# Patient Record
Sex: Male | Born: 1960 | Race: White | Hispanic: No | Smoking: Current every day smoker
Health system: Southern US, Community
[De-identification: ages and names within clinical notes are randomized; demographics above are authoritative.]

## PROBLEM LIST (undated history)

## (undated) DIAGNOSIS — F419 Anxiety disorder, unspecified: Secondary | ICD-10-CM

## (undated) DIAGNOSIS — R519 Headache, unspecified: Secondary | ICD-10-CM

## (undated) DIAGNOSIS — R030 Elevated blood-pressure reading, without diagnosis of hypertension: Secondary | ICD-10-CM

## (undated) DIAGNOSIS — E7800 Pure hypercholesterolemia, unspecified: Secondary | ICD-10-CM

## (undated) DIAGNOSIS — F319 Bipolar disorder, unspecified: Secondary | ICD-10-CM

## (undated) DIAGNOSIS — M25559 Pain in unspecified hip: Secondary | ICD-10-CM

## (undated) DIAGNOSIS — K219 Gastro-esophageal reflux disease without esophagitis: Secondary | ICD-10-CM

## (undated) DIAGNOSIS — I639 Cerebral infarction, unspecified: Secondary | ICD-10-CM

## (undated) DIAGNOSIS — F418 Other specified anxiety disorders: Secondary | ICD-10-CM

## (undated) DIAGNOSIS — M549 Dorsalgia, unspecified: Secondary | ICD-10-CM

## (undated) DIAGNOSIS — Z720 Tobacco use: Secondary | ICD-10-CM

## (undated) DIAGNOSIS — R51 Headache: Secondary | ICD-10-CM

## (undated) DIAGNOSIS — J449 Chronic obstructive pulmonary disease, unspecified: Secondary | ICD-10-CM

## (undated) DIAGNOSIS — G8929 Other chronic pain: Secondary | ICD-10-CM

## (undated) HISTORY — PX: ANTERIOR FUSION CERVICAL SPINE: SUR626

## (undated) HISTORY — DX: Gastro-esophageal reflux disease without esophagitis: K21.9

## (undated) HISTORY — DX: Bipolar disorder, unspecified: F31.9

## (undated) HISTORY — PX: LUMBAR SPINE SURGERY: SHX701

## (undated) HISTORY — PX: BACK SURGERY: SHX140

## (undated) HISTORY — PX: EYE SURGERY: SHX253

## (undated) HISTORY — DX: Cerebral infarction, unspecified: I63.9

## (undated) HISTORY — DX: Chronic obstructive pulmonary disease, unspecified: J44.9

## (undated) HISTORY — DX: Pain in unspecified hip: M25.559

## (undated) HISTORY — PX: CORONARY ANGIOPLASTY WITH STENT PLACEMENT: SHX49

## (undated) HISTORY — DX: Pure hypercholesterolemia, unspecified: E78.00

## (undated) HISTORY — DX: Other specified anxiety disorders: F41.8

## (undated) HISTORY — PX: CHOLECYSTECTOMY: SHX55

## (undated) HISTORY — DX: Dorsalgia, unspecified: M54.9

## (undated) HISTORY — DX: Elevated blood-pressure reading, without diagnosis of hypertension: R03.0

---

## 1997-11-10 ENCOUNTER — Inpatient Hospital Stay (HOSPITAL_COMMUNITY): Admission: AD | Admit: 1997-11-10 | Discharge: 1997-11-11 | Payer: Self-pay | Admitting: Cardiology

## 2000-10-15 ENCOUNTER — Emergency Department (HOSPITAL_COMMUNITY): Admission: EM | Admit: 2000-10-15 | Discharge: 2000-10-16 | Payer: Self-pay | Admitting: *Deleted

## 2000-10-16 ENCOUNTER — Encounter: Payer: Self-pay | Admitting: *Deleted

## 2001-01-07 ENCOUNTER — Encounter: Payer: Self-pay | Admitting: General Surgery

## 2001-01-07 ENCOUNTER — Ambulatory Visit (HOSPITAL_COMMUNITY): Admission: RE | Admit: 2001-01-07 | Discharge: 2001-01-07 | Payer: Self-pay | Admitting: General Surgery

## 2001-01-14 ENCOUNTER — Observation Stay (HOSPITAL_COMMUNITY): Admission: RE | Admit: 2001-01-14 | Discharge: 2001-01-15 | Payer: Self-pay | Admitting: General Surgery

## 2001-05-24 ENCOUNTER — Encounter: Payer: Self-pay | Admitting: *Deleted

## 2001-05-24 ENCOUNTER — Emergency Department (HOSPITAL_COMMUNITY): Admission: EM | Admit: 2001-05-24 | Discharge: 2001-05-24 | Payer: Self-pay | Admitting: *Deleted

## 2001-12-10 ENCOUNTER — Inpatient Hospital Stay (HOSPITAL_COMMUNITY): Admission: AC | Admit: 2001-12-10 | Discharge: 2001-12-11 | Payer: Self-pay

## 2001-12-10 ENCOUNTER — Encounter: Payer: Self-pay | Admitting: Surgery

## 2001-12-11 ENCOUNTER — Encounter: Payer: Self-pay | Admitting: Neurosurgery

## 2002-05-17 ENCOUNTER — Emergency Department (HOSPITAL_COMMUNITY): Admission: EM | Admit: 2002-05-17 | Discharge: 2002-05-17 | Payer: Self-pay | Admitting: Emergency Medicine

## 2002-05-17 ENCOUNTER — Encounter: Payer: Self-pay | Admitting: Emergency Medicine

## 2002-05-31 ENCOUNTER — Emergency Department (HOSPITAL_COMMUNITY): Admission: EM | Admit: 2002-05-31 | Discharge: 2002-05-31 | Payer: Self-pay | Admitting: Emergency Medicine

## 2002-10-04 ENCOUNTER — Emergency Department (HOSPITAL_COMMUNITY): Admission: EM | Admit: 2002-10-04 | Discharge: 2002-10-04 | Payer: Self-pay | Admitting: Emergency Medicine

## 2002-10-04 ENCOUNTER — Encounter: Payer: Self-pay | Admitting: Emergency Medicine

## 2003-05-31 ENCOUNTER — Encounter (HOSPITAL_COMMUNITY): Admission: RE | Admit: 2003-05-31 | Discharge: 2003-06-30 | Payer: Self-pay | Admitting: Preventative Medicine

## 2005-01-10 ENCOUNTER — Emergency Department (HOSPITAL_COMMUNITY): Admission: EM | Admit: 2005-01-10 | Discharge: 2005-01-10 | Payer: Self-pay | Admitting: Emergency Medicine

## 2005-01-15 ENCOUNTER — Ambulatory Visit (HOSPITAL_COMMUNITY): Admission: RE | Admit: 2005-01-15 | Discharge: 2005-01-15 | Payer: Self-pay | Admitting: Family Medicine

## 2005-02-07 ENCOUNTER — Inpatient Hospital Stay (HOSPITAL_COMMUNITY): Admission: RE | Admit: 2005-02-07 | Discharge: 2005-02-08 | Payer: Self-pay | Admitting: Neurosurgery

## 2005-04-09 ENCOUNTER — Encounter (HOSPITAL_COMMUNITY): Admission: RE | Admit: 2005-04-09 | Discharge: 2005-05-09 | Payer: Self-pay | Admitting: Neurosurgery

## 2005-05-01 ENCOUNTER — Emergency Department (HOSPITAL_COMMUNITY): Admission: EM | Admit: 2005-05-01 | Discharge: 2005-05-01 | Payer: Self-pay | Admitting: Emergency Medicine

## 2005-05-10 ENCOUNTER — Encounter (HOSPITAL_COMMUNITY): Admission: RE | Admit: 2005-05-10 | Discharge: 2005-06-09 | Payer: Self-pay | Admitting: Neurosurgery

## 2005-07-10 ENCOUNTER — Encounter: Payer: Self-pay | Admitting: Surgery

## 2007-02-16 ENCOUNTER — Ambulatory Visit (HOSPITAL_COMMUNITY): Admission: RE | Admit: 2007-02-16 | Discharge: 2007-02-16 | Payer: Self-pay | Admitting: Family Medicine

## 2007-03-04 ENCOUNTER — Ambulatory Visit (HOSPITAL_COMMUNITY): Admission: RE | Admit: 2007-03-04 | Discharge: 2007-03-04 | Payer: Self-pay | Admitting: Family Medicine

## 2007-03-20 ENCOUNTER — Ambulatory Visit (HOSPITAL_COMMUNITY): Admission: RE | Admit: 2007-03-20 | Discharge: 2007-03-20 | Payer: Self-pay | Admitting: Pulmonary Disease

## 2007-11-10 ENCOUNTER — Ambulatory Visit (HOSPITAL_COMMUNITY): Admission: RE | Admit: 2007-11-10 | Discharge: 2007-11-10 | Payer: Self-pay | Admitting: Internal Medicine

## 2007-11-15 ENCOUNTER — Emergency Department (HOSPITAL_COMMUNITY): Admission: EM | Admit: 2007-11-15 | Discharge: 2007-11-15 | Payer: Self-pay | Admitting: Emergency Medicine

## 2009-01-09 ENCOUNTER — Inpatient Hospital Stay (HOSPITAL_COMMUNITY): Admission: EM | Admit: 2009-01-09 | Discharge: 2009-01-10 | Payer: Self-pay | Admitting: Emergency Medicine

## 2009-01-22 ENCOUNTER — Inpatient Hospital Stay (HOSPITAL_COMMUNITY): Admission: EM | Admit: 2009-01-22 | Discharge: 2009-01-23 | Payer: Self-pay | Admitting: Emergency Medicine

## 2009-01-22 ENCOUNTER — Encounter: Payer: Self-pay | Admitting: Emergency Medicine

## 2009-01-23 ENCOUNTER — Encounter (INDEPENDENT_AMBULATORY_CARE_PROVIDER_SITE_OTHER): Payer: Self-pay | Admitting: Neurology

## 2009-09-22 ENCOUNTER — Ambulatory Visit (HOSPITAL_COMMUNITY): Admission: RE | Admit: 2009-09-22 | Discharge: 2009-09-22 | Payer: Self-pay | Admitting: Internal Medicine

## 2009-11-11 ENCOUNTER — Encounter: Payer: Self-pay | Admitting: Emergency Medicine

## 2009-11-11 ENCOUNTER — Inpatient Hospital Stay (HOSPITAL_COMMUNITY): Admission: EM | Admit: 2009-11-11 | Discharge: 2009-11-13 | Payer: Self-pay | Admitting: Emergency Medicine

## 2009-11-13 ENCOUNTER — Ambulatory Visit: Payer: Self-pay | Admitting: Psychiatry

## 2009-11-13 ENCOUNTER — Inpatient Hospital Stay (HOSPITAL_COMMUNITY): Admission: EM | Admit: 2009-11-13 | Discharge: 2009-11-15 | Payer: Self-pay | Admitting: Psychiatry

## 2010-03-06 ENCOUNTER — Encounter
Admission: RE | Admit: 2010-03-06 | Discharge: 2010-03-06 | Payer: Self-pay | Source: Home / Self Care | Attending: Neurosurgery | Admitting: Neurosurgery

## 2010-05-24 LAB — BASIC METABOLIC PANEL
BUN: 6 mg/dL (ref 6–23)
CO2: 25 mEq/L (ref 19–32)
Calcium: 9 mg/dL (ref 8.4–10.5)
Chloride: 105 mEq/L (ref 96–112)
Creatinine, Ser: 0.92 mg/dL (ref 0.4–1.5)
GFR calc Af Amer: >60 mL/min (ref >60)
GFR calc non Af Amer: >60 mL/min (ref >60)
Glucose, Bld: 90 mg/dL (ref 70–99)
Potassium: 3.4 mEq/L — ABNORMAL LOW (ref 3.5–5.1)
Sodium: 137 mEq/L (ref 135–145)

## 2010-05-24 LAB — URINALYSIS, ROUTINE W REFLEX MICROSCOPIC
Bilirubin Urine: NEGATIVE
Glucose, UA: NEGATIVE mg/dL
Hgb urine dipstick: NEGATIVE
Ketones, ur: NEGATIVE mg/dL
Nitrite: NEGATIVE
Protein, ur: NEGATIVE mg/dL
Specific Gravity, Urine: <1.005 — ABNORMAL LOW (ref 1.005–1.030)
Urobilinogen, UA: 0.2 mg/dL (ref 0.0–1.0)
pH: 6.5 (ref 5.0–8.0)

## 2010-05-24 LAB — DIFFERENTIAL
Basophils Absolute: 0.1 10*3/uL (ref 0.0–0.1)
Basophils Relative: 1 % (ref 0–1)
Eosinophils Absolute: 0.2 10*3/uL (ref 0.0–0.7)
Eosinophils Relative: 1 % (ref 0–5)
Lymphocytes Relative: 22 % (ref 12–46)
Lymphs Abs: 3.3 10*3/uL (ref 0.7–4.0)
Monocytes Absolute: 0.9 10*3/uL (ref 0.1–1.0)
Monocytes Relative: 6 % (ref 3–12)
Neutro Abs: 10.4 10*3/uL — ABNORMAL HIGH (ref 1.7–7.7)
Neutrophils Relative %: 70 % (ref 43–77)

## 2010-05-24 LAB — CROSSMATCH
ABO/RH(D): A POS
Antibody Screen: NEGATIVE

## 2010-05-24 LAB — GLUCOSE, CAPILLARY: Glucose-Capillary: 187 mg/dL — ABNORMAL HIGH (ref 70–99)

## 2010-05-24 LAB — CBC
HCT: 43.1 % (ref 39.0–52.0)
Hemoglobin: 14.9 g/dL (ref 13.0–17.0)
MCH: 30 pg (ref 26.0–34.0)
MCHC: 34.5 g/dL (ref 30.0–36.0)
MCV: 86.8 fL (ref 78.0–100.0)
Platelets: 283 10*3/uL (ref 150–400)
RBC: 4.96 MIL/uL (ref 4.22–5.81)
RDW: 13.8 % (ref 11.5–15.5)
WBC: 14.9 10*3/uL — ABNORMAL HIGH (ref 4.0–10.5)

## 2010-05-24 LAB — ABO/RH: ABO/RH(D): A POS

## 2010-06-13 LAB — GLUCOSE, CAPILLARY
Glucose-Capillary: 106 mg/dL — ABNORMAL HIGH (ref 70–99)
Glucose-Capillary: 109 mg/dL — ABNORMAL HIGH (ref 70–99)
Glucose-Capillary: 109 mg/dL — ABNORMAL HIGH (ref 70–99)
Glucose-Capillary: 131 mg/dL — ABNORMAL HIGH (ref 70–99)
Glucose-Capillary: 142 mg/dL — ABNORMAL HIGH (ref 70–99)
Glucose-Capillary: 333 mg/dL — ABNORMAL HIGH (ref 70–99)
Glucose-Capillary: 91 mg/dL (ref 70–99)
Glucose-Capillary: 91 mg/dL (ref 70–99)
Glucose-Capillary: 101 mg/dL — ABNORMAL HIGH (ref 70–99)
Glucose-Capillary: 139 mg/dL — ABNORMAL HIGH (ref 70–99)
Glucose-Capillary: 76 mg/dL (ref 70–99)
Glucose-Capillary: 82 mg/dL (ref 70–99)
Glucose-Capillary: 96 mg/dL (ref 70–99)

## 2010-06-13 LAB — DIFFERENTIAL
Basophils Absolute: 0 10*3/uL (ref 0.0–0.1)
Basophils Absolute: 0 10*3/uL (ref 0.0–0.1)
Basophils Absolute: 0 10*3/uL (ref 0.0–0.1)
Basophils Absolute: 0.1 10*3/uL (ref 0.0–0.1)
Basophils Absolute: 0.2 10*3/uL — ABNORMAL HIGH (ref 0.0–0.1)
Basophils Relative: 0 % (ref 0–1)
Basophils Relative: 0 % (ref 0–1)
Basophils Relative: 0 % (ref 0–1)
Basophils Relative: 0 % (ref 0–1)
Basophils Relative: 1 % (ref 0–1)
Eosinophils Absolute: 0.1 10*3/uL (ref 0.0–0.7)
Eosinophils Absolute: 0.1 10*3/uL (ref 0.0–0.7)
Eosinophils Absolute: 0.2 10*3/uL (ref 0.0–0.7)
Eosinophils Absolute: 0.3 10*3/uL (ref 0.0–0.7)
Eosinophils Absolute: 0.1 10*3/uL (ref 0.0–0.7)
Eosinophils Relative: 0 % (ref 0–5)
Eosinophils Relative: 1 % (ref 0–5)
Eosinophils Relative: 1 % (ref 0–5)
Eosinophils Relative: 2 % (ref 0–5)
Eosinophils Relative: 1 % (ref 0–5)
Lymphocytes Relative: 13 % (ref 12–46)
Lymphocytes Relative: 15 % (ref 12–46)
Lymphocytes Relative: 19 % (ref 12–46)
Lymphocytes Relative: 25 % (ref 12–46)
Lymphocytes Relative: 30 % (ref 12–46)
Lymphs Abs: 2.7 10*3/uL (ref 0.7–4.0)
Lymphs Abs: 3.3 10*3/uL (ref 0.7–4.0)
Lymphs Abs: 3.3 10*3/uL (ref 0.7–4.0)
Lymphs Abs: 3.8 10*3/uL (ref 0.7–4.0)
Lymphs Abs: 5 10*3/uL — ABNORMAL HIGH (ref 0.7–4.0)
Monocytes Absolute: 0.5 10*3/uL (ref 0.1–1.0)
Monocytes Absolute: 0.7 10*3/uL (ref 0.1–1.0)
Monocytes Absolute: 1.1 10*3/uL — ABNORMAL HIGH (ref 0.1–1.0)
Monocytes Absolute: 1.1 10*3/uL — ABNORMAL HIGH (ref 0.1–1.0)
Monocytes Absolute: 0.7 10*3/uL (ref 0.1–1.0)
Monocytes Relative: 3 % (ref 3–12)
Monocytes Relative: 3 % (ref 3–12)
Monocytes Relative: 5 % (ref 3–12)
Monocytes Relative: 6 % (ref 3–12)
Monocytes Relative: 4 % (ref 3–12)
Neutro Abs: 10.9 10*3/uL — ABNORMAL HIGH (ref 1.7–7.7)
Neutro Abs: 12.6 10*3/uL — ABNORMAL HIGH (ref 1.7–7.7)
Neutro Abs: 16.7 10*3/uL — ABNORMAL HIGH (ref 1.7–7.7)
Neutro Abs: 17.3 10*3/uL — ABNORMAL HIGH (ref 1.7–7.7)
Neutro Abs: 10.7 10*3/uL — ABNORMAL HIGH (ref 1.7–7.7)
Neutrophils Relative %: 71 % (ref 43–77)
Neutrophils Relative %: 73 % (ref 43–77)
Neutrophils Relative %: 81 % — ABNORMAL HIGH (ref 43–77)
Neutrophils Relative %: 81 % — ABNORMAL HIGH (ref 43–77)
Neutrophils Relative %: 64 % (ref 43–77)

## 2010-06-13 LAB — BASIC METABOLIC PANEL
BUN: 6 mg/dL (ref 6–23)
BUN: 8 mg/dL (ref 6–23)
BUN: 8 mg/dL (ref 6–23)
BUN: 9 mg/dL (ref 6–23)
CO2: 23 mEq/L (ref 19–32)
CO2: 23 mEq/L (ref 19–32)
CO2: 24 mEq/L (ref 19–32)
CO2: 25 mEq/L (ref 19–32)
Calcium: 8.5 mg/dL (ref 8.4–10.5)
Calcium: 8.6 mg/dL (ref 8.4–10.5)
Calcium: 9 mg/dL (ref 8.4–10.5)
Calcium: 9.1 mg/dL (ref 8.4–10.5)
Chloride: 103 mEq/L (ref 96–112)
Chloride: 103 mEq/L (ref 96–112)
Chloride: 104 mEq/L (ref 96–112)
Chloride: 109 mEq/L (ref 96–112)
Creatinine, Ser: 0.77 mg/dL (ref 0.4–1.5)
Creatinine, Ser: 0.78 mg/dL (ref 0.4–1.5)
Creatinine, Ser: 0.83 mg/dL (ref 0.4–1.5)
Creatinine, Ser: 0.87 mg/dL (ref 0.4–1.5)
GFR calc Af Amer: >60 mL/min (ref >60)
GFR calc Af Amer: >60 mL/min (ref >60)
GFR calc Af Amer: >60 mL/min (ref >60)
GFR calc Af Amer: >60 mL/min (ref >60)
GFR calc non Af Amer: >60 mL/min (ref >60)
GFR calc non Af Amer: >60 mL/min (ref >60)
GFR calc non Af Amer: >60 mL/min (ref >60)
GFR calc non Af Amer: >60 mL/min (ref >60)
Glucose, Bld: 124 mg/dL — ABNORMAL HIGH (ref 70–99)
Glucose, Bld: 127 mg/dL — ABNORMAL HIGH (ref 70–99)
Glucose, Bld: 156 mg/dL — ABNORMAL HIGH (ref 70–99)
Glucose, Bld: 81 mg/dL (ref 70–99)
Potassium: 3 mEq/L — ABNORMAL LOW (ref 3.5–5.1)
Potassium: 3.2 mEq/L — ABNORMAL LOW (ref 3.5–5.1)
Potassium: 3.4 mEq/L — ABNORMAL LOW (ref 3.5–5.1)
Potassium: 3.6 mEq/L (ref 3.5–5.1)
Sodium: 133 mEq/L — ABNORMAL LOW (ref 135–145)
Sodium: 136 mEq/L (ref 135–145)
Sodium: 137 mEq/L (ref 135–145)
Sodium: 138 mEq/L (ref 135–145)

## 2010-06-13 LAB — CBC
HCT: 42.1 % (ref 39.0–52.0)
HCT: 44.3 % (ref 39.0–52.0)
HCT: 44.8 % (ref 39.0–52.0)
HCT: 48.6 % (ref 39.0–52.0)
HCT: 44.7 % (ref 39.0–52.0)
Hemoglobin: 14.4 g/dL (ref 13.0–17.0)
Hemoglobin: 15.4 g/dL (ref 13.0–17.0)
Hemoglobin: 15.5 g/dL (ref 13.0–17.0)
Hemoglobin: 16.9 g/dL (ref 13.0–17.0)
Hemoglobin: 15.6 g/dL (ref 13.0–17.0)
MCHC: 34.2 g/dL (ref 30.0–36.0)
MCHC: 34.5 g/dL (ref 30.0–36.0)
MCHC: 34.8 g/dL (ref 30.0–36.0)
MCHC: 35 g/dL (ref 30.0–36.0)
MCHC: 34.8 g/dL (ref 30.0–36.0)
MCV: 86.3 fL (ref 78.0–100.0)
MCV: 86.6 fL (ref 78.0–100.0)
MCV: 87.1 fL (ref 78.0–100.0)
MCV: 87.8 fL (ref 78.0–100.0)
MCV: 87.1 fL (ref 78.0–100.0)
Platelets: 243 10*3/uL (ref 150–400)
Platelets: 248 10*3/uL (ref 150–400)
Platelets: 281 10*3/uL (ref 150–400)
Platelets: 321 10*3/uL (ref 150–400)
Platelets: 311 10*3/uL (ref 150–400)
RBC: 4.8 MIL/uL (ref 4.22–5.81)
RBC: 5.11 MIL/uL (ref 4.22–5.81)
RBC: 5.19 MIL/uL (ref 4.22–5.81)
RBC: 5.58 MIL/uL (ref 4.22–5.81)
RBC: 5.14 MIL/uL (ref 4.22–5.81)
RDW: 13.4 % (ref 11.5–15.5)
RDW: 13.7 % (ref 11.5–15.5)
RDW: 13.7 % (ref 11.5–15.5)
RDW: 13.7 % (ref 11.5–15.5)
RDW: 13.3 % (ref 11.5–15.5)
WBC: 15.4 10*3/uL — ABNORMAL HIGH (ref 4.0–10.5)
WBC: 17.3 10*3/uL — ABNORMAL HIGH (ref 4.0–10.5)
WBC: 20.7 10*3/uL — ABNORMAL HIGH (ref 4.0–10.5)
WBC: 21.4 10*3/uL — ABNORMAL HIGH (ref 4.0–10.5)
WBC: 16.8 10*3/uL — ABNORMAL HIGH (ref 4.0–10.5)

## 2010-06-13 LAB — COMPREHENSIVE METABOLIC PANEL
ALT: 33 U/L (ref 0–53)
AST: 28 U/L (ref 0–37)
Albumin: 3.6 g/dL (ref 3.5–5.2)
Alkaline Phosphatase: 73 U/L (ref 39–117)
BUN: 5 mg/dL — ABNORMAL LOW (ref 6–23)
CO2: 25 mEq/L (ref 19–32)
Calcium: 8.8 mg/dL (ref 8.4–10.5)
Chloride: 107 mEq/L (ref 96–112)
Creatinine, Ser: 0.74 mg/dL (ref 0.4–1.5)
GFR calc Af Amer: >60 mL/min (ref >60)
GFR calc non Af Amer: >60 mL/min (ref >60)
Glucose, Bld: 81 mg/dL (ref 70–99)
Potassium: 3.9 mEq/L (ref 3.5–5.1)
Sodium: 141 mEq/L (ref 135–145)
Total Bilirubin: 0.3 mg/dL (ref 0.3–1.2)
Total Protein: 7 g/dL (ref 6.0–8.3)

## 2010-06-13 LAB — HEMOGLOBIN A1C
Hgb A1c MFr Bld: 5.7 % (ref 4.6–6.1)
Hgb A1c MFr Bld: 6 % (ref 4.6–6.1)
Mean Plasma Glucose: 117 mg/dL
Mean Plasma Glucose: 126 mg/dL

## 2010-06-13 LAB — URINALYSIS, ROUTINE W REFLEX MICROSCOPIC
Bilirubin Urine: NEGATIVE
Glucose, UA: NEGATIVE mg/dL
Hgb urine dipstick: NEGATIVE
Ketones, ur: NEGATIVE mg/dL
Nitrite: NEGATIVE
Protein, ur: NEGATIVE mg/dL
Specific Gravity, Urine: 1.006 (ref 1.005–1.030)
Urobilinogen, UA: 0.2 mg/dL (ref 0.0–1.0)
pH: 7 (ref 5.0–8.0)

## 2010-06-13 LAB — PROTIME-INR
INR: 0.95 (ref 0.00–1.49)
INR: 1.04 (ref 0.00–1.49)
Prothrombin Time: 12.9 seconds (ref 11.6–15.2)
Prothrombin Time: 13.5 seconds (ref 11.6–15.2)

## 2010-06-13 LAB — APTT
aPTT: 28 seconds (ref 24–37)
aPTT: 28 seconds (ref 24–37)

## 2010-06-13 LAB — CULTURE, BLOOD (ROUTINE X 2)
Culture: NO GROWTH
Culture: NO GROWTH
Report Status: 11062010
Report Status: 11062010

## 2010-06-13 LAB — POCT CARDIAC MARKERS
CKMB, poc: 1.3 ng/mL (ref 1.0–8.0)
Myoglobin, poc: 70.6 ng/mL (ref 12–200)
Troponin i, poc: <0.05 ng/mL (ref 0.00–0.09)

## 2010-06-13 LAB — LIPID PANEL
Cholesterol: 150 mg/dL (ref 0–200)
HDL: 20 mg/dL — ABNORMAL LOW (ref >39)
LDL Cholesterol: UNDETERMINED mg/dL (ref 0–99)
Total CHOL/HDL Ratio: 7.5 RATIO
Triglycerides: 423 mg/dL — ABNORMAL HIGH (ref <150)
VLDL: UNDETERMINED mg/dL (ref 0–40)

## 2010-06-13 LAB — CK TOTAL AND CKMB (NOT AT ARMC)
CK, MB: 2.2 ng/mL (ref 0.3–4.0)
Relative Index: 2 (ref 0.0–2.5)
Total CK: 112 U/L (ref 7–232)

## 2010-06-13 LAB — TROPONIN I: Troponin I: 0.02 ng/mL (ref 0.00–0.06)

## 2010-06-13 LAB — TSH: TSH: 1.288 u[IU]/mL (ref 0.350–4.500)

## 2010-06-13 LAB — HOMOCYSTEINE: Homocysteine: 10.6 umol/L (ref 4.0–15.4)

## 2010-06-13 LAB — MRSA PCR SCREENING: MRSA by PCR: NEGATIVE

## 2010-07-04 ENCOUNTER — Ambulatory Visit
Admission: RE | Admit: 2010-07-04 | Discharge: 2010-07-04 | Disposition: A | Discharge status: Home / Self Care | Payer: Medicaid Other | Source: Ambulatory Visit | Attending: Neurosurgery | Admitting: Neurosurgery

## 2010-07-04 ENCOUNTER — Other Ambulatory Visit: Payer: Self-pay | Admitting: Neurosurgery

## 2010-07-04 DIAGNOSIS — D72829 Elevated white blood cell count, unspecified: Secondary | ICD-10-CM

## 2010-07-27 NOTE — Procedures (Signed)
NAMEMarland Kitchen  [name], Raymond [name]                   ACCOUNT NO.:  [id number]   MEDICAL RECORD NO.:  [id number]          PATIENT TYPE:  OUT   LOCATION:  RESP                          FACILITY:  [facility]   PHYSICIAN:  Edward L. Juanetta Gosling, M.D.DATE OF BIRTH:  April 15, 1960   DATE OF PROCEDURE:  DATE OF DISCHARGE:  03/20/2007                            PULMONARY FUNCTION TEST   1. Spirometry shows a mild ventilatory defect with evidence of airflow      obstruction.  2. Lung volumes show no restrictive change, but very mild air      trapping.  3. DLCO is mildly reduced.  4. Arterial blood gases show resting hypoxemia for age; otherwise      normal.  5. There is no bronchodilator improvement.      Edward L. Juanetta Gosling, M.D.  Electronically Signed     ELH/MEDQ  D:  03/26/2007  T:  03/26/2007  Job:  [id number]

## 2010-11-28 LAB — BLOOD GAS, ARTERIAL
Acid-base deficit: 0.1
Bicarbonate: 23.7
FIO2: 0.21
O2 Saturation: 94.9
Patient temperature: 37
TCO2: 20.2
pCO2 arterial: 36.5
pH, Arterial: 7.429
pO2, Arterial: 67.5 — ABNORMAL LOW

## 2010-12-08 ENCOUNTER — Other Ambulatory Visit: Payer: Self-pay | Admitting: Diagnostic Radiology

## 2012-05-11 DIAGNOSIS — M4310 Spondylolisthesis, site unspecified: Secondary | ICD-10-CM | POA: Diagnosis not present

## 2012-05-11 DIAGNOSIS — M4716 Other spondylosis with myelopathy, lumbar region: Secondary | ICD-10-CM | POA: Diagnosis not present

## 2012-05-11 DIAGNOSIS — Z981 Arthrodesis status: Secondary | ICD-10-CM | POA: Diagnosis not present

## 2012-05-11 DIAGNOSIS — M5126 Other intervertebral disc displacement, lumbar region: Secondary | ICD-10-CM | POA: Diagnosis not present

## 2012-05-27 DIAGNOSIS — M4716 Other spondylosis with myelopathy, lumbar region: Secondary | ICD-10-CM | POA: Diagnosis not present

## 2012-05-27 DIAGNOSIS — M519 Unspecified thoracic, thoracolumbar and lumbosacral intervertebral disc disorder: Secondary | ICD-10-CM | POA: Diagnosis not present

## 2012-06-03 DIAGNOSIS — J209 Acute bronchitis, unspecified: Secondary | ICD-10-CM | POA: Diagnosis not present

## 2012-06-03 DIAGNOSIS — Z719 Counseling, unspecified: Secondary | ICD-10-CM | POA: Diagnosis not present

## 2012-06-03 DIAGNOSIS — Z6830 Body mass index (BMI) 30.0-30.9, adult: Secondary | ICD-10-CM | POA: Diagnosis not present

## 2012-06-03 DIAGNOSIS — G8929 Other chronic pain: Secondary | ICD-10-CM | POA: Diagnosis not present

## 2012-06-03 DIAGNOSIS — M159 Polyosteoarthritis, unspecified: Secondary | ICD-10-CM | POA: Diagnosis not present

## 2012-06-05 DIAGNOSIS — F3189 Other bipolar disorder: Secondary | ICD-10-CM | POA: Diagnosis not present

## 2012-06-15 DIAGNOSIS — M4716 Other spondylosis with myelopathy, lumbar region: Secondary | ICD-10-CM | POA: Diagnosis not present

## 2012-06-15 DIAGNOSIS — M519 Unspecified thoracic, thoracolumbar and lumbosacral intervertebral disc disorder: Secondary | ICD-10-CM | POA: Diagnosis not present

## 2012-06-15 DIAGNOSIS — M76899 Other specified enthesopathies of unspecified lower limb, excluding foot: Secondary | ICD-10-CM | POA: Diagnosis not present

## 2012-06-19 DIAGNOSIS — F17200 Nicotine dependence, unspecified, uncomplicated: Secondary | ICD-10-CM | POA: Diagnosis not present

## 2012-06-19 DIAGNOSIS — Z79899 Other long term (current) drug therapy: Secondary | ICD-10-CM | POA: Diagnosis not present

## 2012-06-19 DIAGNOSIS — M76899 Other specified enthesopathies of unspecified lower limb, excluding foot: Secondary | ICD-10-CM | POA: Diagnosis not present

## 2012-06-19 DIAGNOSIS — M4716 Other spondylosis with myelopathy, lumbar region: Secondary | ICD-10-CM | POA: Diagnosis not present

## 2012-06-19 DIAGNOSIS — G8929 Other chronic pain: Secondary | ICD-10-CM | POA: Diagnosis not present

## 2012-06-19 DIAGNOSIS — M519 Unspecified thoracic, thoracolumbar and lumbosacral intervertebral disc disorder: Secondary | ICD-10-CM | POA: Diagnosis not present

## 2012-06-19 DIAGNOSIS — Z981 Arthrodesis status: Secondary | ICD-10-CM | POA: Diagnosis not present

## 2012-06-19 DIAGNOSIS — J449 Chronic obstructive pulmonary disease, unspecified: Secondary | ICD-10-CM | POA: Diagnosis not present

## 2012-06-19 DIAGNOSIS — Z885 Allergy status to narcotic agent status: Secondary | ICD-10-CM | POA: Diagnosis not present

## 2012-06-19 DIAGNOSIS — Z880 Allergy status to penicillin: Secondary | ICD-10-CM | POA: Diagnosis not present

## 2012-07-29 DIAGNOSIS — M4716 Other spondylosis with myelopathy, lumbar region: Secondary | ICD-10-CM | POA: Diagnosis not present

## 2012-07-29 DIAGNOSIS — M519 Unspecified thoracic, thoracolumbar and lumbosacral intervertebral disc disorder: Secondary | ICD-10-CM | POA: Diagnosis not present

## 2012-09-25 ENCOUNTER — Other Ambulatory Visit (HOSPITAL_COMMUNITY): Payer: Self-pay | Admitting: Internal Medicine

## 2012-09-25 DIAGNOSIS — F3189 Other bipolar disorder: Secondary | ICD-10-CM | POA: Diagnosis not present

## 2012-09-25 DIAGNOSIS — R1013 Epigastric pain: Secondary | ICD-10-CM | POA: Diagnosis not present

## 2012-09-25 DIAGNOSIS — Z6831 Body mass index (BMI) 31.0-31.9, adult: Secondary | ICD-10-CM | POA: Diagnosis not present

## 2012-09-25 DIAGNOSIS — R1011 Right upper quadrant pain: Secondary | ICD-10-CM | POA: Diagnosis not present

## 2012-09-25 DIAGNOSIS — F528 Other sexual dysfunction not due to a substance or known physiological condition: Secondary | ICD-10-CM | POA: Diagnosis not present

## 2012-09-25 DIAGNOSIS — F411 Generalized anxiety disorder: Secondary | ICD-10-CM | POA: Diagnosis not present

## 2012-09-25 LAB — COMPREHENSIVE METABOLIC PANEL
ALT: 22 U/L (ref 10–40)
AST: 27 U/L
Albumin: 4
Alkaline Phosphatase: 88 U/L
BUN: 12 mg/dL (ref 4–21)
Calcium: 8.9 mg/dL
Creat: 1.09
Glucose: 82 mg/dL
Potassium: 4.1 mmol/L
Sodium: 141 mmol/L (ref 137–147)
Total Bilirubin: 0.3 mg/dL

## 2012-09-25 LAB — CBC
Amylase: 24 units/L — AB (ref 25–110)
HCT: 44 %
HGB: 15.6 g/dL
Hemoglobin-A1c: <0.4
Lipase: 15 units/L (ref 0–53)
MCV: 86.1 fL
TSH: 0.99 u[IU]/mL (ref 0.41–5.90)
WBC: 15.3
platelet count: 349

## 2012-09-28 ENCOUNTER — Ambulatory Visit (HOSPITAL_COMMUNITY)
Admission: RE | Admit: 2012-09-28 | Discharge: 2012-09-28 | Disposition: A | Discharge status: Home / Self Care | Payer: Medicare Other | Source: Ambulatory Visit | Attending: Internal Medicine | Admitting: Internal Medicine

## 2012-09-28 DIAGNOSIS — R1013 Epigastric pain: Secondary | ICD-10-CM

## 2012-09-28 DIAGNOSIS — K7689 Other specified diseases of liver: Secondary | ICD-10-CM | POA: Insufficient documentation

## 2012-09-28 DIAGNOSIS — R109 Unspecified abdominal pain: Secondary | ICD-10-CM | POA: Insufficient documentation

## 2012-10-07 ENCOUNTER — Encounter: Payer: Self-pay | Admitting: Internal Medicine

## 2012-10-15 ENCOUNTER — Ambulatory Visit (INDEPENDENT_AMBULATORY_CARE_PROVIDER_SITE_OTHER): Payer: Medicare Other | Admitting: Gastroenterology

## 2012-10-15 ENCOUNTER — Encounter: Payer: Self-pay | Admitting: Gastroenterology

## 2012-10-15 VITALS — BP 108/68 | HR 80 | Temp 98.2°F | Ht 67.0 in | Wt 197.2 lb

## 2012-10-15 DIAGNOSIS — K219 Gastro-esophageal reflux disease without esophagitis: Secondary | ICD-10-CM

## 2012-10-15 DIAGNOSIS — R109 Unspecified abdominal pain: Secondary | ICD-10-CM | POA: Diagnosis not present

## 2012-10-15 MED ORDER — PEG 3350-KCL-NA BICARB-NACL 420 G PO SOLR: 4000.0000 mL | ORAL | Status: DC

## 2012-10-15 MED ORDER — OMEPRAZOLE 20 MG PO CPDR: 20.0000 mg | DELAYED_RELEASE_CAPSULE | Freq: Every day | ORAL | Status: DC

## 2012-10-15 NOTE — Patient Instructions (Addendum)
Start taking Prilosec each morning, 30 minutes before breakfast. I sent this to your pharmacy.  We have scheduled you for a colonoscopy and possible upper endoscopy with Dr. Jena Gauss in the near future.

## 2012-10-15 NOTE — Progress Notes (Signed)
 Primary Care Physician:  FUSCO,LAWRENCE J., MD Primary Gastroenterologist:  Dr. Rourk   Chief Complaint  Patient presents with  . Abdominal Pain    x 2 weeks    HPI:   [name] is a 52-year-old male presenting at the request of Dr. Fusco due to abdominal pain. Onset several weeks ago. Lower abdominal pain, sometimes moves, diffuse.  Almost like gas. Intermittent. Aggravated by food occasionally. Occasional nausea. No melena, hematochezia. Occasional constipation, occasional urgency and loose stool. Used to weigh 228 last year, now 197. Unintentional. Wife states "eats like a bird". GERD symptoms, Ranitidine BID. No dysphagia. No NSAIDs or aspirin powders. Baby aspirin daily.   No prior colonoscopy or upper endoscopy. Labs completed at PCP, but these are not available at the time of visit.   Past Medical History  Diagnosis Date  . GERD (gastroesophageal reflux disease)   . Borderline hypertension   . Hypercholesterolemia   . Depression with anxiety   . Bipolar disorder   . COPD (chronic obstructive pulmonary disease)   . Back pain     Past Surgical History  Procedure Laterality Date  . Cholecystectomy    . Eye surgery  Left eye    as a child  . Back surgery    . Anterior fusion cervical spine      Current Outpatient Prescriptions  Medication Sig Dispense Refill  . albuterol (PROVENTIL) (2.5 MG/3ML) 0.083% nebulizer solution       . albuterol (PROVENTIL) 2 MG tablet Take 2 mg by mouth 3 (three) times daily.      . Albuterol Sulfate (VENTOLIN HFA IN)       . allopurinol (ZYLOPRIM) 100 MG tablet       . aspirin 81 MG tablet Take 81 mg by mouth daily.      . cyclobenzaprine (FLEXERIL) 10 MG tablet       . diazepam (VALIUM) 5 MG tablet       . Diclofenac Sodium CR 100 MG 24 hr tablet       . fentaNYL (DURAGESIC - DOSED MCG/HR) 100 MCG/HR       . Gabapentin, PHN, 300 MG TABS       . oxyCODONE-acetaminophen (PERCOCET) 10-325 MG per tablet       . promethazine (PHENERGAN)  25 MG tablet Take 25 mg by mouth every 6 (six) hours as needed for nausea.      . ranitidine (ZANTAC) 150 MG tablet Take 150 mg by mouth 2 (two) times daily.      . SEROQUEL 50 MG tablet       . SPIRIVA HANDIHALER 18 MCG inhalation capsule       . sulfamethoxazole-trimethoprim (BACTRIM DS) 800-160 MG per tablet Take 1 tablet by mouth 2 (two) times daily.      . TRILEPTAL 150 MG tablet       . omeprazole (PRILOSEC) 20 MG capsule Take 1 capsule (20 mg total) by mouth daily.  30 capsule  3   No current facility-administered medications for this visit.    Allergies as of 10/15/2012 - Review Complete 07/09/2010  Allergen Reaction Noted  . Penicillins Anaphylaxis 07/09/2010  . Morphine and related  10/15/2012  . Vicodin (hydrocodone-acetaminophen)  10/15/2012    Family History  Problem Relation Age of Onset  . Colon cancer Neg Hx   . Throat cancer Father     History   Social History  . Marital Status: Married    Spouse Name: N/A      Number of Children: N/A  . Years of Education: N/A   Occupational History  . Not on file.   Social History Main Topics  . Smoking status: Current Every Day Smoker -- 3.00 packs/day for 30 years  . Smokeless tobacco: Not on file  . Alcohol Use: No     Comment: history of ETOH abuse in past, none in 4 years  . Drug Use: No  . Sexually Active: Not on file   Other Topics Concern  . Not on file   Social History Narrative  . No narrative on file    Review of Systems: Gen: Denies any fever, chills, fatigue, weight loss, lack of appetite.  CV: Denies chest pain, heart palpitations, peripheral edema, syncope.  Resp: COPD GI: see HPI GU : Denies urinary burning, urinary frequency, urinary hesitancy MS: back pain Derm: Denies rash, itching, dry skin Psych: history of depression/anxiety Heme: Denies bruising, bleeding, and enlarged lymph nodes.  Physical Exam: BP 108/68  Pulse 80  Temp(Src) 98.2 F (36.8 C) (Oral)  Ht 5' 7" (1.702 m)  Wt  197 lb 3.2 oz (89.449 kg)  BMI 30.88 kg/m2 General:   Alert and oriented. Pleasant and cooperative. Appears older than stated age.  Head:  Normocephalic and atraumatic. Eyes:  Without icterus, sclera clear and conjunctiva pink.  Ears:  Normal auditory acuity. Nose:  No deformity, discharge,  or lesions. Neck:  Supple, without mass or thyromegaly. Lungs:  Mild expiratory wheeze bilaterally Heart:  S1, S2 present without murmurs appreciated.  Abdomen:  +BS, soft, non-tender and non-distended. Query mild hepatomegaly. Large AP diameter.  Rectal:  Deferred  Msk:  Symmetrical without gross deformities. Normal posture. Extremities:  Without clubbing or edema. Neurologic:  Alert and  oriented x4;  grossly normal neurologically. Skin:  Intact without significant lesions or rashes. Cervical Nodes:  No significant cervical adenopathy. Psych:  Alert and cooperative. Normal mood and affect.   July 2014 US of abdomen Prior cholecystectomy.  Hepatic steatosis  Limited assessment of the pancreas because of obscuring bowel gas  Left kidney mid pole prominent dromedary hump.   

## 2012-10-16 ENCOUNTER — Telehealth: Payer: Self-pay | Admitting: Gastroenterology

## 2012-10-16 NOTE — Assessment & Plan Note (Signed)
Stop zantac.  Start Prilosec once daily. Possible EGD in near future.

## 2012-10-16 NOTE — Telephone Encounter (Signed)
As I was reviewing patient's chart and presentation, I would like to do the following:  Obtain CT angiogram next week (August 11th week). I want to rule out mesenteric ischemia before proceeding with TCS+/- EGD. He is scheduled with Dr. Jena Gauss for the 20th. We need to get this done several days prior to that to rule this out.   Needs BMP prior.

## 2012-10-16 NOTE — Assessment & Plan Note (Signed)
52 year old male with new onset abdominal pain several weeks ago. Described as "moving", starting in lower abdomen and "all over" at times. Occasionally associated with food, associated nausea. No signs of overt GI bleeding. Associated weight loss of around 30 lbs per patient due to decreased oral intake. No prior colonoscopy or upper endoscopy. [place] with fatty liver.   Unclear etiology, but I would like to rule out underlying mesenteric ischemia prior to proceeding with colonoscopy +/- EGD due to his weight loss, abdominal pain with eating, and overall vague symptoms. Would like to exclude this first, but he will definitely need lower and upper GI evaluation in near future as well.  CTA now Proceed with TCS+/- EGD with Dr. Jena Gauss in near future: the risks, benefits, and alternatives have been discussed with the patient in detail. The patient states understanding and desires to proceed. PHENERGAN 25 mg IV ON CALL due to polypharmacy Obtain outside labs Start Prilosec once each morning due to GERD symptoms and lack of PPI

## 2012-10-19 ENCOUNTER — Other Ambulatory Visit: Payer: Self-pay | Admitting: Gastroenterology

## 2012-10-19 ENCOUNTER — Encounter (HOSPITAL_COMMUNITY): Payer: Self-pay | Admitting: Pharmacy Technician

## 2012-10-19 DIAGNOSIS — R109 Unspecified abdominal pain: Secondary | ICD-10-CM

## 2012-10-19 NOTE — Telephone Encounter (Signed)
Patient is scheduled for CT A on Wednesday Aug 13 at 9:30

## 2012-10-19 NOTE — Progress Notes (Signed)
CC'd to PCP 

## 2012-10-20 ENCOUNTER — Other Ambulatory Visit: Payer: Self-pay | Admitting: Gastroenterology

## 2012-10-20 ENCOUNTER — Other Ambulatory Visit: Payer: Self-pay

## 2012-10-20 DIAGNOSIS — R109 Unspecified abdominal pain: Secondary | ICD-10-CM

## 2012-10-20 NOTE — Telephone Encounter (Signed)
Lab order has been faxed to the lab. Spoke with [place], she said she told him to go have his blood work done.

## 2012-10-21 ENCOUNTER — Ambulatory Visit (HOSPITAL_COMMUNITY)
Admission: RE | Admit: 2012-10-21 | Discharge: 2012-10-21 | Disposition: A | Discharge status: Home / Self Care | Payer: Medicare Other | Source: Ambulatory Visit | Attending: Gastroenterology | Admitting: Gastroenterology

## 2012-10-21 DIAGNOSIS — R109 Unspecified abdominal pain: Secondary | ICD-10-CM | POA: Insufficient documentation

## 2012-10-21 DIAGNOSIS — K7689 Other specified diseases of liver: Secondary | ICD-10-CM | POA: Diagnosis not present

## 2012-10-21 DIAGNOSIS — R634 Abnormal weight loss: Secondary | ICD-10-CM | POA: Insufficient documentation

## 2012-10-21 MED ORDER — IOHEXOL 350 MG/ML SOLN: 100.0000 mL | Freq: Once | INTRAVENOUS | Status: AC | PRN

## 2012-10-21 MED ORDER — IOHEXOL 300 MG/ML  SOLN
100.0000 mL | Freq: Once | INTRAMUSCULAR | Status: AC | PRN
  Administered 2012-10-21: 100 mL via INTRAVENOUS

## 2012-10-21 NOTE — Progress Notes (Signed)
Quick Note:  CT showed NO evidence of blocked blood vessels to the "gut". He has diffuse fatty liver.  Proceed with tcs/egd as planned. Save for Tobi Bastos to review. ______

## 2012-10-22 DIAGNOSIS — Z79899 Other long term (current) drug therapy: Secondary | ICD-10-CM | POA: Diagnosis not present

## 2012-10-28 ENCOUNTER — Encounter (HOSPITAL_COMMUNITY): Payer: Self-pay | Admitting: *Deleted

## 2012-10-28 ENCOUNTER — Ambulatory Visit (HOSPITAL_COMMUNITY)
Admission: RE | Admit: 2012-10-28 | Discharge: 2012-10-28 | Disposition: A | Discharge status: Home Health | Payer: Medicare Other | Source: Ambulatory Visit | Attending: Internal Medicine | Admitting: Internal Medicine

## 2012-10-28 ENCOUNTER — Encounter (HOSPITAL_COMMUNITY)
Admission: RE | Disposition: A | Discharge status: Home Health | Payer: Self-pay | Source: Ambulatory Visit | Attending: Internal Medicine

## 2012-10-28 DIAGNOSIS — Z7982 Long term (current) use of aspirin: Secondary | ICD-10-CM | POA: Insufficient documentation

## 2012-10-28 DIAGNOSIS — F17200 Nicotine dependence, unspecified, uncomplicated: Secondary | ICD-10-CM | POA: Diagnosis not present

## 2012-10-28 DIAGNOSIS — Z79899 Other long term (current) drug therapy: Secondary | ICD-10-CM | POA: Diagnosis not present

## 2012-10-28 DIAGNOSIS — Z1211 Encounter for screening for malignant neoplasm of colon: Secondary | ICD-10-CM | POA: Insufficient documentation

## 2012-10-28 DIAGNOSIS — K2960 Other gastritis without bleeding: Secondary | ICD-10-CM | POA: Diagnosis not present

## 2012-10-28 DIAGNOSIS — F319 Bipolar disorder, unspecified: Secondary | ICD-10-CM | POA: Insufficient documentation

## 2012-10-28 DIAGNOSIS — K7689 Other specified diseases of liver: Secondary | ICD-10-CM | POA: Diagnosis not present

## 2012-10-28 DIAGNOSIS — R109 Unspecified abdominal pain: Secondary | ICD-10-CM

## 2012-10-28 DIAGNOSIS — R030 Elevated blood-pressure reading, without diagnosis of hypertension: Secondary | ICD-10-CM | POA: Diagnosis not present

## 2012-10-28 DIAGNOSIS — K2940 Chronic atrophic gastritis without bleeding: Secondary | ICD-10-CM | POA: Insufficient documentation

## 2012-10-28 DIAGNOSIS — J449 Chronic obstructive pulmonary disease, unspecified: Secondary | ICD-10-CM | POA: Diagnosis not present

## 2012-10-28 DIAGNOSIS — Z9089 Acquired absence of other organs: Secondary | ICD-10-CM | POA: Diagnosis not present

## 2012-10-28 DIAGNOSIS — Z981 Arthrodesis status: Secondary | ICD-10-CM | POA: Diagnosis not present

## 2012-10-28 DIAGNOSIS — K620 Anal polyp: Secondary | ICD-10-CM | POA: Diagnosis not present

## 2012-10-28 DIAGNOSIS — D1779 Benign lipomatous neoplasm of other sites: Secondary | ICD-10-CM | POA: Diagnosis not present

## 2012-10-28 DIAGNOSIS — F341 Dysthymic disorder: Secondary | ICD-10-CM | POA: Insufficient documentation

## 2012-10-28 DIAGNOSIS — E7800 Pure hypercholesterolemia, unspecified: Secondary | ICD-10-CM | POA: Diagnosis not present

## 2012-10-28 DIAGNOSIS — J4489 Other specified chronic obstructive pulmonary disease: Secondary | ICD-10-CM | POA: Insufficient documentation

## 2012-10-28 DIAGNOSIS — K621 Rectal polyp: Secondary | ICD-10-CM

## 2012-10-28 DIAGNOSIS — K2970 Gastritis, unspecified, without bleeding: Secondary | ICD-10-CM | POA: Diagnosis not present

## 2012-10-28 DIAGNOSIS — K449 Diaphragmatic hernia without obstruction or gangrene: Secondary | ICD-10-CM | POA: Insufficient documentation

## 2012-10-28 DIAGNOSIS — K648 Other hemorrhoids: Secondary | ICD-10-CM

## 2012-10-28 DIAGNOSIS — D175 Benign lipomatous neoplasm of intra-abdominal organs: Secondary | ICD-10-CM | POA: Diagnosis not present

## 2012-10-28 DIAGNOSIS — K219 Gastro-esophageal reflux disease without esophagitis: Secondary | ICD-10-CM

## 2012-10-28 HISTORY — PX: COLONOSCOPY: SHX5424

## 2012-10-28 HISTORY — PX: ESOPHAGOGASTRODUODENOSCOPY: SHX5428

## 2012-10-28 SURGERY — COLONOSCOPY
Anesthesia: Moderate Sedation

## 2012-10-28 MED ORDER — MEPERIDINE HCL 100 MG/ML IJ SOLN
INTRAMUSCULAR | Status: DC | PRN
  Administered 2012-10-28 (×2): 25 mg via INTRAVENOUS
  Administered 2012-10-28: 50 mg via INTRAVENOUS

## 2012-10-28 MED ORDER — PROMETHAZINE HCL 25 MG/ML IJ SOLN
12.5000 mg | Freq: Once | INTRAMUSCULAR | Status: AC
  Administered 2012-10-28: 12.5 mg via INTRAVENOUS

## 2012-10-28 MED ORDER — PROMETHAZINE HCL 25 MG/ML IJ SOLN
INTRAMUSCULAR | Status: AC
  Filled 2012-10-28: qty 1

## 2012-10-28 MED ORDER — SODIUM CHLORIDE 0.9 % IJ SOLN
INTRAMUSCULAR | Status: AC
  Filled 2012-10-28: qty 10

## 2012-10-28 MED ORDER — ONDANSETRON HCL 4 MG/2ML IJ SOLN
INTRAMUSCULAR | Status: AC
  Filled 2012-10-28: qty 2

## 2012-10-28 MED ORDER — MIDAZOLAM HCL 5 MG/5ML IJ SOLN
INTRAMUSCULAR | Status: DC | PRN
  Administered 2012-10-28 (×2): 2 mg via INTRAVENOUS
  Administered 2012-10-28 (×3): 1 mg via INTRAVENOUS

## 2012-10-28 MED ORDER — MEPERIDINE HCL 100 MG/ML IJ SOLN
INTRAMUSCULAR | Status: AC
  Filled 2012-10-28: qty 2

## 2012-10-28 MED ORDER — MIDAZOLAM HCL 5 MG/5ML IJ SOLN
INTRAMUSCULAR | Status: AC
  Filled 2012-10-28: qty 10

## 2012-10-28 MED ORDER — BUTAMBEN-TETRACAINE-BENZOCAINE 2-2-14 % EX AERO
INHALATION_SPRAY | CUTANEOUS | Status: DC | PRN
  Administered 2012-10-28: 2 via TOPICAL

## 2012-10-28 MED ORDER — SODIUM CHLORIDE 0.9 % IV SOLN
INTRAVENOUS | Status: DC
  Administered 2012-10-28: 10:00:00 via INTRAVENOUS

## 2012-10-28 MED ORDER — ONDANSETRON HCL 4 MG/2ML IJ SOLN
INTRAMUSCULAR | Status: DC | PRN
  Administered 2012-10-28: 4 mg via INTRAVENOUS

## 2012-10-28 NOTE — Op Note (Signed)
[facility] [address] [place], [address]   ENDOSCOPY PROCEDURE REPORT  PATIENT: Raymond, [name]  MR#: [id number] BIRTHDATE: [birth year] , 52  yrs. old GENDER: Male ENDOSCOPIST: R.  Roetta Sessions, MD FACP FACG REFERRED BY:  Artis Delay, M.D. PROCEDURE DATE:  10/28/2012 PROCEDURE:     EGD with gastric biopsy  INDICATIONS:     Nonspecific abdominal pain; weight loss; see colonoscopy report  INFORMED CONSENT:   The risks, benefits, limitations, alternatives and imponderables have been discussed.  The potential for biopsy, esophogeal dilation, etc. have also been reviewed.  Questions have been answered.  All parties agreeable.  Please see the history and physical in the medical record for more information.  MEDICATIONS:   Versed 7 mg IV and Versed 100 mg IV. Zofran 4 mg IV. Cetacaine spray. Phenergan 12.5 mg IV.  DESCRIPTION OF PROCEDURE:   The EC-3890Li ([id number]) and EG-[id number] ([id number])  endoscope was introduced through the mouth and advanced to the second portion of the duodenum without difficulty or limitations.  The mucosal surfaces were surveyed very carefully during advancement of the scope and upon withdrawal.  Retroflexion view of the proximal stomach and esophagogastric junction was performed.      FINDINGS: Normal esophagus. Stomach empty. Diffusely abnormal gastric mucosa with mottling erosions and submucosal petechiae. Small hiatal hernia. No ulcer or infiltrating process. Patent pylorus. Normal first and second portion of the duodenum aside from a 6 mm pale yellowish submucosal nodule consistent with a lipoma (positive below sign).  THERAPEUTIC / DIAGNOSTIC MANEUVERS PERFORMED:  Biopsies of abnormal gastric mucosa taken for histologic study   COMPLICATIONS:  None  IMPRESSION:   Abnormal gastric mucosa of uncertain significance-status post biopsy. Small hiatal hernia. Duodenal lipoma.  RECOMMENDATIONS:   Followup on pathology. Further  recommendations to follow.    _______________________________ R. Roetta Sessions, MD FACP [facility] eSigned:  R. Roetta Sessions, MD FACP [facility] 10/28/2012 11:32 AM     CC:

## 2012-10-28 NOTE — H&P (View-Only) (Signed)
Primary Care Physician:  Cassell Smiles., MD Primary Gastroenterologist:  Dr. Jena Gauss   Chief Complaint  Patient presents with  . Abdominal Pain    x 2 weeks    HPI:   Mr. Dunnigan is a 52 year old male presenting at the request of Dr. Sherwood Gambler due to abdominal pain. Onset several weeks ago. Lower abdominal pain, sometimes moves, diffuse.  Almost like gas. Intermittent. Aggravated by food occasionally. Occasional nausea. No melena, hematochezia. Occasional constipation, occasional urgency and loose stool. Used to weigh 228 last year, now 197. Unintentional. Wife states "eats like a bird". GERD symptoms, Ranitidine BID. No dysphagia. No NSAIDs or aspirin powders. Baby aspirin daily.   No prior colonoscopy or upper endoscopy. Labs completed at PCP, but these are not available at the time of visit.   Past Medical History  Diagnosis Date  . GERD (gastroesophageal reflux disease)   . Borderline hypertension   . Hypercholesterolemia   . Depression with anxiety   . Bipolar disorder   . COPD (chronic obstructive pulmonary disease)   . Back pain     Past Surgical History  Procedure Laterality Date  . Cholecystectomy    . Eye surgery  Left eye    as a child  . Back surgery    . Anterior fusion cervical spine      Current Outpatient Prescriptions  Medication Sig Dispense Refill  . albuterol (PROVENTIL) (2.5 MG/3ML) 0.083% nebulizer solution       . albuterol (PROVENTIL) 2 MG tablet Take 2 mg by mouth 3 (three) times daily.      . Albuterol Sulfate (VENTOLIN HFA IN)       . allopurinol (ZYLOPRIM) 100 MG tablet       . aspirin 81 MG tablet Take 81 mg by mouth daily.      . cyclobenzaprine (FLEXERIL) 10 MG tablet       . diazepam (VALIUM) 5 MG tablet       . Diclofenac Sodium CR 100 MG 24 hr tablet       . fentaNYL (DURAGESIC - DOSED MCG/HR) 100 MCG/HR       . Gabapentin, PHN, 300 MG TABS       . oxyCODONE-acetaminophen (PERCOCET) 10-325 MG per tablet       . promethazine (PHENERGAN)  25 MG tablet Take 25 mg by mouth every 6 (six) hours as needed for nausea.      . ranitidine (ZANTAC) 150 MG tablet Take 150 mg by mouth 2 (two) times daily.      . SEROQUEL 50 MG tablet       . SPIRIVA HANDIHALER 18 MCG inhalation capsule       . sulfamethoxazole-trimethoprim (BACTRIM DS) 800-160 MG per tablet Take 1 tablet by mouth 2 (two) times daily.      . TRILEPTAL 150 MG tablet       . omeprazole (PRILOSEC) 20 MG capsule Take 1 capsule (20 mg total) by mouth daily.  30 capsule  3   No current facility-administered medications for this visit.    Allergies as of 10/15/2012 - Review Complete 07/09/2010  Allergen Reaction Noted  . Penicillins Anaphylaxis 07/09/2010  . Morphine and related  10/15/2012  . Vicodin (hydrocodone-acetaminophen)  10/15/2012    Family History  Problem Relation Age of Onset  . Colon cancer Neg Hx   . Throat cancer Father     History   Social History  . Marital Status: Married    Spouse Name: N/A  Number of Children: N/A  . Years of Education: N/A   Occupational History  . Not on file.   Social History Main Topics  . Smoking status: Current Every Day Smoker -- 3.00 packs/day for 30 years  . Smokeless tobacco: Not on file  . Alcohol Use: No     Comment: history of ETOH abuse in past, none in 4 years  . Drug Use: No  . Sexually Active: Not on file   Other Topics Concern  . Not on file   Social History Narrative  . No narrative on file    Review of Systems: Gen: Denies any fever, chills, fatigue, weight loss, lack of appetite.  CV: Denies chest pain, heart palpitations, peripheral edema, syncope.  Resp: COPD GI: see HPI GU : Denies urinary burning, urinary frequency, urinary hesitancy MS: back pain Derm: Denies rash, itching, dry skin Psych: history of depression/anxiety Heme: Denies bruising, bleeding, and enlarged lymph nodes.  Physical Exam: BP 108/68  Pulse 80  Temp(Src) 98.2 F (36.8 C) (Oral)  Ht 5\' 7"  (1.702 m)  Wt  197 lb 3.2 oz (89.449 kg)  BMI 30.88 kg/m2 General:   Alert and oriented. Pleasant and cooperative. Appears older than stated age.  Head:  Normocephalic and atraumatic. Eyes:  Without icterus, sclera clear and conjunctiva pink.  Ears:  Normal auditory acuity. Nose:  No deformity, discharge,  or lesions. Neck:  Supple, without mass or thyromegaly. Lungs:  Mild expiratory wheeze bilaterally Heart:  S1, S2 present without murmurs appreciated.  Abdomen:  +BS, soft, non-tender and non-distended. Query mild hepatomegaly. Large AP diameter.  Rectal:  Deferred  Msk:  Symmetrical without gross deformities. Normal posture. Extremities:  Without clubbing or edema. Neurologic:  Alert and  oriented x4;  grossly normal neurologically. Skin:  Intact without significant lesions or rashes. Cervical Nodes:  No significant cervical adenopathy. Psych:  Alert and cooperative. Normal mood and affect.   July 2014 [place] of abdomen Prior cholecystectomy.  Hepatic steatosis  Limited assessment of the pancreas because of obscuring bowel gas  Left kidney mid pole prominent dromedary hump.

## 2012-10-28 NOTE — Op Note (Signed)
[facility] [address] [place], [address]   COLONOSCOPY PROCEDURE REPORT  PATIENT: [name], [name]  MR#:         [id number] BIRTHDATE: 11-09-1960 , 52  yrs. old GENDER: Male ENDOSCOPIST: R.  Roetta Sessions, MD FACP FACG REFERRED BY:  Artis Delay, M.D. PROCEDURE DATE:  10/28/2012 PROCEDURE:     Ileocolonoscopy with snare polypectomy  INDICATIONS: First-ever colorectal cancer screening examination  INFORMED CONSENT:  The risks, benefits, alternatives and imponderables including but not limited to bleeding, perforation as well as the possibility of a missed lesion have been reviewed.  The potential for biopsy, lesion removal, etc. have also been discussed.  Questions have been answered.  All parties agreeable. Please see the history and physical in the medical record for more information.  MEDICATIONS: Versed 6 mg IV and Demerol 100 milligrams IV.  Zofran 4 mg IV.  DESCRIPTION OF PROCEDURE:  After a digital rectal exam was performed, the EC-3890Li ([id number])  colonoscope was advanced from the anus through the rectum and colon to the area of the cecum, ileocecal valve and appendiceal orifice.  The cecum was deeply intubated.  These structures were well-seen and photographed for the record.  From the level of the cecum and ileocecal valve, the scope was slowly and cautiously withdrawn.  The mucosal surfaces were carefully surveyed utilizing scope tip deflection to facilitate fold flattening as needed.  The scope was pulled down into the rectum where a thorough examination including retroflexion was performed.    FINDINGS:  Adequate preparation. 7 mm polyp just inside the anal verge. Anal papilla and internal hemorrhoids present. Otherwise, the remainder of the rectal mucosa appeared normal. Normal-appearing colonic mucosa. Normal appearing distal 5 cm of terminal ileal mucosa.  THERAPEUTIC / DIAGNOSTIC MANEUVERS PERFORMED:  The above-mentioned rectal polyps  hot snare removed.  COMPLICATIONS: None  CECAL WITHDRAWAL TIME:  9 minutes  IMPRESSION:  Rectal polyp-removed as described above.  RECOMMENDATIONS: Followup on pathology report. See EGD report.   _______________________________ eSigned:  R. Roetta Sessions, MD FACP [facility] 10/28/2012 11:16 AM   CC:    PATIENT NAME:  Raymond [name], Raymond [name] MR#: [id number]

## 2012-10-28 NOTE — Interval H&P Note (Signed)
History and Physical Interval Note:  10/28/2012 10:43 AM  Raymond [name]  has presented today for surgery, with the diagnosis of ABDOMINAL PAIN AND GERD  The various methods of treatment have been discussed with the patient and family. After consideration of risks, benefits and other options for treatment, the patient has consented to  Procedure(s) with comments: COLONOSCOPY (N/A) - 10:15 ESOPHAGOGASTRODUODENOSCOPY (EGD) (N/A) as a surgical intervention .  The patient's history has been reviewed, patient examined, no change in status, stable for surgery.  I have reviewed the patient's chart and labs.  Questions were answered to the patient's satisfaction.     CTA negative for cause of abdominal pain. EGD and colonoscopy today per plan.The risks, benefits, limitations, imponderables and alternatives regarding both EGD and colonoscopy have been reviewed with the patient. Questions have been answered. All parties agreeable.    Eula Listen

## 2012-10-29 ENCOUNTER — Encounter (HOSPITAL_COMMUNITY): Payer: Self-pay | Admitting: Internal Medicine

## 2012-10-30 ENCOUNTER — Encounter: Payer: Self-pay | Admitting: Internal Medicine

## 2012-11-02 NOTE — Progress Notes (Unsigned)
Letter mailed to pt. Raymond [name], please schedule follow up appt.

## 2012-11-02 NOTE — Progress Notes (Unsigned)
Letter from: Corbin Ade  Reason for Letter: Results Review Send letter to patient.  Send copy of letter with path to referring provider and PCP.   Would offer pt a f/u appt w extender in about a few weeks if not already done

## 2012-11-03 ENCOUNTER — Encounter: Payer: Self-pay | Admitting: Internal Medicine

## 2012-11-04 NOTE — Progress Notes (Signed)
Pt is aware of OV and appt card was mailed °

## 2012-11-05 DIAGNOSIS — M519 Unspecified thoracic, thoracolumbar and lumbosacral intervertebral disc disorder: Secondary | ICD-10-CM | POA: Diagnosis not present

## 2012-11-05 DIAGNOSIS — M4716 Other spondylosis with myelopathy, lumbar region: Secondary | ICD-10-CM | POA: Diagnosis not present

## 2012-11-10 ENCOUNTER — Encounter: Payer: Self-pay | Admitting: Internal Medicine

## 2012-11-11 ENCOUNTER — Encounter: Payer: Self-pay | Admitting: Gastroenterology

## 2012-11-11 ENCOUNTER — Ambulatory Visit (INDEPENDENT_AMBULATORY_CARE_PROVIDER_SITE_OTHER): Payer: Medicare Other | Admitting: Gastroenterology

## 2012-11-11 VITALS — BP 108/66 | HR 79 | Temp 98.0°F | Ht 67.0 in | Wt 198.2 lb

## 2012-11-11 DIAGNOSIS — K219 Gastro-esophageal reflux disease without esophagitis: Secondary | ICD-10-CM | POA: Diagnosis not present

## 2012-11-11 DIAGNOSIS — R109 Unspecified abdominal pain: Secondary | ICD-10-CM | POA: Diagnosis not present

## 2012-11-11 DIAGNOSIS — R634 Abnormal weight loss: Secondary | ICD-10-CM

## 2012-11-11 DIAGNOSIS — K2970 Gastritis, unspecified, without bleeding: Secondary | ICD-10-CM | POA: Diagnosis not present

## 2012-11-11 NOTE — Progress Notes (Signed)
CC'd to PCP 

## 2012-11-11 NOTE — Progress Notes (Signed)
Primary Care Physician: Cassell Smiles., MD  Primary Gastroenterologist:  Roetta Sessions, MD   Chief Complaint  Patient presents with  . Follow-up    HPI: Raymond [name] is a 52 y.o. male here for followup of abdominal pain and unintentional weight loss. Weight has been stable the past one month. Recent EGD and colonoscopy showed single hyperplastic rectal polyp, mild chronic gastritis. Prior abdominal ultrasound in July showed hepatic steatosis. CTA abdomen was unremarkable.  228 lb prior to back surgery couple of years ago. Six months ago 210 lb per wife. Eats breakfast daily. Snacks on [employer]. Mountain Dew/Tea/Cranberry Juice.   No heartburn, dysphagia. One episode of abdominal pain since on Prilosec. BM 1-2 per days. No melena. Single episode of brbpr. Vague mild lower abdominal pain. Much better. Overall feels much better.  Current Outpatient Prescriptions  Medication Sig Dispense Refill  . albuterol (PROVENTIL HFA;VENTOLIN HFA) 108 (90 BASE) MCG/ACT inhaler Inhale 2 puffs into the lungs every 6 (six) hours as needed for wheezing.      [name] albuterol (PROVENTIL) (2.5 MG/3ML) 0.083% nebulizer solution       . albuterol (PROVENTIL) 2 MG tablet Take 2 mg by mouth 3 (three) times daily.      [name] allopurinol (ZYLOPRIM) 100 MG tablet Take 100 mg by mouth daily.       [name] aspirin 81 MG tablet Take 81 mg by mouth daily.      . cyclobenzaprine (FLEXERIL) 10 MG tablet Take 10 mg by mouth 3 (three) times daily as needed.       . diazepam (VALIUM) 5 MG tablet Take 5 mg by mouth 2 (two) times daily.       . Diclofenac Sodium CR 100 MG 24 hr tablet Take 100 mg by mouth daily.       . fentaNYL (DURAGESIC - DOSED MCG/HR) 50 MCG/HR Place 1 patch onto the skin every 3 (three) days.      [name] gabapentin (NEURONTIN) 300 MG capsule Take 300 mg by mouth 3 (three) times daily.      [name] omeprazole (PRILOSEC) 20 MG capsule Take 1 capsule (20 mg total) by mouth daily.  30 capsule  3  . oxyCODONE-acetaminophen (PERCOCET)  10-325 MG per tablet Take 1-2 tablets by mouth every 6 (six) hours as needed for pain.       . promethazine (PHENERGAN) 25 MG tablet Take 25 mg by mouth every 6 (six) hours as needed for nausea.      . ranitidine (ZANTAC) 150 MG tablet Take 150 mg by mouth daily.      . SEROQUEL 50 MG tablet Take 50 mg by mouth at bedtime.       [name] SPIRIVA HANDIHALER 18 MCG inhalation capsule Place 18 mcg into inhaler and inhale daily.       . TRILEPTAL 150 MG tablet Take 150 mg by mouth 2 (two) times daily.        No current facility-administered medications for this visit.    Allergies as of 11/11/2012 - Review Complete 11/11/2012  Allergen Reaction Noted  . Penicillins Anaphylaxis 07/09/2010  . Latex  10/19/2012  . Morphine and related  10/15/2012  . Vicodin [hydrocodone-acetaminophen]  10/15/2012    ROS:  General: Negative for anorexia, weight loss, fever, chills, fatigue, weakness. See history of present illness ENT: Negative for hoarseness, difficulty swallowing , nasal congestion. CV: Negative for chest pain, angina, palpitations, dyspnea on exertion, peripheral edema.  Respiratory: Negative for dyspnea at rest, dyspnea on exertion, cough,  sputum, wheezing.  GI: See history of present illness. GU:  Negative for dysuria, hematuria, urinary incontinence, urinary frequency, nocturnal urination.  Endo: Negative for unusual weight change. See history of present illness   Physical Examination:   BP 108/66  Pulse 79  Temp(Src) 98 F (36.7 C) (Oral)  Ht 5\' 7"  (1.702 m)  Wt 198 lb 3.2 oz (89.903 kg)  BMI 31.04 kg/m2  General: Well-nourished, well-developed in no acute distress. Accompanied by wife Eyes: No icterus. Mouth: Oropharyngeal mucosa moist and pink , no lesions erythema or exudate. Lungs: Clear to auscultation bilaterally.  Heart: Regular rate and rhythm, no murmurs rubs or gallops.  Abdomen: Bowel sounds are normal, mild right upper quadrant tenderness, nondistended, no  hepatosplenomegaly or masses, no abdominal bruits or hernia , no rebound or guarding.   Extremities: No lower extremity edema. No clubbing or deformities. Neuro: Alert and oriented x 4   Skin: Warm and dry, no jaundice.   Psych: Alert and cooperative, normal mood and affect.    Imaging Studies: Ct Angio Abd/pel W/ And/or W/o  10/21/2012   *RADIOLOGY REPORT*  Clinical Data: Abdominal pain and weight loss  CT ANGIOGRAPHY ABDOMEN AND PELVIS  Technique:  Multidetector CT imaging of the abdomen and pelvis was performed using the standard protocol during bolus administration of intravenous contrast.  Multiplanar reconstructed images including MIPs were obtained and reviewed to evaluate the vascular anatomy. Delayed images were obtained as well for evaluation of the venous mesenteric anatomy.  Contrast: OMNIPAQUE IOHEXOL 300 MG/ML  SOLN  Comparison: 09/28/2012, 11/11/2009  Findings: Lung bases are well aerated without evidence of focal infiltrate.  A tiny 4 mm nodular density is noted in the lower lobe on the right best seen on image #5 of series 5.   This was present on the prior exam and given its stability over 3 years is felt to be benign in etiology.  The liver is diffusely fatty infiltrated.  The gallbladder has been surgically removed.  The spleen, adrenal glands and pancreas are normal in their CT appearance.  The kidneys are well visualized bilaterally and reveal a normal enhancement pattern.  Delayed images demonstrate a normal enhancement pattern as well.  No renal calculi are seen.  The appendix is within normal limits.  The bladder is incompletely distended.  No pelvic mass lesion or side wall abnormality is noted.  The abdominal aorta demonstrates mild calcific changes although no aneurysmal dilatation is seen.  The origins of the celiac axis, superior mesenteric artery and inferior mesenteric artery are patent.  Very mild calcific plaque is noted at the origin of the celiac axis.  Delayed  imaging demonstrates the portal vein as well as its contributing vessels to be within normal limits. Single renal arteries are identified bilaterally.  Degenerative changes of the right hip joint are seen.  Postsurgical changes in the lumbar spine are noted.  Review of the MIP images confirms these findings  IMPRESSION:  No evidence of mesenteric ischemia.  The mesenteric vessels are all widely patent.  Fatty liver.  Stable nodule, likely granuloma in the right lower lobe.   Original Report Authenticated By: Alcide Clever, M.D.

## 2012-11-11 NOTE — Assessment & Plan Note (Addendum)
Gastritis without H.pylori. Abdominal pain improved since on omeprazole for few weeks. One episode of pain since then. Appetite ok but not what it used to be. Weight stable since one month ago.   He will continue PPI for minimal of 3 months. If has HB more than 3 times per week, he will stay on PPI indefinitely. Gastritis diet provided to the patient. Recommend dietary changes as appropriate. We will have him follow up here in 2 months or he can call sooner if needed.  Retrieve copy of labs from PCP.

## 2012-11-11 NOTE — Patient Instructions (Addendum)
1. Continue omeprazole (Prilosec) 1 daily. 2. Return to the office in 2 months. Call sooner if needed.  Gastritis, Adult Gastritis is soreness and swelling (inflammation) of the lining of the stomach. Gastritis can develop as a sudden onset (acute) or long-term (chronic) condition. If gastritis is not treated, it can lead to stomach bleeding and ulcers. CAUSES  Gastritis occurs when the stomach lining is weak or damaged. Digestive juices from the stomach then inflame the weakened stomach lining. The stomach lining may be weak or damaged due to viral or bacterial infections. One common bacterial infection is the Helicobacter pylori infection. Gastritis can also result from excessive alcohol consumption, taking certain medicines, or having too much acid in the stomach.  SYMPTOMS  In some cases, there are no symptoms. When symptoms are present, they may include:  Pain or a burning sensation in the upper abdomen.  Nausea.  Vomiting.  An uncomfortable feeling of fullness after eating. DIAGNOSIS  Your caregiver may suspect you have gastritis based on your symptoms and a physical exam. To determine the cause of your gastritis, your caregiver may perform the following:  Blood or stool tests to check for the H pylori bacterium.  Gastroscopy. A thin, flexible tube (endoscope) is passed down the esophagus and into the stomach. The endoscope has a light and camera on the end. Your caregiver uses the endoscope to view the inside of the stomach.  Taking a tissue sample (biopsy) from the stomach to examine under a microscope. TREATMENT  Depending on the cause of your gastritis, medicines may be prescribed. If you have a bacterial infection, such as an H pylori infection, antibiotics may be given. If your gastritis is caused by too much acid in the stomach, H2 blockers or antacids may be given. Your caregiver may recommend that you stop taking aspirin, ibuprofen, or other nonsteroidal anti-inflammatory  drugs (NSAIDs). HOME CARE INSTRUCTIONS  Only take over-the-counter or prescription medicines as directed by your caregiver.  If you were given antibiotic medicines, take them as directed. Finish them even if you start to feel better.  Drink enough fluids to keep your urine clear or pale yellow.  Avoid foods and drinks that make your symptoms worse, such as:  Caffeine or alcoholic drinks.  Chocolate.  Peppermint or mint flavorings.  Garlic and onions.  Spicy foods.  Citrus fruits, such as oranges, lemons, or limes.  Tomato-based foods such as sauce, chili, salsa, and pizza.  Fried and fatty foods.  Eat small, frequent meals instead of large meals. SEEK IMMEDIATE MEDICAL CARE IF:   You have black or dark red stools.  You vomit blood or material that looks like coffee grounds.  You are unable to keep fluids down.  Your abdominal pain gets worse.  You have a fever.  You do not feel better after 1 week.  You have any other questions or concerns. MAKE SURE YOU:  Understand these instructions.  Will watch your condition.  Will get help right away if you are not doing well or get worse. Document Released: 02/19/2001 Document Revised: 08/27/2011 Document Reviewed: 04/10/2011 [facility] Patient Information 2014 [place], [place].

## 2012-11-18 NOTE — Progress Notes (Signed)
Labs from 09/25/2012. Sodium 141, potassium 4.1, glucose 82, BUN 12, creatinine 1.09, total bilirubin 0.3, alkaline phosphatase 88, AST 27, ALT 22, albumin 4, calcium 8.9, white blood cell 10-15,300, hemoglobin 15.6, MCV 86.1, platelets 349,000, amylase 24, lipase 15, H. pylori antibody, IgG less than 0.4, TSH 0.989 (10/2010).

## 2012-12-09 DIAGNOSIS — J069 Acute upper respiratory infection, unspecified: Secondary | ICD-10-CM | POA: Diagnosis not present

## 2012-12-09 DIAGNOSIS — Z6830 Body mass index (BMI) 30.0-30.9, adult: Secondary | ICD-10-CM | POA: Diagnosis not present

## 2012-12-09 DIAGNOSIS — G8929 Other chronic pain: Secondary | ICD-10-CM | POA: Diagnosis not present

## 2012-12-16 DIAGNOSIS — F339 Major depressive disorder, recurrent, unspecified: Secondary | ICD-10-CM | POA: Diagnosis not present

## 2013-01-11 ENCOUNTER — Encounter: Payer: Self-pay | Admitting: Internal Medicine

## 2013-01-13 DIAGNOSIS — Z23 Encounter for immunization: Secondary | ICD-10-CM | POA: Diagnosis not present

## 2013-01-13 DIAGNOSIS — M159 Polyosteoarthritis, unspecified: Secondary | ICD-10-CM | POA: Diagnosis not present

## 2013-01-13 DIAGNOSIS — Z6830 Body mass index (BMI) 30.0-30.9, adult: Secondary | ICD-10-CM | POA: Diagnosis not present

## 2013-01-13 DIAGNOSIS — F411 Generalized anxiety disorder: Secondary | ICD-10-CM | POA: Diagnosis not present

## 2013-01-29 DIAGNOSIS — M519 Unspecified thoracic, thoracolumbar and lumbosacral intervertebral disc disorder: Secondary | ICD-10-CM | POA: Diagnosis not present

## 2013-01-29 DIAGNOSIS — M4716 Other spondylosis with myelopathy, lumbar region: Secondary | ICD-10-CM | POA: Diagnosis not present

## 2013-01-29 DIAGNOSIS — M259 Joint disorder, unspecified: Secondary | ICD-10-CM | POA: Diagnosis not present

## 2013-02-09 DIAGNOSIS — M23329 Other meniscus derangements, posterior horn of medial meniscus, unspecified knee: Secondary | ICD-10-CM | POA: Diagnosis not present

## 2013-02-09 DIAGNOSIS — IMO0002 Reserved for concepts with insufficient information to code with codable children: Secondary | ICD-10-CM | POA: Diagnosis not present

## 2013-02-12 DIAGNOSIS — M259 Joint disorder, unspecified: Secondary | ICD-10-CM | POA: Diagnosis not present

## 2013-02-23 DIAGNOSIS — J209 Acute bronchitis, unspecified: Secondary | ICD-10-CM | POA: Diagnosis not present

## 2013-02-23 DIAGNOSIS — Z6829 Body mass index (BMI) 29.0-29.9, adult: Secondary | ICD-10-CM | POA: Diagnosis not present

## 2013-02-24 ENCOUNTER — Ambulatory Visit (INDEPENDENT_AMBULATORY_CARE_PROVIDER_SITE_OTHER): Payer: Medicare Other | Admitting: Orthopedic Surgery

## 2013-02-24 ENCOUNTER — Encounter: Payer: Self-pay | Admitting: Orthopedic Surgery

## 2013-02-24 VITALS — BP 118/75 | Ht 67.0 in | Wt 183.0 lb

## 2013-02-24 DIAGNOSIS — IMO0002 Reserved for concepts with insufficient information to code with codable children: Secondary | ICD-10-CM | POA: Diagnosis not present

## 2013-02-24 DIAGNOSIS — M1710 Unilateral primary osteoarthritis, unspecified knee: Secondary | ICD-10-CM

## 2013-02-24 DIAGNOSIS — M48062 Spinal stenosis, lumbar region with neurogenic claudication: Secondary | ICD-10-CM | POA: Diagnosis not present

## 2013-02-24 DIAGNOSIS — G894 Chronic pain syndrome: Secondary | ICD-10-CM | POA: Diagnosis not present

## 2013-02-24 DIAGNOSIS — M179 Osteoarthritis of knee, unspecified: Secondary | ICD-10-CM | POA: Insufficient documentation

## 2013-02-24 NOTE — Progress Notes (Signed)
Patient ID: Raymond [name], male   DOB: [birth year], 52 y.o.   MRN: [id number]  Chief Complaint  Patient presents with  . Knee Pain    Right knee pain, no injury. Referred by Dr Channing Mutters    HISTORY:  This is a 52 year old male who is status post 3 lumbar surgeries and one cervical fusion as well as a cholecystectomy has a history of COPD and comes to [place] at the request of Dr. Loraine Leriche away for evaluation of his right knee. He complains of 9/10 constant dull throbbing pain in his right knee associated with burning numbness and tingling as well as locking and catching and occasional swelling.  He has pain at night when is lying flat and has pain after sitting in the car, he will often have to get up to relieve his pain. He cannot walk to the store. He has not had any treatment. However, diclofenac, Duragesic patch 50 might milligrams Percocet 10 mg Neurontin 300 mg does not help his pain  He is allergic to Vicodin penicillin morphine and latex  His review of systems is noted for ordering of a size shortness of breath, wheezing, cough. Heartburn. Numbness. Anxiety. Easy bruising. Excessive urination and temperature intolerance. He denies weight loss chest pain frequency skin changes from  Family history arthritis cancer diabetes asthma  Social history married unemployed 2 pack per day smoker alcoholic use none  His MRI shows degenerative torn posterior horn medial meniscus intact ligaments degenerative chondrosis medial patellar facet lateral aspect medial femoral condyle synovitis patella plica small joint effusion  BP 118/75  Ht 5\' 7"  (1.702 m)  Wt 183 lb (83.008 kg)  BMI 28.66 kg/m2  He looks older than his stated age, he is well-developed and well-nourished. His grooming and hygiene are normal. He is oriented x3. Mood pleasant. Ambulation is with a cane he walks with a slumped posture and from the waist.  The upper extremities have no major malalignment issues, contractures, subluxation of the  joint, muscle atrophy or skin changes  The left knee seems to be normal with normal alignment, no contracture, no subluxation atrophy tremor or skin changes  The right knee is notable for thickened skin such as someone who kneeled a lot. Has normal motor exam in terms of muscle bulk but has weak extension and weak hip flexion. The knee is stable in all planes. He has full range of motion passively 115 of motion actively. No joint effusion. He has tenderness in the soft tissues around the knee joint and along both joint lines. McMurray sign is negative.  Distally he has no pathologic reflexes normal pulses  His lymph system is normal  Sensation is normal in his upper extremities  His balance is reasonable  His MRI shows arthritis of the knee   Assessment  Encounter Diagnoses  Name Primary?  . Spinal stenosis, lumbar region, with neurogenic claudication Yes  . Chronic pain syndrome   . OA (osteoarthritis) of knee     This patient is a patient who has chronic pain syndrome, polypharmacy, an MRI which shows degenerative arthritis and degeneration of his medial meniscus however he has symptoms that don't match his x-rays. It has been unsuccessful to operate on these patients when the symptoms don't match the x-ray findings. He does not have weightbearing pain he has symptoms of spinal stenosis and chronic pain.  I would recommend that he have a pain relief stimulator placed in his back and be treated for chronic pain  I did  inject his knee for his arthritic symptoms but do not think surgery is warranted in his case.  Knee  Injection Procedure Note  Pre-operative Diagnosis: right knee oa  Post-operative Diagnosis: same  Indications: pain  Anesthesia: ethyl chloride   Procedure Details   Verbal consent was obtained for the procedure. Time out was completed.The joint was prepped with alcohol, followed by  Ethyl chloride spray and A 20 gauge needle was inserted into the knee via  lateral approach; 4ml 1% lidocaine and 1 ml of depomedrol  was then injected into the joint . The needle was removed and the area cleansed and dressed.  Complications:  None; patient tolerated the procedure well.

## 2013-02-24 NOTE — Patient Instructions (Signed)

## 2013-03-09 DIAGNOSIS — F339 Major depressive disorder, recurrent, unspecified: Secondary | ICD-10-CM | POA: Diagnosis not present

## 2013-03-15 ENCOUNTER — Telehealth: Payer: Self-pay | Admitting: Orthopedic Surgery

## 2013-03-15 DIAGNOSIS — R55 Syncope and collapse: Secondary | ICD-10-CM | POA: Diagnosis not present

## 2013-03-15 DIAGNOSIS — R079 Chest pain, unspecified: Secondary | ICD-10-CM | POA: Diagnosis not present

## 2013-03-15 DIAGNOSIS — F411 Generalized anxiety disorder: Secondary | ICD-10-CM | POA: Diagnosis not present

## 2013-03-15 DIAGNOSIS — Z6830 Body mass index (BMI) 30.0-30.9, adult: Secondary | ICD-10-CM | POA: Diagnosis not present

## 2013-03-15 DIAGNOSIS — Z125 Encounter for screening for malignant neoplasm of prostate: Secondary | ICD-10-CM | POA: Diagnosis not present

## 2013-03-15 DIAGNOSIS — Z79899 Other long term (current) drug therapy: Secondary | ICD-10-CM | POA: Diagnosis not present

## 2013-03-15 NOTE — Telephone Encounter (Signed)
Received call from patient, states knee injection done on 02/24/13 helped for about 4 days; he is asking for another appointment, please advise.  His ph# is [phone].

## 2013-03-15 NOTE — Telephone Encounter (Signed)
Spoke with patient and advised it was too soon for another injection. Discussed Dr. Ruthe Mannan recommendations per office notes. It seems patient's symptoms are coming from his back problem. He said he was going to call DR. Roy to advise. I faxed office notes to Dr. Carloyn Manner.

## 2013-03-26 DIAGNOSIS — M259 Joint disorder, unspecified: Secondary | ICD-10-CM | POA: Diagnosis not present

## 2013-03-26 DIAGNOSIS — M4716 Other spondylosis with myelopathy, lumbar region: Secondary | ICD-10-CM | POA: Diagnosis not present

## 2013-03-26 DIAGNOSIS — M519 Unspecified thoracic, thoracolumbar and lumbosacral intervertebral disc disorder: Secondary | ICD-10-CM | POA: Diagnosis not present

## 2013-04-05 DIAGNOSIS — R0602 Shortness of breath: Secondary | ICD-10-CM | POA: Diagnosis not present

## 2013-04-05 DIAGNOSIS — R079 Chest pain, unspecified: Secondary | ICD-10-CM | POA: Insufficient documentation

## 2013-04-16 DIAGNOSIS — M4716 Other spondylosis with myelopathy, lumbar region: Secondary | ICD-10-CM | POA: Diagnosis not present

## 2013-04-19 DIAGNOSIS — F411 Generalized anxiety disorder: Secondary | ICD-10-CM | POA: Diagnosis not present

## 2013-04-19 DIAGNOSIS — N400 Enlarged prostate without lower urinary tract symptoms: Secondary | ICD-10-CM | POA: Diagnosis not present

## 2013-04-19 DIAGNOSIS — Z6830 Body mass index (BMI) 30.0-30.9, adult: Secondary | ICD-10-CM | POA: Diagnosis not present

## 2013-04-19 DIAGNOSIS — M159 Polyosteoarthritis, unspecified: Secondary | ICD-10-CM | POA: Diagnosis not present

## 2013-04-28 DIAGNOSIS — M4716 Other spondylosis with myelopathy, lumbar region: Secondary | ICD-10-CM | POA: Diagnosis not present

## 2013-04-28 DIAGNOSIS — M519 Unspecified thoracic, thoracolumbar and lumbosacral intervertebral disc disorder: Secondary | ICD-10-CM | POA: Diagnosis not present

## 2013-04-28 DIAGNOSIS — M259 Joint disorder, unspecified: Secondary | ICD-10-CM | POA: Diagnosis not present

## 2013-05-07 ENCOUNTER — Other Ambulatory Visit: Payer: Self-pay | Admitting: Gastroenterology

## 2013-06-01 DIAGNOSIS — F339 Major depressive disorder, recurrent, unspecified: Secondary | ICD-10-CM | POA: Diagnosis not present

## 2013-06-08 DIAGNOSIS — R079 Chest pain, unspecified: Secondary | ICD-10-CM | POA: Diagnosis not present

## 2013-06-09 DIAGNOSIS — M519 Unspecified thoracic, thoracolumbar and lumbosacral intervertebral disc disorder: Secondary | ICD-10-CM | POA: Diagnosis not present

## 2013-06-09 DIAGNOSIS — M259 Joint disorder, unspecified: Secondary | ICD-10-CM | POA: Diagnosis not present

## 2013-06-09 DIAGNOSIS — M4716 Other spondylosis with myelopathy, lumbar region: Secondary | ICD-10-CM | POA: Diagnosis not present

## 2013-07-30 DIAGNOSIS — M4716 Other spondylosis with myelopathy, lumbar region: Secondary | ICD-10-CM | POA: Diagnosis not present

## 2013-07-30 DIAGNOSIS — M259 Joint disorder, unspecified: Secondary | ICD-10-CM | POA: Diagnosis not present

## 2013-07-30 DIAGNOSIS — M519 Unspecified thoracic, thoracolumbar and lumbosacral intervertebral disc disorder: Secondary | ICD-10-CM | POA: Diagnosis not present

## 2013-08-05 DIAGNOSIS — G8929 Other chronic pain: Secondary | ICD-10-CM | POA: Diagnosis not present

## 2013-08-05 DIAGNOSIS — N400 Enlarged prostate without lower urinary tract symptoms: Secondary | ICD-10-CM | POA: Diagnosis not present

## 2013-08-05 DIAGNOSIS — M1990 Unspecified osteoarthritis, unspecified site: Secondary | ICD-10-CM | POA: Diagnosis not present

## 2013-08-05 DIAGNOSIS — Z6828 Body mass index (BMI) 28.0-28.9, adult: Secondary | ICD-10-CM | POA: Diagnosis not present

## 2013-08-05 DIAGNOSIS — N390 Urinary tract infection, site not specified: Secondary | ICD-10-CM | POA: Diagnosis not present

## 2013-08-23 DIAGNOSIS — IMO0002 Reserved for concepts with insufficient information to code with codable children: Secondary | ICD-10-CM | POA: Diagnosis not present

## 2013-08-23 DIAGNOSIS — M169 Osteoarthritis of hip, unspecified: Secondary | ICD-10-CM | POA: Diagnosis not present

## 2013-08-23 DIAGNOSIS — M1610 Unilateral primary osteoarthritis, unspecified hip: Secondary | ICD-10-CM | POA: Diagnosis not present

## 2013-08-23 DIAGNOSIS — M1710 Unilateral primary osteoarthritis, unspecified knee: Secondary | ICD-10-CM | POA: Diagnosis not present

## 2013-08-25 DIAGNOSIS — F339 Major depressive disorder, recurrent, unspecified: Secondary | ICD-10-CM | POA: Diagnosis not present

## 2013-08-30 ENCOUNTER — Ambulatory Visit (HOSPITAL_COMMUNITY)
Admission: RE | Admit: 2013-08-30 | Discharge: 2013-08-30 | Disposition: A | Discharge status: Home / Self Care | Payer: Medicare Other | Source: Ambulatory Visit | Attending: Internal Medicine | Admitting: Internal Medicine

## 2013-08-30 ENCOUNTER — Other Ambulatory Visit (HOSPITAL_COMMUNITY): Payer: Self-pay | Admitting: Internal Medicine

## 2013-08-30 DIAGNOSIS — R059 Cough, unspecified: Secondary | ICD-10-CM

## 2013-08-30 DIAGNOSIS — M47814 Spondylosis without myelopathy or radiculopathy, thoracic region: Secondary | ICD-10-CM | POA: Insufficient documentation

## 2013-08-30 DIAGNOSIS — R634 Abnormal weight loss: Secondary | ICD-10-CM | POA: Diagnosis not present

## 2013-08-30 DIAGNOSIS — R918 Other nonspecific abnormal finding of lung field: Secondary | ICD-10-CM | POA: Diagnosis not present

## 2013-08-30 DIAGNOSIS — M1990 Unspecified osteoarthritis, unspecified site: Secondary | ICD-10-CM | POA: Diagnosis not present

## 2013-08-30 DIAGNOSIS — K140 Glossitis: Secondary | ICD-10-CM | POA: Diagnosis not present

## 2013-08-30 DIAGNOSIS — E781 Pure hyperglyceridemia: Secondary | ICD-10-CM | POA: Diagnosis not present

## 2013-08-30 DIAGNOSIS — G8929 Other chronic pain: Secondary | ICD-10-CM | POA: Diagnosis not present

## 2013-08-30 DIAGNOSIS — R05 Cough: Secondary | ICD-10-CM | POA: Diagnosis not present

## 2013-08-30 DIAGNOSIS — Z6828 Body mass index (BMI) 28.0-28.9, adult: Secondary | ICD-10-CM | POA: Diagnosis not present

## 2013-08-30 DIAGNOSIS — R7301 Impaired fasting glucose: Secondary | ICD-10-CM | POA: Diagnosis not present

## 2013-08-30 DIAGNOSIS — R0602 Shortness of breath: Secondary | ICD-10-CM | POA: Diagnosis not present

## 2013-08-31 ENCOUNTER — Other Ambulatory Visit (HOSPITAL_COMMUNITY): Payer: Self-pay | Admitting: Internal Medicine

## 2013-08-31 DIAGNOSIS — J984 Other disorders of lung: Secondary | ICD-10-CM

## 2013-09-02 ENCOUNTER — Ambulatory Visit (HOSPITAL_COMMUNITY)
Admission: RE | Admit: 2013-09-02 | Discharge: 2013-09-02 | Disposition: A | Discharge status: Home / Self Care | Payer: Medicare Other | Source: Ambulatory Visit | Attending: Internal Medicine | Admitting: Internal Medicine

## 2013-09-02 DIAGNOSIS — D518 Other vitamin B12 deficiency anemias: Secondary | ICD-10-CM | POA: Diagnosis not present

## 2013-09-02 DIAGNOSIS — J984 Other disorders of lung: Secondary | ICD-10-CM | POA: Diagnosis not present

## 2013-09-02 DIAGNOSIS — IMO0002 Reserved for concepts with insufficient information to code with codable children: Secondary | ICD-10-CM | POA: Diagnosis not present

## 2013-09-02 DIAGNOSIS — R918 Other nonspecific abnormal finding of lung field: Secondary | ICD-10-CM | POA: Diagnosis not present

## 2013-09-06 DIAGNOSIS — L0291 Cutaneous abscess, unspecified: Secondary | ICD-10-CM | POA: Diagnosis not present

## 2013-09-06 DIAGNOSIS — L0390 Cellulitis, unspecified: Secondary | ICD-10-CM | POA: Diagnosis not present

## 2013-09-14 DIAGNOSIS — IMO0002 Reserved for concepts with insufficient information to code with codable children: Secondary | ICD-10-CM | POA: Diagnosis not present

## 2013-09-14 DIAGNOSIS — M1610 Unilateral primary osteoarthritis, unspecified hip: Secondary | ICD-10-CM | POA: Diagnosis not present

## 2013-09-14 DIAGNOSIS — L0291 Cutaneous abscess, unspecified: Secondary | ICD-10-CM | POA: Diagnosis not present

## 2013-09-14 DIAGNOSIS — M169 Osteoarthritis of hip, unspecified: Secondary | ICD-10-CM | POA: Diagnosis not present

## 2013-09-14 DIAGNOSIS — L0390 Cellulitis, unspecified: Secondary | ICD-10-CM | POA: Diagnosis not present

## 2013-09-14 DIAGNOSIS — M1710 Unilateral primary osteoarthritis, unspecified knee: Secondary | ICD-10-CM | POA: Diagnosis not present

## 2013-10-13 DIAGNOSIS — M169 Osteoarthritis of hip, unspecified: Secondary | ICD-10-CM | POA: Diagnosis not present

## 2013-10-13 DIAGNOSIS — M1610 Unilateral primary osteoarthritis, unspecified hip: Secondary | ICD-10-CM | POA: Diagnosis not present

## 2013-10-13 DIAGNOSIS — Z9889 Other specified postprocedural states: Secondary | ICD-10-CM | POA: Diagnosis not present

## 2013-10-14 DIAGNOSIS — M519 Unspecified thoracic, thoracolumbar and lumbosacral intervertebral disc disorder: Secondary | ICD-10-CM | POA: Diagnosis not present

## 2013-10-14 DIAGNOSIS — M259 Joint disorder, unspecified: Secondary | ICD-10-CM | POA: Diagnosis not present

## 2013-10-14 DIAGNOSIS — M4716 Other spondylosis with myelopathy, lumbar region: Secondary | ICD-10-CM | POA: Diagnosis not present

## 2013-10-18 DIAGNOSIS — D518 Other vitamin B12 deficiency anemias: Secondary | ICD-10-CM | POA: Diagnosis not present

## 2013-10-19 DIAGNOSIS — M1610 Unilateral primary osteoarthritis, unspecified hip: Secondary | ICD-10-CM | POA: Diagnosis not present

## 2013-10-19 DIAGNOSIS — M169 Osteoarthritis of hip, unspecified: Secondary | ICD-10-CM | POA: Diagnosis not present

## 2013-10-25 DIAGNOSIS — R059 Cough, unspecified: Secondary | ICD-10-CM | POA: Diagnosis not present

## 2013-10-25 DIAGNOSIS — Z6829 Body mass index (BMI) 29.0-29.9, adult: Secondary | ICD-10-CM | POA: Diagnosis not present

## 2013-10-25 DIAGNOSIS — R05 Cough: Secondary | ICD-10-CM | POA: Diagnosis not present

## 2013-10-25 DIAGNOSIS — J0100 Acute maxillary sinusitis, unspecified: Secondary | ICD-10-CM | POA: Diagnosis not present

## 2013-11-10 ENCOUNTER — Inpatient Hospital Stay (HOSPITAL_COMMUNITY)
Admission: AD | Admit: 2013-11-10 | Discharge: 2013-11-10 | DRG: 066 | Discharge status: Against Medical Advice | Payer: Medicare Other | Source: Other Acute Inpatient Hospital | Attending: Neurology | Admitting: Neurology

## 2013-11-10 DIAGNOSIS — I639 Cerebral infarction, unspecified: Secondary | ICD-10-CM | POA: Diagnosis present

## 2013-11-10 DIAGNOSIS — R5383 Other fatigue: Secondary | ICD-10-CM | POA: Diagnosis present

## 2013-11-10 DIAGNOSIS — I6350 Cerebral infarction due to unspecified occlusion or stenosis of unspecified cerebral artery: Principal | ICD-10-CM | POA: Diagnosis present

## 2013-11-10 DIAGNOSIS — J449 Chronic obstructive pulmonary disease, unspecified: Secondary | ICD-10-CM | POA: Diagnosis present

## 2013-11-10 DIAGNOSIS — J4489 Other specified chronic obstructive pulmonary disease: Secondary | ICD-10-CM | POA: Diagnosis present

## 2013-11-10 DIAGNOSIS — F17200 Nicotine dependence, unspecified, uncomplicated: Secondary | ICD-10-CM | POA: Diagnosis present

## 2013-11-10 DIAGNOSIS — K219 Gastro-esophageal reflux disease without esophagitis: Secondary | ICD-10-CM | POA: Diagnosis present

## 2013-11-10 DIAGNOSIS — E119 Type 2 diabetes mellitus without complications: Secondary | ICD-10-CM | POA: Diagnosis not present

## 2013-11-10 DIAGNOSIS — G8190 Hemiplegia, unspecified affecting unspecified side: Secondary | ICD-10-CM | POA: Diagnosis not present

## 2013-11-10 DIAGNOSIS — Z880 Allergy status to penicillin: Secondary | ICD-10-CM | POA: Diagnosis not present

## 2013-11-10 DIAGNOSIS — E785 Hyperlipidemia, unspecified: Secondary | ICD-10-CM | POA: Diagnosis present

## 2013-11-10 DIAGNOSIS — J9819 Other pulmonary collapse: Secondary | ICD-10-CM | POA: Diagnosis not present

## 2013-11-10 DIAGNOSIS — R5381 Other malaise: Secondary | ICD-10-CM | POA: Diagnosis not present

## 2013-11-10 DIAGNOSIS — Z79899 Other long term (current) drug therapy: Secondary | ICD-10-CM | POA: Diagnosis not present

## 2013-11-10 DIAGNOSIS — M47817 Spondylosis without myelopathy or radiculopathy, lumbosacral region: Secondary | ICD-10-CM | POA: Diagnosis not present

## 2013-11-10 DIAGNOSIS — Z823 Family history of stroke: Secondary | ICD-10-CM | POA: Diagnosis not present

## 2013-11-10 DIAGNOSIS — F319 Bipolar disorder, unspecified: Secondary | ICD-10-CM | POA: Diagnosis not present

## 2013-11-10 DIAGNOSIS — I2510 Atherosclerotic heart disease of native coronary artery without angina pectoris: Secondary | ICD-10-CM | POA: Diagnosis not present

## 2013-11-10 DIAGNOSIS — Z7982 Long term (current) use of aspirin: Secondary | ICD-10-CM | POA: Diagnosis not present

## 2013-11-10 DIAGNOSIS — Z8673 Personal history of transient ischemic attack (TIA), and cerebral infarction without residual deficits: Secondary | ICD-10-CM | POA: Diagnosis not present

## 2013-11-10 DIAGNOSIS — E7800 Pure hypercholesterolemia, unspecified: Secondary | ICD-10-CM | POA: Diagnosis not present

## 2013-11-10 DIAGNOSIS — F411 Generalized anxiety disorder: Secondary | ICD-10-CM | POA: Diagnosis not present

## 2013-11-10 DIAGNOSIS — Z862 Personal history of diseases of the blood and blood-forming organs and certain disorders involving the immune mechanism: Secondary | ICD-10-CM | POA: Diagnosis not present

## 2013-11-10 HISTORY — DX: Cerebral infarction, unspecified: I63.9

## 2013-11-10 MED ORDER — LABETALOL HCL 5 MG/ML IV SOLN: 10.0000 mg | INTRAVENOUS | Status: DC | PRN

## 2013-11-10 MED ORDER — STROKE: EARLY STAGES OF RECOVERY BOOK
Freq: Once | Status: AC
  Administered 2013-11-10: 23:00:00
  Filled 2013-11-10 (×2): qty 1

## 2013-11-10 MED ORDER — PANTOPRAZOLE SODIUM 40 MG IV SOLR: 40.0000 mg | Freq: Every day | INTRAVENOUS | Status: DC

## 2013-11-10 MED ORDER — SODIUM CHLORIDE 0.9 % IV SOLN: INTRAVENOUS | Status: DC

## 2013-11-10 NOTE — Plan of Care (Signed)
Problem: Discharge/Transitional Outcomes Goal: Family and patient agree upon discharge plan Outcome: Not Met (add Reason) Patient left AMA

## 2013-11-10 NOTE — H&P (Signed)
Admission H&P    Chief Complaint: Acute onset of weakness and numbness involving left side.  HPI: Raymond [name] is an 53 y.o. male with a history of previous strokes, hyperlipidemia, COPD and depression and anxiety who experienced acute onset of numbness and weakness involving his left side. Patient was evaluated in the emergency room at [facility], including video neurology consultation. His NIH stroke score was 5. He was treated with intravenous TPA. He experienced significant improvement in deficits. By the time he arrived here he had residual mild numbness proximally in his left lower extremity. NIH stroke score was 1.  LSN:  tPA Given: Yes mRankin:  Past Medical History  Diagnosis Date  . GERD (gastroesophageal reflux disease)   . Borderline hypertension   . Hypercholesterolemia   . Depression with anxiety   . Bipolar disorder   . COPD (chronic obstructive pulmonary disease)   . Back pain     Past Surgical History  Procedure Laterality Date  . Cholecystectomy    . Eye surgery  Left eye    as a child  . Back surgery    . Anterior fusion cervical spine    . Colonoscopy N/A 10/28/2012    QVZ:DGLOVF polyp-hyperplastic. Internal hemorrhoids. Distal 5 cm of terminal ileum appeared normal.  . Esophagogastroduodenoscopy N/A 10/28/2012    IEP:PIRJJOAC gastric mucosa with mottling and submucosal petechiae. Mild chronic gastritis but no H. pylori home path. Small hiatal hernia. Duodenal lipoma  . Lumbar spine surgery      3 x     Family History  Problem Relation Age of Onset  . Colon cancer Neg Hx   . Throat cancer Father    Social History:  reports that he has been smoking.  He does not have any smokeless tobacco history on file. He reports that he does not drink alcohol or use illicit drugs.  Allergies:  Allergies  Allergen Reactions  . Penicillins Anaphylaxis  . Latex   . Morphine And Related   . Vicodin [Hydrocodone-Acetaminophen]     Medications Prior to  Admission  Medication Sig Dispense Refill  . albuterol (PROVENTIL HFA;VENTOLIN HFA) 108 (90 BASE) MCG/ACT inhaler Inhale 2 puffs into the lungs every 6 (six) hours as needed for wheezing.      [name] albuterol (PROVENTIL) (2.5 MG/3ML) 0.083% nebulizer solution       . albuterol (PROVENTIL) 2 MG tablet Take 2 mg by mouth 3 (three) times daily.      [name] allopurinol (ZYLOPRIM) 100 MG tablet Take 100 mg by mouth daily.       [name] aspirin 81 MG tablet Take 81 mg by mouth daily.      . cyclobenzaprine (FLEXERIL) 10 MG tablet Take 10 mg by mouth 3 (three) times daily as needed.       . diazepam (VALIUM) 5 MG tablet Take 5 mg by mouth 2 (two) times daily.       . Diclofenac Sodium CR 100 MG 24 hr tablet Take 100 mg by mouth daily.       . fentaNYL (DURAGESIC - DOSED MCG/HR) 50 MCG/HR Place 1 patch onto the skin every 3 (three) days.      [name] gabapentin (NEURONTIN) 300 MG capsule Take 300 mg by mouth 3 (three) times daily.      [name] omeprazole (PRILOSEC) 20 MG capsule TAKE (1) CAPSULE BY MOUTH ONCE DAILY.  30 capsule  11  . oxyCODONE-acetaminophen (PERCOCET) 10-325 MG per tablet Take 1-2 tablets by mouth every 6 (six)  hours as needed for pain.       . promethazine (PHENERGAN) 25 MG tablet Take 25 mg by mouth every 6 (six) hours as needed for nausea.      . ranitidine (ZANTAC) 150 MG tablet Take 150 mg by mouth daily.      . SEROQUEL 50 MG tablet Take 50 mg by mouth at bedtime.       [name] SPIRIVA HANDIHALER 18 MCG inhalation capsule Place 18 mcg into inhaler and inhale daily.       . TRILEPTAL 150 MG tablet Take 150 mg by mouth 2 (two) times daily.         ROS: History obtained from the patient  General ROS: negative for - chills, fatigue, fever, night sweats, weight gain or weight loss Psychological ROS: negative for - behavioral disorder, hallucinations, memory difficulties, mood swings or suicidal ideation Ophthalmic ROS: negative for - blurry vision, double vision, eye pain or loss of vision ENT ROS: negative for  - epistaxis, nasal discharge, oral lesions, sore throat, tinnitus or vertigo Allergy and Immunology ROS: negative for - hives or itchy/watery eyes Hematological and Lymphatic ROS: negative for - bleeding problems, bruising or swollen lymph nodes Endocrine ROS: negative for - galactorrhea, hair pattern changes, polydipsia/polyuria or temperature intolerance Respiratory ROS: negative for - cough, hemoptysis, shortness of breath or wheezing Cardiovascular ROS: negative for - chest pain, dyspnea on exertion, edema or irregular heartbeat Gastrointestinal ROS: negative for - abdominal pain, diarrhea, hematemesis, nausea/vomiting or stool incontinence Genito-Urinary ROS: negative for - dysuria, hematuria, incontinence or urinary frequency/urgency Musculoskeletal ROS: Chronic low back and right hip pain Neurological ROS: as noted in HPI Dermatological ROS: negative for rash and skin lesion changes  Physical Examination: Blood pressure 117/78, pulse 78, temperature 97.9 F (36.6 C), temperature source Oral, resp. rate 18, SpO2 100.00%.  HEENT-  Normocephalic, no lesions, without obvious abnormality.  Normal external eye and conjunctiva.  Normal TM's bilaterally.  Normal auditory canals and external ears. Normal external nose, mucus membranes and septum.  Normal pharynx. Neck supple with no masses, nodes, nodules or enlargement. Cardiovascular - regular rate and rhythm, S1, S2 normal, no murmur, click, rub or gallop Lungs - chest clear, no wheezing, rales, normal symmetric air entry, Heart exam - S1, S2 normal, no murmur, no gallop, rate regular Abdomen - soft, non-tender; bowel sounds normal; no masses,  no organomegaly Extremities - no edema and no skin discoloration  Neurologic Examination: Mental Status: Alert, oriented, agitated and insisting on leaving the hospital.  Speech fluent without evidence of aphasia. Able to follow commands without difficulty. Cranial Nerves: II-Visual fields were  normal. III/IV/VI-Pupils were equal and reacted. Extraocular movements were full and conjugate.    V/VII-no facial numbness and no facial weakness. VIII-normal. X-normal speech. Motor: 5/5 bilaterally with normal tone and bulk Sensory: Normal throughout except for mild numbness involving left anterior lateral thigh. Deep Tendon Reflexes: 1+ and symmetric. Plantars: Mute bilaterally Cerebellar: Normal finger-to-nose testing.  No results found for this or any previous visit (from the past 48 hour(s)). No results found.  Assessment: 53 y.o. 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  Stroke Risk Factors - family history and hyperlipidemia  Plan: No further workup treatment is planned at this point, as patient left the hospital [place]. He and his family members were made aware that he is at high risk or serious bleeding complications from TPA which could potentially  be fatal. Patient accepted responsibility for consequences of his leaving [place].   C.R. Nicole Kindred, MD 11/10/2013, 11:10 PM

## 2013-11-10 NOTE — Progress Notes (Signed)
Patient arrived on unit with [facility] 2215. Patient refusing to stay in the bed. The risk of tpa and the importance of staying in the bed to prevent injury and bleeding were explained to patient by RN and MD. Dr. Nicole Kindred at bedside.  Security was called at 2250. Patient was educated on risks and possible complications tpa.and that he was leaving against medical advice. AMA form read aloud to patient. Three peripheral IV's removed, pressure held and gauze applied. Patient signed form at 2305. Patient was escorted out with his family and security.

## 2013-11-10 NOTE — Plan of Care (Signed)
Problem: tPA Day Progression Outcomes-Only if tPA administered Goal: Post tPA bedrest for 24 hours Outcome: Not Met (add Reason) Patient refused

## 2013-11-17 DIAGNOSIS — F339 Major depressive disorder, recurrent, unspecified: Secondary | ICD-10-CM | POA: Diagnosis not present

## 2013-12-03 DIAGNOSIS — Z23 Encounter for immunization: Secondary | ICD-10-CM | POA: Diagnosis not present

## 2013-12-08 DIAGNOSIS — Z880 Allergy status to penicillin: Secondary | ICD-10-CM | POA: Diagnosis not present

## 2013-12-08 DIAGNOSIS — E785 Hyperlipidemia, unspecified: Secondary | ICD-10-CM | POA: Diagnosis not present

## 2013-12-08 DIAGNOSIS — R1110 Vomiting, unspecified: Secondary | ICD-10-CM | POA: Diagnosis not present

## 2013-12-08 DIAGNOSIS — R509 Fever, unspecified: Secondary | ICD-10-CM | POA: Diagnosis not present

## 2013-12-08 DIAGNOSIS — I2510 Atherosclerotic heart disease of native coronary artery without angina pectoris: Secondary | ICD-10-CM | POA: Diagnosis not present

## 2013-12-08 DIAGNOSIS — F17200 Nicotine dependence, unspecified, uncomplicated: Secondary | ICD-10-CM | POA: Diagnosis not present

## 2013-12-08 DIAGNOSIS — E119 Type 2 diabetes mellitus without complications: Secondary | ICD-10-CM | POA: Diagnosis not present

## 2013-12-08 DIAGNOSIS — M47817 Spondylosis without myelopathy or radiculopathy, lumbosacral region: Secondary | ICD-10-CM | POA: Diagnosis not present

## 2013-12-08 DIAGNOSIS — M1990 Unspecified osteoarthritis, unspecified site: Secondary | ICD-10-CM | POA: Diagnosis not present

## 2013-12-08 DIAGNOSIS — G8929 Other chronic pain: Secondary | ICD-10-CM | POA: Diagnosis not present

## 2013-12-08 DIAGNOSIS — Z79899 Other long term (current) drug therapy: Secondary | ICD-10-CM | POA: Diagnosis not present

## 2013-12-08 DIAGNOSIS — J441 Chronic obstructive pulmonary disease with (acute) exacerbation: Secondary | ICD-10-CM | POA: Diagnosis not present

## 2013-12-08 DIAGNOSIS — I498 Other specified cardiac arrhythmias: Secondary | ICD-10-CM | POA: Diagnosis not present

## 2013-12-08 DIAGNOSIS — R9431 Abnormal electrocardiogram [ECG] [EKG]: Secondary | ICD-10-CM | POA: Diagnosis not present

## 2013-12-08 DIAGNOSIS — Z91040 Latex allergy status: Secondary | ICD-10-CM | POA: Diagnosis not present

## 2013-12-08 DIAGNOSIS — E7800 Pure hypercholesterolemia, unspecified: Secondary | ICD-10-CM | POA: Diagnosis not present

## 2013-12-08 DIAGNOSIS — R0602 Shortness of breath: Secondary | ICD-10-CM | POA: Diagnosis not present

## 2013-12-08 DIAGNOSIS — Z8673 Personal history of transient ischemic attack (TIA), and cerebral infarction without residual deficits: Secondary | ICD-10-CM | POA: Diagnosis not present

## 2013-12-08 DIAGNOSIS — R109 Unspecified abdominal pain: Secondary | ICD-10-CM | POA: Diagnosis not present

## 2013-12-08 DIAGNOSIS — F3189 Other bipolar disorder: Secondary | ICD-10-CM | POA: Diagnosis not present

## 2013-12-08 DIAGNOSIS — J9620 Acute and chronic respiratory failure, unspecified whether with hypoxia or hypercapnia: Secondary | ICD-10-CM | POA: Diagnosis not present

## 2013-12-08 DIAGNOSIS — R918 Other nonspecific abnormal finding of lung field: Secondary | ICD-10-CM | POA: Diagnosis not present

## 2013-12-08 DIAGNOSIS — R059 Cough, unspecified: Secondary | ICD-10-CM | POA: Diagnosis not present

## 2013-12-08 DIAGNOSIS — Z7982 Long term (current) use of aspirin: Secondary | ICD-10-CM | POA: Diagnosis not present

## 2013-12-09 DIAGNOSIS — J9621 Acute and chronic respiratory failure with hypoxia: Secondary | ICD-10-CM | POA: Diagnosis not present

## 2013-12-09 DIAGNOSIS — F319 Bipolar disorder, unspecified: Secondary | ICD-10-CM | POA: Diagnosis not present

## 2013-12-09 DIAGNOSIS — E781 Pure hyperglyceridemia: Secondary | ICD-10-CM | POA: Diagnosis not present

## 2013-12-09 DIAGNOSIS — Z91040 Latex allergy status: Secondary | ICD-10-CM | POA: Diagnosis not present

## 2013-12-09 DIAGNOSIS — Z8673 Personal history of transient ischemic attack (TIA), and cerebral infarction without residual deficits: Secondary | ICD-10-CM | POA: Diagnosis not present

## 2013-12-09 DIAGNOSIS — G8929 Other chronic pain: Secondary | ICD-10-CM | POA: Diagnosis not present

## 2013-12-09 DIAGNOSIS — Z7982 Long term (current) use of aspirin: Secondary | ICD-10-CM | POA: Diagnosis not present

## 2013-12-09 DIAGNOSIS — M1990 Unspecified osteoarthritis, unspecified site: Secondary | ICD-10-CM | POA: Diagnosis not present

## 2013-12-09 DIAGNOSIS — Z79891 Long term (current) use of opiate analgesic: Secondary | ICD-10-CM | POA: Diagnosis not present

## 2013-12-09 DIAGNOSIS — J441 Chronic obstructive pulmonary disease with (acute) exacerbation: Secondary | ICD-10-CM | POA: Diagnosis not present

## 2013-12-09 DIAGNOSIS — Z881 Allergy status to other antibiotic agents status: Secondary | ICD-10-CM | POA: Diagnosis not present

## 2013-12-09 DIAGNOSIS — F17210 Nicotine dependence, cigarettes, uncomplicated: Secondary | ICD-10-CM | POA: Diagnosis not present

## 2013-12-09 DIAGNOSIS — Z23 Encounter for immunization: Secondary | ICD-10-CM | POA: Diagnosis not present

## 2013-12-09 DIAGNOSIS — I2510 Atherosclerotic heart disease of native coronary artery without angina pectoris: Secondary | ICD-10-CM | POA: Diagnosis not present

## 2014-01-12 DIAGNOSIS — M1611 Unilateral primary osteoarthritis, right hip: Secondary | ICD-10-CM | POA: Diagnosis not present

## 2014-01-25 DIAGNOSIS — M259 Joint disorder, unspecified: Secondary | ICD-10-CM | POA: Diagnosis not present

## 2014-01-25 DIAGNOSIS — M4716 Other spondylosis with myelopathy, lumbar region: Secondary | ICD-10-CM | POA: Diagnosis not present

## 2014-01-25 DIAGNOSIS — M4646 Discitis, unspecified, lumbar region: Secondary | ICD-10-CM | POA: Diagnosis not present

## 2014-04-08 ENCOUNTER — Telehealth: Payer: Self-pay | Admitting: Internal Medicine

## 2014-04-08 NOTE — Telephone Encounter (Signed)
Pt last seen 11/2012 and it was not for constipation. This is a new problem. Pt needs ov

## 2014-04-08 NOTE — Telephone Encounter (Signed)
Pt's wife called to say that husband needed to make OV for constipation. I offered him next available on 2/15 at 2 with EG but patient said "nevermine". I told wife this was my first available with anyone and if he was that uncomfortable he should see his PCP or go to the ED. I cancelled OV.

## 2014-04-11 DIAGNOSIS — Z6828 Body mass index (BMI) 28.0-28.9, adult: Secondary | ICD-10-CM | POA: Diagnosis not present

## 2014-04-11 DIAGNOSIS — R0989 Other specified symptoms and signs involving the circulatory and respiratory systems: Secondary | ICD-10-CM | POA: Diagnosis not present

## 2014-04-11 DIAGNOSIS — E663 Overweight: Secondary | ICD-10-CM | POA: Diagnosis not present

## 2014-04-11 NOTE — Telephone Encounter (Signed)
I offered patient an OV for 2/15 and he refused.

## 2014-04-12 ENCOUNTER — Other Ambulatory Visit (HOSPITAL_COMMUNITY): Payer: Self-pay | Admitting: Family Medicine

## 2014-04-12 ENCOUNTER — Inpatient Hospital Stay (HOSPITAL_COMMUNITY): Admission: RE | Admit: 2014-04-12 | Payer: Medicare Other | Source: Ambulatory Visit

## 2014-04-12 DIAGNOSIS — R0989 Other specified symptoms and signs involving the circulatory and respiratory systems: Secondary | ICD-10-CM

## 2014-04-14 ENCOUNTER — Ambulatory Visit (HOSPITAL_COMMUNITY)
Admission: RE | Admit: 2014-04-14 | Discharge: 2014-04-14 | Disposition: A | Discharge status: Home / Self Care | Payer: Medicare Other | Source: Ambulatory Visit | Attending: Family Medicine | Admitting: Family Medicine

## 2014-04-14 DIAGNOSIS — R0989 Other specified symptoms and signs involving the circulatory and respiratory systems: Secondary | ICD-10-CM

## 2014-05-04 DIAGNOSIS — M1611 Unilateral primary osteoarthritis, right hip: Secondary | ICD-10-CM | POA: Diagnosis not present

## 2014-05-04 DIAGNOSIS — Z8673 Personal history of transient ischemic attack (TIA), and cerebral infarction without residual deficits: Secondary | ICD-10-CM | POA: Diagnosis not present

## 2014-05-10 ENCOUNTER — Other Ambulatory Visit: Payer: Self-pay | Admitting: Gastroenterology

## 2014-05-16 ENCOUNTER — Encounter: Payer: Self-pay | Admitting: Neurology

## 2014-05-16 ENCOUNTER — Ambulatory Visit (INDEPENDENT_AMBULATORY_CARE_PROVIDER_SITE_OTHER): Payer: Medicare Other | Admitting: Neurology

## 2014-05-16 VITALS — BP 122/82 | HR 68 | Ht 67.0 in | Wt 187.0 lb

## 2014-05-16 DIAGNOSIS — G458 Other transient cerebral ischemic attacks and related syndromes: Secondary | ICD-10-CM

## 2014-05-16 NOTE — Progress Notes (Signed)
PATIENT: Raymond [name] DOB: 1960/05/20  HISTORICAL  Raymond [name] is a 54 years old right-handed male, accompanied by his wife, referred by orthopedic surgeon Dr. Annie Main Case at [facility], primary care doctor Dr. Gerarda Fraction for evaluation before proceeding with right hip replacement.  He is planning on to have right hip replacement by Dr. case, is here for presurgical clearance, he is now taking aspirin 81 mg daily  He has COPD, longtime smoker, used to drink alcohol heavily, quit since 2010, also has diagnosis of bipolar disorder, anxiety, taking Valium for insomnia,  He was treated at [facility] in September second 2015, for acute onset left-sided weakness numbness, transfer from [facility], upon presentation his NIH stroke scale was 5, he was treated with IV TPA before transportation, upon arrival, his NIH stroke scale was 1, only has mild residual numbness in his left lower extremity, he left hospital [place] per record,  I have reviewed CAT scan in September second 2015, that was normal,  He was also treated at [facility] in January 23 2009, presenting with left-sided weakness, MRI of the brain showed no significant abnormality, MRA of the brain was normal.  Per patient, ultrasound of carotid artery, and echocardiogram is pending from his primary care office. Used to use alcohol, quit 2010. Ultrasound, echocardiogram is pending,  I have reviewed MRI of the brain November 2010, no significant abnormality, MRA of the brain was normal.  CT chest in June 2015:Stable appearance of the numerous tiny poorly defined pulmonary nodules as well as the 6 mm nodule in the right upper lobe anteriorly. No further followup of these abnormalities is recommended. Interval development of fairly severe degenerative disc disease at T8-9.  He now denies strokelike symptoms, gait difficulty due to right hip pain.  REVIEW OF SYSTEMS: Full 14 system review of systems performed  and notable only for wheezing, right hip pain  ALLERGIES: Allergies  Allergen Reactions  . Penicillins Anaphylaxis  . Latex   . Morphine And Related   . Vicodin [Hydrocodone-Acetaminophen]     HOME MEDICATIONS: Current Outpatient Prescriptions  Medication Sig Dispense Refill  . albuterol (PROVENTIL HFA;VENTOLIN HFA) 108 (90 BASE) MCG/ACT inhaler Inhale 2 puffs into the lungs every 6 (six) hours as needed for wheezing.    [name] albuterol (PROVENTIL) (2.5 MG/3ML) 0.083% nebulizer solution     . albuterol (PROVENTIL) 2 MG tablet Take 2 mg by mouth 3 (three) times daily.    [name] allopurinol (ZYLOPRIM) 100 MG tablet Take 100 mg by mouth daily.     [name] aspirin 81 MG tablet Take 81 mg by mouth daily.    . cyclobenzaprine (FLEXERIL) 10 MG tablet Take 10 mg by mouth 3 (three) times daily as needed.     . diazepam (VALIUM) 5 MG tablet Take 5 mg by mouth 2 (two) times daily.     . Diclofenac Sodium CR 100 MG 24 hr tablet Take 100 mg by mouth daily.     . fentaNYL (DURAGESIC - DOSED MCG/HR) 50 MCG/HR Place 1 patch onto the skin every 3 (three) days.    [name] gabapentin (NEURONTIN) 300 MG capsule Take 300 mg by mouth 3 (three) times daily.    [name] omeprazole (PRILOSEC) 20 MG capsule TAKE (1) CAPSULE BY MOUTH ONCE DAILY. 30 capsule 5  . oxyCODONE-acetaminophen (PERCOCET) 10-325 MG per tablet Take 1-2 tablets by mouth every 6 (six) hours as needed for pain.     . promethazine (PHENERGAN) 25 MG  tablet Take 25 mg by mouth every 6 (six) hours as needed for nausea.    . ranitidine (ZANTAC) 150 MG tablet Take 150 mg by mouth daily.    [name] SPIRIVA HANDIHALER 18 MCG inhalation capsule Place 18 mcg into inhaler and inhale daily.        PAST MEDICAL HISTORY: Past Medical History  Diagnosis Date  . GERD (gastroesophageal reflux disease)   . Borderline hypertension   . Hypercholesterolemia   . Depression with anxiety   . Bipolar disorder   . COPD (chronic obstructive pulmonary disease)   . Back pain   . Stroke   .  Hip pain     PAST SURGICAL HISTORY: Past Surgical History  Procedure Laterality Date  . Cholecystectomy    . Eye surgery  Left eye    as a child  . Back surgery    . Anterior fusion cervical spine    . Colonoscopy N/A 10/28/2012    FVC:BSWHQP polyp-hyperplastic. Internal hemorrhoids. Distal 5 cm of terminal ileum appeared normal.  . Esophagogastroduodenoscopy N/A 10/28/2012    RFF:[id number] gastric mucosa with mottling and submucosal petechiae. Mild chronic gastritis but no H. pylori home path. Small hiatal hernia. Duodenal lipoma  . Lumbar spine surgery      3 x     FAMILY HISTORY: Family History  Problem Relation Age of Onset  . Throat cancer Father   . Stomach cancer Mother     SOCIAL HISTORY:  History   Social History  . Marital Status: Married    Spouse Name: N/A  . Number of Children: 4  . Years of Education: 11   Occupational History  . Disabled    Social History Main Topics  . Smoking status: Current Every Day Smoker -- 2.00 packs/day for 30 years    Types: Cigarettes  . Smokeless tobacco: Not on file  . Alcohol Use: No     Comment: history of ETOH abuse in past, none in 4 years  . Drug Use: No  . Sexual Activity: Not on file   Other Topics Concern  . Not on file   Social History Narrative   Lives at home alone.   12 pack of [place] Dew per day.   Right-handed.       PHYSICAL EXAM   Filed Vitals:   05/16/14 0854  BP: 122/82  Pulse: 68  Height: 5' 7"  (1.702 m)  Weight: 187 lb (84.823 kg)    Not recorded      Body mass index is 29.28 kg/(m^2).  PHYSICAL EXAMNIATION:  Gen: NAD, conversant, well nourised, obese, well groomed                     Cardiovascular: Regular rate rhythm, no peripheral edema, warm, nontender. Eyes: Conjunctivae clear without exudates or hemorrhage Neck: Supple, no carotid bruise. Pulmonary: Clear to auscultation bilaterally   NEUROLOGICAL EXAM:  MENTAL STATUS: Speech:    Speech is normal; fluent and  spontaneous with normal comprehension.  Cognition:    The patient is oriented to person, place, and time;     recent and remote memory intact;     language fluent;     normal attention, concentration,     fund of knowledge.  CRANIAL NERVES: CN II: Visual fields are full to confrontation. Fundoscopic exam is normal with sharp discs and no vascular changes. Venous pulsations are present bilaterally. Pupils are 4 mm and briskly reactive to light. Visual acuity is 20/20 bilaterally. CN III,  IV, VI: extraocular movement are normal. No ptosis. CN V: Facial sensation is intact to pinprick in all 3 divisions bilaterally. Corneal responses are intact.  CN VII: Face is symmetric with normal eye closure and smile. CN VIII: Hearing is normal to rubbing fingers CN IX, X: Palate elevates symmetrically. Phonation is normal. CN XI: Head turning and shoulder shrug are intact CN XII: Tongue is midline with normal movements and no atrophy.  MOTOR: There is no pronator drift of out-stretched arms. Muscle bulk and tone are normal. Muscle strength is normal.   Shoulder abduction Shoulder external rotation Elbow flexion Elbow extension Wrist flexion Wrist extension Finger abduction Hip flexion Knee flexion Knee extension Ankle dorsi flexion Ankle plantar flexion  R 5 5 5 5 5 5 5 5 5 5 5 5   L 5 5 5 5 5 5 5 5 5 5 5 5     REFLEXES: Reflexes are 2+ and symmetric at the biceps, triceps, knees, and ankles. Plantar responses are flexor.  SENSORY: Light touch, pinprick, position sense, and vibration sense are intact in fingers and toes.  COORDINATION: Rapid alternating movements and fine finger movements are intact. There is no dysmetria on finger-to-nose and heel-knee-shin. There are no abnormal or extraneous movements.   GAIT/STANCE: Antalgic gait, dragging his right leg,  DIAGNOSTIC DATA (LABS, IMAGING, TESTING) - I reviewed patient records, labs, notes, testing and imaging myself where available.  Lab  Results  Component Value Date   WBC 15.3 09/25/2012   HGB 15.6 09/25/2012   HCT 44 09/25/2012   MCV 86.1 09/25/2012   PLT 283 11/11/2009      Component Value Date/Time   NA 141 09/25/2012 1554   NA 137 11/11/2009 1500   K 4.1 09/25/2012 1554   K 3.4* 11/11/2009 1500   CL 105 11/11/2009 1500   CO2 25 11/11/2009 1500   GLUCOSE 90 11/11/2009 1500   BUN 12 09/25/2012 1554   BUN 6 11/11/2009 1500   CREATININE 1.09 09/25/2012 1554   CREATININE 0.92 11/11/2009 1500   CALCIUM 8.9 09/25/2012 1554   CALCIUM 9.0 11/11/2009 1500   PROT 7.0 01/22/2009 1727   ALBUMIN 3.6 01/22/2009 1727   AST 27 09/25/2012 1554   AST 28 01/22/2009 1727   ALT 22 09/25/2012 1554   ALKPHOS 88 09/25/2012 1554   ALKPHOS 73 01/22/2009 1727   BILITOT 0.3 09/25/2012 1554   BILITOT 0.3 01/22/2009 1727   GFRNONAA >60 11/11/2009 1500   GFRAA  11/11/2009 1500    >60        The eGFR has been calculated using the MDRD equation. This calculation has not been validated in all clinical situations. eGFR's persistently <60 mL/min signify possible Chronic Kidney Disease.   Lab Results  Component Value Date   CHOL  01/23/2009    150        ATP III CLASSIFICATION:  <200     mg/dL   Desirable  200-239  mg/dL   Borderline High  >=240    mg/dL   High          HDL 20* 01/23/2009   LDLCALC  01/23/2009    UNABLE TO CALCULATE IF TRIGLYCERIDE OVER 400 mg/dL        Total Cholesterol/HDL:CHD Risk Coronary Heart Disease Risk Table                     Men   Women  1/2 Average Risk   3.4   3.3  Average Risk  5.0   4.4  2 X Average Risk   9.6   7.1  3 X Average Risk  23.4   11.0        Use the calculated Patient Ratio above and the CHD Risk Table to determine the patient's CHD Risk.        ATP III CLASSIFICATION (LDL):  <100     mg/dL   Optimal  100-129  mg/dL   Near or Above                    Optimal  130-159  mg/dL   Borderline  160-189  mg/dL   High  >190     mg/dL   Very High   TRIG 423* 01/23/2009    CHOLHDL 7.5 01/23/2009   Lab Results  Component Value Date   HGBA1C <0.40 09/25/2012   No results found for: VITAMINB12 Lab Results  Component Value Date   TSH 0.99 09/25/2012      ASSESSMENT AND PLAN  Raymond [name] is a 54 y.o. male  with multiple vascular risk factor, including previous heavy smoker, COPD, previous strokelike symptoms, having alcohol use, planning on having right hip replacement, need surgical clearance, I have reviewed previous evaluations, including most recent normal CAT scan September second 2015, that was essentially normal  1 ultrasound of carotid artery, and echocardiogram is pending from primary care. 2. If there was no significant stenosis on the ultrasound of carotid artery, no significant abnormality on echocardiogram, he is okay  from neurology standpoint to proceed with right hip replacement surgery,  3. stop aspirin 81 mg daily for one week, prior to surgery,  4 return to clinic as needed .    Marcial Pacas, M.D. Ph.D.  [facility] [address], [place], [place] [address] Ph: [phone] Fax: [phone]

## 2014-05-17 ENCOUNTER — Ambulatory Visit (HOSPITAL_COMMUNITY): Payer: Medicare Other

## 2014-05-25 ENCOUNTER — Ambulatory Visit (HOSPITAL_COMMUNITY)
Admission: RE | Admit: 2014-05-25 | Discharge: 2014-05-25 | Disposition: A | Discharge status: Home / Self Care | Payer: Medicare Other | Source: Ambulatory Visit | Attending: Family Medicine | Admitting: Family Medicine

## 2014-05-25 DIAGNOSIS — R0989 Other specified symptoms and signs involving the circulatory and respiratory systems: Secondary | ICD-10-CM | POA: Insufficient documentation

## 2014-05-25 DIAGNOSIS — I6523 Occlusion and stenosis of bilateral carotid arteries: Secondary | ICD-10-CM | POA: Diagnosis not present

## 2014-06-07 DIAGNOSIS — M1990 Unspecified osteoarthritis, unspecified site: Secondary | ICD-10-CM | POA: Diagnosis not present

## 2014-06-07 DIAGNOSIS — M4716 Other spondylosis with myelopathy, lumbar region: Secondary | ICD-10-CM | POA: Diagnosis not present

## 2014-06-07 DIAGNOSIS — M4646 Discitis, unspecified, lumbar region: Secondary | ICD-10-CM | POA: Diagnosis not present

## 2014-07-13 DIAGNOSIS — Z01818 Encounter for other preprocedural examination: Secondary | ICD-10-CM | POA: Diagnosis not present

## 2014-07-13 DIAGNOSIS — R079 Chest pain, unspecified: Secondary | ICD-10-CM | POA: Diagnosis not present

## 2014-07-13 DIAGNOSIS — R931 Abnormal findings on diagnostic imaging of heart and coronary circulation: Secondary | ICD-10-CM | POA: Diagnosis not present

## 2014-07-13 DIAGNOSIS — Z01810 Encounter for preprocedural cardiovascular examination: Secondary | ICD-10-CM | POA: Diagnosis not present

## 2014-07-14 DIAGNOSIS — M1611 Unilateral primary osteoarthritis, right hip: Secondary | ICD-10-CM | POA: Diagnosis not present

## 2014-07-19 DIAGNOSIS — G479 Sleep disorder, unspecified: Secondary | ICD-10-CM | POA: Diagnosis present

## 2014-07-19 DIAGNOSIS — Z7951 Long term (current) use of inhaled steroids: Secondary | ICD-10-CM | POA: Diagnosis not present

## 2014-07-19 DIAGNOSIS — K219 Gastro-esophageal reflux disease without esophagitis: Secondary | ICD-10-CM | POA: Diagnosis present

## 2014-07-19 DIAGNOSIS — Z880 Allergy status to penicillin: Secondary | ICD-10-CM | POA: Diagnosis not present

## 2014-07-19 DIAGNOSIS — Z79891 Long term (current) use of opiate analgesic: Secondary | ICD-10-CM | POA: Diagnosis not present

## 2014-07-19 DIAGNOSIS — Z96641 Presence of right artificial hip joint: Secondary | ICD-10-CM | POA: Diagnosis not present

## 2014-07-19 DIAGNOSIS — Z8673 Personal history of transient ischemic attack (TIA), and cerebral infarction without residual deficits: Secondary | ICD-10-CM | POA: Diagnosis not present

## 2014-07-19 DIAGNOSIS — F419 Anxiety disorder, unspecified: Secondary | ICD-10-CM | POA: Diagnosis not present

## 2014-07-19 DIAGNOSIS — G8929 Other chronic pain: Secondary | ICD-10-CM | POA: Diagnosis present

## 2014-07-19 DIAGNOSIS — Z91040 Latex allergy status: Secondary | ICD-10-CM | POA: Diagnosis not present

## 2014-07-19 DIAGNOSIS — J449 Chronic obstructive pulmonary disease, unspecified: Secondary | ICD-10-CM | POA: Diagnosis not present

## 2014-07-19 DIAGNOSIS — Z01818 Encounter for other preprocedural examination: Secondary | ICD-10-CM | POA: Diagnosis not present

## 2014-07-19 DIAGNOSIS — M1611 Unilateral primary osteoarthritis, right hip: Secondary | ICD-10-CM | POA: Diagnosis not present

## 2014-07-19 DIAGNOSIS — M87851 Other osteonecrosis, right femur: Secondary | ICD-10-CM | POA: Diagnosis present

## 2014-07-19 DIAGNOSIS — Z79899 Other long term (current) drug therapy: Secondary | ICD-10-CM | POA: Diagnosis not present

## 2014-07-19 DIAGNOSIS — H9190 Unspecified hearing loss, unspecified ear: Secondary | ICD-10-CM | POA: Diagnosis present

## 2014-07-19 DIAGNOSIS — Z825 Family history of asthma and other chronic lower respiratory diseases: Secondary | ICD-10-CM | POA: Diagnosis not present

## 2014-07-19 DIAGNOSIS — I2510 Atherosclerotic heart disease of native coronary artery without angina pectoris: Secondary | ICD-10-CM | POA: Diagnosis present

## 2014-07-19 DIAGNOSIS — Z471 Aftercare following joint replacement surgery: Secondary | ICD-10-CM | POA: Diagnosis not present

## 2014-07-19 DIAGNOSIS — M1612 Unilateral primary osteoarthritis, left hip: Secondary | ICD-10-CM | POA: Diagnosis not present

## 2014-07-19 DIAGNOSIS — F17200 Nicotine dependence, unspecified, uncomplicated: Secondary | ICD-10-CM | POA: Diagnosis present

## 2014-07-19 DIAGNOSIS — Z981 Arthrodesis status: Secondary | ICD-10-CM | POA: Diagnosis not present

## 2014-07-19 DIAGNOSIS — Z7982 Long term (current) use of aspirin: Secondary | ICD-10-CM | POA: Diagnosis not present

## 2014-07-19 DIAGNOSIS — M87852 Other osteonecrosis, left femur: Secondary | ICD-10-CM | POA: Diagnosis not present

## 2014-07-19 DIAGNOSIS — Z886 Allergy status to analgesic agent status: Secondary | ICD-10-CM | POA: Diagnosis not present

## 2014-07-19 DIAGNOSIS — M169 Osteoarthritis of hip, unspecified: Secondary | ICD-10-CM | POA: Diagnosis not present

## 2014-07-19 DIAGNOSIS — Z809 Family history of malignant neoplasm, unspecified: Secondary | ICD-10-CM | POA: Diagnosis not present

## 2014-07-21 DIAGNOSIS — R262 Difficulty in walking, not elsewhere classified: Secondary | ICD-10-CM | POA: Diagnosis not present

## 2014-07-21 DIAGNOSIS — M6281 Muscle weakness (generalized): Secondary | ICD-10-CM | POA: Diagnosis not present

## 2014-07-21 DIAGNOSIS — Z96641 Presence of right artificial hip joint: Secondary | ICD-10-CM | POA: Diagnosis not present

## 2014-07-21 DIAGNOSIS — M25551 Pain in right hip: Secondary | ICD-10-CM | POA: Diagnosis not present

## 2014-07-27 DIAGNOSIS — R262 Difficulty in walking, not elsewhere classified: Secondary | ICD-10-CM | POA: Diagnosis not present

## 2014-07-27 DIAGNOSIS — Z96641 Presence of right artificial hip joint: Secondary | ICD-10-CM | POA: Diagnosis not present

## 2014-07-27 DIAGNOSIS — M6281 Muscle weakness (generalized): Secondary | ICD-10-CM | POA: Diagnosis not present

## 2014-07-27 DIAGNOSIS — M25551 Pain in right hip: Secondary | ICD-10-CM | POA: Diagnosis not present

## 2014-07-29 DIAGNOSIS — Z96641 Presence of right artificial hip joint: Secondary | ICD-10-CM | POA: Diagnosis not present

## 2014-07-29 DIAGNOSIS — M6281 Muscle weakness (generalized): Secondary | ICD-10-CM | POA: Diagnosis not present

## 2014-07-29 DIAGNOSIS — M25551 Pain in right hip: Secondary | ICD-10-CM | POA: Diagnosis not present

## 2014-07-29 DIAGNOSIS — R262 Difficulty in walking, not elsewhere classified: Secondary | ICD-10-CM | POA: Diagnosis not present

## 2014-08-01 DIAGNOSIS — R262 Difficulty in walking, not elsewhere classified: Secondary | ICD-10-CM | POA: Diagnosis not present

## 2014-08-01 DIAGNOSIS — M25551 Pain in right hip: Secondary | ICD-10-CM | POA: Diagnosis not present

## 2014-08-01 DIAGNOSIS — Z96641 Presence of right artificial hip joint: Secondary | ICD-10-CM | POA: Diagnosis not present

## 2014-08-01 DIAGNOSIS — M6281 Muscle weakness (generalized): Secondary | ICD-10-CM | POA: Diagnosis not present

## 2014-08-02 DIAGNOSIS — Z96649 Presence of unspecified artificial hip joint: Secondary | ICD-10-CM | POA: Diagnosis not present

## 2014-08-02 DIAGNOSIS — M1611 Unilateral primary osteoarthritis, right hip: Secondary | ICD-10-CM | POA: Diagnosis not present

## 2014-08-05 DIAGNOSIS — Z96641 Presence of right artificial hip joint: Secondary | ICD-10-CM | POA: Diagnosis not present

## 2014-08-05 DIAGNOSIS — M6281 Muscle weakness (generalized): Secondary | ICD-10-CM | POA: Diagnosis not present

## 2014-08-05 DIAGNOSIS — R262 Difficulty in walking, not elsewhere classified: Secondary | ICD-10-CM | POA: Diagnosis not present

## 2014-08-05 DIAGNOSIS — M25551 Pain in right hip: Secondary | ICD-10-CM | POA: Diagnosis not present

## 2014-08-31 DIAGNOSIS — M2170 Unequal limb length (acquired), unspecified site: Secondary | ICD-10-CM | POA: Diagnosis not present

## 2014-08-31 DIAGNOSIS — Z96649 Presence of unspecified artificial hip joint: Secondary | ICD-10-CM | POA: Diagnosis not present

## 2014-08-31 DIAGNOSIS — M1611 Unilateral primary osteoarthritis, right hip: Secondary | ICD-10-CM | POA: Diagnosis not present

## 2014-09-01 DIAGNOSIS — M159 Polyosteoarthritis, unspecified: Secondary | ICD-10-CM | POA: Diagnosis not present

## 2014-09-22 DIAGNOSIS — M1990 Unspecified osteoarthritis, unspecified site: Secondary | ICD-10-CM | POA: Diagnosis not present

## 2014-09-22 DIAGNOSIS — M4646 Discitis, unspecified, lumbar region: Secondary | ICD-10-CM | POA: Diagnosis not present

## 2014-09-22 DIAGNOSIS — M4716 Other spondylosis with myelopathy, lumbar region: Secondary | ICD-10-CM | POA: Diagnosis not present

## 2014-10-03 DIAGNOSIS — K219 Gastro-esophageal reflux disease without esophagitis: Secondary | ICD-10-CM | POA: Diagnosis not present

## 2014-10-03 DIAGNOSIS — F17200 Nicotine dependence, unspecified, uncomplicated: Secondary | ICD-10-CM | POA: Diagnosis not present

## 2014-10-03 DIAGNOSIS — G4733 Obstructive sleep apnea (adult) (pediatric): Secondary | ICD-10-CM | POA: Diagnosis not present

## 2014-10-03 DIAGNOSIS — Z7982 Long term (current) use of aspirin: Secondary | ICD-10-CM | POA: Diagnosis not present

## 2014-10-03 DIAGNOSIS — R0789 Other chest pain: Secondary | ICD-10-CM | POA: Diagnosis not present

## 2014-10-03 DIAGNOSIS — R079 Chest pain, unspecified: Secondary | ICD-10-CM | POA: Diagnosis not present

## 2014-10-03 DIAGNOSIS — Z79899 Other long term (current) drug therapy: Secondary | ICD-10-CM | POA: Diagnosis not present

## 2014-10-03 DIAGNOSIS — Z8673 Personal history of transient ischemic attack (TIA), and cerebral infarction without residual deficits: Secondary | ICD-10-CM | POA: Diagnosis not present

## 2014-10-03 DIAGNOSIS — I2510 Atherosclerotic heart disease of native coronary artery without angina pectoris: Secondary | ICD-10-CM | POA: Diagnosis not present

## 2014-10-03 DIAGNOSIS — I252 Old myocardial infarction: Secondary | ICD-10-CM | POA: Diagnosis not present

## 2014-10-03 DIAGNOSIS — J441 Chronic obstructive pulmonary disease with (acute) exacerbation: Secondary | ICD-10-CM | POA: Diagnosis not present

## 2014-10-03 DIAGNOSIS — Z981 Arthrodesis status: Secondary | ICD-10-CM | POA: Diagnosis not present

## 2014-10-04 ENCOUNTER — Other Ambulatory Visit (HOSPITAL_COMMUNITY): Payer: Self-pay | Admitting: Internal Medicine

## 2014-10-04 ENCOUNTER — Other Ambulatory Visit (HOSPITAL_COMMUNITY): Payer: Medicare Other

## 2014-10-04 ENCOUNTER — Ambulatory Visit (HOSPITAL_COMMUNITY): Admission: RE | Admit: 2014-10-04 | Payer: Medicare Other | Source: Ambulatory Visit

## 2014-10-04 DIAGNOSIS — R0600 Dyspnea, unspecified: Secondary | ICD-10-CM

## 2014-10-04 DIAGNOSIS — R0781 Pleurodynia: Secondary | ICD-10-CM

## 2014-10-04 DIAGNOSIS — J449 Chronic obstructive pulmonary disease, unspecified: Secondary | ICD-10-CM | POA: Diagnosis not present

## 2014-10-04 DIAGNOSIS — R042 Hemoptysis: Secondary | ICD-10-CM

## 2014-10-04 DIAGNOSIS — G894 Chronic pain syndrome: Secondary | ICD-10-CM | POA: Diagnosis not present

## 2014-10-04 DIAGNOSIS — Z1389 Encounter for screening for other disorder: Secondary | ICD-10-CM | POA: Diagnosis not present

## 2014-10-04 DIAGNOSIS — E663 Overweight: Secondary | ICD-10-CM | POA: Diagnosis not present

## 2014-10-04 DIAGNOSIS — R079 Chest pain, unspecified: Secondary | ICD-10-CM | POA: Diagnosis not present

## 2014-10-04 DIAGNOSIS — Z6827 Body mass index (BMI) 27.0-27.9, adult: Secondary | ICD-10-CM | POA: Diagnosis not present

## 2014-12-26 DIAGNOSIS — M259 Joint disorder, unspecified: Secondary | ICD-10-CM | POA: Diagnosis not present

## 2014-12-26 DIAGNOSIS — M4646 Discitis, unspecified, lumbar region: Secondary | ICD-10-CM | POA: Diagnosis not present

## 2014-12-26 DIAGNOSIS — M1990 Unspecified osteoarthritis, unspecified site: Secondary | ICD-10-CM | POA: Diagnosis not present

## 2014-12-26 DIAGNOSIS — M4716 Other spondylosis with myelopathy, lumbar region: Secondary | ICD-10-CM | POA: Diagnosis not present

## 2015-02-21 DIAGNOSIS — Z1389 Encounter for screening for other disorder: Secondary | ICD-10-CM | POA: Diagnosis not present

## 2015-02-21 DIAGNOSIS — M1991 Primary osteoarthritis, unspecified site: Secondary | ICD-10-CM | POA: Diagnosis not present

## 2015-02-21 DIAGNOSIS — N400 Enlarged prostate without lower urinary tract symptoms: Secondary | ICD-10-CM | POA: Diagnosis not present

## 2015-02-21 DIAGNOSIS — M1A00X0 Idiopathic chronic gout, unspecified site, without tophus (tophi): Secondary | ICD-10-CM | POA: Diagnosis not present

## 2015-02-21 DIAGNOSIS — Z6830 Body mass index (BMI) 30.0-30.9, adult: Secondary | ICD-10-CM | POA: Diagnosis not present

## 2015-03-28 DIAGNOSIS — M4646 Discitis, unspecified, lumbar region: Secondary | ICD-10-CM | POA: Diagnosis not present

## 2015-03-28 DIAGNOSIS — M1990 Unspecified osteoarthritis, unspecified site: Secondary | ICD-10-CM | POA: Diagnosis not present

## 2015-03-28 DIAGNOSIS — M259 Joint disorder, unspecified: Secondary | ICD-10-CM | POA: Diagnosis not present

## 2015-03-28 DIAGNOSIS — M4716 Other spondylosis with myelopathy, lumbar region: Secondary | ICD-10-CM | POA: Diagnosis not present

## 2015-06-28 DIAGNOSIS — M4646 Discitis, unspecified, lumbar region: Secondary | ICD-10-CM | POA: Diagnosis not present

## 2015-06-28 DIAGNOSIS — M4716 Other spondylosis with myelopathy, lumbar region: Secondary | ICD-10-CM | POA: Diagnosis not present

## 2015-10-23 DIAGNOSIS — G894 Chronic pain syndrome: Secondary | ICD-10-CM | POA: Diagnosis not present

## 2015-10-23 DIAGNOSIS — F419 Anxiety disorder, unspecified: Secondary | ICD-10-CM | POA: Diagnosis not present

## 2015-10-23 DIAGNOSIS — Z6830 Body mass index (BMI) 30.0-30.9, adult: Secondary | ICD-10-CM | POA: Diagnosis not present

## 2015-10-24 DIAGNOSIS — M4716 Other spondylosis with myelopathy, lumbar region: Secondary | ICD-10-CM | POA: Diagnosis not present

## 2015-10-24 DIAGNOSIS — M4646 Discitis, unspecified, lumbar region: Secondary | ICD-10-CM | POA: Diagnosis not present

## 2016-02-12 DIAGNOSIS — J449 Chronic obstructive pulmonary disease, unspecified: Secondary | ICD-10-CM | POA: Diagnosis not present

## 2016-02-12 DIAGNOSIS — K219 Gastro-esophageal reflux disease without esophagitis: Secondary | ICD-10-CM | POA: Diagnosis not present

## 2016-02-12 DIAGNOSIS — E669 Obesity, unspecified: Secondary | ICD-10-CM | POA: Diagnosis not present

## 2016-02-12 DIAGNOSIS — Z6830 Body mass index (BMI) 30.0-30.9, adult: Secondary | ICD-10-CM | POA: Diagnosis not present

## 2016-02-12 DIAGNOSIS — Z1389 Encounter for screening for other disorder: Secondary | ICD-10-CM | POA: Diagnosis not present

## 2016-02-12 DIAGNOSIS — E6609 Other obesity due to excess calories: Secondary | ICD-10-CM | POA: Diagnosis not present

## 2016-02-12 DIAGNOSIS — M1991 Primary osteoarthritis, unspecified site: Secondary | ICD-10-CM | POA: Diagnosis not present

## 2016-02-12 DIAGNOSIS — F419 Anxiety disorder, unspecified: Secondary | ICD-10-CM | POA: Diagnosis not present

## 2016-02-12 DIAGNOSIS — G894 Chronic pain syndrome: Secondary | ICD-10-CM | POA: Diagnosis not present

## 2016-02-19 DIAGNOSIS — M4716 Other spondylosis with myelopathy, lumbar region: Secondary | ICD-10-CM | POA: Diagnosis not present

## 2016-02-19 DIAGNOSIS — M4646 Discitis, unspecified, lumbar region: Secondary | ICD-10-CM | POA: Diagnosis not present

## 2016-02-26 DIAGNOSIS — M1991 Primary osteoarthritis, unspecified site: Secondary | ICD-10-CM | POA: Diagnosis not present

## 2016-02-26 DIAGNOSIS — G894 Chronic pain syndrome: Secondary | ICD-10-CM | POA: Diagnosis not present

## 2016-02-26 DIAGNOSIS — Z6829 Body mass index (BMI) 29.0-29.9, adult: Secondary | ICD-10-CM | POA: Diagnosis not present

## 2016-02-26 DIAGNOSIS — N400 Enlarged prostate without lower urinary tract symptoms: Secondary | ICD-10-CM | POA: Diagnosis not present

## 2016-05-29 DIAGNOSIS — M4716 Other spondylosis with myelopathy, lumbar region: Secondary | ICD-10-CM | POA: Diagnosis not present

## 2016-06-13 DIAGNOSIS — I639 Cerebral infarction, unspecified: Secondary | ICD-10-CM | POA: Diagnosis not present

## 2016-06-13 DIAGNOSIS — Z6828 Body mass index (BMI) 28.0-28.9, adult: Secondary | ICD-10-CM | POA: Diagnosis not present

## 2016-06-13 DIAGNOSIS — Z719 Counseling, unspecified: Secondary | ICD-10-CM | POA: Diagnosis not present

## 2016-06-13 DIAGNOSIS — Z1389 Encounter for screening for other disorder: Secondary | ICD-10-CM | POA: Diagnosis not present

## 2016-06-13 DIAGNOSIS — E663 Overweight: Secondary | ICD-10-CM | POA: Diagnosis not present

## 2016-06-13 DIAGNOSIS — M109 Gout, unspecified: Secondary | ICD-10-CM | POA: Diagnosis not present

## 2016-06-13 DIAGNOSIS — F17290 Nicotine dependence, other tobacco product, uncomplicated: Secondary | ICD-10-CM | POA: Diagnosis not present

## 2016-06-13 DIAGNOSIS — G894 Chronic pain syndrome: Secondary | ICD-10-CM | POA: Diagnosis not present

## 2016-06-13 DIAGNOSIS — M1991 Primary osteoarthritis, unspecified site: Secondary | ICD-10-CM | POA: Diagnosis not present

## 2016-08-27 DIAGNOSIS — M4646 Discitis, unspecified, lumbar region: Secondary | ICD-10-CM | POA: Diagnosis not present

## 2016-08-27 DIAGNOSIS — M1990 Unspecified osteoarthritis, unspecified site: Secondary | ICD-10-CM | POA: Diagnosis not present

## 2016-08-27 DIAGNOSIS — M4716 Other spondylosis with myelopathy, lumbar region: Secondary | ICD-10-CM | POA: Diagnosis not present

## 2016-09-24 DIAGNOSIS — Z1389 Encounter for screening for other disorder: Secondary | ICD-10-CM | POA: Diagnosis not present

## 2016-09-24 DIAGNOSIS — E663 Overweight: Secondary | ICD-10-CM | POA: Diagnosis not present

## 2016-09-24 DIAGNOSIS — N400 Enlarged prostate without lower urinary tract symptoms: Secondary | ICD-10-CM | POA: Diagnosis not present

## 2016-09-24 DIAGNOSIS — M109 Gout, unspecified: Secondary | ICD-10-CM | POA: Diagnosis not present

## 2016-09-24 DIAGNOSIS — Z681 Body mass index (BMI) 19 or less, adult: Secondary | ICD-10-CM | POA: Diagnosis not present

## 2016-09-24 DIAGNOSIS — E6609 Other obesity due to excess calories: Secondary | ICD-10-CM | POA: Diagnosis not present

## 2016-09-24 DIAGNOSIS — M159 Polyosteoarthritis, unspecified: Secondary | ICD-10-CM | POA: Diagnosis not present

## 2016-09-24 DIAGNOSIS — Z0000 Encounter for general adult medical examination without abnormal findings: Secondary | ICD-10-CM | POA: Diagnosis not present

## 2016-09-24 DIAGNOSIS — F419 Anxiety disorder, unspecified: Secondary | ICD-10-CM | POA: Diagnosis not present

## 2016-10-03 DIAGNOSIS — R7301 Impaired fasting glucose: Secondary | ICD-10-CM | POA: Diagnosis not present

## 2016-10-03 DIAGNOSIS — Z0000 Encounter for general adult medical examination without abnormal findings: Secondary | ICD-10-CM | POA: Diagnosis not present

## 2016-10-31 ENCOUNTER — Ambulatory Visit: Payer: Medicare Other | Admitting: Nutrition

## 2016-11-29 DIAGNOSIS — M47816 Spondylosis without myelopathy or radiculopathy, lumbar region: Secondary | ICD-10-CM | POA: Diagnosis not present

## 2017-02-25 DIAGNOSIS — M4646 Discitis, unspecified, lumbar region: Secondary | ICD-10-CM | POA: Insufficient documentation

## 2017-02-25 DIAGNOSIS — M4716 Other spondylosis with myelopathy, lumbar region: Secondary | ICD-10-CM | POA: Diagnosis not present

## 2017-03-10 DIAGNOSIS — M5137 Other intervertebral disc degeneration, lumbosacral region: Secondary | ICD-10-CM | POA: Diagnosis not present

## 2017-03-10 DIAGNOSIS — M5126 Other intervertebral disc displacement, lumbar region: Secondary | ICD-10-CM | POA: Diagnosis not present

## 2017-03-10 DIAGNOSIS — M47817 Spondylosis without myelopathy or radiculopathy, lumbosacral region: Secondary | ICD-10-CM | POA: Diagnosis not present

## 2017-03-10 DIAGNOSIS — M4807 Spinal stenosis, lumbosacral region: Secondary | ICD-10-CM | POA: Diagnosis not present

## 2017-03-10 DIAGNOSIS — M4716 Other spondylosis with myelopathy, lumbar region: Secondary | ICD-10-CM | POA: Diagnosis not present

## 2017-03-19 DIAGNOSIS — M47816 Spondylosis without myelopathy or radiculopathy, lumbar region: Secondary | ICD-10-CM | POA: Diagnosis not present

## 2017-03-19 DIAGNOSIS — M4716 Other spondylosis with myelopathy, lumbar region: Secondary | ICD-10-CM | POA: Diagnosis not present

## 2017-04-04 ENCOUNTER — Other Ambulatory Visit (HOSPITAL_COMMUNITY): Payer: Self-pay | Admitting: Internal Medicine

## 2017-04-04 ENCOUNTER — Ambulatory Visit (HOSPITAL_COMMUNITY)
Admission: RE | Admit: 2017-04-04 | Discharge: 2017-04-04 | Disposition: A | Discharge status: Home / Self Care | Payer: Medicare Other | Source: Ambulatory Visit | Attending: Internal Medicine | Admitting: Internal Medicine

## 2017-04-04 DIAGNOSIS — Z1389 Encounter for screening for other disorder: Secondary | ICD-10-CM | POA: Diagnosis not present

## 2017-04-04 DIAGNOSIS — G894 Chronic pain syndrome: Secondary | ICD-10-CM | POA: Diagnosis not present

## 2017-04-04 DIAGNOSIS — Z6828 Body mass index (BMI) 28.0-28.9, adult: Secondary | ICD-10-CM | POA: Diagnosis not present

## 2017-04-04 DIAGNOSIS — F419 Anxiety disorder, unspecified: Secondary | ICD-10-CM | POA: Diagnosis not present

## 2017-04-04 DIAGNOSIS — J449 Chronic obstructive pulmonary disease, unspecified: Secondary | ICD-10-CM | POA: Insufficient documentation

## 2017-04-04 DIAGNOSIS — R042 Hemoptysis: Secondary | ICD-10-CM

## 2017-04-04 DIAGNOSIS — I639 Cerebral infarction, unspecified: Secondary | ICD-10-CM | POA: Diagnosis not present

## 2017-05-08 DIAGNOSIS — J111 Influenza due to unidentified influenza virus with other respiratory manifestations: Secondary | ICD-10-CM | POA: Diagnosis not present

## 2017-05-08 DIAGNOSIS — I10 Essential (primary) hypertension: Secondary | ICD-10-CM | POA: Diagnosis not present

## 2017-05-08 DIAGNOSIS — G8929 Other chronic pain: Secondary | ICD-10-CM | POA: Diagnosis not present

## 2017-05-08 DIAGNOSIS — J9620 Acute and chronic respiratory failure, unspecified whether with hypoxia or hypercapnia: Secondary | ICD-10-CM | POA: Diagnosis not present

## 2017-05-08 DIAGNOSIS — E119 Type 2 diabetes mellitus without complications: Secondary | ICD-10-CM | POA: Diagnosis not present

## 2017-05-08 DIAGNOSIS — K760 Fatty (change of) liver, not elsewhere classified: Secondary | ICD-10-CM | POA: Diagnosis not present

## 2017-05-08 DIAGNOSIS — J9602 Acute respiratory failure with hypercapnia: Secondary | ICD-10-CM | POA: Diagnosis not present

## 2017-05-08 DIAGNOSIS — J9601 Acute respiratory failure with hypoxia: Secondary | ICD-10-CM | POA: Diagnosis not present

## 2017-05-08 DIAGNOSIS — R0602 Shortness of breath: Secondary | ICD-10-CM | POA: Diagnosis not present

## 2017-05-08 DIAGNOSIS — J101 Influenza due to other identified influenza virus with other respiratory manifestations: Secondary | ICD-10-CM | POA: Diagnosis not present

## 2017-05-08 DIAGNOSIS — J441 Chronic obstructive pulmonary disease with (acute) exacerbation: Secondary | ICD-10-CM | POA: Diagnosis not present

## 2017-05-08 DIAGNOSIS — Z720 Tobacco use: Secondary | ICD-10-CM | POA: Diagnosis not present

## 2017-05-08 DIAGNOSIS — R945 Abnormal results of liver function studies: Secondary | ICD-10-CM | POA: Diagnosis not present

## 2017-05-09 DIAGNOSIS — J9601 Acute respiratory failure with hypoxia: Secondary | ICD-10-CM | POA: Diagnosis not present

## 2017-05-09 DIAGNOSIS — Z825 Family history of asthma and other chronic lower respiratory diseases: Secondary | ICD-10-CM | POA: Diagnosis not present

## 2017-05-09 DIAGNOSIS — R0602 Shortness of breath: Secondary | ICD-10-CM | POA: Diagnosis not present

## 2017-05-09 DIAGNOSIS — Z955 Presence of coronary angioplasty implant and graft: Secondary | ICD-10-CM | POA: Diagnosis not present

## 2017-05-09 DIAGNOSIS — F419 Anxiety disorder, unspecified: Secondary | ICD-10-CM | POA: Diagnosis not present

## 2017-05-09 DIAGNOSIS — Z885 Allergy status to narcotic agent status: Secondary | ICD-10-CM | POA: Diagnosis not present

## 2017-05-09 DIAGNOSIS — Z720 Tobacco use: Secondary | ICD-10-CM | POA: Diagnosis not present

## 2017-05-09 DIAGNOSIS — Z809 Family history of malignant neoplasm, unspecified: Secondary | ICD-10-CM | POA: Diagnosis not present

## 2017-05-09 DIAGNOSIS — F17200 Nicotine dependence, unspecified, uncomplicated: Secondary | ICD-10-CM | POA: Diagnosis not present

## 2017-05-09 DIAGNOSIS — R0682 Tachypnea, not elsewhere classified: Secondary | ICD-10-CM | POA: Diagnosis not present

## 2017-05-09 DIAGNOSIS — Z8673 Personal history of transient ischemic attack (TIA), and cerebral infarction without residual deficits: Secondary | ICD-10-CM | POA: Diagnosis not present

## 2017-05-09 DIAGNOSIS — F17210 Nicotine dependence, cigarettes, uncomplicated: Secondary | ICD-10-CM | POA: Diagnosis not present

## 2017-05-09 DIAGNOSIS — I259 Chronic ischemic heart disease, unspecified: Secondary | ICD-10-CM | POA: Diagnosis not present

## 2017-05-09 DIAGNOSIS — Z880 Allergy status to penicillin: Secondary | ICD-10-CM | POA: Diagnosis not present

## 2017-05-09 DIAGNOSIS — M109 Gout, unspecified: Secondary | ICD-10-CM | POA: Diagnosis present

## 2017-05-09 DIAGNOSIS — J9602 Acute respiratory failure with hypercapnia: Secondary | ICD-10-CM | POA: Diagnosis not present

## 2017-05-09 DIAGNOSIS — E119 Type 2 diabetes mellitus without complications: Secondary | ICD-10-CM | POA: Diagnosis not present

## 2017-05-09 DIAGNOSIS — E876 Hypokalemia: Secondary | ICD-10-CM | POA: Diagnosis not present

## 2017-05-09 DIAGNOSIS — G8929 Other chronic pain: Secondary | ICD-10-CM | POA: Diagnosis present

## 2017-05-09 DIAGNOSIS — J9621 Acute and chronic respiratory failure with hypoxia: Secondary | ICD-10-CM | POA: Diagnosis not present

## 2017-05-09 DIAGNOSIS — N400 Enlarged prostate without lower urinary tract symptoms: Secondary | ICD-10-CM | POA: Diagnosis present

## 2017-05-09 DIAGNOSIS — K760 Fatty (change of) liver, not elsewhere classified: Secondary | ICD-10-CM | POA: Diagnosis not present

## 2017-05-09 DIAGNOSIS — J101 Influenza due to other identified influenza virus with other respiratory manifestations: Secondary | ICD-10-CM | POA: Diagnosis not present

## 2017-05-09 DIAGNOSIS — E1165 Type 2 diabetes mellitus with hyperglycemia: Secondary | ICD-10-CM | POA: Diagnosis not present

## 2017-05-09 DIAGNOSIS — J441 Chronic obstructive pulmonary disease with (acute) exacerbation: Secondary | ICD-10-CM | POA: Diagnosis not present

## 2017-05-09 DIAGNOSIS — Z23 Encounter for immunization: Secondary | ICD-10-CM | POA: Diagnosis not present

## 2017-05-09 DIAGNOSIS — I209 Angina pectoris, unspecified: Secondary | ICD-10-CM | POA: Diagnosis not present

## 2017-05-13 DIAGNOSIS — Z6828 Body mass index (BMI) 28.0-28.9, adult: Secondary | ICD-10-CM | POA: Diagnosis not present

## 2017-05-13 DIAGNOSIS — J441 Chronic obstructive pulmonary disease with (acute) exacerbation: Secondary | ICD-10-CM | POA: Diagnosis not present

## 2017-05-13 DIAGNOSIS — J9600 Acute respiratory failure, unspecified whether with hypoxia or hypercapnia: Secondary | ICD-10-CM | POA: Diagnosis not present

## 2017-05-13 DIAGNOSIS — I110 Hypertensive heart disease with heart failure: Secondary | ICD-10-CM | POA: Diagnosis not present

## 2017-05-13 DIAGNOSIS — K219 Gastro-esophageal reflux disease without esophagitis: Secondary | ICD-10-CM | POA: Diagnosis not present

## 2017-06-17 DIAGNOSIS — M4716 Other spondylosis with myelopathy, lumbar region: Secondary | ICD-10-CM | POA: Diagnosis not present

## 2017-06-17 DIAGNOSIS — M4646 Discitis, unspecified, lumbar region: Secondary | ICD-10-CM | POA: Diagnosis not present

## 2017-06-21 DIAGNOSIS — R0902 Hypoxemia: Secondary | ICD-10-CM | POA: Diagnosis not present

## 2017-06-21 DIAGNOSIS — I2510 Atherosclerotic heart disease of native coronary artery without angina pectoris: Secondary | ICD-10-CM | POA: Diagnosis present

## 2017-06-21 DIAGNOSIS — F17210 Nicotine dependence, cigarettes, uncomplicated: Secondary | ICD-10-CM | POA: Diagnosis not present

## 2017-06-21 DIAGNOSIS — R509 Fever, unspecified: Secondary | ICD-10-CM | POA: Diagnosis not present

## 2017-06-21 DIAGNOSIS — M549 Dorsalgia, unspecified: Secondary | ICD-10-CM | POA: Diagnosis present

## 2017-06-21 DIAGNOSIS — E1165 Type 2 diabetes mellitus with hyperglycemia: Secondary | ICD-10-CM | POA: Diagnosis present

## 2017-06-21 DIAGNOSIS — Z809 Family history of malignant neoplasm, unspecified: Secondary | ICD-10-CM | POA: Diagnosis not present

## 2017-06-21 DIAGNOSIS — Z7982 Long term (current) use of aspirin: Secondary | ICD-10-CM | POA: Diagnosis not present

## 2017-06-21 DIAGNOSIS — G4733 Obstructive sleep apnea (adult) (pediatric): Secondary | ICD-10-CM | POA: Diagnosis present

## 2017-06-21 DIAGNOSIS — R000 Tachycardia, unspecified: Secondary | ICD-10-CM | POA: Diagnosis not present

## 2017-06-21 DIAGNOSIS — G8929 Other chronic pain: Secondary | ICD-10-CM | POA: Diagnosis present

## 2017-06-21 DIAGNOSIS — Z880 Allergy status to penicillin: Secondary | ICD-10-CM | POA: Diagnosis not present

## 2017-06-21 DIAGNOSIS — R0602 Shortness of breath: Secondary | ICD-10-CM | POA: Diagnosis not present

## 2017-06-21 DIAGNOSIS — J441 Chronic obstructive pulmonary disease with (acute) exacerbation: Secondary | ICD-10-CM | POA: Diagnosis not present

## 2017-06-21 DIAGNOSIS — Z885 Allergy status to narcotic agent status: Secondary | ICD-10-CM | POA: Diagnosis not present

## 2017-06-21 DIAGNOSIS — Z825 Family history of asthma and other chronic lower respiratory diseases: Secondary | ICD-10-CM | POA: Diagnosis not present

## 2017-06-21 DIAGNOSIS — R0682 Tachypnea, not elsewhere classified: Secondary | ICD-10-CM | POA: Diagnosis not present

## 2017-06-21 DIAGNOSIS — J9690 Respiratory failure, unspecified, unspecified whether with hypoxia or hypercapnia: Secondary | ICD-10-CM | POA: Diagnosis not present

## 2017-06-21 DIAGNOSIS — M1611 Unilateral primary osteoarthritis, right hip: Secondary | ICD-10-CM | POA: Diagnosis present

## 2017-06-21 DIAGNOSIS — Z79899 Other long term (current) drug therapy: Secondary | ICD-10-CM | POA: Diagnosis not present

## 2017-06-21 DIAGNOSIS — J9601 Acute respiratory failure with hypoxia: Secondary | ICD-10-CM | POA: Diagnosis not present

## 2017-06-21 DIAGNOSIS — K219 Gastro-esophageal reflux disease without esophagitis: Secondary | ICD-10-CM | POA: Diagnosis present

## 2017-06-21 DIAGNOSIS — Z8673 Personal history of transient ischemic attack (TIA), and cerebral infarction without residual deficits: Secondary | ICD-10-CM | POA: Diagnosis not present

## 2017-06-21 DIAGNOSIS — F17200 Nicotine dependence, unspecified, uncomplicated: Secondary | ICD-10-CM | POA: Diagnosis not present

## 2017-06-21 DIAGNOSIS — Z791 Long term (current) use of non-steroidal anti-inflammatories (NSAID): Secondary | ICD-10-CM | POA: Diagnosis not present

## 2017-06-22 ENCOUNTER — Encounter (HOSPITAL_COMMUNITY): Payer: Self-pay | Admitting: Emergency Medicine

## 2017-06-22 ENCOUNTER — Inpatient Hospital Stay (HOSPITAL_COMMUNITY)
Admission: EM | Admit: 2017-06-22 | Discharge: 2017-06-24 | DRG: 190 | Disposition: A | Discharge status: Home / Self Care | Payer: Medicare Other | Attending: Internal Medicine | Admitting: Internal Medicine

## 2017-06-22 ENCOUNTER — Other Ambulatory Visit: Payer: Self-pay

## 2017-06-22 ENCOUNTER — Emergency Department (HOSPITAL_COMMUNITY): Payer: Medicare Other

## 2017-06-22 DIAGNOSIS — Z880 Allergy status to penicillin: Secondary | ICD-10-CM

## 2017-06-22 DIAGNOSIS — E7800 Pure hypercholesterolemia, unspecified: Secondary | ICD-10-CM | POA: Diagnosis present

## 2017-06-22 DIAGNOSIS — Z91040 Latex allergy status: Secondary | ICD-10-CM

## 2017-06-22 DIAGNOSIS — Z7982 Long term (current) use of aspirin: Secondary | ICD-10-CM | POA: Diagnosis not present

## 2017-06-22 DIAGNOSIS — Z885 Allergy status to narcotic agent status: Secondary | ICD-10-CM | POA: Diagnosis not present

## 2017-06-22 DIAGNOSIS — E871 Hypo-osmolality and hyponatremia: Secondary | ICD-10-CM | POA: Diagnosis present

## 2017-06-22 DIAGNOSIS — F17210 Nicotine dependence, cigarettes, uncomplicated: Secondary | ICD-10-CM | POA: Diagnosis present

## 2017-06-22 DIAGNOSIS — G8929 Other chronic pain: Secondary | ICD-10-CM | POA: Diagnosis present

## 2017-06-22 DIAGNOSIS — R0902 Hypoxemia: Secondary | ICD-10-CM | POA: Diagnosis not present

## 2017-06-22 DIAGNOSIS — R0602 Shortness of breath: Secondary | ICD-10-CM | POA: Diagnosis not present

## 2017-06-22 DIAGNOSIS — E1165 Type 2 diabetes mellitus with hyperglycemia: Secondary | ICD-10-CM | POA: Diagnosis present

## 2017-06-22 DIAGNOSIS — R739 Hyperglycemia, unspecified: Secondary | ICD-10-CM | POA: Diagnosis not present

## 2017-06-22 DIAGNOSIS — E875 Hyperkalemia: Secondary | ICD-10-CM | POA: Diagnosis present

## 2017-06-22 DIAGNOSIS — K219 Gastro-esophageal reflux disease without esophagitis: Secondary | ICD-10-CM | POA: Diagnosis present

## 2017-06-22 DIAGNOSIS — F319 Bipolar disorder, unspecified: Secondary | ICD-10-CM | POA: Diagnosis present

## 2017-06-22 DIAGNOSIS — T380X5A Adverse effect of glucocorticoids and synthetic analogues, initial encounter: Secondary | ICD-10-CM | POA: Diagnosis present

## 2017-06-22 DIAGNOSIS — R000 Tachycardia, unspecified: Secondary | ICD-10-CM | POA: Diagnosis present

## 2017-06-22 DIAGNOSIS — Z981 Arthrodesis status: Secondary | ICD-10-CM

## 2017-06-22 DIAGNOSIS — G629 Polyneuropathy, unspecified: Secondary | ICD-10-CM | POA: Diagnosis present

## 2017-06-22 DIAGNOSIS — D72829 Elevated white blood cell count, unspecified: Secondary | ICD-10-CM | POA: Diagnosis present

## 2017-06-22 DIAGNOSIS — F17209 Nicotine dependence, unspecified, with unspecified nicotine-induced disorders: Secondary | ICD-10-CM

## 2017-06-22 DIAGNOSIS — Z8673 Personal history of transient ischemic attack (TIA), and cerebral infarction without residual deficits: Secondary | ICD-10-CM

## 2017-06-22 DIAGNOSIS — Z79899 Other long term (current) drug therapy: Secondary | ICD-10-CM

## 2017-06-22 DIAGNOSIS — E119 Type 2 diabetes mellitus without complications: Secondary | ICD-10-CM

## 2017-06-22 DIAGNOSIS — J441 Chronic obstructive pulmonary disease with (acute) exacerbation: Secondary | ICD-10-CM | POA: Diagnosis not present

## 2017-06-22 DIAGNOSIS — Z7984 Long term (current) use of oral hypoglycemic drugs: Secondary | ICD-10-CM | POA: Diagnosis not present

## 2017-06-22 DIAGNOSIS — J449 Chronic obstructive pulmonary disease, unspecified: Secondary | ICD-10-CM | POA: Diagnosis present

## 2017-06-22 DIAGNOSIS — I10 Essential (primary) hypertension: Secondary | ICD-10-CM | POA: Diagnosis present

## 2017-06-22 DIAGNOSIS — J9601 Acute respiratory failure with hypoxia: Secondary | ICD-10-CM | POA: Diagnosis present

## 2017-06-22 DIAGNOSIS — M545 Low back pain: Secondary | ICD-10-CM | POA: Diagnosis present

## 2017-06-22 DIAGNOSIS — F17200 Nicotine dependence, unspecified, uncomplicated: Secondary | ICD-10-CM | POA: Diagnosis not present

## 2017-06-22 DIAGNOSIS — F419 Anxiety disorder, unspecified: Secondary | ICD-10-CM

## 2017-06-22 HISTORY — DX: Anxiety disorder, unspecified: F41.9

## 2017-06-22 HISTORY — DX: Headache, unspecified: R51.9

## 2017-06-22 HISTORY — DX: Headache: R51

## 2017-06-22 HISTORY — DX: Tobacco use: Z72.0

## 2017-06-22 HISTORY — DX: Other chronic pain: G89.29

## 2017-06-22 LAB — BASIC METABOLIC PANEL
Anion gap: 14 (ref 5–15)
BUN: 10 mg/dL (ref 6–20)
CO2: 20 mmol/L — ABNORMAL LOW (ref 22–32)
Calcium: 8.6 mg/dL — ABNORMAL LOW (ref 8.9–10.3)
Chloride: 96 mmol/L — ABNORMAL LOW (ref 101–111)
Creatinine, Ser: 1.1 mg/dL (ref 0.61–1.24)
GFR calc Af Amer: >60 mL/min (ref >60)
GFR calc non Af Amer: >60 mL/min (ref >60)
Glucose, Bld: 502 mg/dL (ref 65–99)
Potassium: 4.6 mmol/L (ref 3.5–5.1)
Sodium: 130 mmol/L — ABNORMAL LOW (ref 135–145)

## 2017-06-22 LAB — CBC WITH DIFFERENTIAL/PLATELET
Basophils Absolute: 0 10*3/uL (ref 0.0–0.1)
Basophils Relative: 0 %
Eosinophils Absolute: 0 10*3/uL (ref 0.0–0.7)
Eosinophils Relative: 0 %
HCT: 44.5 % (ref 39.0–52.0)
Hemoglobin: 14.7 g/dL (ref 13.0–17.0)
Lymphocytes Relative: 12 %
Lymphs Abs: 1.3 10*3/uL (ref 0.7–4.0)
MCH: 29.7 pg (ref 26.0–34.0)
MCHC: 33 g/dL (ref 30.0–36.0)
MCV: 89.9 fL (ref 78.0–100.0)
Monocytes Absolute: 0.5 10*3/uL (ref 0.1–1.0)
Monocytes Relative: 5 %
Neutro Abs: 9.1 10*3/uL — ABNORMAL HIGH (ref 1.7–7.7)
Neutrophils Relative %: 83 %
Platelets: 292 10*3/uL (ref 150–400)
RBC: 4.95 MIL/uL (ref 4.22–5.81)
RDW: 14.2 % (ref 11.5–15.5)
WBC: 10.9 10*3/uL — ABNORMAL HIGH (ref 4.0–10.5)

## 2017-06-22 LAB — TROPONIN I: Troponin I: <0.03 ng/mL (ref <0.03)

## 2017-06-22 LAB — CBG MONITORING, ED
Glucose-Capillary: 533 mg/dL (ref 65–99)
Glucose-Capillary: 511 mg/dL (ref 65–99)
Glucose-Capillary: 319 mg/dL — ABNORMAL HIGH (ref 65–99)
Glucose-Capillary: 343 mg/dL — ABNORMAL HIGH (ref 65–99)

## 2017-06-22 LAB — BRAIN NATRIURETIC PEPTIDE: B Natriuretic Peptide: 126 pg/mL — ABNORMAL HIGH (ref 0.0–100.0)

## 2017-06-22 LAB — GLUCOSE, CAPILLARY: Glucose-Capillary: 256 mg/dL — ABNORMAL HIGH (ref 65–99)

## 2017-06-22 MED ORDER — ACETAMINOPHEN 650 MG RE SUPP: 650.0000 mg | Freq: Four times a day (QID) | RECTAL | Status: DC | PRN

## 2017-06-22 MED ORDER — BUDESONIDE 0.25 MG/2ML IN SUSP
0.2500 mg | Freq: Two times a day (BID) | RESPIRATORY_TRACT | Status: DC
  Administered 2017-06-22 – 2017-06-24 (×4): 0.25 mg via RESPIRATORY_TRACT
  Filled 2017-06-22 (×7): qty 2

## 2017-06-22 MED ORDER — CYCLOBENZAPRINE HCL 10 MG PO TABS: 10.0000 mg | ORAL_TABLET | Freq: Three times a day (TID) | ORAL | Status: DC | PRN

## 2017-06-22 MED ORDER — FOLIC ACID 1 MG PO TABS
1.0000 mg | ORAL_TABLET | Freq: Every day | ORAL | Status: DC
  Administered 2017-06-23 – 2017-06-24 (×2): 1 mg via ORAL
  Filled 2017-06-22 (×2): qty 1

## 2017-06-22 MED ORDER — INSULIN ASPART 100 UNIT/ML ~~LOC~~ SOLN
0.0000 [IU] | Freq: Every day | SUBCUTANEOUS | Status: DC
  Administered 2017-06-22: 3 [IU] via SUBCUTANEOUS
  Filled 2017-06-22: qty 1

## 2017-06-22 MED ORDER — FENTANYL 50 MCG/HR TD PT72: 50.0000 ug | MEDICATED_PATCH | TRANSDERMAL | Status: DC

## 2017-06-22 MED ORDER — INSULIN REGULAR BOLUS VIA INFUSION
0.0000 [IU] | Freq: Three times a day (TID) | INTRAVENOUS | Status: DC
  Filled 2017-06-22: qty 10

## 2017-06-22 MED ORDER — DEXTROSE-NACL 5-0.45 % IV SOLN: INTRAVENOUS | Status: DC

## 2017-06-22 MED ORDER — INSULIN ASPART 100 UNIT/ML ~~LOC~~ SOLN
0.0000 [IU] | Freq: Three times a day (TID) | SUBCUTANEOUS | Status: DC
  Administered 2017-06-22: 15 [IU] via SUBCUTANEOUS
  Administered 2017-06-23: 3 [IU] via SUBCUTANEOUS
  Administered 2017-06-23: 7 [IU] via SUBCUTANEOUS
  Administered 2017-06-23: 11 [IU] via SUBCUTANEOUS
  Administered 2017-06-24: 15 [IU] via SUBCUTANEOUS
  Filled 2017-06-22 (×2): qty 1

## 2017-06-22 MED ORDER — OXYCODONE-ACETAMINOPHEN 10-325 MG PO TABS
1.0000 | ORAL_TABLET | Freq: Four times a day (QID) | ORAL | Status: DC | PRN
Start: 2017-06-22 — End: 2017-06-22

## 2017-06-22 MED ORDER — NICOTINE 14 MG/24HR TD PT24
14.0000 mg | MEDICATED_PATCH | Freq: Every day | TRANSDERMAL | Status: DC
  Administered 2017-06-22 – 2017-06-24 (×3): 14 mg via TRANSDERMAL
  Filled 2017-06-22 (×3): qty 1

## 2017-06-22 MED ORDER — SODIUM CHLORIDE 0.9 % IV SOLN
INTRAVENOUS | Status: DC
  Filled 2017-06-22: qty 1

## 2017-06-22 MED ORDER — SODIUM CHLORIDE 0.9 % IV SOLN: INTRAVENOUS | Status: DC

## 2017-06-22 MED ORDER — TAMSULOSIN HCL 0.4 MG PO CAPS
0.4000 mg | ORAL_CAPSULE | Freq: Every day | ORAL | Status: DC
  Administered 2017-06-23: 0.4 mg via ORAL
  Filled 2017-06-22: qty 1

## 2017-06-22 MED ORDER — INSULIN DETEMIR 100 UNIT/ML ~~LOC~~ SOLN
10.0000 [IU] | Freq: Every day | SUBCUTANEOUS | Status: DC
  Administered 2017-06-22 – 2017-06-24 (×3): 10 [IU] via SUBCUTANEOUS
  Filled 2017-06-22 (×4): qty 0.1

## 2017-06-22 MED ORDER — INSULIN ASPART 100 UNIT/ML ~~LOC~~ SOLN
6.0000 [IU] | Freq: Three times a day (TID) | SUBCUTANEOUS | Status: DC
  Administered 2017-06-23 – 2017-06-24 (×4): 6 [IU] via SUBCUTANEOUS

## 2017-06-22 MED ORDER — LEVOFLOXACIN IN D5W 750 MG/150ML IV SOLN
750.0000 mg | INTRAVENOUS | Status: DC
  Administered 2017-06-22 – 2017-06-23 (×2): 750 mg via INTRAVENOUS
  Filled 2017-06-22 (×2): qty 150

## 2017-06-22 MED ORDER — INSULIN REGULAR HUMAN 100 UNIT/ML IJ SOLN
INTRAMUSCULAR | Status: AC
  Filled 2017-06-22: qty 1

## 2017-06-22 MED ORDER — ASPIRIN 81 MG PO CHEW
81.0000 mg | CHEWABLE_TABLET | Freq: Every day | ORAL | Status: DC
  Administered 2017-06-23 – 2017-06-24 (×2): 81 mg via ORAL
  Filled 2017-06-22 (×2): qty 1

## 2017-06-22 MED ORDER — SODIUM CHLORIDE 0.9 % IV BOLUS
1000.0000 mL | Freq: Once | INTRAVENOUS | Status: AC
  Administered 2017-06-22: 1000 mL via INTRAVENOUS

## 2017-06-22 MED ORDER — DIAZEPAM 5 MG PO TABS
5.0000 mg | ORAL_TABLET | Freq: Two times a day (BID) | ORAL | Status: DC
  Administered 2017-06-22 – 2017-06-24 (×4): 5 mg via ORAL
  Filled 2017-06-22 (×4): qty 1

## 2017-06-22 MED ORDER — IPRATROPIUM BROMIDE 0.02 % IN SOLN
0.5000 mg | Freq: Four times a day (QID) | RESPIRATORY_TRACT | Status: DC
  Administered 2017-06-22 (×2): 0.5 mg via RESPIRATORY_TRACT
  Filled 2017-06-22 (×2): qty 2.5

## 2017-06-22 MED ORDER — ENOXAPARIN SODIUM 40 MG/0.4ML ~~LOC~~ SOLN
40.0000 mg | SUBCUTANEOUS | Status: DC
  Administered 2017-06-23: 40 mg via SUBCUTANEOUS
  Filled 2017-06-22: qty 0.4

## 2017-06-22 MED ORDER — ONDANSETRON HCL 4 MG PO TABS: 4.0000 mg | ORAL_TABLET | Freq: Four times a day (QID) | ORAL | Status: DC | PRN

## 2017-06-22 MED ORDER — ALLOPURINOL 100 MG PO TABS
100.0000 mg | ORAL_TABLET | Freq: Every day | ORAL | Status: DC
  Administered 2017-06-23 – 2017-06-24 (×2): 100 mg via ORAL
  Filled 2017-06-22 (×3): qty 1

## 2017-06-22 MED ORDER — ALBUTEROL SULFATE (2.5 MG/3ML) 0.083% IN NEBU
5.0000 mg | INHALATION_SOLUTION | Freq: Once | RESPIRATORY_TRACT | Status: AC
  Administered 2017-06-22: 5 mg via RESPIRATORY_TRACT
  Filled 2017-06-22: qty 6

## 2017-06-22 MED ORDER — ONDANSETRON HCL 4 MG/2ML IJ SOLN: 4.0000 mg | Freq: Four times a day (QID) | INTRAMUSCULAR | Status: DC | PRN

## 2017-06-22 MED ORDER — OXYCODONE-ACETAMINOPHEN 5-325 MG PO TABS
1.0000 | ORAL_TABLET | Freq: Four times a day (QID) | ORAL | Status: DC | PRN
  Administered 2017-06-22: 2 via ORAL
  Administered 2017-06-23 (×2): 1 via ORAL
  Filled 2017-06-22: qty 1
  Filled 2017-06-22: qty 2
  Filled 2017-06-22: qty 1

## 2017-06-22 MED ORDER — LEVALBUTEROL HCL 0.63 MG/3ML IN NEBU
0.6300 mg | INHALATION_SOLUTION | Freq: Four times a day (QID) | RESPIRATORY_TRACT | Status: DC
  Administered 2017-06-22 (×2): 0.63 mg via RESPIRATORY_TRACT
  Filled 2017-06-22 (×2): qty 3

## 2017-06-22 MED ORDER — PANTOPRAZOLE SODIUM 40 MG PO TBEC
40.0000 mg | DELAYED_RELEASE_TABLET | Freq: Every day | ORAL | Status: DC
  Administered 2017-06-23 – 2017-06-24 (×2): 40 mg via ORAL
  Filled 2017-06-22 (×2): qty 1

## 2017-06-22 MED ORDER — DICLOFENAC SODIUM ER 100 MG PO TB24: 100.0000 mg | ORAL_TABLET | Freq: Every day | ORAL | Status: DC

## 2017-06-22 MED ORDER — SODIUM CHLORIDE 0.9 % IV SOLN
INTRAVENOUS | Status: AC
  Administered 2017-06-22 (×2): via INTRAVENOUS

## 2017-06-22 MED ORDER — METHYLPREDNISOLONE SODIUM SUCC 125 MG IJ SOLR
60.0000 mg | Freq: Four times a day (QID) | INTRAMUSCULAR | Status: AC
  Administered 2017-06-22 – 2017-06-23 (×4): 60 mg via INTRAVENOUS
  Filled 2017-06-22 (×4): qty 2

## 2017-06-22 MED ORDER — DEXTROSE 50 % IV SOLN: 25.0000 mL | INTRAVENOUS | Status: DC | PRN

## 2017-06-22 MED ORDER — IPRATROPIUM BROMIDE 0.02 % IN SOLN
1.0000 mg | Freq: Once | RESPIRATORY_TRACT | Status: AC
  Administered 2017-06-22: 1 mg via RESPIRATORY_TRACT
  Filled 2017-06-22: qty 5

## 2017-06-22 MED ORDER — LORAZEPAM 2 MG/ML IJ SOLN
1.0000 mg | INTRAMUSCULAR | Status: DC | PRN
  Administered 2017-06-22 – 2017-06-23 (×3): 1 mg via INTRAVENOUS
  Filled 2017-06-22 (×3): qty 1

## 2017-06-22 MED ORDER — METHYLPREDNISOLONE SODIUM SUCC 125 MG IJ SOLR
125.0000 mg | Freq: Once | INTRAMUSCULAR | Status: AC
  Administered 2017-06-22: 125 mg via INTRAVENOUS
  Filled 2017-06-22: qty 2

## 2017-06-22 MED ORDER — METFORMIN HCL 500 MG PO TABS
1000.0000 mg | ORAL_TABLET | Freq: Two times a day (BID) | ORAL | Status: DC
  Administered 2017-06-22 – 2017-06-24 (×4): 1000 mg via ORAL
  Filled 2017-06-22 (×4): qty 2

## 2017-06-22 MED ORDER — ACETAMINOPHEN 325 MG PO TABS: 650.0000 mg | ORAL_TABLET | Freq: Four times a day (QID) | ORAL | Status: DC | PRN

## 2017-06-22 MED ORDER — OXYCODONE HCL 5 MG PO TABS
5.0000 mg | ORAL_TABLET | Freq: Four times a day (QID) | ORAL | Status: DC | PRN
  Administered 2017-06-22 – 2017-06-23 (×3): 10 mg via ORAL
  Filled 2017-06-22 (×3): qty 2

## 2017-06-22 MED ORDER — ALBUTEROL (5 MG/ML) CONTINUOUS INHALATION SOLN
10.0000 mg/h | INHALATION_SOLUTION | Freq: Once | RESPIRATORY_TRACT | Status: AC
  Administered 2017-06-22: 10 mg/h via RESPIRATORY_TRACT
  Filled 2017-06-22: qty 20

## 2017-06-22 MED ORDER — GABAPENTIN 300 MG PO CAPS
300.0000 mg | ORAL_CAPSULE | Freq: Three times a day (TID) | ORAL | Status: DC
  Administered 2017-06-22 – 2017-06-24 (×6): 300 mg via ORAL
  Filled 2017-06-22 (×6): qty 1

## 2017-06-22 MED ORDER — INSULIN ASPART 100 UNIT/ML ~~LOC~~ SOLN
10.0000 [IU] | Freq: Once | SUBCUTANEOUS | Status: AC
  Administered 2017-06-22: 10 [IU] via INTRAVENOUS

## 2017-06-22 NOTE — ED Notes (Signed)
Pt had labs, XRay, breathing tx and has Rx with him as well as C paperwork from [facility]

## 2017-06-22 NOTE — H&P (Addendum)
History and Physical    Raymond [name] [id number] DOB: [birth year] DOA: 06/22/2017  PCP: [name], MD   Patient coming from: Home  Chief Complaint: Persistent dyspnea  HPI: Raymond [name] is a 57 y.o. male with medical history significant for anxiety, GERD, prior CVA, type 2 diabetes, hypertension, and COPD who presented to the emergency department with gradual worsening of cough, wheezing, and dyspnea over the last 1 month that had worsened over the past several days.  He had just left [facility] him earlier this morning after being treated for COPD exacerbation overnight and was noted to have hyperglycemia and was discharged with oral steroid taper as well as Levaquin.  He continues to remain short of breath with no symptomatic improvement noted.  He came to [facility] emergency department out of frustration and persistence of symptoms. Denies chest pain, palpitations, abdominal pain, nausea, vomiting, diarrhea, fever, or chills. States the cough is nonproductive. He is overall feeling quite anxious.   ED Course: Vital signs are stable aside from sinus tachycardia with heart rate in the 110 bpm range.  EKG reflects the same with sinus tachycardia at 118 bpm.  Laboratory data remarkable for mild leukocytosis of 10,000, sodium 130, and glucose 502.  Chest x-ray 1 view with no acute abnormalities.  Troponin negative, and BNP 126.  He is currently in minimal respiratory distress and is being transitioned to high flow nasal cannula.  Patient has received high-dose IV steroids as well as breathing treatments and IV fluid in the ED.  Requested for10 unit IV insulin to be given now in ED.  Review of Systems: All others reviewed and otherwise negative.  Past Medical History:  Diagnosis Date  . Anxiety   . Back pain   . Bipolar disorder ([place])   . Borderline hypertension   . Chronic pain   . COPD (chronic obstructive pulmonary disease) ([place])   . Depression with anxiety   . GERD  (gastroesophageal reflux disease)   . Headache   . Hip pain   . Hypercholesterolemia   . Stroke ([place])   . Tobacco use     Past Surgical History:  Procedure Laterality Date  . ANTERIOR FUSION CERVICAL SPINE    . BACK SURGERY    . CHOLECYSTECTOMY    . COLONOSCOPY N/A 10/28/2012   GYJ:EHUDJS polyp-hyperplastic. Internal hemorrhoids. Distal 5 cm of terminal ileum appeared normal.  . ESOPHAGOGASTRODUODENOSCOPY N/A 10/28/2012   HFW:YOVZCHYI gastric mucosa with mottling and submucosal petechiae. Mild chronic gastritis but no H. pylori home path. Small hiatal hernia. Duodenal lipoma  . EYE SURGERY  Left eye   as a child  . LUMBAR SPINE SURGERY     3 x      reports that he has been smoking cigarettes.  He has a 60.00 pack-year smoking history. He has never used smokeless tobacco. He reports that he does not drink alcohol or use drugs.  Allergies  Allergen Reactions  . Penicillins Anaphylaxis  . Latex   . Morphine And Related   . Vicodin [Hydrocodone-Acetaminophen]     Family History  Problem Relation Age of Onset  . Stomach cancer Mother   . Throat cancer Father     Prior to Admission medications   Medication Sig Start Date End Date Taking? Authorizing Provider  albuterol (PROVENTIL HFA;VENTOLIN HFA) 108 (90 BASE) MCG/ACT inhaler Inhale 2 puffs into the lungs every 6 (six) hours as needed for wheezing.   Yes [provider]  albuterol (PROVENTIL) (2.5 MG/3ML)  0.083% nebulizer solution  10/13/12  Yes [provider]  allopurinol (ZYLOPRIM) 100 MG tablet Take 100 mg by mouth daily.  10/13/12  Yes [provider]  aspirin 81 MG tablet Take 81 mg by mouth daily.   Yes [provider]  cyclobenzaprine (FLEXERIL) 10 MG tablet Take 10 mg by mouth 3 (three) times daily as needed.  10/13/12  Yes [provider]  Diclofenac Sodium CR 100 MG 24 hr tablet Take 100 mg by mouth daily.  10/13/12  Yes [provider]  fentaNYL (DURAGESIC - DOSED  MCG/HR) 50 MCG/HR Place 1 patch onto the skin every 3 (three) days.   Yes [provider]  folic acid (FOLVITE) 1 MG tablet Take 1 mg by mouth daily.   Yes [provider]  gabapentin (NEURONTIN) 300 MG capsule Take 300 mg by mouth 3 (three) times daily.   Yes [provider]  metFORMIN (GLUCOPHAGE) 500 MG tablet Take 2 tablets by mouth 2 (two) times daily.   Yes [provider]  omeprazole (PRILOSEC) 20 MG capsule TAKE (1) CAPSULE BY MOUTH ONCE DAILY. 05/10/14  Yes Annitta Needs, NP  oxyCODONE-acetaminophen (PERCOCET) 10-325 MG per tablet Take 1-2 tablets by mouth every 6 (six) hours as needed for pain.  09/24/12  Yes [provider]  SPIRIVA HANDIHALER 18 MCG inhalation capsule Place 18 mcg into inhaler and inhale daily.  10/13/12  Yes [provider]  tamsulosin (FLOMAX) 0.4 MG CAPS capsule Take 0.4 mg by mouth daily after supper.   Yes [provider]  albuterol (PROVENTIL) 2 MG tablet Take 2 mg by mouth 3 (three) times daily.    [provider]  diazepam (VALIUM) 5 MG tablet Take 5 mg by mouth 2 (two) times daily.  09/25/12   [provider]    Physical Exam: Vitals:   06/22/17 1300 06/22/17 1311 06/22/17 1331 06/22/17 1340  BP: 123/70 123/70 115/77   Pulse: (!) 122 (!) 123 (!) 119 (!) 114  Resp: 15 (!) 24 (!) 24 20  Temp:      TempSrc:      SpO2: 94% 96% 93% 93%  Weight:      Height:        Constitutional: NAD, calm, comfortable Vitals:   06/22/17 1300 06/22/17 1311 06/22/17 1331 06/22/17 1340  BP: 123/70 123/70 115/77   Pulse: (!) 122 (!) 123 (!) 119 (!) 114  Resp: 15 (!) 24 (!) 24 20  Temp:      TempSrc:      SpO2: 94% 96% 93% 93%  Weight:      Height:       Eyes: lids and conjunctivae normal ENMT: Mucous membranes are moist.  Neck: normal, supple Respiratory: clear to auscultation bilaterally. Normal respiratory effort. No accessory muscle use.  Cardiovascular: Regular rate and rhythm, no  murmurs. No extremity edema. Abdomen: no tenderness, no distention. Bowel sounds positive.  Musculoskeletal:  No joint deformity upper and lower extremities.   Skin: no rashes, lesions, ulcers.  Psychiatric: Normal judgment and insight. Alert and oriented x 3. Normal mood.   Labs on Admission: I have personally reviewed following labs and imaging studies  CBC: Recent Labs  Lab 06/22/17 1133  WBC 10.9*  NEUTROABS 9.1*  HGB 14.7  HCT 44.5  MCV 89.9  PLT 627   Basic Metabolic Panel: Recent Labs  Lab 06/22/17 1133  NA 130*  K 4.6  CL 96*  CO2 20*  GLUCOSE 502*  BUN 10  CREATININE 1.10  CALCIUM 8.6*   GFR: Estimated Creatinine Clearance: 72.5 mL/min (by C-G formula based on SCr of 1.1 mg/dL). Liver Function Tests: No results for input(s): AST, ALT, ALKPHOS, BILITOT, PROT, ALBUMIN in the last 168 hours. No results for input(s): LIPASE, AMYLASE in the last 168 hours. No results for input(s): AMMONIA in the last 168 hours. Coagulation Profile: No results for input(s): INR, PROTIME in the last 168 hours. Cardiac Enzymes: Recent Labs  Lab 06/22/17 1133  TROPONINI <0.03   BNP (last 3 results) No results for input(s): PROBNP in the last 8760 hours. HbA1C: No results for input(s): HGBA1C in the last 72 hours. CBG: Recent Labs  Lab 06/22/17 1134 06/22/17 1331  GLUCAP 533* 511*   Lipid Profile: No results for input(s): CHOL, HDL, LDLCALC, TRIG, CHOLHDL, LDLDIRECT in the last 72 hours. Thyroid Function Tests: No results for input(s): TSH, T4TOTAL, FREET4, T3FREE, THYROIDAB in the last 72 hours. Anemia Panel: No results for input(s): VITAMINB12, FOLATE, FERRITIN, TIBC, IRON, RETICCTPCT in the last 72 hours. Urine analysis:    Component Value Date/Time   COLORURINE YELLOW 11/11/2009 1455   APPEARANCEUR CLEAR 11/11/2009 1455   LABSPEC <1.005 (L) 11/11/2009 1455   PHURINE 6.5 11/11/2009 1455   GLUCOSEU NEGATIVE 11/11/2009 1455   HGBUR NEGATIVE 11/11/2009 1455    BILIRUBINUR NEGATIVE 11/11/2009 1455   KETONESUR NEGATIVE 11/11/2009 1455   PROTEINUR NEGATIVE 11/11/2009 1455   UROBILINOGEN 0.2 11/11/2009 1455   NITRITE NEGATIVE 11/11/2009 1455   LEUKOCYTESUR  11/11/2009 1455    NEGATIVE MICROSCOPIC NOT DONE ON URINES WITH NEGATIVE PROTEIN, BLOOD, LEUKOCYTES, NITRITE, OR GLUCOSE <1000 mg/dL.    Radiological Exams on Admission: Dg Chest Port 1 View  Result Date: 06/22/2017 CLINICAL DATA:  Shortness of breath, smoker, trouble breathing for 2 months, history COPD, GERD EXAM: PORTABLE CHEST 1 VIEW COMPARISON:  Portable exam 1234 hours compared to 06/21/2017 FINDINGS: Normal heart size, mediastinal contours, and pulmonary vascularity. Lungs clear. No acute infiltrate, pleural effusion or pneumothorax. Prior cervical spine fusion. No acute osseous findings. IMPRESSION: No acute abnormalities. Electronically Signed   By: Lavonia Dana M.D.   On: 06/22/2017 12:54    EKG: Independently reviewed.  Sinus tachycardia at 118 bpm.  Assessment/Plan Principal Problem:   COPD with acute exacerbation (HCC) Active Problems:   GERD (gastroesophageal reflux disease)   Hyperglycemia   Anxiety   Type 2 diabetes mellitus without complication ([place])    1. Acute hypoxemic respiratory failure secondary to persistent COPD exacerbation.  Maintain on IV Solu-Medrol due to persistent bronchospasms and keep dosage minimal due to hyperglycemia.  Pulmicort twice daily.  Duonebs every 6 hours with Xopenex/Atrovent.  Maintain on IV Levaquin. 2. Hyperglycemia likely steroid-induced in setting of type 2 diabetes.  Maintain on oral metformin and carb modified diet.  Sliding scale insulin to high dose with every 4 hours checks.  Levemir 10 units subcu daily starting now.  Check A1c.  Maintain on time-limited, gentle IV normal saline. 3. Anxiety.  Acutely worsen secondary to above process.  Will maintain on IV Ativan as needed for anxiety control. 4. GERD.  Maintain on PPI. 5. Lumbago with  neuropathy.  Maintain on gabapentin and oral narcotics. 6. Tobacco abuse.  Discussed cessation.  Maintain on nicotine patch.  Critical Care time of 40 minutes.  DVT prophylaxis: Lovenox Code Status: Full Family Communication: Son at bedside Disposition Plan: COPD exacerbation treatment with weaning of oxygen, treatment of hyperglycemia Consults called: None Admission status: Inpatient, stepdown unit   Nori Poland  Darleen Crocker DO [facility] Pager [phone]  If 7PM-7AM, please contact night-coverage [email or web address] Password [email or web address]  06/22/2017, 1:46 PM

## 2017-06-22 NOTE — ED Notes (Signed)
RT arrived at bedside to place pt on bipap.  Initially unable to tolerate, but after using mouth gel and switching to smaller mask began tolerating well. Will continue to monitor.  Pt remains alert at this time.

## 2017-06-22 NOTE — ED Triage Notes (Signed)
Pt has discharge paperwork as well as prescription from [facility] from which he was just discharged  Smokes 1 PPD x 30 years and had trouble breathing for the last 2 months  Here for eval due to "they didn't do nothing" per pt

## 2017-06-22 NOTE — ED Notes (Signed)
CRITICAL VALUE ALERT  Critical Value:  Glucose 502  Date & Time Notied:  06/22/17, 1222  Provider Notified: Dr. Thurnell Garbe  Orders Received/Actions taken: no new orders

## 2017-06-22 NOTE — ED Notes (Signed)
Pt calling out from room.  Very short of breath and in significant distress.  Audible wheezing noted.  Pt gasping and stating "I can't breathe."  Got pt situated back in bed, RT paged, and MD notified.  Order for bipap obtained.  Pt initially hyperventilating and maintaining good O2 sat, but began to decompensate with hypoventilation and became drowsy with one period of apnea.  Pt aroused to verbal stimuli and began breathing again.

## 2017-06-22 NOTE — ED Provider Notes (Signed)
[facility] Provider Note   CSN: [id number] Arrival date & time: 06/22/17  1107     History   Chief Complaint Chief Complaint  Patient presents with  . Shortness of Breath    HPI TAKEEM Raymond [name] is a 57 y.o. male.  HPI  Pt was seen at 1145.  Per pt, c/o gradual onset and worsening of persistent cough, wheezing and SOB for the past 1 month, worse over the past several days. Pt states he has been evaluated at [facility] multiple times for this complaint, including PTA. Pt states "they didn't do nothing and I'm still SOB." Pt endorses he continues to smoke 2ppd cigarettes.   Denies CP/palpitations, no back pain, no abd pain, no N/V/D, no fevers, no rash.    Past Medical History:  Diagnosis Date  . Anxiety   . Back pain   . Bipolar disorder ([place])   . Borderline hypertension   . Chronic pain   . COPD (chronic obstructive pulmonary disease) ([place])   . Depression with anxiety   . GERD (gastroesophageal reflux disease)   . Headache   . Hip pain   . Hypercholesterolemia   . Stroke ([place])   . Tobacco use     Patient Active Problem List   Diagnosis Date Noted  . CVA (cerebral infarction) 11/10/2013  . OA (osteoarthritis) of knee 02/24/2013  . Chronic pain syndrome 02/24/2013  . Spinal stenosis, lumbar region, with neurogenic claudication 02/24/2013  . Gastritis 11/11/2012  . Abnormal weight loss 11/11/2012  . GERD (gastroesophageal reflux disease) 10/15/2012  . Abdominal pain, other specified site 10/15/2012    Past Surgical History:  Procedure Laterality Date  . ANTERIOR FUSION CERVICAL SPINE    . BACK SURGERY    . CHOLECYSTECTOMY    . COLONOSCOPY N/A 10/28/2012   NWG:NFAOZH polyp-hyperplastic. Internal hemorrhoids. Distal 5 cm of terminal ileum appeared normal.  . ESOPHAGOGASTRODUODENOSCOPY N/A 10/28/2012   YQM:VHQIONGE gastric mucosa with mottling and submucosal petechiae. Mild chronic gastritis but no H. pylori home path. Small hiatal hernia.  Duodenal lipoma  . EYE SURGERY  Left eye   as a child  . LUMBAR SPINE SURGERY     3 x         Home Medications    Prior to Admission medications   Medication Sig Start Date End Date Taking? Authorizing Provider  albuterol (PROVENTIL HFA;VENTOLIN HFA) 108 (90 BASE) MCG/ACT inhaler Inhale 2 puffs into the lungs every 6 (six) hours as needed for wheezing.    [provider]  albuterol (PROVENTIL) (2.5 MG/3ML) 0.083% nebulizer solution  10/13/12   [provider]  albuterol (PROVENTIL) 2 MG tablet Take 2 mg by mouth 3 (three) times daily.    [provider]  allopurinol (ZYLOPRIM) 100 MG tablet Take 100 mg by mouth daily.  10/13/12   [provider]  aspirin 81 MG tablet Take 81 mg by mouth daily.    [provider]  cyclobenzaprine (FLEXERIL) 10 MG tablet Take 10 mg by mouth 3 (three) times daily as needed.  10/13/12   [provider]  diazepam (VALIUM) 5 MG tablet Take 5 mg by mouth 2 (two) times daily.  09/25/12   [provider]  Diclofenac Sodium CR 100 MG 24 hr tablet Take 100 mg by mouth daily.  10/13/12   [provider]  fentaNYL (DURAGESIC - DOSED MCG/HR) 50 MCG/HR Place 1 patch onto the skin every 3 (three) days.    [provider]  gabapentin (NEURONTIN) 300 MG capsule Take 300 mg by mouth 3 (three) times daily.    [provider]  omeprazole (PRILOSEC) 20 MG capsule TAKE (1) CAPSULE BY MOUTH ONCE DAILY. 05/10/14   Annitta Needs, NP  oxyCODONE-acetaminophen (PERCOCET) 10-325 MG per tablet Take 1-2 tablets by mouth every 6 (six) hours as needed for pain.  09/24/12   [provider]  promethazine (PHENERGAN) 25 MG tablet Take 25 mg by mouth every 6 (six) hours as needed for nausea.    [provider]  ranitidine (ZANTAC) 150 MG tablet Take 150 mg by mouth daily.    [provider]  SPIRIVA HANDIHALER 18 MCG inhalation capsule Place 18 mcg into inhaler and inhale daily.  10/13/12    [provider]    Family History Family History  Problem Relation Age of Onset  . Stomach cancer Mother   . Throat cancer Father     Social History Social History   Tobacco Use  . Smoking status: Current Every Day Smoker    Packs/day: 2.00    Years: 30.00    Pack years: 60.00    Types: Cigarettes  . Smokeless tobacco: Never Used  Substance Use Topics  . Alcohol use: No    Alcohol/week: 0.0 oz    Comment: history of ETOH abuse in past, none in 4 years  . Drug use: No     Allergies   Penicillins; Latex; Morphine and related; and Vicodin [hydrocodone-acetaminophen]   Review of Systems Review of Systems ROS: Statement: All systems negative except as marked or noted in the HPI; Constitutional: Negative for fever and chills. ; ; Eyes: Negative for eye pain, redness and discharge. ; ; ENMT: Negative for ear pain, hoarseness, nasal congestion, sinus pressure and sore throat. ; ; Cardiovascular: Negative for chest pain, palpitations, diaphoresis, and peripheral edema. ; ; Respiratory: +SOB, wheezing, cough. Negative for stridor. ; ; Gastrointestinal: Negative for nausea, vomiting, diarrhea, abdominal pain, blood in stool, hematemesis, jaundice and rectal bleeding. . ; ; Genitourinary: Negative for dysuria, flank pain and hematuria. ; ; Musculoskeletal: Negative for back pain and neck pain. Negative for swelling and trauma.; ; Skin: Negative for pruritus, rash, abrasions, blisters, bruising and skin lesion.; ; Neuro: Negative for headache, lightheadedness and neck stiffness. Negative for weakness, altered level of consciousness, altered mental status, extremity weakness, paresthesias, involuntary movement, seizure and syncope.       Physical Exam Updated Vital Signs BP 137/77 (BP Location: Right Arm)   Pulse (!) 121   Temp 98.2 F (36.8 C) (Oral)   Resp (!) 24   Ht 5\' 8"  (1.727 m)   Wt 80.7 kg (178 lb)   SpO2 95%   BMI 27.06 kg/m    Patient Vitals for the past 24  hrs:  BP Temp Temp src Pulse Resp SpO2 Height Weight  06/22/17 1430 (!) 115/98 - - (!) 117 20 95 % - -  06/22/17 1400 120/83 - - (!) 118 (!) 21 97 % - -  06/22/17 1340 - - - (!) 114 20 93 % - -  06/22/17 1331 115/77 - - (!) 119 (!) 24 93 % - -  06/22/17 1311 123/70 - - (!) 123 (!) 24 96 % - -  06/22/17 1300 123/70 - - (!) 122 15 94 % - -  06/22/17 1230 (!) 158/92 - - (!) 126 (!) 28 94 % - -  06/22/17 1201 - - - - - 90 % - -  06/22/17 1200  139/80 - - (!) 116 17 94 % - -  06/22/17 1145 - - - (!) 107 20 98 % - -  06/22/17 1141 - - - - - 92 % - -  06/22/17 1131 - - - - - 95 % - -  06/22/17 1130 - - - - (!) 24 - - -  06/22/17 1115 - - - - - - 5\' 8"  (1.727 m) 80.7 kg (178 lb)  06/22/17 1113 137/77 98.2 F (36.8 C) Oral (!) 121 (!) 24 (!) 89 % - -  06/22/17 1112 - - - - - (!) 86 % - -     Physical Exam 1150: Physical examination:  Nursing notes reviewed; Vital signs and O2 SAT reviewed;  Constitutional: Well developed, Well nourished, Well hydrated, Uncomfortable appearing.;; Head:  Normocephalic, atraumatic; Eyes: EOMI, PERRL, No scleral icterus; ENMT: Mouth and pharynx normal, Mucous membranes moist; Neck: Supple, Full range of motion, No lymphadenopathy; Cardiovascular: Tachycardic rate and rhythm, No gallop; Respiratory: Breath sounds coarse & equal bilaterally, insp/exp wheezes bilat. Faint audible wheezing.  Speaking short sentences, sitting upright, tachypneic.; Chest: Nontender, Movement normal; Abdomen: Soft, Nontender, Nondistended, Normal bowel sounds; Genitourinary: No CVA tenderness; Extremities: Pulses normal, No tenderness, No edema, No calf edema or asymmetry.; Neuro: AA&Ox3, Major CN grossly intact.  Speech clear. No gross focal motor or sensory deficits in extremities.; Skin: Color normal, Warm, Dry.    ED Treatments / Results  Labs (all labs ordered are listed, but only abnormal results are displayed)   EKG EKG Interpretation  Date/Time:  Sunday June 22 2017 11:49:45  EDT Ventricular Rate:  118 PR Interval:    QRS Duration: 94 QT Interval:  320 QTC Calculation: 449 R Axis:   40 Text Interpretation:  Sinus or ectopic atrial tachycardia Low voltage, extremity and precordial leads Baseline wander Artifact When compared with ECG of 01/22/2009 No significant change was found Confirmed by Francine Graven [phone]) on 06/22/2017 11:55:00 AM   Radiology   Procedures Procedures (including critical care time)  Medications Ordered in ED Medications  albuterol (PROVENTIL,VENTOLIN) solution continuous neb (has no administration in time range)  ipratropium (ATROVENT) nebulizer solution 1 mg (has no administration in time range)  methylPREDNISolone sodium succinate (SOLU-MEDROL) 125 mg/2 mL injection 125 mg (has no administration in time range)  albuterol (PROVENTIL) (2.5 MG/3ML) 0.083% nebulizer solution 5 mg (5 mg Nebulization Given 06/22/17 1131)     Initial Impression / Assessment and Plan / ED Course  I have reviewed the triage vital signs and the nursing notes.  Pertinent labs & imaging results that were available during my care of the patient were reviewed by me and considered in my medical decision making (see chart for details).  MDM Reviewed: previous chart, nursing note and vitals Reviewed previous: labs and ECG Interpretation: labs, ECG and x-ray Total time providing critical care: 30-74 minutes. This excludes time spent performing separately reportable procedures and services. Consults: admitting MD   CRITICAL CARE Performed by: Alfonzo Feller Total critical care time: 45 minutes Critical care time was exclusive of separately billable procedures and treating other patients. Critical care was necessary to treat or prevent imminent or life-threatening deterioration. Critical care was time spent personally by me on the following activities: development of treatment plan with patient and/or surrogate as well as nursing, discussions with  consultants, evaluation of patient's response to treatment, examination of patient, obtaining history from patient or surrogate, ordering and performing treatments and interventions, ordering and review of laboratory studies, ordering and  review of radiographic studies, pulse oximetry and re-evaluation of patient's condition.  Results for orders placed or performed during the hospital encounter of 94/76/54  Basic metabolic panel  Result Value Ref Range   Sodium 130 (L) 135 - 145 mmol/L   Potassium 4.6 3.5 - 5.1 mmol/L   Chloride 96 (L) 101 - 111 mmol/L   CO2 20 (L) 22 - 32 mmol/L   Glucose, Bld 502 (HH) 65 - 99 mg/dL   BUN 10 6 - 20 mg/dL   Creatinine, Ser 1.10 0.61 - 1.24 mg/dL   Calcium 8.6 (L) 8.9 - 10.3 mg/dL   GFR calc non Af Amer >60 >60 mL/min   GFR calc Af Amer >60 >60 mL/min   Anion gap 14 5 - 15  Troponin I  Result Value Ref Range   Troponin I <0.03 <0.03 ng/mL  CBC with Differential  Result Value Ref Range   WBC 10.9 (H) 4.0 - 10.5 K/uL   RBC 4.95 4.22 - 5.81 MIL/uL   Hemoglobin 14.7 13.0 - 17.0 g/dL   HCT 44.5 39.0 - 52.0 %   MCV 89.9 78.0 - 100.0 fL   MCH 29.7 26.0 - 34.0 pg   MCHC 33.0 30.0 - 36.0 g/dL   RDW 14.2 11.5 - 15.5 %   Platelets 292 150 - 400 K/uL   Neutrophils Relative % 83 %   Neutro Abs 9.1 (H) 1.7 - 7.7 K/uL   Lymphocytes Relative 12 %   Lymphs Abs 1.3 0.7 - 4.0 K/uL   Monocytes Relative 5 %   Monocytes Absolute 0.5 0.1 - 1.0 K/uL   Eosinophils Relative 0 %   Eosinophils Absolute 0.0 0.0 - 0.7 K/uL   Basophils Relative 0 %   Basophils Absolute 0.0 0.0 - 0.1 K/uL  Brain natriuretic peptide  Result Value Ref Range   B Natriuretic Peptide 126.0 (H) 0.0 - 100.0 pg/mL  CBG monitoring, ED  Result Value Ref Range   Glucose-Capillary 533 (HH) 65 - 99 mg/dL  CBG monitoring, ED  Result Value Ref Range   Glucose-Capillary 511 (HH) 65 - 99 mg/dL  CBG monitoring, ED  Result Value Ref Range   Glucose-Capillary 319 (H) 65 - 99 mg/dL   Dg Chest Port 1  View Result Date: 06/22/2017 CLINICAL DATA:  Shortness of breath, smoker, trouble breathing for 2 months, history COPD, GERD EXAM: PORTABLE CHEST 1 VIEW COMPARISON:  Portable exam 1234 hours compared to 06/21/2017 FINDINGS: Normal heart size, mediastinal contours, and pulmonary vascularity. Lungs clear. No acute infiltrate, pleural effusion or pneumothorax. Prior cervical spine fusion. No acute osseous findings. IMPRESSION: No acute abnormalities. Electronically Signed   By: Lavonia Dana M.D.   On: 06/22/2017 12:54    1330:  On arrival: pt sitting upright, tachypneic, tachycardic, Sats 86 % R/A, lungs diminished with wheezing. IV solumedrol and hour long neb started. After neb: continues tachypneic, scattered wheezing, Sats 93-96 % on O2 4L N/C, sitting upright, though is able to speak in short sentences with ED staff. Bipap placed with improvement in tachypnea and SOB. Resps easier, less tachypneic, Sats 95-97% on bipap. CBG elevated, but pt not acidotic with normal AG. Pt states his CBG "has been high" and endorses compliance with metformin. IVF bolus given followed by IV insulin. Dx and testing d/w pt and family.  Questions answered.  Verb understanding, agreeable to admit. T/C returned from [facility] Dr. Manuella Ghazi, case discussed, including:  HPI, pertinent PM/SHx, VS/PE, dx testing, ED course and treatment:  Agreeable to admit,  requests IV insulin x1 dose, cancel gtt and he will place SQ insulin orders.       Final Clinical Impressions(s) / ED Diagnoses   Final diagnoses:  None    ED Discharge Orders    None       Francine Graven, DO 06/25/17 1746

## 2017-06-23 LAB — BASIC METABOLIC PANEL
Anion gap: 10 (ref 5–15)
BUN: 15 mg/dL (ref 6–20)
CO2: 25 mmol/L (ref 22–32)
Calcium: 8.3 mg/dL — ABNORMAL LOW (ref 8.9–10.3)
Chloride: 98 mmol/L — ABNORMAL LOW (ref 101–111)
Creatinine, Ser: 1.29 mg/dL — ABNORMAL HIGH (ref 0.61–1.24)
GFR calc Af Amer: >60 mL/min (ref >60)
GFR calc non Af Amer: >60 mL/min (ref >60)
Glucose, Bld: 201 mg/dL — ABNORMAL HIGH (ref 65–99)
Potassium: 5.7 mmol/L — ABNORMAL HIGH (ref 3.5–5.1)
Sodium: 133 mmol/L — ABNORMAL LOW (ref 135–145)

## 2017-06-23 LAB — HEMOGLOBIN A1C
Hgb A1c MFr Bld: 7.9 % — ABNORMAL HIGH (ref 4.8–5.6)
Mean Plasma Glucose: 180.03 mg/dL

## 2017-06-23 LAB — CBC
HCT: 41.4 % (ref 39.0–52.0)
Hemoglobin: 13.1 g/dL (ref 13.0–17.0)
MCH: 29.2 pg (ref 26.0–34.0)
MCHC: 31.6 g/dL (ref 30.0–36.0)
MCV: 92.4 fL (ref 78.0–100.0)
Platelets: 290 10*3/uL (ref 150–400)
RBC: 4.48 MIL/uL (ref 4.22–5.81)
RDW: 14.3 % (ref 11.5–15.5)
WBC: 16.7 10*3/uL — ABNORMAL HIGH (ref 4.0–10.5)

## 2017-06-23 LAB — MRSA PCR SCREENING: MRSA by PCR: NEGATIVE

## 2017-06-23 LAB — GLUCOSE, CAPILLARY
Glucose-Capillary: 241 mg/dL — ABNORMAL HIGH (ref 65–99)
Glucose-Capillary: 288 mg/dL — ABNORMAL HIGH (ref 65–99)
Glucose-Capillary: 137 mg/dL — ABNORMAL HIGH (ref 65–99)
Glucose-Capillary: 134 mg/dL — ABNORMAL HIGH (ref 65–99)

## 2017-06-23 MED ORDER — IPRATROPIUM BROMIDE 0.02 % IN SOLN
0.5000 mg | RESPIRATORY_TRACT | Status: DC
  Administered 2017-06-23 – 2017-06-24 (×8): 0.5 mg via RESPIRATORY_TRACT
  Filled 2017-06-23 (×8): qty 2.5

## 2017-06-23 MED ORDER — SODIUM POLYSTYRENE SULFONATE 15 GM/60ML PO SUSP
30.0000 g | Freq: Once | ORAL | Status: AC
  Administered 2017-06-23: 30 g via ORAL
  Filled 2017-06-23: qty 120

## 2017-06-23 MED ORDER — SALINE SPRAY 0.65 % NA SOLN
1.0000 | NASAL | Status: DC | PRN
  Administered 2017-06-23: 1 via NASAL
  Filled 2017-06-23: qty 44

## 2017-06-23 MED ORDER — GUAIFENESIN ER 600 MG PO TB12
600.0000 mg | ORAL_TABLET | Freq: Two times a day (BID) | ORAL | Status: DC
  Administered 2017-06-23 – 2017-06-24 (×3): 600 mg via ORAL
  Filled 2017-06-23 (×3): qty 1

## 2017-06-23 MED ORDER — LEVALBUTEROL HCL 1.25 MG/0.5ML IN NEBU
INHALATION_SOLUTION | RESPIRATORY_TRACT | Status: AC
  Administered 2017-06-23: 1.25 mg
  Filled 2017-06-23: qty 0.5

## 2017-06-23 MED ORDER — LEVALBUTEROL HCL 1.25 MG/0.5ML IN NEBU
1.2500 mg | INHALATION_SOLUTION | RESPIRATORY_TRACT | Status: DC
  Administered 2017-06-23 – 2017-06-24 (×8): 1.25 mg via RESPIRATORY_TRACT
  Filled 2017-06-23 (×8): qty 0.5

## 2017-06-23 MED ORDER — IPRATROPIUM BROMIDE 0.02 % IN SOLN
RESPIRATORY_TRACT | Status: AC
  Administered 2017-06-23: 0.5 mg
  Filled 2017-06-23: qty 2.5

## 2017-06-23 MED ORDER — DICLOFENAC SODIUM 50 MG PO TBEC
50.0000 mg | DELAYED_RELEASE_TABLET | Freq: Two times a day (BID) | ORAL | Status: DC
  Administered 2017-06-23 – 2017-06-24 (×3): 50 mg via ORAL
  Filled 2017-06-23 (×5): qty 1

## 2017-06-23 NOTE — Progress Notes (Signed)
Initial Nutrition Assessment  DOCUMENTATION CODES:      INTERVENTION:  Limit high potassium foods (potatoes, prunes, bananas, oranges, tomatoes)-   Increase intake of Vitamin C containing foods daily due to chronic smoking habit (incluing- red peppers, citrus, melons, leafy vegetables).   Encouraged dietary compliance with CHO modified/ Heart Healthy diet   Nutrition handout and review provided as noted above.   NUTRITION DIAGNOSIS:   Food and nutrition related knowledge deficit related to acute illness as evidenced by per patient/family report.   GOAL: Dietary compliance     MONITOR: Po intake, labs, CBG's,  and wt trends     REASON FOR ASSESSMENT:   Consult Assessment of nutrition requirement/status, COPD Protocol  ASSESSMENT:  The patient is a 57 yo with hx of COPD, GERD, Stroke, Depression and Bipolar disorder.  He is an active tobacco smoker- 2 packs daily x 60 years. Former ETOH abuse (4 years ago).   Home diet: Report by pt as regular. He salts his food, eats out frequently, eats fresh fruits daily and drinks regular [employer]. He "watches intake of breads and potatoes so he can drink his Dew".  His last A1C- 7.9% up from 6% back in November 2018.   Physically: ample fat, no edema and mild temporal muscle loss. Edentulous - has dentures at home but they do not fit properly. The patient says he ambulates independently and is not on continuous oxygen at home. His weight is stable and has been the past 5 years between 183-187 lb.    Labs: BMP Latest Ref Rng & Units 06/23/2017 06/22/2017 09/25/2012  Glucose 65 - 99 mg/dL 201(H) 502(HH) -  BUN 6 - 20 mg/dL 15 10 12   Creatinine 0.61 - 1.24 mg/dL 1.29(H) 1.10 1.09  Sodium 135 - 145 mmol/L 133(L) 130(L) 141  Potassium 3.5 - 5.1 mmol/L 5.7(H) 4.6 4.1  Chloride 101 - 111 mmol/L 98(L) 96(L) -  CO2 22 - 32 mmol/L 25 20(L) -  Calcium 8.9 - 10.3 mg/dL 8.3(L) 8.6(L) 8.9    Meds: folic acid, PPI, Insulin, Nicoderm,  Prednisone, Levaquin.  [name] allopurinol  100 mg Oral Daily  . aspirin  81 mg Oral Daily  . budesonide (PULMICORT) nebulizer solution  0.25 mg Nebulization BID  . diazepam  5 mg Oral BID  . diclofenac  50 mg Oral BID  . enoxaparin (LOVENOX) injection  40 mg Subcutaneous Q24H  . [START ON 06/25/2017] fentaNYL  50 mcg Transdermal Q72H  . folic acid  1 mg Oral Daily  . gabapentin  300 mg Oral TID  . guaiFENesin  600 mg Oral BID  . insulin aspart  0-20 Units Subcutaneous TID WC  . insulin aspart  0-5 Units Subcutaneous QHS  . insulin aspart  6 Units Subcutaneous TID WC  . insulin detemir  10 Units Subcutaneous Daily  . ipratropium  0.5 mg Nebulization Q4H  . levalbuterol  1.25 mg Nebulization Q4H  . metFORMIN  1,000 mg Oral BID WC  . nicotine  14 mg Transdermal Daily  . pantoprazole  40 mg Oral Daily  . tamsulosin  0.4 mg Oral QPC supper    NUTRITION - FOCUSED PHYSICAL EXAM:    Most Recent Value  Orbital Region  No depletion  Upper Arm Region  No depletion  Thoracic and Lumbar Region  No depletion  Buccal Region  No depletion  Temple Region  Mild depletion  Clavicle Bone Region  No depletion  Clavicle and Acromion Bone Region  No depletion  Scapular  Bone Region  No depletion  Dorsal Hand  No depletion  Patellar Region  Mild depletion  Anterior Thigh Region  No depletion  Posterior Calf Region  No depletion  Edema (RD Assessment)  None  Hair  Reviewed  Eyes  Reviewed  Mouth  Reviewed  Skin  Reviewed  Nails  Reviewed       Diet Order:  Diet heart healthy/carb modified Room service appropriate? Yes; Fluid consistency: Thin  EDUCATION NEEDS:   Education needs have been addressed Skin:  Skin Assessment: Reviewed RN Assessment  Last BM:  4/13  Height:   Ht Readings from Last 1 Encounters:  06/22/17 5\' 8"  (1.727 m)    Weight:   Wt Readings from Last 1 Encounters:  06/23/17 183 lb 3.2 oz (83.1 kg)    Ideal Body Weight:  70 kg  BMI:  Body mass index is 27.86  kg/m.  Estimated Nutritional Needs:   Kcal:  5885-0277  Protein:  90-99 gr  Fluid:  >2000 ml daily   Colman Cater MS,RD,CSG,LDN Office: [phone] Pager: [phone]

## 2017-06-23 NOTE — Progress Notes (Signed)
Patient has around 500 dollars in cash in jeans pocket. Pt was advised to allow [place] to lock it away with secruity in the safe and the patient refused the offer stating he was waiting for his daughter to come pick it up.

## 2017-06-23 NOTE — Progress Notes (Signed)
Have increased patients nebs to Q 4 with 1.25mg  Xopenex and Atrovent 0.5mg  for wheezing. Patient has an extremely long exhalation phase.

## 2017-06-23 NOTE — Progress Notes (Addendum)
PROGRESS NOTE    Raymond [name]  YYT:[id number] DOB: May 28, 1960 DOA: 06/22/2017 PCP: [name], MD   Brief Narrative:   Raymond [name] is a 57 y.o. male with medical history significant for anxiety, GERD, prior CVA, type 2 diabetes, hypertension, and COPD who presented to the emergency department with gradual worsening of cough, wheezing, and dyspnea over the last 1 month that had worsened over the past several days.  He had just left [facility] him earlier this morning after being treated for COPD exacerbation overnight and was noted to have hyperglycemia and was discharged with oral steroid taper as well as Levaquin.  He has been admitted with a recurrence of his COPD exacerbation with acute hypoxemic respiratory failure.   Assessment & Plan:   Principal Problem:   COPD with acute exacerbation ([place]) Active Problems:   GERD (gastroesophageal reflux disease)   Hyperglycemia   Anxiety   Type 2 diabetes mellitus without complication (HCC)   COPD exacerbation ([place])   1. Acute hypoxemic respiratory failure secondary to persistent COPD exacerbation.  Maintain on IV Solu-Medrol due to persistent bronchospasms and keep dosage minimal due to hyperglycemia.  Pulmicort twice daily.  Duonebs every 6 hours with Xopenex/Atrovent.  Maintain on IV Levaquin. Mucinex and Flutter valve added on today. Transfer to floor today. 2. Hyperglycemia likely steroid-induced in setting of type 2 diabetes-improved.  Maintain on oral metformin and carb modified diet.  Sliding scale insulin to high dose with every 4 hours checks.  Levemir 10 units subcu daily starting now. A1c 7.9%.  Maintain on time-limited, gentle IV normal saline. 3. Hyperkalemia.  Administer Kayexalate. Repeat am labs. 4. Anxiety.  Acutely worsen secondary to above process.  Will maintain on IV Ativan as needed for anxiety control. 5. GERD.  Maintain on PPI. 6. Lumbago with neuropathy.  Maintain on gabapentin and oral narcotics. 7. Tobacco  abuse.  Discussed cessation.  Maintain on nicotine patch.   DVT prophylaxis:Lovenox Code Status: Full Family Communication: None Disposition Plan: Continue ongoing treatment with steroids and breathing treatments.  Work on weaning oxygen.   Consultants:   None  Procedures:   None  Antimicrobials:   Levaquin 4/14->   Subjective: Patient seen and evaluated today with no new acute complaints or concerns. No acute concerns or events noted overnight.  He thinks that he is starting to feel some better.  Objective: Vitals:   06/23/17 0413 06/23/17 0459 06/23/17 0500 06/23/17 0739  BP:   (!) 123/94   Pulse:   94 100  Resp:   (!) 8 18  Temp:    (!) 97.5 F (36.4 C)  TempSrc:    Oral  SpO2: 97%  95% 93%  Weight:  83.1 kg (183 lb 3.2 oz)    Height:        Intake/Output Summary (Last 24 hours) at 06/23/2017 0955 Last data filed at 06/23/2017 0740 Gross per 24 hour  Intake 2692.21 ml  Output 850 ml  Net 1842.21 ml   Filed Weights   06/22/17 1115 06/22/17 2018 06/23/17 0459  Weight: 80.7 kg (178 lb) 83.3 kg (183 lb 10.3 oz) 83.1 kg (183 lb 3.2 oz)    Examination:  General exam: Appears calm and comfortable  Respiratory system: Clear to auscultation with minimal wheezing. Respiratory effort normal. Currently on 3L [place]. Cardiovascular system: S1 & S2 heard, RRR. No JVD, murmurs, rubs, gallops or clicks. No pedal edema. Gastrointestinal system: Abdomen is nondistended, soft and nontender. No organomegaly or masses felt. Normal bowel  sounds heard. Central nervous system: Alert and oriented. No focal neurological deficits. Extremities: Symmetric 5 x 5 power. Skin: No rashes, lesions or ulcers Psychiatry: Judgement and insight appear normal. Mood & affect appropriate.     Data Reviewed: I have personally reviewed following labs and imaging studies  CBC: Recent Labs  Lab 06/22/17 1133 06/23/17 0453  WBC 10.9* 16.7*  NEUTROABS 9.1*  --   HGB 14.7 13.1  HCT 44.5 41.4    MCV 89.9 92.4  PLT 292 616   Basic Metabolic Panel: Recent Labs  Lab 06/22/17 1133 06/23/17 0453  NA 130* 133*  K 4.6 5.7*  CL 96* 98*  CO2 20* 25  GLUCOSE 502* 201*  BUN 10 15  CREATININE 1.10 1.29*  CALCIUM 8.6* 8.3*   GFR: Estimated Creatinine Clearance: 67.2 mL/min (A) (by C-G formula based on SCr of 1.29 mg/dL (H)). Liver Function Tests: No results for input(s): AST, ALT, ALKPHOS, BILITOT, PROT, ALBUMIN in the last 168 hours. No results for input(s): LIPASE, AMYLASE in the last 168 hours. No results for input(s): AMMONIA in the last 168 hours. Coagulation Profile: No results for input(s): INR, PROTIME in the last 168 hours. Cardiac Enzymes: Recent Labs  Lab 06/22/17 1133  TROPONINI <0.03   BNP (last 3 results) No results for input(s): PROBNP in the last 8760 hours. HbA1C: Recent Labs    06/22/17 1134  HGBA1C 7.9*   CBG: Recent Labs  Lab 06/22/17 1331 06/22/17 1454 06/22/17 1849 06/22/17 2119 06/23/17 0741  GLUCAP 511* 319* 343* 256* 241*   Lipid Profile: No results for input(s): CHOL, HDL, LDLCALC, TRIG, CHOLHDL, LDLDIRECT in the last 72 hours. Thyroid Function Tests: No results for input(s): TSH, T4TOTAL, FREET4, T3FREE, THYROIDAB in the last 72 hours. Anemia Panel: No results for input(s): VITAMINB12, FOLATE, FERRITIN, TIBC, IRON, RETICCTPCT in the last 72 hours. Sepsis Labs: No results for input(s): PROCALCITON, LATICACIDVEN in the last 168 hours.  Recent Results (from the past 240 hour(s))  MRSA PCR Screening     Status: None   Collection Time: 06/22/17  8:15 PM  Result Value Ref Range Status   MRSA by PCR NEGATIVE NEGATIVE Final    Comment:        The GeneXpert MRSA Assay (FDA approved for NASAL specimens only), is one component of a comprehensive MRSA colonization surveillance program. It is not intended to diagnose MRSA infection nor to guide or monitor treatment for MRSA infections. Performed at [facility], [address]  [place], [place] [address]        Radiology Studies: Dg Chest [facility] 1 View  Result Date: 06/22/2017 CLINICAL DATA:  Shortness of breath, smoker, trouble breathing for 2 months, history COPD, GERD EXAM: PORTABLE CHEST 1 VIEW COMPARISON:  Portable exam 1234 hours compared to 06/21/2017 FINDINGS: Normal heart size, mediastinal contours, and pulmonary vascularity. Lungs clear. No acute infiltrate, pleural effusion or pneumothorax. Prior cervical spine fusion. No acute osseous findings. IMPRESSION: No acute abnormalities. Electronically Signed   By: Lavonia Dana M.D.   On: 06/22/2017 12:54       Scheduled Meds: . allopurinol  100 mg Oral Daily  . aspirin  81 mg Oral Daily  . budesonide (PULMICORT) nebulizer solution  0.25 mg Nebulization BID  . diazepam  5 mg Oral BID  . diclofenac  50 mg Oral BID  . enoxaparin (LOVENOX) injection  40 mg Subcutaneous Q24H  . [START ON 06/25/2017] fentaNYL  50 mcg Transdermal Q72H  . folic acid  1 mg  Oral Daily  . gabapentin  300 mg Oral TID  . guaiFENesin  600 mg Oral BID  . insulin aspart  0-20 Units Subcutaneous TID WC  . insulin aspart  0-5 Units Subcutaneous QHS  . insulin aspart  6 Units Subcutaneous TID WC  . insulin detemir  10 Units Subcutaneous Daily  . ipratropium  0.5 mg Nebulization Q4H  . levalbuterol  1.25 mg Nebulization Q4H  . metFORMIN  1,000 mg Oral BID WC  . methylPREDNISolone (SOLU-MEDROL) injection  60 mg Intravenous Q6H  . nicotine  14 mg Transdermal Daily  . pantoprazole  40 mg Oral Daily  . tamsulosin  0.4 mg Oral QPC supper   Continuous Infusions: . levofloxacin (LEVAQUIN) IV Stopped (06/22/17 1651)     LOS: 1 day    Time spent: 30 minutes    Mckell Riecke Darleen Crocker, DO [facility] Pager [phone]  If 7PM-7AM, please contact night-coverage [email or web address] Password [facility] 06/23/2017, 9:55 AM

## 2017-06-24 LAB — GLUCOSE, CAPILLARY
Glucose-Capillary: 332 mg/dL — ABNORMAL HIGH (ref 65–99)
Glucose-Capillary: 54 mg/dL — ABNORMAL LOW (ref 65–99)
Glucose-Capillary: 52 mg/dL — ABNORMAL LOW (ref 65–99)
Glucose-Capillary: 54 mg/dL — ABNORMAL LOW (ref 65–99)
Glucose-Capillary: 74 mg/dL (ref 65–99)

## 2017-06-24 LAB — CBC
HCT: 43.8 % (ref 39.0–52.0)
Hemoglobin: 13.9 g/dL (ref 13.0–17.0)
MCH: 29.3 pg (ref 26.0–34.0)
MCHC: 31.7 g/dL (ref 30.0–36.0)
MCV: 92.2 fL (ref 78.0–100.0)
Platelets: 300 10*3/uL (ref 150–400)
RBC: 4.75 MIL/uL (ref 4.22–5.81)
RDW: 14.2 % (ref 11.5–15.5)
WBC: 24.1 10*3/uL — ABNORMAL HIGH (ref 4.0–10.5)

## 2017-06-24 LAB — BASIC METABOLIC PANEL
Anion gap: 14 (ref 5–15)
BUN: 17 mg/dL (ref 6–20)
CO2: 22 mmol/L (ref 22–32)
Calcium: 8.3 mg/dL — ABNORMAL LOW (ref 8.9–10.3)
Chloride: 93 mmol/L — ABNORMAL LOW (ref 101–111)
Creatinine, Ser: 0.94 mg/dL (ref 0.61–1.24)
GFR calc Af Amer: >60 mL/min (ref >60)
GFR calc non Af Amer: >60 mL/min (ref >60)
Glucose, Bld: 210 mg/dL — ABNORMAL HIGH (ref 65–99)
Potassium: 4.5 mmol/L (ref 3.5–5.1)
Sodium: 129 mmol/L — ABNORMAL LOW (ref 135–145)

## 2017-06-24 LAB — HIV ANTIBODY (ROUTINE TESTING W REFLEX): HIV Screen 4th Generation wRfx: NONREACTIVE

## 2017-06-24 MED ORDER — GUAIFENESIN ER 600 MG PO TB12: 600.0000 mg | ORAL_TABLET | Freq: Two times a day (BID) | ORAL | 0 refills | Status: AC

## 2017-06-24 MED ORDER — PREDNISONE 20 MG PO TABS: 40.0000 mg | ORAL_TABLET | Freq: Every day | ORAL | 0 refills | Status: AC

## 2017-06-24 MED ORDER — LEVOFLOXACIN 500 MG PO TABS: 500.0000 mg | ORAL_TABLET | Freq: Every day | ORAL | 0 refills | Status: AC

## 2017-06-24 MED ORDER — NICOTINE 14 MG/24HR TD PT24: 14.0000 mg | MEDICATED_PATCH | Freq: Every day | TRANSDERMAL | 0 refills | Status: DC

## 2017-06-24 MED ORDER — GLUCOSE 40 % PO GEL
1.0000 | Freq: Once | ORAL | Status: AC
  Administered 2017-06-24: 37.5 g via ORAL

## 2017-06-24 MED ORDER — GLUCOSE 40 % PO GEL
ORAL | Status: AC
  Filled 2017-06-24: qty 1

## 2017-06-24 MED ORDER — GLIPIZIDE 5 MG PO TABS: 2.5000 mg | ORAL_TABLET | Freq: Two times a day (BID) | ORAL | 0 refills | Status: DC

## 2017-06-24 MED ORDER — DIAZEPAM 5 MG PO TABS: 5.0000 mg | ORAL_TABLET | Freq: Two times a day (BID) | ORAL | 0 refills | Status: DC

## 2017-06-24 NOTE — Care Management (Signed)
Patient Information   Patient Name Raymond [name], Raymond [name] ([id number]) Sex Male DOB November 21, 1960  Room Bed  A331 A331-01  Patient Demographics   Address [place] [address] Phone [phone] [facility]) [phone] (Mobile)  Patient Ethnicity & Race   Ethnic Group Patient Race  Not Hispanic or Latino White or Caucasian Other or two or more races  Emergency Contact(s)   Name Relation Home Work Mobile  Ellsworth Lennox  [phone]    Documents on File    Status Date Received Description  Documents for the Patient  Driver's License Not Received    [facility] Release of Information Not Received    [facility] Release of Information Not Received    Release of Information Not Received    Historic Radiology Documentation Not Received    Historic Radiology Documentation Not Received    Historic Radiology Documentation Not Received    Historic Radiology Documentation Not Received    Release of Information Not Received    [place] Not Received    [place] E-Signature HIPAA Notice of Privacy Received 09/28/12   [place] E-Signature HIPAA Notice of Privacy [place] Not Received    Insurance Card Received 07/04/10   Advance Directives/Living Will/HCPOA/POA Not Received    Insurance Card Not Received    [place] HIPAA NOTICE OF PRIVACY - Scanned Not Received    Release of Information Not Received    Insurance Card Not Received    Insurance Card Not Received    Insurance Card Received 10/15/12   Insurance Card Not Received    HIM ROI Authorization Not Received    Insurance Card Not Received  MEDICAID  Insurance Card Received 11/11/12   Insurance Card Not Received    [facility]IM ROI Authorization Not Received    [place] E-Signature HIPAA Notice of Privacy Received 02/24/13   Insurance Card Received 02/24/13 Medicare/Medicaid  Insurance Card Not Received    AMB Intake Forms/Questionnaires  07/02/13   AMB Correspondence  07/02/13 12/14 PROGRESS NOTES  [facility] [place] Card     Advanced Beneficiary Notice (ABN) Not Received    E-Signature AOB [place] Not Received    Insurance Card Received 05/16/14 Medicare/DJ/GNA  Other Photo ID Not Received    Documents for the Encounter  AOB (Assignment of Insurance Benefits) Not Received    E-signature AOB Signed 06/22/17   MEDICARE RIGHTS Not Received    E-signature Medicare Rights Signed 06/22/17   Cardiac Monitoring Strip Received 06/22/17   Cardiac Monitoring Strip Shift Summary Received 06/22/17   After Visit Summary   IP After Visit Summary  EKG Received 06/23/17   Admission Information   Attending Provider Admitting Provider Admission Type Admission Date/Time  Rodena Goldmann, DO Rodena Goldmann, DO Emergency 06/22/17 1121  Discharge Date Hospital Service Auth/Cert Status Service Area   Internal Medicine Incomplete [facility]  Unit Room/Bed Admission Status   AP-DEPT 300 A331/A331-01 Admission (Confirmed)   Admission   Complaint  difficulty breathing  Hospital Account   Name Acct ID Class Status Primary Coverage  Raymond [name], Raymond [name] [id number] Inpatient Open MEDICARE - MEDICARE PART A AND B      Guarantor Account (for [place] [id number])   Name Relation to Pt Service Area Active? Acct Type  Raymond [name] Self CHSA Yes Personal/Family  Address Phone    [address] [place], [place] [address] [phone])        Coverage Information (for  Hospital Account [id number])   F/O Payor/Plan Precert #  MEDICARE/MEDICARE PART A AND B   Subscriber Subscriber #  Raymond [name], Raymond [name] [id number]  Address Phone  [address] [place], [place] [address]

## 2017-06-24 NOTE — Progress Notes (Signed)
SATURATION QUALIFICATIONS: (This note is used to comply with regulatory documentation for home oxygen)  Patient Saturations on Room Air at Rest = 96%  Patient Saturations on Room Air while Ambulating = 85%  Patient Saturations on 3 Liters of oxygen while Ambulating = 97%   Please briefly explain why patient needs home oxygen: Desats during ambulation/activity on room air.

## 2017-06-24 NOTE — Discharge Instructions (Signed)
Chronic Obstructive Pulmonary Disease Exacerbation Chronic obstructive pulmonary disease (COPD) is a long-term (chronic) condition that affects the lungs. COPD is a general term that can be used to describe many different lung problems that cause lung swelling (inflammation) and limit airflow, including chronic bronchitis and emphysema. COPD exacerbations are episodes when breathing symptoms become much worse and require extra treatment. COPD exacerbations are usually caused by infections. Without treatment, COPD exacerbations can be severe and even life threatening. Frequent COPD exacerbations can cause further damage to the lungs. What are the causes? This condition may be caused by:  Respiratory infections, including viral and bacterial infections.  Exposure to smoke.  Exposure to air pollution, chemical fumes, or dust.  Things that give you an allergic reaction (allergens).  Not taking your usual COPD medicines as directed.  Underlying medical problems, such as congestive heart failure or infections not involving the lungs.  In many cases, the cause (trigger) of this condition is not known. What increases the risk? The following factors may make you more likely to develop this condition:  Smoking cigarettes.  Old age.  Frequent prior COPD exacerbations.  What are the signs or symptoms? Symptoms of this condition include:  Increased coughing.  Increased production of mucus from your lungs (sputum).  Increased wheezing.  Increased shortness of breath.  Rapid or labored breathing.  Chest tightness.  Less energy than usual.  Sleep disruption from symptoms.  Confusion or increased sleepiness.  Often these symptoms happen or get worse even with the use of medicines. How is this diagnosed? This condition is diagnosed based on:  Your medical history.  A physical exam.  You may also have tests, including:  A chest X-ray.  Blood tests.  Lung (pulmonary)  function tests.  How is this treated? Treatment for this condition depends on the severity and cause of the symptoms. You may need to be admitted to a hospital for treatment. Some of the treatments commonly used to treat COPD exacerbations are:  Antibiotic medicines. These may be used for severe exacerbations caused by a lung infection, such as pneumonia.  Bronchodilators. These are inhaled medicines that expand the air passages and allow increased airflow.  Steroid medicines. These act to reduce inflammation in the airways. They may be given with an inhaler, taken by mouth, or given through an IV tube inserted into one of your veins.  Supplemental oxygen therapy.  Airway clearing techniques, such as noninvasive ventilation (NIV) and positive expiratory pressure (PEP). These provide respiratory support through a mask or other noninvasive device. An example of this would be using a continuous positive airway pressure (CPAP) machine to improve delivery of oxygen into your lungs.  Follow these instructions at home: Medicines  Take over-the-counter and prescription medicines only as told by your health care provider. It is important to use correct technique with inhaled medicines.  If you were prescribed an antibiotic medicine or oral steroid, take it as told by your health care provider. Do not stop taking the medicine even if you start to feel better. Lifestyle  Eat a healthy diet.  Exercise regularly.  Get plenty of sleep.  Avoid exposure to all substances that irritate the airway, especially to tobacco smoke.  Wash your hands often with soap and water to reduce the risk of infection. If soap and water are not available, use hand sanitizer.  During flu season, avoid enclosed spaces that are crowded with people. General instructions  Drink enough fluid to keep your urine clear or pale yellow (  unless you have a medical condition that requires fluid restriction).  Use a cool mist  vaporizer. This humidifies the air and makes it easier for you to clear your chest when you cough.  If you have a home nebulizer and oxygen, continue to use them as told by your health care provider.  Keep all follow-up visits as told by your health care provider. This is important. How is this prevented?  Stay up-to-date on pneumococcal and influenza (flu) vaccines. A flu shot is recommended every year to help prevent exacerbations.  Do not use any products that contain nicotine or tobacco, such as cigarettes and e-cigarettes. Quitting smoking is very important in preventing COPD from getting worse and in preventing exacerbations from happening as often. If you need help quitting, ask your health care provider.  Follow all instructions for pulmonary rehabilitation after a recent exacerbation. This can help prevent future exacerbations.  Work with your health care provider to develop and follow an action plan. This tells you what steps to take when you experience certain symptoms. Contact a health care provider if:  You have a worsening of your regular COPD symptoms. Get help right away if:  You have worsening shortness of breath, even when resting.  You have trouble talking.  You have severe chest pain.  You cough up blood.  You have a fever.  You have weakness, vomit repeatedly, or faint.  You feel confused.  You are not able to sleep because of your symptoms.  You have trouble doing daily activities. Summary  COPD exacerbations are episodes when breathing symptoms become much worse and require extra treatment above your normal treatment.  Exacerbations can be severe and even life threatening. Frequent COPD exacerbations can cause further damage to your lungs.  COPD exacerbations are usually triggered by infections such as the flu, colds, and even pneumonia.  Treatment for this condition depends on the severity and cause of the symptoms. You may need to be admitted to a  hospital for treatment.  Quitting smoking is very important to prevent COPD from getting worse and to prevent exacerbations from happening as often. This information is not intended to replace advice given to you by your health care provider. Make sure you discuss any questions you have with your health care provider. Document Released: 12/23/2006 Document Revised: 04/01/2016 Document Reviewed: 04/01/2016 Elsevier Interactive Patient Education  2018 Elsevier Inc.  

## 2017-06-24 NOTE — Progress Notes (Signed)
Patients home O2 delivered for discharge. Representative states they will deliver O2 concentrator to his home once he gets there. Patient reported increased shortness of breath after ambulating to bathroom and back. O2 sats 99-100% on 4 lpm. Patient states "I feel better after resting and want to go home." Notified Dr. Manuella Ghazi. Stated still okay for discharge home with home O2. Donavan Foil, RN

## 2017-06-24 NOTE — Progress Notes (Addendum)
Hypoglycemic Event  CBG: 54 at 1112  Treatment: 15 GM carbohydrate snack  Symptoms: Shaky  Follow-up CBG: Time: 1132 CBG Result: 54  Possible Reasons for Event: Unknown  Comments/MD notified: Follow-up CBG of 54 treated with one tube of glucose gel per hypoglycemia protocol. Will reassess CBG and notify MD.    Follow-up CBG 74 after glucose gel and eating lunch. Text-paged Dr. Manuella Ghazi to notify.   Cindie Crumbly

## 2017-06-24 NOTE — Progress Notes (Addendum)
Inpatient Diabetes Program Recommendations  AACE/ADA: New Consensus Statement on Inpatient Glycemic Control (2015)  Target Ranges:  Prepandial:   less than 140 mg/dL      Peak postprandial:   less than 180 mg/dL (1-2 hours)      Critically ill patients:  140 - 180 mg/dL  Results for QUAYSHAUN, HUBBERT (MRN [id number]) as of 06/24/2017 09:08  Ref. Range 06/23/2017 07:41 06/23/2017 11:12 06/23/2017 16:18 06/23/2017 21:15 06/24/2017 07:42  Glucose-Capillary Latest Ref Range: 65 - 99 mg/dL 241 (H) 288 (H) 137 (H) 134 (H) 332 (H)   Results for HOSEY, BURMESTER (MRN [id number]) as of 06/24/2017 09:08  Ref. Range 06/22/2017 11:34  Hemoglobin A1C Latest Ref Range: 4.8 - 5.6 % 7.9 (H)   Review of Glycemic Control  Diabetes history: DM2 Outpatient Diabetes medications: Metformin 1000 mg BID Current orders for Inpatient glycemic control: Novolog 0-20 units TID with meals, Novolog 0-5 units QHS, Novolog 6 units TID with meals, Levemir 10 units daily, Metformin 1000 mg BID  Inpatient Diabetes Program Recommendations:  Steroids: Noted no longer ordered steroids and fasting 332 mg/dl today. Anticipate glycemic trends to improve since no longer ordered steroids. HgbA1C: A1C 7.9% on 06/22/17 indicating an average glucose of 180 mg/dl over the past 2-3 months.  Thanks, Barnie Alderman, RN, MSN, CDE Diabetes Coordinator Inpatient Diabetes Program [phone] (Team Pager from 8am to 5pm)

## 2017-06-24 NOTE — Progress Notes (Signed)
Discharge instructions reviewed with patient, given AVS with next doses of home medication noted. Prescriptions sent to his pharmacy by MD. Patient aware to pick them up. Verbalized understanding of instructions. Discussed when to seek medical attention and f/u with PCP. States he will call tomorrow for a f/u appointment. Home O2 tank delivered for discharge, pt demonstrates correct use. Discussed O2 safety, not to smoke with O2 or allow family/visitors to smoke in or near his home. Reviewed hypoglycemia symptoms and treatment and to be sure and check blood sugar if any symptoms noted. Instructed to hold blood sugar medication if any hypoglycemia occurs per MD instruction. Verbalized understanding. IV site d/c'd, site within normal limits. Pt in stable condition awaiting wheelchair and nursing staff member to accompany him off unit. Donavan Foil, RN

## 2017-06-24 NOTE — Discharge Summary (Signed)
Physician Discharge Summary  Raymond [name] WHQ:[id number] DOB: 04/21/1960 DOA: 56/14/2019  PCP: [name], MD  Admit date: 06/22/2017  Discharge date: 06/24/2017  Admitted From:Home  Disposition:  Home  Recommendations for Outpatient Follow-up:  1. Follow up with PCP in 1-2 weeks  Home Health:None  Equipment/Devices: Home oxygen 3L [place]  Discharge Condition:Stable  CODE STATUS: Full  Diet recommendation: Heart Healthy/Carb modified  Brief/Interim Summary:  Erwin Q Jonesis a 57 y.o.malewith medical history significant foranxiety, GERD, prior CVA, type 2 diabetes, hypertension, and COPD who presented to the emergency department with gradual worsening of cough, wheezing, and dyspnea over the last 1 month that had worsened over the past several days. He had just left [facility] him earlier this morning after being treated for COPD exacerbation overnight and was noted to have hyperglycemia and was discharged with oral steroid taper as well as Levaquin.  He has been admitted with a recurrence of his COPD exacerbation with acute hypoxemic respiratory failure.  He is feeling much better on this day of discharge, but does require home oxygen with 3 L nasal cannula which has been set up for him.  He will remain on oral prednisone for 7 days as well as Levaquin for 3 more days.  He has home nebulizer to use as needed.  He has had trouble with steroid-induced hyperglycemia for which I will add additional glipizide coverage for 7 more days in addition to his home metformin.  Discharge Diagnoses:  Principal Problem:   COPD with acute exacerbation ([place]) Active Problems:   GERD (gastroesophageal reflux disease)   Hyperglycemia   Anxiety   Type 2 diabetes mellitus without complication (HCC)   COPD exacerbation ([place])   1. Acute hypoxemic respiratory failure secondary topersistentCOPD exacerbation. Improved.  Discharged with 3 more days of Levaquin as well as steroid taper.  Discharging  with home oxygen 3 L nasal cannula to wear continuously. 2. Hyperglycemia likely steroid-induced in setting of type 2 diabetes-improved. Maintain on home metformin and add glipizide twice daily for blood glucose control while on steroid taper. 3. Hyperkalemia.    Resolved. 4. Mild hyponatremia.  Secondary to hyperglycemia which should improve.  Monitor in outpatient setting.  Patient is asymptomatic. 5. Anxiety. Continue on home Valium which has been represcribed for 30 tablets. 6. GERD. Maintain on PPI. 7. Lumbago with neuropathy. Maintain on gabapentin and oral narcotics. 8. Tobacco abuse. Discussed cessation. Maintain on nicotine patch.  Discharge Instructions  Discharge Instructions    Diet - low sodium heart healthy   Complete by:  As directed    Increase activity slowly   Complete by:  As directed      Allergies as of 06/24/2017      Reactions   Penicillins Anaphylaxis   Latex    Morphine And Related    Vicodin [hydrocodone-acetaminophen]       Medication List    TAKE these medications   albuterol 2 MG tablet Commonly known as:  PROVENTIL Take 2 mg by mouth 3 (three) times daily.   albuterol (2.5 MG/3ML) 0.083% nebulizer solution Commonly known as:  PROVENTIL   albuterol 108 (90 Base) MCG/ACT inhaler Commonly known as:  PROVENTIL HFA;VENTOLIN HFA Inhale 2 puffs into the lungs every 6 (six) hours as needed for wheezing.   allopurinol 100 MG tablet Commonly known as:  ZYLOPRIM Take 100 mg by mouth daily.   aspirin 81 MG tablet Take 81 mg by mouth daily.   cyclobenzaprine 10 MG tablet Commonly known as:  FLEXERIL Take 10 mg by mouth 3 (three) times daily as needed.   diazepam 5 MG tablet Commonly known as:  VALIUM Take 1 tablet (5 mg total) by mouth 2 (two) times daily.   Diclofenac Sodium CR 100 MG 24 hr tablet Take 100 mg by mouth daily.   fentaNYL 50 MCG/HR Commonly known as:  DURAGESIC - dosed mcg/hr Place 1 patch onto the skin every 3 (three)  days.   folic acid 1 MG tablet Commonly known as:  FOLVITE Take 1 mg by mouth daily.   gabapentin 300 MG capsule Commonly known as:  NEURONTIN Take 300 mg by mouth 3 (three) times daily.   glipiZIDE 5 MG tablet Commonly known as:  GLUCOTROL Take 0.5 tablets (2.5 mg total) by mouth 2 (two) times daily for 7 days.   guaiFENesin 600 MG 12 hr tablet Commonly known as:  MUCINEX Take 1 tablet (600 mg total) by mouth 2 (two) times daily for 10 days.   levofloxacin 500 MG tablet Commonly known as:  LEVAQUIN Take 1 tablet (500 mg total) by mouth daily for 3 days.   metFORMIN 500 MG tablet Commonly known as:  GLUCOPHAGE Take 2 tablets by mouth 2 (two) times daily.   nicotine 14 mg/24hr patch Commonly known as:  NICODERM CQ - dosed in mg/24 hours Place 1 patch (14 mg total) onto the skin daily. Start taking on:  06/25/2017   omeprazole 20 MG capsule Commonly known as:  PRILOSEC TAKE (1) CAPSULE BY MOUTH ONCE DAILY.   oxyCODONE-acetaminophen 10-325 MG tablet Commonly known as:  PERCOCET Take 1-2 tablets by mouth every 6 (six) hours as needed for pain.   predniSONE 20 MG tablet Commonly known as:  DELTASONE Take 2 tablets (40 mg total) by mouth daily with breakfast for 7 days.   SPIRIVA HANDIHALER 18 MCG inhalation capsule Generic drug:  tiotropium Place 18 mcg into inhaler and inhale daily.   tamsulosin 0.4 MG Caps capsule Commonly known as:  FLOMAX Take 0.4 mg by mouth daily after supper.            Durable Medical Equipment  (From admission, onward)        Start     Ordered   06/24/17 1100  DME Oxygen  Once    Comments:  Portable oxygen concentrator.  Question Answer Comment  Mode or (Route) Nasal cannula   Frequency Continuous (stationary and portable oxygen unit needed)   Oxygen conserving device Yes   Oxygen delivery system Gas      06/24/17 1102     Follow-up Information    [name], MD Follow up in 56 week(s).   Specialty:  Internal  Medicine Contact information: [address] [place] [address] [phone]          Allergies  Allergen Reactions  . Penicillins Anaphylaxis  . Latex   . Morphine And Related   . Vicodin [Hydrocodone-Acetaminophen]     Consultations:  None   Procedures/Studies: Dg Chest Port 1 View  Result Date: 06/22/2017 CLINICAL DATA:  Shortness of breath, smoker, trouble breathing for 2 months, history COPD, GERD EXAM: PORTABLE CHEST 1 VIEW COMPARISON:  Portable exam 1234 hours compared to 06/21/2017 FINDINGS: Normal heart size, mediastinal contours, and pulmonary vascularity. Lungs clear. No acute infiltrate, pleural effusion or pneumothorax. Prior cervical spine fusion. No acute osseous findings. IMPRESSION: No acute abnormalities. Electronically Signed   By: Lavonia Dana M.D.   On: 06/22/2017 12:54   Discharge Exam: Vitals:   06/24/17  1610 06/24/17 1028  BP:    Pulse:  (!) 130  Resp:    Temp:    SpO2: 100% (!) 85%   Vitals:   06/24/17 0453 06/24/17 0815 06/24/17 0817 06/24/17 1028  BP: (!) 137/96     Pulse: (!) 106   (!) 130  Resp:      Temp:      TempSrc:      SpO2: 96% 98% 100% (!) 85%  Weight:      Height:        General: Pt is alert, awake, not in acute distress Cardiovascular: RRR, S1/S2 +, no rubs, no gallops Respiratory: CTA bilaterally, no wheezing, no rhonchi, on 3 L nasal cannula Abdominal: Soft, NT, ND, bowel sounds + Extremities: no edema, no cyanosis    The results of significant diagnostics from this hospitalization (including imaging, microbiology, ancillary and laboratory) are listed below for reference.     Microbiology: Recent Results (from the past 240 hour(s))  MRSA PCR Screening     Status: None   Collection Time: 06/22/17  8:15 PM  Result Value Ref Range Status   MRSA by PCR NEGATIVE NEGATIVE Final    Comment:        The GeneXpert MRSA Assay (FDA approved for NASAL specimens only), is one component of a comprehensive MRSA  colonization surveillance program. It is not intended to diagnose MRSA infection nor to guide or monitor treatment for MRSA infections. Performed at [facility], [address], [place], [place] [address]      Labs: BNP (last 3 results) Recent Labs    06/22/17 1134  BNP 409.8*   Basic Metabolic Panel: Recent Labs  Lab 06/22/17 1133 06/23/17 0453 06/24/17 0515  NA 130* 133* 129*  K 4.6 5.7* 4.5  CL 96* 98* 93*  CO2 20* 25 22  GLUCOSE 502* 201* 210*  BUN 10 15 17   CREATININE 1.10 1.29* 0.94  CALCIUM 8.6* 8.3* 8.3*   Liver Function Tests: No results for input(s): AST, ALT, ALKPHOS, BILITOT, PROT, ALBUMIN in the last 168 hours. No results for input(s): LIPASE, AMYLASE in the last 168 hours. No results for input(s): AMMONIA in the last 168 hours. CBC: Recent Labs  Lab 06/22/17 1133 06/23/17 0453 06/24/17 0515  WBC 10.9* 16.7* 24.1*  NEUTROABS 9.1*  --   --   HGB 14.7 13.1 13.9  HCT 44.5 41.4 43.8  MCV 89.9 92.4 92.2  PLT 292 290 300   Cardiac Enzymes: Recent Labs  Lab 06/22/17 1133  TROPONINI <0.03   BNP: Invalid input(s): POCBNP CBG: Recent Labs  Lab 06/23/17 0741 06/23/17 1112 06/23/17 1618 06/23/17 2115 06/24/17 0742  GLUCAP 241* 288* 137* 134* 332*   D-Dimer No results for input(s): DDIMER in the last 72 hours. Hgb A1c Recent Labs    06/22/17 1134  HGBA1C 7.9*   Lipid Profile No results for input(s): CHOL, HDL, LDLCALC, TRIG, CHOLHDL, LDLDIRECT in the last 72 hours. Thyroid function studies No results for input(s): TSH, T4TOTAL, T3FREE, THYROIDAB in the last 72 hours.  Invalid input(s): FREET3 Anemia work up No results for input(s): VITAMINB12, FOLATE, FERRITIN, TIBC, IRON, RETICCTPCT in the last 72 hours. Urinalysis    Component Value Date/Time   COLORURINE YELLOW 11/11/2009 1455   APPEARANCEUR CLEAR 11/11/2009 1455   LABSPEC <1.005 (L) 11/11/2009 1455   PHURINE 6.5 11/11/2009 1455   GLUCOSEU NEGATIVE 11/11/2009 1455   HGBUR  NEGATIVE 11/11/2009 1455   BILIRUBINUR NEGATIVE 11/11/2009 1455   KETONESUR NEGATIVE 11/11/2009  [place] 11/11/2009 1455   UROBILINOGEN 0.2 11/11/2009 1455   NITRITE NEGATIVE 11/11/2009 1455   LEUKOCYTESUR  11/11/2009 1455    NEGATIVE MICROSCOPIC NOT DONE ON URINES WITH NEGATIVE PROTEIN, BLOOD, LEUKOCYTES, NITRITE, OR GLUCOSE <1000 mg/dL.   Sepsis Labs Invalid input(s): PROCALCITONIN,  WBC,  LACTICIDVEN Microbiology Recent Results (from the past 240 hour(s))  MRSA PCR Screening     Status: None   Collection Time: 06/22/17  8:15 PM  Result Value Ref Range Status   MRSA by PCR NEGATIVE NEGATIVE Final    Comment:        The GeneXpert MRSA Assay (FDA approved for NASAL specimens only), is one component of a comprehensive MRSA colonization surveillance program. It is not intended to diagnose MRSA infection nor to guide or monitor treatment for MRSA infections. Performed at [facility], [address] [place], [place] [address]      Time coordinating discharge: 35 minutes  SIGNED:   Rodena Goldmann, DO [facility] 06/24/2017, 11:03 AM Pager [phone]  If 7PM-7AM, please contact night-coverage [email or web address] Password [email or web address]

## 2017-06-24 NOTE — Progress Notes (Signed)
Notified Dr. Manuella Ghazi of patient having hypoglycemic event this am prior to lunch. Notified patient had snack, glucose gel and lunch tray. Follow-up CBG 74. Pt was shaky prior to treatment, states okay now. MD instructed nursing staff to remind patient to check blood sugar if any signs of hypoglycemia and hold blood sugar medication if blood sugar is low. Nursing will provide education with discharge instructions. MD aware we are waiting on home O2 to be delivered for discharge home and patient states he will have a friend coming to stay with him for few days until feeling better. Donavan Foil, RN

## 2017-06-24 NOTE — Care Management Note (Signed)
Case Management Note  Patient Details  Name: Raymond [name] MRN: [id number] Date of Birth: 1960-09-30  Subjective/Objective:    Admitted with COPD. From home lives alone, ind pta. Drives, has PCP, eats out 3 meals a day. Does not have home O2 pta. Does have a neb machine.                 Action/Plan: DC home today with home oxygen. He has requested DME come from [place] as it is close to his home. CM has faxed referral. Requesting port concentrator as pt is not home bound. He will have e-tank delivered today and will have port concentrator delivered to home in next 2-3 days per [employer].   Expected Discharge Date:  06/24/17               Expected Discharge Plan:  Home/Self Care  In-House Referral:  NA  Discharge planning Services  CM Consult  Post Acute Care Choice:  Durable Medical Equipment Choice offered to:  Patient  DME Arranged:  Oxygen DME Agency:  [facility]  Status of Service:  Completed, signed off  Sherald Barge, RN 06/24/2017, 11:27 AM

## 2017-07-09 DIAGNOSIS — J9610 Chronic respiratory failure, unspecified whether with hypoxia or hypercapnia: Secondary | ICD-10-CM | POA: Diagnosis not present

## 2017-07-09 DIAGNOSIS — J9620 Acute and chronic respiratory failure, unspecified whether with hypoxia or hypercapnia: Secondary | ICD-10-CM | POA: Diagnosis not present

## 2017-07-09 DIAGNOSIS — K219 Gastro-esophageal reflux disease without esophagitis: Secondary | ICD-10-CM | POA: Diagnosis not present

## 2017-07-09 DIAGNOSIS — R0902 Hypoxemia: Secondary | ICD-10-CM | POA: Diagnosis not present

## 2017-07-09 DIAGNOSIS — G64 Other disorders of peripheral nervous system: Secondary | ICD-10-CM | POA: Diagnosis not present

## 2017-07-09 DIAGNOSIS — Z6829 Body mass index (BMI) 29.0-29.9, adult: Secondary | ICD-10-CM | POA: Diagnosis not present

## 2017-07-09 DIAGNOSIS — J441 Chronic obstructive pulmonary disease with (acute) exacerbation: Secondary | ICD-10-CM | POA: Diagnosis not present

## 2017-09-16 DIAGNOSIS — M47816 Spondylosis without myelopathy or radiculopathy, lumbar region: Secondary | ICD-10-CM | POA: Diagnosis not present

## 2017-10-07 DIAGNOSIS — Z0001 Encounter for general adult medical examination with abnormal findings: Secondary | ICD-10-CM | POA: Diagnosis not present

## 2017-10-07 DIAGNOSIS — M1991 Primary osteoarthritis, unspecified site: Secondary | ICD-10-CM | POA: Diagnosis not present

## 2017-10-07 DIAGNOSIS — Z1389 Encounter for screening for other disorder: Secondary | ICD-10-CM | POA: Diagnosis not present

## 2017-12-17 DIAGNOSIS — M4646 Discitis, unspecified, lumbar region: Secondary | ICD-10-CM | POA: Diagnosis not present

## 2017-12-17 DIAGNOSIS — M4716 Other spondylosis with myelopathy, lumbar region: Secondary | ICD-10-CM | POA: Diagnosis not present

## 2018-03-31 DIAGNOSIS — M47816 Spondylosis without myelopathy or radiculopathy, lumbar region: Secondary | ICD-10-CM | POA: Diagnosis not present

## 2018-03-31 DIAGNOSIS — M4646 Discitis, unspecified, lumbar region: Secondary | ICD-10-CM | POA: Diagnosis not present

## 2018-03-31 DIAGNOSIS — M4716 Other spondylosis with myelopathy, lumbar region: Secondary | ICD-10-CM | POA: Diagnosis not present

## 2018-11-10 DIAGNOSIS — G894 Chronic pain syndrome: Secondary | ICD-10-CM | POA: Diagnosis not present

## 2018-11-10 DIAGNOSIS — K219 Gastro-esophageal reflux disease without esophagitis: Secondary | ICD-10-CM | POA: Diagnosis not present

## 2018-11-10 DIAGNOSIS — Z0001 Encounter for general adult medical examination with abnormal findings: Secondary | ICD-10-CM | POA: Diagnosis not present

## 2018-11-10 DIAGNOSIS — Z6825 Body mass index (BMI) 25.0-25.9, adult: Secondary | ICD-10-CM | POA: Diagnosis not present

## 2018-11-10 DIAGNOSIS — N400 Enlarged prostate without lower urinary tract symptoms: Secondary | ICD-10-CM | POA: Diagnosis not present

## 2018-11-10 DIAGNOSIS — Z23 Encounter for immunization: Secondary | ICD-10-CM | POA: Diagnosis not present

## 2018-12-11 DIAGNOSIS — G894 Chronic pain syndrome: Secondary | ICD-10-CM | POA: Diagnosis not present

## 2019-01-12 DIAGNOSIS — M1991 Primary osteoarthritis, unspecified site: Secondary | ICD-10-CM | POA: Diagnosis not present

## 2019-01-12 DIAGNOSIS — G894 Chronic pain syndrome: Secondary | ICD-10-CM | POA: Diagnosis not present

## 2019-01-12 DIAGNOSIS — M2550 Pain in unspecified joint: Secondary | ICD-10-CM | POA: Diagnosis not present

## 2019-02-16 DIAGNOSIS — G894 Chronic pain syndrome: Secondary | ICD-10-CM | POA: Diagnosis not present

## 2019-04-14 DIAGNOSIS — M5030 Other cervical disc degeneration, unspecified cervical region: Secondary | ICD-10-CM | POA: Diagnosis not present

## 2019-04-14 DIAGNOSIS — G894 Chronic pain syndrome: Secondary | ICD-10-CM | POA: Diagnosis not present

## 2019-04-14 DIAGNOSIS — M159 Polyosteoarthritis, unspecified: Secondary | ICD-10-CM | POA: Diagnosis not present

## 2019-04-14 DIAGNOSIS — M5136 Other intervertebral disc degeneration, lumbar region: Secondary | ICD-10-CM | POA: Diagnosis not present

## 2019-05-17 DIAGNOSIS — G894 Chronic pain syndrome: Secondary | ICD-10-CM | POA: Diagnosis not present

## 2019-06-10 DIAGNOSIS — K219 Gastro-esophageal reflux disease without esophagitis: Secondary | ICD-10-CM | POA: Diagnosis not present

## 2019-06-10 DIAGNOSIS — Z0001 Encounter for general adult medical examination with abnormal findings: Secondary | ICD-10-CM | POA: Diagnosis not present

## 2019-06-10 DIAGNOSIS — Z1389 Encounter for screening for other disorder: Secondary | ICD-10-CM | POA: Diagnosis not present

## 2019-06-10 DIAGNOSIS — I10 Essential (primary) hypertension: Secondary | ICD-10-CM | POA: Diagnosis not present

## 2019-06-10 DIAGNOSIS — G894 Chronic pain syndrome: Secondary | ICD-10-CM | POA: Diagnosis not present

## 2019-06-10 DIAGNOSIS — Z6826 Body mass index (BMI) 26.0-26.9, adult: Secondary | ICD-10-CM | POA: Diagnosis not present

## 2019-06-10 DIAGNOSIS — Z0000 Encounter for general adult medical examination without abnormal findings: Secondary | ICD-10-CM | POA: Diagnosis not present

## 2019-06-10 DIAGNOSIS — E782 Mixed hyperlipidemia: Secondary | ICD-10-CM | POA: Diagnosis not present

## 2019-06-10 DIAGNOSIS — E1165 Type 2 diabetes mellitus with hyperglycemia: Secondary | ICD-10-CM | POA: Diagnosis not present

## 2019-06-10 DIAGNOSIS — R7309 Other abnormal glucose: Secondary | ICD-10-CM | POA: Diagnosis not present

## 2019-06-10 DIAGNOSIS — M1991 Primary osteoarthritis, unspecified site: Secondary | ICD-10-CM | POA: Diagnosis not present

## 2019-06-10 DIAGNOSIS — E7849 Other hyperlipidemia: Secondary | ICD-10-CM | POA: Diagnosis not present

## 2019-07-15 DIAGNOSIS — G894 Chronic pain syndrome: Secondary | ICD-10-CM | POA: Diagnosis not present

## 2019-07-15 DIAGNOSIS — J449 Chronic obstructive pulmonary disease, unspecified: Secondary | ICD-10-CM | POA: Diagnosis not present

## 2019-07-15 DIAGNOSIS — M1991 Primary osteoarthritis, unspecified site: Secondary | ICD-10-CM | POA: Diagnosis not present

## 2019-08-09 DIAGNOSIS — I10 Essential (primary) hypertension: Secondary | ICD-10-CM | POA: Diagnosis not present

## 2019-08-09 DIAGNOSIS — J449 Chronic obstructive pulmonary disease, unspecified: Secondary | ICD-10-CM | POA: Diagnosis not present

## 2019-08-09 DIAGNOSIS — Z720 Tobacco use: Secondary | ICD-10-CM | POA: Diagnosis not present

## 2019-08-09 DIAGNOSIS — E1165 Type 2 diabetes mellitus with hyperglycemia: Secondary | ICD-10-CM | POA: Diagnosis not present

## 2019-08-10 DIAGNOSIS — M159 Polyosteoarthritis, unspecified: Secondary | ICD-10-CM | POA: Diagnosis not present

## 2019-08-10 DIAGNOSIS — G894 Chronic pain syndrome: Secondary | ICD-10-CM | POA: Diagnosis not present

## 2019-09-08 DIAGNOSIS — M159 Polyosteoarthritis, unspecified: Secondary | ICD-10-CM | POA: Diagnosis not present

## 2019-09-08 DIAGNOSIS — J449 Chronic obstructive pulmonary disease, unspecified: Secondary | ICD-10-CM | POA: Diagnosis not present

## 2019-09-08 DIAGNOSIS — G8929 Other chronic pain: Secondary | ICD-10-CM | POA: Diagnosis not present

## 2019-09-08 DIAGNOSIS — I10 Essential (primary) hypertension: Secondary | ICD-10-CM | POA: Diagnosis not present

## 2019-09-08 DIAGNOSIS — E1165 Type 2 diabetes mellitus with hyperglycemia: Secondary | ICD-10-CM | POA: Diagnosis not present

## 2019-09-08 DIAGNOSIS — Z720 Tobacco use: Secondary | ICD-10-CM | POA: Diagnosis not present

## 2019-09-08 DIAGNOSIS — M5136 Other intervertebral disc degeneration, lumbar region: Secondary | ICD-10-CM | POA: Diagnosis not present

## 2019-10-06 DIAGNOSIS — E119 Type 2 diabetes mellitus without complications: Secondary | ICD-10-CM | POA: Diagnosis not present

## 2019-10-06 DIAGNOSIS — G894 Chronic pain syndrome: Secondary | ICD-10-CM | POA: Diagnosis not present

## 2019-10-06 DIAGNOSIS — E1165 Type 2 diabetes mellitus with hyperglycemia: Secondary | ICD-10-CM | POA: Diagnosis not present

## 2019-10-06 DIAGNOSIS — E663 Overweight: Secondary | ICD-10-CM | POA: Diagnosis not present

## 2019-10-06 DIAGNOSIS — M159 Polyosteoarthritis, unspecified: Secondary | ICD-10-CM | POA: Diagnosis not present

## 2019-10-06 DIAGNOSIS — Z6827 Body mass index (BMI) 27.0-27.9, adult: Secondary | ICD-10-CM | POA: Diagnosis not present

## 2019-10-08 DIAGNOSIS — Z720 Tobacco use: Secondary | ICD-10-CM | POA: Diagnosis not present

## 2019-10-08 DIAGNOSIS — E1165 Type 2 diabetes mellitus with hyperglycemia: Secondary | ICD-10-CM | POA: Diagnosis not present

## 2019-10-08 DIAGNOSIS — I10 Essential (primary) hypertension: Secondary | ICD-10-CM | POA: Diagnosis not present

## 2019-10-08 DIAGNOSIS — J449 Chronic obstructive pulmonary disease, unspecified: Secondary | ICD-10-CM | POA: Diagnosis not present

## 2019-11-09 DIAGNOSIS — J449 Chronic obstructive pulmonary disease, unspecified: Secondary | ICD-10-CM | POA: Diagnosis not present

## 2019-11-09 DIAGNOSIS — Z720 Tobacco use: Secondary | ICD-10-CM | POA: Diagnosis not present

## 2019-11-09 DIAGNOSIS — I10 Essential (primary) hypertension: Secondary | ICD-10-CM | POA: Diagnosis not present

## 2019-11-09 DIAGNOSIS — E1165 Type 2 diabetes mellitus with hyperglycemia: Secondary | ICD-10-CM | POA: Diagnosis not present

## 2019-11-11 DIAGNOSIS — G894 Chronic pain syndrome: Secondary | ICD-10-CM | POA: Diagnosis not present

## 2019-12-06 DIAGNOSIS — R42 Dizziness and giddiness: Secondary | ICD-10-CM | POA: Diagnosis not present

## 2019-12-06 DIAGNOSIS — Z6828 Body mass index (BMI) 28.0-28.9, adult: Secondary | ICD-10-CM | POA: Diagnosis not present

## 2019-12-06 DIAGNOSIS — G894 Chronic pain syndrome: Secondary | ICD-10-CM | POA: Diagnosis not present

## 2019-12-06 DIAGNOSIS — R0989 Other specified symptoms and signs involving the circulatory and respiratory systems: Secondary | ICD-10-CM | POA: Diagnosis not present

## 2019-12-09 DIAGNOSIS — M159 Polyosteoarthritis, unspecified: Secondary | ICD-10-CM | POA: Diagnosis not present

## 2019-12-09 DIAGNOSIS — G894 Chronic pain syndrome: Secondary | ICD-10-CM | POA: Diagnosis not present

## 2019-12-09 DIAGNOSIS — E663 Overweight: Secondary | ICD-10-CM | POA: Diagnosis not present

## 2019-12-29 DIAGNOSIS — Z8673 Personal history of transient ischemic attack (TIA), and cerebral infarction without residual deficits: Secondary | ICD-10-CM | POA: Diagnosis not present

## 2019-12-29 DIAGNOSIS — I10 Essential (primary) hypertension: Secondary | ICD-10-CM | POA: Diagnosis not present

## 2019-12-29 DIAGNOSIS — I6522 Occlusion and stenosis of left carotid artery: Secondary | ICD-10-CM | POA: Diagnosis not present

## 2019-12-29 DIAGNOSIS — E785 Hyperlipidemia, unspecified: Secondary | ICD-10-CM | POA: Diagnosis not present

## 2019-12-29 DIAGNOSIS — R42 Dizziness and giddiness: Secondary | ICD-10-CM | POA: Diagnosis not present

## 2020-01-04 DIAGNOSIS — R002 Palpitations: Secondary | ICD-10-CM | POA: Diagnosis not present

## 2020-01-04 DIAGNOSIS — G894 Chronic pain syndrome: Secondary | ICD-10-CM | POA: Diagnosis not present

## 2020-01-08 DIAGNOSIS — E663 Overweight: Secondary | ICD-10-CM | POA: Diagnosis not present

## 2020-01-08 DIAGNOSIS — M159 Polyosteoarthritis, unspecified: Secondary | ICD-10-CM | POA: Diagnosis not present

## 2020-01-08 DIAGNOSIS — G894 Chronic pain syndrome: Secondary | ICD-10-CM | POA: Diagnosis not present

## 2020-02-02 DIAGNOSIS — G894 Chronic pain syndrome: Secondary | ICD-10-CM | POA: Diagnosis not present

## 2020-02-08 DIAGNOSIS — G894 Chronic pain syndrome: Secondary | ICD-10-CM | POA: Diagnosis not present

## 2020-02-08 DIAGNOSIS — M159 Polyosteoarthritis, unspecified: Secondary | ICD-10-CM | POA: Diagnosis not present

## 2020-02-08 DIAGNOSIS — E663 Overweight: Secondary | ICD-10-CM | POA: Diagnosis not present

## 2020-03-01 DIAGNOSIS — J449 Chronic obstructive pulmonary disease, unspecified: Secondary | ICD-10-CM | POA: Diagnosis not present

## 2020-03-01 DIAGNOSIS — Z6827 Body mass index (BMI) 27.0-27.9, adult: Secondary | ICD-10-CM | POA: Diagnosis not present

## 2020-03-01 DIAGNOSIS — M159 Polyosteoarthritis, unspecified: Secondary | ICD-10-CM | POA: Diagnosis not present

## 2020-03-01 DIAGNOSIS — G8929 Other chronic pain: Secondary | ICD-10-CM | POA: Diagnosis not present

## 2020-03-02 DIAGNOSIS — E119 Type 2 diabetes mellitus without complications: Secondary | ICD-10-CM | POA: Diagnosis not present

## 2020-03-10 DIAGNOSIS — E663 Overweight: Secondary | ICD-10-CM | POA: Diagnosis not present

## 2020-03-10 DIAGNOSIS — G894 Chronic pain syndrome: Secondary | ICD-10-CM | POA: Diagnosis not present

## 2020-03-10 DIAGNOSIS — M159 Polyosteoarthritis, unspecified: Secondary | ICD-10-CM | POA: Diagnosis not present

## 2020-03-16 ENCOUNTER — Other Ambulatory Visit: Payer: Self-pay | Admitting: Cardiology

## 2020-03-16 ENCOUNTER — Encounter: Payer: Self-pay | Admitting: Cardiology

## 2020-03-16 ENCOUNTER — Ambulatory Visit (INDEPENDENT_AMBULATORY_CARE_PROVIDER_SITE_OTHER): Payer: Medicare HMO

## 2020-03-16 ENCOUNTER — Ambulatory Visit: Payer: Medicare HMO | Admitting: Cardiology

## 2020-03-16 VITALS — BP 133/79 | HR 96 | Resp 16 | Ht 68.0 in | Wt 176.0 lb

## 2020-03-16 DIAGNOSIS — R42 Dizziness and giddiness: Secondary | ICD-10-CM | POA: Diagnosis not present

## 2020-03-16 DIAGNOSIS — R002 Palpitations: Secondary | ICD-10-CM

## 2020-03-16 DIAGNOSIS — G458 Other transient cerebral ischemic attacks and related syndromes: Secondary | ICD-10-CM

## 2020-03-16 NOTE — Patient Instructions (Signed)
Medication Instructions:   Your physician recommends that you continue on your current medications as directed. Please refer to the Current Medication list given to you today.  Labwork:  None  Testing/Procedures: ZIO- Long Term Monitor Instructions   Your physician has requested you wear your ZIO patch monitor 3 days.   This is a single patch monitor.  [employer] supplies one patch monitor per enrollment.  Additional stickers are not available.   Please do not apply patch if you will be having a Nuclear Stress Test, Echocardiogram, Cardiac CT, MRI, or Chest Xray during the time frame you would be wearing the monitor. The patch cannot be worn during these tests.  You cannot remove and re-apply the ZIO XT patch monitor.     Once you have received you monitor, please review enclosed instructions.  Your monitor has already been registered assigning a specific monitor serial # to you.   Applying the monitor   Shave hair from upper left chest.   Hold abrader disc by orange tab.  Rub abrader in 40 strokes over left upper chest as indicated in your monitor instructions.   Clean area with 4 enclosed alcohol pads .  Use all pads to assure are is cleaned thoroughly.  Let dry.   Apply patch as indicated in monitor instructions.  Patch will be place under collarbone on left side of chest with arrow pointing upward.   Rub patch adhesive wings for 2 minutes.Remove white label marked "1".  Remove white label marked "2".  Rub patch adhesive wings for 2 additional minutes.   While looking in a mirror, press and release button in center of patch.  A small green light will flash 3-4 times .  This will be your only indicator the monitor has been turned on.     Do not shower for the first 24 hours.  You may shower after the first 24 hours.   Press button if you feel a symptom. You will hear a small click.  Record Date, Time and Symptom in the Patient Log Book.   When you are ready to remove patch,  follow instructions on last 2 pages of Patient Log Book.  Stick patch monitor onto last page of Patient Log Book.   Place Patient Log Book in [place] box.  Use locking tab on box and tape box closed securely.  The [employer] and [employer] has [employer] on it.  Please place in mailbox as soon as possible.  Your physician should have your test results approximately 7 days after the monitor has been mailed back to [facility].   Call [facility] Customer Care at [phone] if you have questions regarding your ZIO XT patch monitor.  Call them immediately if you see an orange light blinking on your monitor.    If your monitor falls off in less than 4 days contact our Monitor department at [phone].  If your monitor becomes loose or falls off after 4 days call [employer] at [phone] for suggestions on securing your monitor.  Follow-Up:  Your physician recommends that you schedule a follow-up appointment in: 3 months  Any Other Special Instructions Will Be Listed Below (If Applicable).  You have been referred to a Vascular doctor.  If you need a refill on your cardiac medications before your next appointment, please call your pharmacy.

## 2020-03-16 NOTE — Progress Notes (Signed)
Clinical Summary Mr. Maultsby is a 60 y.o.male seen today as a new consult, referred by PA Mann for palpitations  1. Palpitations - feeling of prominent heart beats. Symptoms come and go. Most common at night. Can have some heaviness on chest with episodes   2. Dizziness - frequent lightheadedness/dizziness - can occur sitting, can occur standing.  - stays well hydrated. 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 Anxiety   . Back pain   . Bipolar disorder (HCC)   . Borderline hypertension   . Chronic pain   . COPD (chronic obstructive pulmonary disease) (HCC)   . Depression with anxiety   . GERD (gastroesophageal reflux disease)   . Headache   . Hip pain   . Hypercholesterolemia   . Stroke (HCC)   . Tobacco use      Allergies  Allergen Reactions  . Penicillins Anaphylaxis  . Latex   . Morphine And Related   . Vicodin [Hydrocodone-Acetaminophen]      Current Outpatient Medications  Medication Sig Dispense Refill  . albuterol (PROVENTIL HFA;VENTOLIN HFA) 108 (90 BASE) MCG/ACT inhaler Inhale 2 puffs into the lungs every 6 (six) hours as needed for wheezing.    [name] albuterol (PROVENTIL) (2.5 MG/3ML) 0.083% nebulizer solution     . albuterol (PROVENTIL) 2 MG tablet Take 2 mg by mouth 3 (three) times daily.    [name] allopurinol (ZYLOPRIM) 100 MG tablet Take 100 mg by mouth daily.     [name] aspirin 81 MG tablet Take 81 mg by mouth daily.    . cyclobenzaprine (FLEXERIL) 10 MG tablet Take 10 mg by mouth 3 (three) times daily as needed.     . diazepam (VALIUM) 5 MG tablet Take 1 tablet (5 mg total) by mouth 2 (two) times daily. 30 tablet 0  . Diclofenac Sodium CR 100 MG 24 hr tablet Take 100 mg by mouth daily.     . fentaNYL (DURAGESIC - DOSED MCG/HR) 50 MCG/HR Place 1 patch onto the skin every 3 (three) days.    . folic acid (FOLVITE) 1 MG tablet Take 1 mg by mouth daily.    [name] gabapentin (NEURONTIN) 300 MG  capsule Take 300 mg by mouth 3 (three) times daily.    [name] glipiZIDE (GLUCOTROL) 5 MG tablet Take 0.5 tablets (2.5 mg total) by mouth 2 (two) times daily for 7 days. 7 tablet 0  . metFORMIN (GLUCOPHAGE) 500 MG tablet Take 2 tablets by mouth 2 (two) times daily.    . nicotine (NICODERM CQ - DOSED IN MG/24 HOURS) 14 mg/24hr patch Place 1 patch (14 mg total) onto the skin daily. 28 patch 0  . omeprazole (PRILOSEC) 20 MG capsule TAKE (1) CAPSULE BY MOUTH ONCE DAILY. 30 capsule 5  . oxyCODONE-acetaminophen (PERCOCET) 10-325 MG per tablet Take 1-2 tablets by mouth every 6 (six) hours as needed for pain.     [name] SPIRIVA HANDIHALER 18 MCG inhalation capsule Place 18 mcg into inhaler and inhale daily.     . tamsulosin (FLOMAX) 0.4 MG CAPS capsule Take 0.4 mg by mouth daily after supper.     No current facility-administered medications for this visit.     Past Surgical History:  Procedure Laterality Date  . ANTERIOR FUSION CERVICAL SPINE    . BACK SURGERY    . CHOLECYSTECTOMY    . COLONOSCOPY N/A 10/28/2012   [id number] polyp-hyperplastic. Internal hemorrhoids. Distal  5 cm of terminal ileum appeared normal.  . ESOPHAGOGASTRODUODENOSCOPY N/A 10/28/2012   [id number] gastric mucosa with mottling and submucosal petechiae. Mild chronic gastritis but no H. pylori home path. Small hiatal hernia. Duodenal lipoma  . EYE SURGERY  Left eye   as a child  . LUMBAR SPINE SURGERY     3 x      Allergies  Allergen Reactions  . Penicillins Anaphylaxis  . Latex   . Morphine And Related   . Vicodin [Hydrocodone-Acetaminophen]       Family History  Problem Relation Age of Onset  . Stomach cancer Mother   . Throat cancer Father      Social History Mr. Viets reports that he has been smoking cigarettes. He has a 60.00 pack-year smoking history. He has never used smokeless tobacco. Mr. Matranga reports no history of alcohol use.   Review of Systems CONSTITUTIONAL: No weight loss, fever, chills, weakness or  fatigue.  HEENT: Eyes: No visual loss, blurred vision, double vision or yellow sclerae.No hearing loss, sneezing, congestion, runny nose or sore throat.  SKIN: No rash or itching.  CARDIOVASCULAR: per hpi RESPIRATORY: No shortness of breath, cough or sputum.  GASTROINTESTINAL: No anorexia, nausea, vomiting or diarrhea. No abdominal pain or blood.  GENITOURINARY: No burning on urination, no polyuria NEUROLOGICAL: per hpi  MUSCULOSKELETAL: No muscle, back pain, joint pain or stiffness.  LYMPHATICS: No enlarged nodes. No history of splenectomy.  PSYCHIATRIC: No history of depression or anxiety.  ENDOCRINOLOGIC: No reports of sweating, cold or heat intolerance. No polyuria or polydipsia.  [name]   Physical Examination Today's Vitals   03/16/20 1046  BP: (!) 146/66  Pulse: 90  Resp: 16  SpO2: 98%  Weight: 176 lb (79.8 kg)  Height: 5\' 8"  (1.727 m)   Body mass index is 26.76 kg/m.  Gen: resting comfortably, no acute distress HEENT: no scleral icterus, pupils equal round and reactive, no palptable cervical adenopathy,  CV: RRR, no m/rg, no jvd Resp: Clear to auscultation bilaterally GI: abdomen is soft, non-tender, non-distended, normal bowel sounds, no hepatosplenomegaly MSK: extremities are warm, no edema.  Skin: warm, no rash Neuro:  no focal deficits Psych: appropriate affect   Diagnostic Studies 12/2019 carotid 01/2020 IMPRESSION: 1. Retrograde flow within the left vertebral artery with associated asymmetrically decreased blood pressure within the left upper extremity - constellation of findings could be seen in the setting of subclavian steal syndrome. Clinical correlation is advised. Further evaluation with CTA could be performed as indicated. 2. Minimal amount of left-sided atherosclerotic plaque, not resulting in a hemodynamically significant stenosis. 3. Normal sonographic evaluation of the right carotid system.    Assessment and Plan  1. Palpitations - will obtain a 48  hour holter monitor - EKG today shows NSR  2. 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 Some orthostatic change lying to sitting, but other numbers are fine, may have been affected by limitation in positioning. Symptoms are not purely orthostatic by history.      [place], M.D.

## 2020-03-18 DIAGNOSIS — R002 Palpitations: Secondary | ICD-10-CM | POA: Diagnosis not present

## 2020-03-28 ENCOUNTER — Other Ambulatory Visit: Payer: Self-pay | Admitting: *Deleted

## 2020-03-28 DIAGNOSIS — I6529 Occlusion and stenosis of unspecified carotid artery: Secondary | ICD-10-CM

## 2020-03-31 DIAGNOSIS — R002 Palpitations: Secondary | ICD-10-CM | POA: Diagnosis not present

## 2020-04-08 DIAGNOSIS — M159 Polyosteoarthritis, unspecified: Secondary | ICD-10-CM | POA: Diagnosis not present

## 2020-04-08 DIAGNOSIS — G894 Chronic pain syndrome: Secondary | ICD-10-CM | POA: Diagnosis not present

## 2020-04-08 DIAGNOSIS — E663 Overweight: Secondary | ICD-10-CM | POA: Diagnosis not present

## 2020-04-10 ENCOUNTER — Encounter: Payer: Self-pay | Admitting: Vascular Surgery

## 2020-04-10 ENCOUNTER — Ambulatory Visit (INDEPENDENT_AMBULATORY_CARE_PROVIDER_SITE_OTHER): Payer: Medicare HMO

## 2020-04-10 ENCOUNTER — Ambulatory Visit: Payer: Medicare HMO | Admitting: Vascular Surgery

## 2020-04-10 ENCOUNTER — Other Ambulatory Visit: Payer: Self-pay

## 2020-04-10 ENCOUNTER — Encounter: Payer: Medicare HMO | Admitting: Vascular Surgery

## 2020-04-10 VITALS — BP 101/64 | HR 108 | Temp 98.1°F | Resp 16 | Ht 68.0 in | Wt 169.0 lb

## 2020-04-10 DIAGNOSIS — I771 Stricture of artery: Secondary | ICD-10-CM

## 2020-04-10 DIAGNOSIS — I6529 Occlusion and stenosis of unspecified carotid artery: Secondary | ICD-10-CM
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
## 2020-04-11 ENCOUNTER — Other Ambulatory Visit: Payer: Self-pay

## 2020-04-11 DIAGNOSIS — I771 Stricture of artery: Secondary | ICD-10-CM

## 2020-04-11 DIAGNOSIS — Z0000 Encounter for general adult medical examination without abnormal findings: Secondary | ICD-10-CM | POA: Diagnosis not present

## 2020-04-11 DIAGNOSIS — Z6826 Body mass index (BMI) 26.0-26.9, adult: Secondary | ICD-10-CM | POA: Diagnosis not present

## 2020-04-11 DIAGNOSIS — E7849 Other hyperlipidemia: Secondary | ICD-10-CM | POA: Diagnosis not present

## 2020-04-11 DIAGNOSIS — M5136 Other intervertebral disc degeneration, lumbar region: Secondary | ICD-10-CM | POA: Diagnosis not present

## 2020-04-11 DIAGNOSIS — Z1331 Encounter for screening for depression: Secondary | ICD-10-CM | POA: Diagnosis not present

## 2020-04-11 DIAGNOSIS — Z1389 Encounter for screening for other disorder: Secondary | ICD-10-CM | POA: Diagnosis not present

## 2020-04-11 DIAGNOSIS — E1165 Type 2 diabetes mellitus with hyperglycemia: Secondary | ICD-10-CM | POA: Diagnosis not present

## 2020-04-14 ENCOUNTER — Telehealth: Payer: Self-pay | Admitting: *Deleted

## 2020-04-14 NOTE — Telephone Encounter (Signed)
-----   Message from Arnoldo Lenis, MD sent at 04/11/2020 12:52 PM EST ----- Heart monitor shows some occasional extra heart beats, at times can have several in a row. This is not dangerous but can cause symptoms of the heart racing. How are his symptoms doing, if ongoing could consider a  medication   Zandra Abts MD

## 2020-04-18 NOTE — Telephone Encounter (Signed)
Pt voiced understanding - says symptoms remain the same - has upcoming CT and appt with Dr Donnetta Hutching and wants to wait on adding a medication until test and appt are done

## 2020-04-21 ENCOUNTER — Encounter (HOSPITAL_COMMUNITY): Payer: Self-pay

## 2020-04-21 ENCOUNTER — Ambulatory Visit (HOSPITAL_COMMUNITY)
Admission: RE | Admit: 2020-04-21 | Discharge: 2020-04-21 | Disposition: A | Discharge status: Home / Self Care | Payer: Medicare HMO | Source: Ambulatory Visit | Attending: Vascular Surgery | Admitting: Vascular Surgery

## 2020-04-21 ENCOUNTER — Other Ambulatory Visit: Payer: Self-pay

## 2020-04-21 DIAGNOSIS — I771 Stricture of artery: Secondary | ICD-10-CM

## 2020-04-24 ENCOUNTER — Ambulatory Visit: Payer: Medicare HMO | Admitting: Vascular Surgery

## 2020-04-24 ENCOUNTER — Encounter: Payer: Self-pay | Admitting: Vascular Surgery

## 2020-04-24 ENCOUNTER — Other Ambulatory Visit: Payer: Self-pay

## 2020-05-05 ENCOUNTER — Other Ambulatory Visit: Payer: Self-pay

## 2020-05-05 ENCOUNTER — Ambulatory Visit
Admission: RE | Admit: 2020-05-05 | Discharge: 2020-05-05 | Disposition: A | Discharge status: Home / Self Care | Payer: Medicare HMO | Source: Ambulatory Visit | Attending: Vascular Surgery | Admitting: Vascular Surgery

## 2020-05-05 DIAGNOSIS — R42 Dizziness and giddiness: Secondary | ICD-10-CM | POA: Diagnosis not present

## 2020-05-05 DIAGNOSIS — I771 Stricture of artery: Secondary | ICD-10-CM

## 2020-05-05 MED ORDER — IOPAMIDOL (ISOVUE-370) INJECTION 76%
75.0000 mL | Freq: Once | INTRAVENOUS | Status: AC | PRN
  Administered 2020-05-05: 75 mL via INTRAVENOUS

## 2020-05-08 ENCOUNTER — Ambulatory Visit: Payer: Medicare HMO | Admitting: Vascular Surgery

## 2020-05-08 ENCOUNTER — Other Ambulatory Visit: Payer: Self-pay

## 2020-05-08 ENCOUNTER — Encounter: Payer: Self-pay | Admitting: Vascular Surgery

## 2020-05-08 VITALS — BP 114/70 | HR 102 | Temp 98.6°F | Resp 14 | Ht 68.0 in | Wt 169.0 lb

## 2020-05-08 DIAGNOSIS — M5136 Other intervertebral disc degeneration, lumbar region: Secondary | ICD-10-CM | POA: Diagnosis not present

## 2020-05-08 DIAGNOSIS — I771 Stricture of artery: Secondary | ICD-10-CM | POA: Diagnosis not present

## 2020-05-08 DIAGNOSIS — M159 Polyosteoarthritis, unspecified: Secondary | ICD-10-CM | POA: Diagnosis not present

## 2020-05-08 DIAGNOSIS — G894 Chronic pain syndrome: Secondary | ICD-10-CM | POA: Diagnosis not present

## 2020-05-08 DIAGNOSIS — M5030 Other cervical disc degeneration, unspecified cervical region: Secondary | ICD-10-CM | POA: Diagnosis not present

## 2020-05-08 NOTE — Progress Notes (Signed)
Vascular and Vein Specialist of [place]  Patient name: Raymond [name] MRN: [id number] DOB: June 09, 1960 Sex: male  REASON FOR VISIT: Here today for continued evaluation of left subclavian stenosis and possible vertebral steal syndrome.  HPI: Raymond [name] is a 60 y.o. male he underwent CT scan of his neck and head.  I have reviewed his films and he is here today for discussion of this.  He reports no new symptoms.  He does report pain and cool sensation in his upper and lower extremities bilaterally.  He does report some ongoing dizziness.  Past Medical History:  Diagnosis Date  . Anxiety   . Back pain   . Bipolar disorder ([place])   . Borderline hypertension   . Chronic pain   . COPD (chronic obstructive pulmonary disease) ([place])   . Depression with anxiety   . GERD (gastroesophageal reflux disease)   . Headache   . Hip pain   . Hypercholesterolemia   . Stroke ([place])   . Tobacco use     Family History  Problem Relation Age of Onset  . Stomach cancer Mother   . Throat cancer Father     SOCIAL HISTORY: Social History   Tobacco Use  . Smoking status: Current Every Day Smoker    Packs/day: 2.00    Years: 30.00    Pack years: 60.00    Types: Cigarettes  . Smokeless tobacco: Never Used  Substance Use Topics  . Alcohol use: No    Alcohol/week: 0.0 standard drinks    Comment: history of ETOH abuse in past, none in 4 years    Allergies  Allergen Reactions  . Penicillins Anaphylaxis  . Latex   . Morphine And Related   . Vicodin [Hydrocodone-Acetaminophen]     Current Outpatient Medications  Medication Sig Dispense Refill  . albuterol (PROVENTIL HFA;VENTOLIN HFA) 108 (90 BASE) MCG/ACT inhaler Inhale 2 puffs into the lungs every 6 (six) hours as needed for wheezing.    [name] albuterol (PROVENTIL) (2.5 MG/3ML) 0.083% nebulizer solution     . albuterol (PROVENTIL) 2 MG tablet Take 2 mg by mouth 3 (three) times daily.    [name] allopurinol (ZYLOPRIM)  100 MG tablet Take 100 mg by mouth daily.     [name] aspirin 81 MG tablet Take 81 mg by mouth daily.    . diclofenac (VOLTAREN) 50 MG EC tablet Take 50 mg by mouth 3 (three) times daily.    . Diclofenac Sodium CR 100 MG 24 hr tablet Take 100 mg by mouth daily.     [name] gabapentin (NEURONTIN) 300 MG capsule Take 300 mg by mouth 3 (three) times daily.    . metFORMIN (GLUCOPHAGE) 500 MG tablet Take 2 tablets by mouth 2 (two) times daily.    [name] oxyCODONE-acetaminophen (PERCOCET) 10-325 MG per tablet Take 1-2 tablets by mouth every 6 (six) hours as needed for pain.     . rosuvastatin (CRESTOR) 10 MG tablet Take 10 mg by mouth daily.    . tamsulosin (FLOMAX) 0.4 MG CAPS capsule Take 0.4 mg by mouth daily after supper.    . folic acid (FOLVITE) 1 MG tablet Take 1 mg by mouth daily. (Patient not taking: No sig reported)    . SPIRIVA HANDIHALER 18 MCG inhalation capsule Place 18 mcg into inhaler and inhale daily.  (Patient not taking: No sig reported)     No current facility-administered medications for this visit.    REVIEW OF SYSTEMS:  [X]  denotes positive finding, [ ]   denotes negative finding Cardiac  Comments:  Chest pain or chest pressure:    Shortness of breath upon exertion:    Short of breath when lying flat:    Irregular heart rhythm:        Vascular    Pain in calf, thigh, or hip brought on by ambulation:    Pain in feet at night that wakes you up from your sleep:     Blood clot in your veins:    Leg swelling:           PHYSICAL EXAM: Vitals:   05/08/20 1511  BP: 114/70  Pulse: (!) 102  Resp: 14  Temp: 98.6 F (37 C)  TempSrc: Other (Comment)  SpO2: 95%  Weight: 169 lb (76.7 kg)  Height: 5\' 8"  (1.727 m)    GENERAL: The patient is a well-nourished male, in no acute distress. The vital signs are documented above. CARDIOVASCULAR: 2+ right radial pulse and 1+ left radial pulse PULMONARY: There is good air exchange  MUSCULOSKELETAL: There are no major deformities or  cyanosis. NEUROLOGIC: No focal weakness or paresthesias are detected. SKIN: There are no ulcers or rashes noted. PSYCHIATRIC: The patient has a normal affect.  DATA:  CT angiogram was reviewed.  This does show moderate to severe approximately 60% stenosis at the proximal left subclavian artery.  This is proximal to the left vertebral artery takeoff.  He has a small left vertebral artery and the artery is actually occluded in the cervical portion.  The right vertebral artery is widely patent throughout its course and is widely patent into the basilar artery. He has no evidence of significant carotid disease bilaterally  MEDICAL ISSUES: I discussed these findings with the patient.  I explained that he would not have risk for retrograde flow in his left vertebral with vertebral steal syndrome since his left vertebral artery is occluded.  He is not having any exercise pain issues with his left arm.  He is having pain on all 4 extremities equally.  He does have dizziness but certainly this would not expect to be corrected with correction of his left subclavian stenosis.  I did discuss how this would be treated with balloon angioplasty but do not see any indication for this since he is having no symptoms of arm ischemia and physiologically could not have vertebral steal syndrome.  He understands and will see [place] again on an as-needed basis    Rosetta Posner, MD FACS Vascular and Vein Specialists of [place] Office Tel [phone]  Note: Portions of this report may have been transcribed using voice recognition software.  Every effort has been made to ensure accuracy; however, inadvertent computerized transcription errors may still be present.

## 2020-06-05 DIAGNOSIS — G894 Chronic pain syndrome: Secondary | ICD-10-CM | POA: Diagnosis not present

## 2020-06-05 DIAGNOSIS — M159 Polyosteoarthritis, unspecified: Secondary | ICD-10-CM | POA: Diagnosis not present

## 2020-06-05 DIAGNOSIS — M5136 Other intervertebral disc degeneration, lumbar region: Secondary | ICD-10-CM | POA: Diagnosis not present

## 2020-06-07 DIAGNOSIS — E1165 Type 2 diabetes mellitus with hyperglycemia: Secondary | ICD-10-CM | POA: Diagnosis not present

## 2020-06-07 DIAGNOSIS — I10 Essential (primary) hypertension: Secondary | ICD-10-CM | POA: Diagnosis not present

## 2020-06-19 ENCOUNTER — Encounter: Payer: Self-pay | Admitting: Cardiology

## 2020-06-19 ENCOUNTER — Ambulatory Visit: Payer: Medicare HMO | Admitting: Cardiology

## 2020-06-19 VITALS — BP 120/70 | HR 80 | Ht 68.0 in | Wt 168.2 lb

## 2020-06-19 DIAGNOSIS — R002 Palpitations: Secondary | ICD-10-CM

## 2020-06-19 DIAGNOSIS — R42 Dizziness and giddiness: Secondary | ICD-10-CM

## 2020-06-19 MED ORDER — METOPROLOL TARTRATE 25 MG PO TABS: 12.5000 mg | ORAL_TABLET | Freq: Two times a day (BID) | ORAL | 1 refills | Status: DC

## 2020-06-19 NOTE — Patient Instructions (Signed)
Your physician recommends that you schedule a follow-up appointment in: [place] has recommended you make the following change in your medication:   START LOPRESSOR 12.5 MG (1/2 TABLET) TWICE DAILY   Thank you for choosing [facility]!!

## 2020-06-19 NOTE — Progress Notes (Signed)
Clinical Summary Raymond [name] is a 60 y.o.male seen today for follow up of the following medical problems.   1. Palpitations - feeling of prominent heart beats. Symptoms come and go. Most common at night. Can have some heaviness on chest with episodes  Jan 2022 holter rare ectopy, isolated 7 second run of SVT  - occasional ongoing palpitations.   2. Dizziness - frequent lightheadedness/dizziness - can occur sitting, can occur standing.  - stays well hydrated. - recent carotid US from pcp showed signs of subclavian steal  - he is on gabapentin, percocet, flomax all which can contribute to dizzienss.    3. Carotid stenosis/Subclavian stenosis - carotid US with mild bilateral ICA disease, +left subclavian stenosis  - seen by vascular, does not have signs of vertebral steal.    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  Past Medical History:  Diagnosis Date  . Anxiety   . Back pain   . Bipolar disorder ([place])   . Borderline hypertension   . Chronic pain   . COPD (chronic obstructive pulmonary disease) ([place])   . Depression with anxiety   . GERD (gastroesophageal reflux disease)   . Headache   . Hip pain   . Hypercholesterolemia   . Stroke ([place])   . Tobacco use      Allergies  Allergen Reactions  . Penicillins Anaphylaxis  . Latex   . Morphine And Related   . Vicodin [Hydrocodone-Acetaminophen]      Current Outpatient Medications  Medication Sig Dispense Refill  . albuterol (PROVENTIL HFA;VENTOLIN HFA) 108 (90 BASE) MCG/ACT inhaler Inhale 2 puffs into the lungs every 6 (six) hours as needed for wheezing.    [name] albuterol (PROVENTIL) (2.5 MG/3ML) 0.083% nebulizer solution     . albuterol (PROVENTIL) 2 MG tablet Take 2 mg by mouth 3 (three) times daily.    [name] allopurinol (ZYLOPRIM) 100 MG tablet Take 100 mg by mouth daily.     [name] aspirin 81 MG tablet Take 81 mg by mouth daily.    . diclofenac (VOLTAREN) 50 MG EC tablet Take 50 mg by  mouth 3 (three) times daily.    . Diclofenac Sodium CR 100 MG 24 hr tablet Take 100 mg by mouth daily.     . folic acid (FOLVITE) 1 MG tablet Take 1 mg by mouth daily. (Patient not taking: No sig reported)    . gabapentin (NEURONTIN) 300 MG capsule Take 300 mg by mouth 3 (three) times daily.    . metFORMIN (GLUCOPHAGE) 500 MG tablet Take 2 tablets by mouth 2 (two) times daily.    [name] oxyCODONE-acetaminophen (PERCOCET) 10-325 MG per tablet Take 1-2 tablets by mouth every 6 (six) hours as needed for pain.     . rosuvastatin (CRESTOR) 10 MG tablet Take 10 mg by mouth daily.    [name] SPIRIVA HANDIHALER 18 MCG inhalation capsule Place 18 mcg into inhaler and inhale daily.  (Patient not taking: No sig reported)    . tamsulosin (FLOMAX) 0.4 MG CAPS capsule Take 0.4 mg by mouth daily after supper.     No current facility-administered medications for this visit.     Past Surgical History:  Procedure Laterality Date  . ANTERIOR FUSION CERVICAL SPINE    . BACK SURGERY    . CHOLECYSTECTOMY    . COLONOSCOPY N/A 10/28/2012   URK:YHCWCB polyp-hyperplastic. Internal hemorrhoids. Distal 5 cm of terminal ileum appeared normal.  . ESOPHAGOGASTRODUODENOSCOPY N/A 10/28/2012  NIO:[id number] gastric mucosa with mottling and submucosal petechiae. Mild chronic gastritis but no H. pylori home path. Small hiatal hernia. Duodenal lipoma  . EYE SURGERY  Left eye   as a child  . LUMBAR SPINE SURGERY     3 x      Allergies  Allergen Reactions  . Penicillins Anaphylaxis  . Latex   . Morphine And Related   . Vicodin [Hydrocodone-Acetaminophen]       Family History  Problem Relation Age of Onset  . Stomach cancer Mother   . Throat cancer Father      Social History Mr. Dec reports that he has been smoking cigarettes. He has a 60.00 pack-year smoking history. He has never used smokeless tobacco. Mr. Ervine reports no history of alcohol use.   Review of Systems CONSTITUTIONAL: No weight loss, fever, chills,  weakness or fatigue.  HEENT: Eyes: No visual loss, blurred vision, double vision or yellow sclerae.No hearing loss, sneezing, congestion, runny nose or sore throat.  SKIN: No rash or itching.  CARDIOVASCULAR: per hpi RESPIRATORY: No shortness of breath, cough or sputum.  GASTROINTESTINAL: No anorexia, nausea, vomiting or diarrhea. No abdominal pain or blood.  GENITOURINARY: No burning on urination, no polyuria NEUROLOGICAL: No headache, dizziness, syncope, paralysis, ataxia, numbness or tingling in the extremities. No change in bowel or bladder control.  MUSCULOSKELETAL: No muscle, back pain, joint pain or stiffness.  LYMPHATICS: No enlarged nodes. No history of splenectomy.  PSYCHIATRIC: No history of depression or anxiety.  ENDOCRINOLOGIC: No reports of sweating, cold or heat intolerance. No polyuria or polydipsia.  [name]   Physical Examination Today's Vitals   06/19/20 1120  BP: 120/70  Pulse: 80  SpO2: 96%  Weight: 168 lb 3.2 oz (76.3 kg)  Height: 5\' 8"  (1.727 m)   Body mass index is 25.57 kg/m.  Gen: resting comfortably, no acute distress HEENT: no scleral icterus, pupils equal round and reactive, no palptable cervical adenopathy,  CV: RRR, no m/r/g, no jvd Resp: Clear to auscultation bilaterally GI: abdomen is soft, non-tender, non-distended, normal bowel sounds, no hepatosplenomegaly MSK: extremities are warm, no edema.  Skin: warm, no rash Neuro:  no focal deficits Psych: appropriate affect   Diagnostic Studies 12/2019 carotid US IMPRESSION: 1. Retrograde flow within the left vertebral artery with associated asymmetrically decreased blood pressure within the left upper extremity - constellation of findings could be seen in the setting of subclavian steal syndrome. Clinical correlation is advised. Further evaluation with CTA could be performed as indicated. 2. Minimal amount of left-sided atherosclerotic plaque, not resulting in a hemodynamically significant  stenosis. 3. Normal sonographic evaluation of the right carotid system.  Jan 2022 holter  No symptoms reported  Overall rare supraventricular and ventricular ectopy, isolated run of SVT up to 7.3 seconds    Assessment and Plan  1. Palpitations - monitor with rare ectopy, short run of SVT - start lopressor 12.5mg  bid for ongoing symptoms. Unclear if ectopy and short runs of SVT playing a role in his dizziness, will monitor.         Arnoldo Lenis, M.D.

## 2020-06-27 DIAGNOSIS — M5136 Other intervertebral disc degeneration, lumbar region: Secondary | ICD-10-CM | POA: Diagnosis not present

## 2020-06-27 DIAGNOSIS — K219 Gastro-esophageal reflux disease without esophagitis: Secondary | ICD-10-CM | POA: Diagnosis not present

## 2020-06-27 DIAGNOSIS — M5030 Other cervical disc degeneration, unspecified cervical region: Secondary | ICD-10-CM | POA: Diagnosis not present

## 2020-06-27 DIAGNOSIS — M1991 Primary osteoarthritis, unspecified site: Secondary | ICD-10-CM | POA: Diagnosis not present

## 2020-06-27 DIAGNOSIS — G894 Chronic pain syndrome: Secondary | ICD-10-CM | POA: Diagnosis not present

## 2020-06-27 DIAGNOSIS — K5900 Constipation, unspecified: Secondary | ICD-10-CM | POA: Diagnosis not present

## 2020-06-27 DIAGNOSIS — Z6825 Body mass index (BMI) 25.0-25.9, adult: Secondary | ICD-10-CM | POA: Diagnosis not present

## 2020-07-04 ENCOUNTER — Encounter: Payer: Self-pay | Admitting: Internal Medicine

## 2020-07-08 DIAGNOSIS — I10 Essential (primary) hypertension: Secondary | ICD-10-CM | POA: Diagnosis not present

## 2020-07-08 DIAGNOSIS — E1165 Type 2 diabetes mellitus with hyperglycemia: Secondary | ICD-10-CM | POA: Diagnosis not present

## 2020-07-14 ENCOUNTER — Other Ambulatory Visit: Payer: Self-pay

## 2020-07-14 ENCOUNTER — Encounter (HOSPITAL_COMMUNITY): Payer: Self-pay | Admitting: *Deleted

## 2020-07-14 ENCOUNTER — Emergency Department (HOSPITAL_COMMUNITY): Payer: Medicare HMO

## 2020-07-14 ENCOUNTER — Emergency Department (HOSPITAL_COMMUNITY)
Admission: EM | Admit: 2020-07-14 | Discharge: 2020-07-14 | Disposition: A | Discharge status: Home / Self Care | Payer: Medicare HMO | Attending: Emergency Medicine | Admitting: Emergency Medicine

## 2020-07-14 DIAGNOSIS — F17200 Nicotine dependence, unspecified, uncomplicated: Secondary | ICD-10-CM | POA: Diagnosis not present

## 2020-07-14 DIAGNOSIS — R202 Paresthesia of skin: Secondary | ICD-10-CM | POA: Insufficient documentation

## 2020-07-14 DIAGNOSIS — R109 Unspecified abdominal pain: Secondary | ICD-10-CM | POA: Diagnosis not present

## 2020-07-14 DIAGNOSIS — Z5321 Procedure and treatment not carried out due to patient leaving prior to being seen by health care provider: Secondary | ICD-10-CM | POA: Diagnosis not present

## 2020-07-14 DIAGNOSIS — R079 Chest pain, unspecified: Secondary | ICD-10-CM | POA: Insufficient documentation

## 2020-07-14 DIAGNOSIS — J449 Chronic obstructive pulmonary disease, unspecified: Secondary | ICD-10-CM | POA: Diagnosis not present

## 2020-07-14 DIAGNOSIS — E119 Type 2 diabetes mellitus without complications: Secondary | ICD-10-CM | POA: Diagnosis not present

## 2020-07-14 DIAGNOSIS — R9431 Abnormal electrocardiogram [ECG] [EKG]: Secondary | ICD-10-CM | POA: Diagnosis not present

## 2020-07-14 NOTE — ED Provider Notes (Signed)
MSE was initiated and I personally evaluated the patient and placed orders (if any) at  2:52 PM on Jul 14, 2020.  The patient appears stable so that the remainder of the MSE may be completed by another provider.   Chief Complaint:  Multiple complaints   HPI:   Patient presents for multiple complaints for the past 3 weeks.  States that he is having chest pain, abdominal pain, back pain, upper and lower extremity pain.  Patient states that his entire body is numb from his head to his toe, states that he cannot walk due to this.  All the symptoms have been going on for 3 weeks, have not been worsening.   ROS:    Physical Exam:  Gen:                Awake, no distress, older than the.  Age, chronically ill-appearing HEENT:          Atraumatic  Resp:              Normal effort  Cardiac:          Normal rate  Abd:                Nondistended, nontender  MSK:              Moves extremities without difficulty Neuro:            Speech clear,EOMS intact     Initiation of care has begun. The patient has been counseled on the process, plan, and necessity for staying for the completion/evaluation, and the remainder of the medical screening examination    Alfredia Client, PA-C 07/14/20 1455    Daleen Bo, MD 07/14/20 1756

## 2020-07-14 NOTE — ED Triage Notes (Signed)
Pt c/o chest pain x 3 weeks ago; pt was seen at cardiologist and told he has a vessel blockage; pt c/o numbness all over body

## 2020-07-21 DIAGNOSIS — K56600 Partial intestinal obstruction, unspecified as to cause: Secondary | ICD-10-CM | POA: Diagnosis not present

## 2020-07-21 DIAGNOSIS — I728 Aneurysm of other specified arteries: Secondary | ICD-10-CM | POA: Diagnosis not present

## 2020-07-21 DIAGNOSIS — Z9049 Acquired absence of other specified parts of digestive tract: Secondary | ICD-10-CM | POA: Diagnosis not present

## 2020-07-21 DIAGNOSIS — Z716 Tobacco abuse counseling: Secondary | ICD-10-CM | POA: Diagnosis not present

## 2020-07-21 DIAGNOSIS — M47817 Spondylosis without myelopathy or radiculopathy, lumbosacral region: Secondary | ICD-10-CM | POA: Diagnosis not present

## 2020-07-21 DIAGNOSIS — Z880 Allergy status to penicillin: Secondary | ICD-10-CM | POA: Diagnosis not present

## 2020-07-21 DIAGNOSIS — J449 Chronic obstructive pulmonary disease, unspecified: Secondary | ICD-10-CM | POA: Diagnosis not present

## 2020-07-21 DIAGNOSIS — Z720 Tobacco use: Secondary | ICD-10-CM | POA: Diagnosis not present

## 2020-07-21 DIAGNOSIS — K6389 Other specified diseases of intestine: Secondary | ICD-10-CM | POA: Diagnosis not present

## 2020-07-21 DIAGNOSIS — Z20822 Contact with and (suspected) exposure to covid-19: Secondary | ICD-10-CM | POA: Diagnosis not present

## 2020-07-21 DIAGNOSIS — F17200 Nicotine dependence, unspecified, uncomplicated: Secondary | ICD-10-CM | POA: Diagnosis not present

## 2020-07-21 DIAGNOSIS — Z4682 Encounter for fitting and adjustment of non-vascular catheter: Secondary | ICD-10-CM | POA: Diagnosis not present

## 2020-07-21 DIAGNOSIS — Z7982 Long term (current) use of aspirin: Secondary | ICD-10-CM | POA: Diagnosis not present

## 2020-07-21 DIAGNOSIS — K56609 Unspecified intestinal obstruction, unspecified as to partial versus complete obstruction: Secondary | ICD-10-CM | POA: Diagnosis not present

## 2020-07-21 DIAGNOSIS — R1084 Generalized abdominal pain: Secondary | ICD-10-CM | POA: Diagnosis not present

## 2020-07-21 DIAGNOSIS — Z7984 Long term (current) use of oral hypoglycemic drugs: Secondary | ICD-10-CM | POA: Diagnosis not present

## 2020-07-21 DIAGNOSIS — K3189 Other diseases of stomach and duodenum: Secondary | ICD-10-CM | POA: Diagnosis not present

## 2020-07-21 DIAGNOSIS — E119 Type 2 diabetes mellitus without complications: Secondary | ICD-10-CM | POA: Diagnosis not present

## 2020-07-21 DIAGNOSIS — Z981 Arthrodesis status: Secondary | ICD-10-CM | POA: Diagnosis not present

## 2020-07-21 DIAGNOSIS — G894 Chronic pain syndrome: Secondary | ICD-10-CM | POA: Diagnosis not present

## 2020-07-21 DIAGNOSIS — R1111 Vomiting without nausea: Secondary | ICD-10-CM | POA: Diagnosis not present

## 2020-07-21 DIAGNOSIS — E440 Moderate protein-calorie malnutrition: Secondary | ICD-10-CM | POA: Insufficient documentation

## 2020-07-21 DIAGNOSIS — R109 Unspecified abdominal pain: Secondary | ICD-10-CM | POA: Diagnosis not present

## 2020-07-28 DIAGNOSIS — G894 Chronic pain syndrome: Secondary | ICD-10-CM | POA: Diagnosis not present

## 2020-07-28 DIAGNOSIS — M1991 Primary osteoarthritis, unspecified site: Secondary | ICD-10-CM | POA: Diagnosis not present

## 2020-07-28 DIAGNOSIS — M6281 Muscle weakness (generalized): Secondary | ICD-10-CM | POA: Diagnosis not present

## 2020-07-28 DIAGNOSIS — E1140 Type 2 diabetes mellitus with diabetic neuropathy, unspecified: Secondary | ICD-10-CM | POA: Diagnosis not present

## 2020-07-31 NOTE — Progress Notes (Signed)
Referring Provider: [name] Primary Care Physician:  Raymond School, MD Primary Gastroenterologist:  Dr. Gala [name]  Chief Complaint  Patient presents with  . Abdominal Pain    Went to ED at [place] for constipation. Started soft diet for past 2 weeks. Ate chicken and bread last night-tolerated well.    HPI:   Raymond [name] is a 60 y.o. male presenting today at the request of [place] for abdominal pain and constipation.   EGD and colonoscopy 10/28/2012: EGD: Abnormal gastric mucosa of uncertain significance s/p biopsy, small hiatal hernia, duodenal lipoma.  Pathology with mild chronic gastritis, negative for H. pylori or malignancy. Colonoscopy: 7 mm rectal polyp removed, anal papilla, internal hemorrhoids.  Pathology revealed hyperplastic polyp.  Repeat colonoscopy in 10 years.  Patient was briefly admitted 5/13 - 07/22/2020 at [facility] with abdominal pain, constipation, and vomiting.  CT revealed significant debris's within the stomach, significant antral wall thickening, mildly prominent loops of small bowel in the left central abdomen without transition zone, significant stool throughout the colon.  He was treated supportively with improvement in symptoms.  At discharge, recommended bowel regimen of 64 ounces of water daily, 20 to 30 g of fiber daily, 500 mg of magnesium daily.  Today: [name] emergency room a couple weeks ago for abdominal pain.  He was having problems with his bowels.  He was kept overnight and they got his bowels moving.  Since that time he has been on a liquid and/or soft diet.  Previously tried Metamucil, but this was unhelpful for bowel movements.  Not currently using anything to help with bowel regularity and bowels are moving every day, soft and formed.  Typically with 1 BM per day.  Drinking a lot of water, apple juice, and eating applesauce. Prior to the hospital, had been 2 weeks since having a bowel movement. Prior to this, bowels would move every few  days. Stools were hard and had to strain. No blood in the stool. No black stool.  Abdominal pain resolved since hospitalization.  No nausea or vomiting since presenting to the hospital.  [name] was the first that he tried regular foods (rotisserie chicken and Hawaiian rolls) and tolerated this fine.  Takes 2 percocet every 6 hours for back and hip pain.   GERD is well controled on omeprazole 40 mg daily. No dysphagia.   81 mg aspirin.  Alka-Seltzer about once a week. Diclofenac TID.    Requires a walker. Trouble with his hands. Has an MRI scheduled soon.   Past Medical History:  Diagnosis Date  . Anxiety   . Back pain   . Bipolar disorder ([place])   . Borderline hypertension   . Chronic pain   . COPD (chronic obstructive pulmonary disease) ([place])   . Depression with anxiety   . GERD (gastroesophageal reflux disease)   . Headache   . Hip pain   . Hypercholesterolemia   . Stroke ([place])   . Tobacco use     Past Surgical History:  Procedure Laterality Date  . ANTERIOR FUSION CERVICAL SPINE    . BACK SURGERY    . CHOLECYSTECTOMY    . COLONOSCOPY N/A 10/28/2012   CVE:LFYBOF polyp-hyperplastic. Internal hemorrhoids. Distal 5 cm of terminal ileum appeared normal.  . CORONARY ANGIOPLASTY WITH STENT PLACEMENT  1980s  . ESOPHAGOGASTRODUODENOSCOPY N/A 10/28/2012   BPZ:WCHENIDP gastric mucosa with mottling and submucosal petechiae. Mild chronic gastritis but no H. pylori home path. Small hiatal hernia. Duodenal lipoma  . EYE SURGERY  Left eye   as a child  . LUMBAR SPINE SURGERY     3 x     Current Outpatient Medications  Medication Sig Dispense Refill  . albuterol (PROVENTIL HFA;VENTOLIN HFA) 108 (90 BASE) MCG/ACT inhaler Inhale 2 puffs into the lungs every 6 (six) hours as needed for wheezing.    [name] albuterol (PROVENTIL) (2.5 MG/3ML) 0.083% nebulizer solution as needed.    [name] allopurinol (ZYLOPRIM) 100 MG tablet Take 100 mg by mouth daily.     [name] aspirin 81 MG tablet Take 81 mg by  mouth daily.    [name] buPROPion (WELLBUTRIN XL) 150 MG 24 hr tablet Take 1 tablet by mouth daily at 6 (six) AM.    . diclofenac (VOLTAREN) 50 MG EC tablet Take 50 mg by mouth 3 (three) times daily.    [name] gabapentin (NEURONTIN) 300 MG capsule Take 300 mg by mouth 3 (three) times daily.    . metFORMIN (GLUCOPHAGE) 500 MG tablet Take 2 tablets by mouth 2 (two) times daily.    . metoprolol tartrate (LOPRESSOR) 25 MG tablet Take 0.5 tablets (12.5 mg total) by mouth 2 (two) times daily. 90 tablet 1  . omeprazole (PRILOSEC) 40 MG capsule Take 1 capsule by mouth daily at 6 (six) AM.    . oxyCODONE-acetaminophen (PERCOCET) 10-325 MG per tablet Take 1-2 tablets by mouth every 6 (six) hours as needed for pain.     . rosuvastatin (CRESTOR) 10 MG tablet Take 10 mg by mouth daily.    . tamsulosin (FLOMAX) 0.4 MG CAPS capsule Take 0.4 mg by mouth daily after supper.     No current facility-administered medications for this visit.    Allergies as of 08/02/2020 - Review Complete 08/02/2020  Allergen Reaction Noted  . Penicillins Anaphylaxis 07/09/2010  . Latex  10/19/2012  . Morphine and related  10/15/2012  . Vicodin [hydrocodone-acetaminophen]  10/15/2012    Family History  Problem Relation Age of Onset  . Stomach cancer Mother   . Throat cancer Father   . Colon cancer Neg Hx     Social History   Socioeconomic History  . Marital status: Married    Spouse name: Not on file  . Number of children: 4  . Years of education: 40  . Highest education level: Not on file  Occupational History  . Occupation: Disabled  Tobacco Use  . Smoking status: Current Every Day Smoker    Packs/day: 2.00    Years: 30.00    Pack years: 60.00    Types: Cigarettes  . Smokeless tobacco: Never Used  Substance and Sexual Activity  . Alcohol use: No    Alcohol/week: 0.0 standard drinks    Comment: history of ETOH abuse in past, none in 4 years  . Drug use: No  . Sexual activity: Not Currently  Other Topics Concern   . Not on file  Social History Narrative   Lives at home alone.   12 pack of [place] Dew per day.   Right-handed.   Social Determinants of Health   Financial Resource Strain: Not on file  Food Insecurity: Not on file  Transportation Needs: Not on file  Physical Activity: Not on file  Stress: Not on file  Social Connections: Not on file  Intimate Partner Violence: Not on file    Review of Systems: Gen: Denies any fever, chills, cold or flulike symptoms, presyncope, syncope.  CV: Denies chest pain or heart palpitations Resp: Chronic SOB. Uses O2. 1-2L [place] a couple hours  a day. Doesn't sleep with O2.   GI: See HPI GU : See HPI MS: Chronic back pain. Derm: Denies rash Psych: Denies depression or anxiety Heme: See HPI  Physical Exam: BP 99/65   Pulse (!) 114   Temp (!) 96.9 F (36.1 C) (Temporal)   Ht 5\' 8"  (1.727 m)   Wt 154 lb 6.4 oz (70 kg)   BMI 23.48 kg/m  General:   Alert and oriented. Pleasant and cooperative. Well-nourished and well-developed.  In wheelchair. Head:  Normocephalic and atraumatic. Eyes:  Without icterus, sclera clear and conjunctiva pink.  Ears:  Normal auditory acuity. Lungs:  Clear to auscultation bilaterally.  Few wheezes in the right lower lung field.  No rales, or rhonchi. No distress.  Heart:  S1, S2 present without murmurs appreciated.  Abdomen:  +BS, soft, non-tender and non-distended. No HSM noted. No guarding or rebound. No masses appreciated.  Rectal:  Deferred  Msk:  Symmetrical without gross deformities. Normal posture. Extremities:  Without edema. Neurologic:  Alert and  oriented x4;  grossly normal neurologically. Skin:  Intact without significant lesions or rashes. Psych: Normal mood and affect.   Assessment: 60 year old male presenting today at the request of primary care provider for further evaluation of abdominal pain and constipation.  [facility] was [facility] - 07/22/2020 with abdominal pain,  constipation, and vomiting.  CT revealed significant debris's within the stomach, significant antral wall thickening, mildly prominent loops of small bowel in the left central abdomen without transition zone, significant stool throughout the colon.  He was treated supportively with improvement in symptoms.   Since hospital discharge, patient has continued following a liquid/soft diet with resolution of abdominal pain.  No further nausea or vomiting.  Bowels are moving daily and are soft and formed.  Not currently taking anything for bowel regularity.  Reports at baseline, prior to hospitalization, bowels moved every couple of days, were hard, and required straining.  He had gone about 2 weeks with a BM prior to hospitalization.  He is on chronic pain medications daily which is likely the etiology of his chronic constipation.  Colonoscopy is up-to-date in 2014, due for repeat in 2024.  As his bowels are moving well at this time, will not start any medications today.  He was advised to slowly advance his diet, avoid tough textured items such as beef and pork, use Colace 100-200 mg daily if his stools start to firm up with advancing his diet, and call if Colace is not helping.  We would provide samples of Linzess at that time.  Gastric wall thickening: Significant antral wall thickening noted on CT 07/21/2020.  Chronic history of GERD well-controlled on omeprazole 40 mg daily.  Notably, he is taking quite a bit of NSAIDs including diclofenac 3 times daily and occasional Alka-Seltzer.  Last EGD August 2014 with abnormal gastric mucosa biopsied revealing mild chronic gastritis, negative for H. pylori or malignancy.  Family history significant for mother with stomach cancer.  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  He will call us back when he is ready  to schedule.  Advised to limit diclofenac and Alka-Seltzer/as possible and avoid all other NSAIDs.  GERD: Well-controlled on omeprazole 40 mg daily.  No alarm symptoms.   Plan: 1.  Advance to a regular diet  slowly over the next 1 to 2 weeks. 2.  Avoid tough textures such as beef and pork and start with consuming small amounts of meats at a time such as poultry or fish.  3.  Drink at least 64 ounces of water daily. 4.  Hold off on starting medications for constipation as bowels are moving well. 5.  Advised to use Colace 100-200 mg nightly if stool starts to become more firm when advancing his diet. 6.  If Colace is not helpful, requested he give our office a call and we can provide samples of Linzess. 7.  Continue omeprazole 40 mg daily 30 minutes before breakfast. 8.  Limit diclofenac and Alka-Seltzer as much as possible.  Avoid all other NSAIDs. 9.  Advised to call our office when he is ready to schedule EGD for evaluation of gastric wall thickening. 10.  Follow-up in 4 months or sooner if needed.    Aliene Altes, PA-C [facility] 08/02/2020

## 2020-08-02 ENCOUNTER — Ambulatory Visit (INDEPENDENT_AMBULATORY_CARE_PROVIDER_SITE_OTHER): Payer: Medicare HMO | Admitting: Gastroenterology

## 2020-08-02 ENCOUNTER — Encounter: Payer: Self-pay | Admitting: Gastroenterology

## 2020-08-02 VITALS — BP 99/65 | HR 114 | Temp 96.9°F | Ht 68.0 in | Wt 154.4 lb

## 2020-08-02 DIAGNOSIS — K219 Gastro-esophageal reflux disease without esophagitis: Secondary | ICD-10-CM

## 2020-08-02 DIAGNOSIS — K5903 Drug induced constipation: Secondary | ICD-10-CM

## 2020-08-02 DIAGNOSIS — K3189 Other diseases of stomach and duodenum: Secondary | ICD-10-CM

## 2020-08-02 NOTE — Patient Instructions (Signed)
As we discussed, continue to slowly advance back to a regular diet.  - Avoid tough textured foods such as beef and pork.  Chicken and fish should be okay.  Recommend starting with small amounts and increasing slowly.  - Continue drinking at least 64 ounces of water daily. - As your bowels are moving well every day, I do not feel we need to start you on any medication for constipation. - If your stools start to firm up when you advance her diet, you may use Colace 100-200 mg nightly. - If Colace is not helpful, please give me a call and we will provide samples for you to try.  Continue taking omeprazole 40 mg daily 30 minutes before breakfast for acid reflux.  Please try to limit diclofenac and Alka-Seltzer as much as possible as this medication is in the NSAID category and will contribute to inflammation in your GI tract.   Recommend avoiding all other NSAIDs including ibuprofen, Aleve, Advil, Goody powders, BC powders, naproxen, and anything that says "NSAID" on the package.  When you are ready to schedule your upper endoscopy to evaluate gastric wall thickening noted on your CT scan during your recent hospitalization, please call our office and let us know.   We will plan to see back in 4 months.  Call sooner if you have any questions or concerns prior.   It was good to meet you today!   Aliene Altes, PA-C [facility]

## 2020-08-08 DIAGNOSIS — I10 Essential (primary) hypertension: Secondary | ICD-10-CM | POA: Diagnosis not present

## 2020-08-08 DIAGNOSIS — E1165 Type 2 diabetes mellitus with hyperglycemia: Secondary | ICD-10-CM | POA: Diagnosis not present

## 2020-08-31 DIAGNOSIS — M5136 Other intervertebral disc degeneration, lumbar region: Secondary | ICD-10-CM | POA: Diagnosis not present

## 2020-08-31 DIAGNOSIS — M5030 Other cervical disc degeneration, unspecified cervical region: Secondary | ICD-10-CM | POA: Diagnosis not present

## 2020-08-31 DIAGNOSIS — M6281 Muscle weakness (generalized): Secondary | ICD-10-CM | POA: Diagnosis not present

## 2020-08-31 DIAGNOSIS — G894 Chronic pain syndrome: Secondary | ICD-10-CM | POA: Diagnosis not present

## 2020-09-07 DIAGNOSIS — E1165 Type 2 diabetes mellitus with hyperglycemia: Secondary | ICD-10-CM | POA: Diagnosis not present

## 2020-09-07 DIAGNOSIS — I10 Essential (primary) hypertension: Secondary | ICD-10-CM | POA: Diagnosis not present

## 2020-09-18 ENCOUNTER — Other Ambulatory Visit (HOSPITAL_COMMUNITY): Payer: Self-pay | Admitting: Physician Assistant

## 2020-09-18 DIAGNOSIS — M79645 Pain in left finger(s): Secondary | ICD-10-CM | POA: Diagnosis not present

## 2020-09-18 DIAGNOSIS — Z681 Body mass index (BMI) 19 or less, adult: Secondary | ICD-10-CM | POA: Diagnosis not present

## 2020-09-18 DIAGNOSIS — R110 Nausea: Secondary | ICD-10-CM | POA: Diagnosis not present

## 2020-09-18 DIAGNOSIS — R2232 Localized swelling, mass and lump, left upper limb: Secondary | ICD-10-CM | POA: Diagnosis not present

## 2020-09-18 DIAGNOSIS — E441 Mild protein-calorie malnutrition: Secondary | ICD-10-CM | POA: Diagnosis not present

## 2020-09-18 DIAGNOSIS — Z993 Dependence on wheelchair: Secondary | ICD-10-CM | POA: Diagnosis not present

## 2020-09-18 DIAGNOSIS — M4710 Other spondylosis with myelopathy, site unspecified: Secondary | ICD-10-CM | POA: Diagnosis not present

## 2020-10-05 DIAGNOSIS — G894 Chronic pain syndrome: Secondary | ICD-10-CM | POA: Diagnosis not present

## 2020-10-08 DIAGNOSIS — E1165 Type 2 diabetes mellitus with hyperglycemia: Secondary | ICD-10-CM | POA: Diagnosis not present

## 2020-10-08 DIAGNOSIS — I10 Essential (primary) hypertension: Secondary | ICD-10-CM | POA: Diagnosis not present

## 2020-11-03 DIAGNOSIS — J449 Chronic obstructive pulmonary disease, unspecified: Secondary | ICD-10-CM | POA: Diagnosis not present

## 2020-11-03 DIAGNOSIS — I10 Essential (primary) hypertension: Secondary | ICD-10-CM | POA: Diagnosis not present

## 2020-11-03 DIAGNOSIS — G894 Chronic pain syndrome: Secondary | ICD-10-CM | POA: Diagnosis not present

## 2020-11-08 DIAGNOSIS — E1165 Type 2 diabetes mellitus with hyperglycemia: Secondary | ICD-10-CM | POA: Diagnosis not present

## 2020-11-08 DIAGNOSIS — I10 Essential (primary) hypertension: Secondary | ICD-10-CM | POA: Diagnosis not present

## 2020-11-16 ENCOUNTER — Other Ambulatory Visit: Payer: Self-pay | Admitting: Cardiology

## 2020-11-21 DIAGNOSIS — Z6822 Body mass index (BMI) 22.0-22.9, adult: Secondary | ICD-10-CM | POA: Diagnosis not present

## 2020-11-21 DIAGNOSIS — R34 Anuria and oliguria: Secondary | ICD-10-CM | POA: Diagnosis not present

## 2020-11-21 DIAGNOSIS — G959 Disease of spinal cord, unspecified: Secondary | ICD-10-CM | POA: Diagnosis not present

## 2020-11-21 DIAGNOSIS — L8990 Pressure ulcer of unspecified site, unspecified stage: Secondary | ICD-10-CM | POA: Diagnosis not present

## 2020-11-21 DIAGNOSIS — Z125 Encounter for screening for malignant neoplasm of prostate: Secondary | ICD-10-CM | POA: Diagnosis not present

## 2020-12-05 ENCOUNTER — Emergency Department (HOSPITAL_COMMUNITY): Payer: Medicare HMO

## 2020-12-05 ENCOUNTER — Encounter (HOSPITAL_COMMUNITY): Payer: Self-pay

## 2020-12-05 ENCOUNTER — Other Ambulatory Visit: Payer: Self-pay

## 2020-12-05 ENCOUNTER — Inpatient Hospital Stay (HOSPITAL_COMMUNITY)
Admission: EM | Admit: 2020-12-05 | Discharge: 2020-12-13 | DRG: 271 | Disposition: A | Discharge status: Home Health | Payer: Medicare HMO | Attending: Internal Medicine | Admitting: Internal Medicine

## 2020-12-05 DIAGNOSIS — F319 Bipolar disorder, unspecified: Secondary | ICD-10-CM | POA: Diagnosis present

## 2020-12-05 DIAGNOSIS — E785 Hyperlipidemia, unspecified: Secondary | ICD-10-CM | POA: Diagnosis present

## 2020-12-05 DIAGNOSIS — I745 Embolism and thrombosis of iliac artery: Secondary | ICD-10-CM | POA: Diagnosis present

## 2020-12-05 DIAGNOSIS — Q272 Other congenital malformations of renal artery: Secondary | ICD-10-CM | POA: Diagnosis not present

## 2020-12-05 DIAGNOSIS — Z720 Tobacco use: Secondary | ICD-10-CM | POA: Diagnosis present

## 2020-12-05 DIAGNOSIS — Z7982 Long term (current) use of aspirin: Secondary | ICD-10-CM | POA: Diagnosis not present

## 2020-12-05 DIAGNOSIS — F17210 Nicotine dependence, cigarettes, uncomplicated: Secondary | ICD-10-CM | POA: Diagnosis present

## 2020-12-05 DIAGNOSIS — I96 Gangrene, not elsewhere classified: Secondary | ICD-10-CM | POA: Diagnosis present

## 2020-12-05 DIAGNOSIS — Z7984 Long term (current) use of oral hypoglycemic drugs: Secondary | ICD-10-CM

## 2020-12-05 DIAGNOSIS — Z888 Allergy status to other drugs, medicaments and biological substances status: Secondary | ICD-10-CM

## 2020-12-05 DIAGNOSIS — E7800 Pure hypercholesterolemia, unspecified: Secondary | ICD-10-CM | POA: Diagnosis present

## 2020-12-05 DIAGNOSIS — E119 Type 2 diabetes mellitus without complications: Secondary | ICD-10-CM

## 2020-12-05 DIAGNOSIS — I70261 Atherosclerosis of native arteries of extremities with gangrene, right leg: Secondary | ICD-10-CM | POA: Diagnosis present

## 2020-12-05 DIAGNOSIS — Z8673 Personal history of transient ischemic attack (TIA), and cerebral infarction without residual deficits: Secondary | ICD-10-CM

## 2020-12-05 DIAGNOSIS — Z955 Presence of coronary angioplasty implant and graft: Secondary | ICD-10-CM

## 2020-12-05 DIAGNOSIS — Z87891 Personal history of nicotine dependence: Secondary | ICD-10-CM | POA: Diagnosis not present

## 2020-12-05 DIAGNOSIS — Z20822 Contact with and (suspected) exposure to covid-19: Secondary | ICD-10-CM | POA: Diagnosis present

## 2020-12-05 DIAGNOSIS — Z9181 History of falling: Secondary | ICD-10-CM

## 2020-12-05 DIAGNOSIS — Z79899 Other long term (current) drug therapy: Secondary | ICD-10-CM | POA: Diagnosis not present

## 2020-12-05 DIAGNOSIS — E1140 Type 2 diabetes mellitus with diabetic neuropathy, unspecified: Secondary | ICD-10-CM | POA: Diagnosis present

## 2020-12-05 DIAGNOSIS — E11649 Type 2 diabetes mellitus with hypoglycemia without coma: Secondary | ICD-10-CM | POA: Diagnosis not present

## 2020-12-05 DIAGNOSIS — R262 Difficulty in walking, not elsewhere classified: Secondary | ICD-10-CM | POA: Diagnosis present

## 2020-12-05 DIAGNOSIS — J449 Chronic obstructive pulmonary disease, unspecified: Secondary | ICD-10-CM | POA: Diagnosis present

## 2020-12-05 DIAGNOSIS — Z9981 Dependence on supplemental oxygen: Secondary | ICD-10-CM

## 2020-12-05 DIAGNOSIS — Z96641 Presence of right artificial hip joint: Secondary | ICD-10-CM | POA: Diagnosis present

## 2020-12-05 DIAGNOSIS — M109 Gout, unspecified: Secondary | ICD-10-CM | POA: Diagnosis present

## 2020-12-05 DIAGNOSIS — Z981 Arthrodesis status: Secondary | ICD-10-CM

## 2020-12-05 DIAGNOSIS — G894 Chronic pain syndrome: Secondary | ICD-10-CM | POA: Diagnosis present

## 2020-12-05 DIAGNOSIS — E538 Deficiency of other specified B group vitamins: Secondary | ICD-10-CM | POA: Diagnosis present

## 2020-12-05 DIAGNOSIS — Z808 Family history of malignant neoplasm of other organs or systems: Secondary | ICD-10-CM

## 2020-12-05 DIAGNOSIS — K219 Gastro-esophageal reflux disease without esophagitis: Secondary | ICD-10-CM | POA: Diagnosis present

## 2020-12-05 DIAGNOSIS — E1165 Type 2 diabetes mellitus with hyperglycemia: Secondary | ICD-10-CM | POA: Diagnosis present

## 2020-12-05 DIAGNOSIS — Z8249 Family history of ischemic heart disease and other diseases of the circulatory system: Secondary | ICD-10-CM

## 2020-12-05 DIAGNOSIS — Z91040 Latex allergy status: Secondary | ICD-10-CM

## 2020-12-05 DIAGNOSIS — L97518 Non-pressure chronic ulcer of other part of right foot with other specified severity: Secondary | ICD-10-CM | POA: Diagnosis present

## 2020-12-05 DIAGNOSIS — I708 Atherosclerosis of other arteries: Secondary | ICD-10-CM | POA: Diagnosis present

## 2020-12-05 DIAGNOSIS — D508 Other iron deficiency anemias: Secondary | ICD-10-CM | POA: Diagnosis not present

## 2020-12-05 DIAGNOSIS — W19XXXA Unspecified fall, initial encounter: Secondary | ICD-10-CM | POA: Diagnosis present

## 2020-12-05 DIAGNOSIS — L98499 Non-pressure chronic ulcer of skin of other sites with unspecified severity: Secondary | ICD-10-CM | POA: Diagnosis present

## 2020-12-05 DIAGNOSIS — E11621 Type 2 diabetes mellitus with foot ulcer: Secondary | ICD-10-CM | POA: Diagnosis present

## 2020-12-05 DIAGNOSIS — J441 Chronic obstructive pulmonary disease with (acute) exacerbation: Secondary | ICD-10-CM | POA: Diagnosis not present

## 2020-12-05 DIAGNOSIS — E1152 Type 2 diabetes mellitus with diabetic peripheral angiopathy with gangrene: Secondary | ICD-10-CM | POA: Diagnosis present

## 2020-12-05 DIAGNOSIS — R9431 Abnormal electrocardiogram [ECG] [EKG]: Secondary | ICD-10-CM | POA: Diagnosis not present

## 2020-12-05 DIAGNOSIS — I70231 Atherosclerosis of native arteries of right leg with ulceration of thigh: Secondary | ICD-10-CM | POA: Diagnosis not present

## 2020-12-05 DIAGNOSIS — E440 Moderate protein-calorie malnutrition: Secondary | ICD-10-CM | POA: Diagnosis not present

## 2020-12-05 DIAGNOSIS — I739 Peripheral vascular disease, unspecified: Secondary | ICD-10-CM | POA: Diagnosis not present

## 2020-12-05 DIAGNOSIS — J9610 Chronic respiratory failure, unspecified whether with hypoxia or hypercapnia: Secondary | ICD-10-CM | POA: Diagnosis present

## 2020-12-05 DIAGNOSIS — F32A Depression, unspecified: Secondary | ICD-10-CM | POA: Diagnosis not present

## 2020-12-05 DIAGNOSIS — I743 Embolism and thrombosis of arteries of the lower extremities: Secondary | ICD-10-CM | POA: Diagnosis not present

## 2020-12-05 DIAGNOSIS — L97909 Non-pressure chronic ulcer of unspecified part of unspecified lower leg with unspecified severity: Secondary | ICD-10-CM | POA: Diagnosis present

## 2020-12-05 DIAGNOSIS — Z885 Allergy status to narcotic agent status: Secondary | ICD-10-CM

## 2020-12-05 DIAGNOSIS — M47816 Spondylosis without myelopathy or radiculopathy, lumbar region: Secondary | ICD-10-CM | POA: Diagnosis not present

## 2020-12-05 DIAGNOSIS — R0781 Pleurodynia: Secondary | ICD-10-CM | POA: Diagnosis not present

## 2020-12-05 DIAGNOSIS — E8809 Other disorders of plasma-protein metabolism, not elsewhere classified: Secondary | ICD-10-CM | POA: Diagnosis not present

## 2020-12-05 DIAGNOSIS — I10 Essential (primary) hypertension: Secondary | ICD-10-CM | POA: Diagnosis present

## 2020-12-05 DIAGNOSIS — F419 Anxiety disorder, unspecified: Secondary | ICD-10-CM | POA: Diagnosis present

## 2020-12-05 DIAGNOSIS — Z880 Allergy status to penicillin: Secondary | ICD-10-CM

## 2020-12-05 DIAGNOSIS — I70209 Unspecified atherosclerosis of native arteries of extremities, unspecified extremity: Secondary | ICD-10-CM | POA: Diagnosis present

## 2020-12-05 DIAGNOSIS — Z9049 Acquired absence of other specified parts of digestive tract: Secondary | ICD-10-CM

## 2020-12-05 DIAGNOSIS — E876 Hypokalemia: Secondary | ICD-10-CM | POA: Diagnosis not present

## 2020-12-05 DIAGNOSIS — I70229 Atherosclerosis of native arteries of extremities with rest pain, unspecified extremity: Secondary | ICD-10-CM | POA: Diagnosis not present

## 2020-12-05 DIAGNOSIS — L819 Disorder of pigmentation, unspecified: Secondary | ICD-10-CM | POA: Diagnosis not present

## 2020-12-05 DIAGNOSIS — Z800 Family history of malignant neoplasm of digestive organs: Secondary | ICD-10-CM

## 2020-12-05 LAB — CBC WITH DIFFERENTIAL/PLATELET
Abs Immature Granulocytes: 0.09 10*3/uL — ABNORMAL HIGH (ref 0.00–0.07)
Basophils Absolute: 0.1 10*3/uL (ref 0.0–0.1)
Basophils Relative: 0 %
Eosinophils Absolute: 0 10*3/uL (ref 0.0–0.5)
Eosinophils Relative: 0 %
HCT: 35 % — ABNORMAL LOW (ref 39.0–52.0)
Hemoglobin: 11.4 g/dL — ABNORMAL LOW (ref 13.0–17.0)
Immature Granulocytes: 1 %
Lymphocytes Relative: 21 %
Lymphs Abs: 3.7 10*3/uL (ref 0.7–4.0)
MCH: 27.4 pg (ref 26.0–34.0)
MCHC: 32.6 g/dL (ref 30.0–36.0)
MCV: 84.1 fL (ref 80.0–100.0)
Monocytes Absolute: 1 10*3/uL (ref 0.1–1.0)
Monocytes Relative: 6 %
Neutro Abs: 12.8 10*3/uL — ABNORMAL HIGH (ref 1.7–7.7)
Neutrophils Relative %: 72 %
Platelets: 428 10*3/uL — ABNORMAL HIGH (ref 150–400)
RBC: 4.16 MIL/uL — ABNORMAL LOW (ref 4.22–5.81)
RDW: 17.2 % — ABNORMAL HIGH (ref 11.5–15.5)
WBC: 17.7 10*3/uL — ABNORMAL HIGH (ref 4.0–10.5)
nRBC: 0 % (ref 0.0–0.2)

## 2020-12-05 MED ORDER — SODIUM CHLORIDE 0.9 % IV BOLUS
1000.0000 mL | Freq: Once | INTRAVENOUS | Status: AC
  Administered 2020-12-05: 1000 mL via INTRAVENOUS

## 2020-12-05 MED ORDER — FENTANYL CITRATE PF 50 MCG/ML IJ SOSY
50.0000 ug | PREFILLED_SYRINGE | Freq: Once | INTRAMUSCULAR | Status: AC
Start: 2020-12-05 — End: 2020-12-05
  Administered 2020-12-05: 50 ug via INTRAVENOUS
  Filled 2020-12-05: qty 1

## 2020-12-05 NOTE — ED Provider Notes (Signed)
[facility] EMERGENCY DEPARTMENT Provider Note   CSN: [id number] Arrival date & time: 12/05/20  2140     History Chief Complaint  Patient presents with   Fall   Wound Check    Wound check-toes on right foot    COHAN STIPES is a 60 y.o. male.  60 year old male who presents to the emergency room today with a couple weeks of right foot pain.  Patient and his daughter give history.  It sounds that the patient initially started with some pain and what looked like an ulcer on his right fifth digit.  This progressively worsened and he became more painful.  He then developed a dark area to his right lateral foot that also became more painful and is now necrotic looking as well.  Has not done anything else for the symptoms.  Has not seen one else for the symptoms.  He also had a fall today because the pain in his foot landed on his left side and has some rib pain   Fall  Wound Check      Past Medical History:  Diagnosis Date   Anxiety    Back pain    Bipolar disorder ([place])    Borderline hypertension    Chronic pain    COPD (chronic obstructive pulmonary disease) (HCC)    Depression with anxiety    GERD (gastroesophageal reflux disease)    Headache    Hip pain    Hypercholesterolemia    Stroke [facility])    Tobacco use     Patient Active Problem List   Diagnosis Date Noted   Gastric wall thickening 08/02/2020   Drug-induced constipation 08/02/2020   COPD with acute exacerbation ([place]) 06/22/2017   Hyperglycemia 06/22/2017   Anxiety 06/22/2017   Type 2 diabetes mellitus without complication ([place]) 27/05/5007   COPD exacerbation ([place]) 06/22/2017   CVA (cerebral infarction) 11/10/2013   OA (osteoarthritis) of knee 02/24/2013   Chronic pain syndrome 02/24/2013   Spinal stenosis, lumbar region, with neurogenic claudication 02/24/2013   Gastritis 11/11/2012   Abnormal weight loss 11/11/2012   Gastroesophageal reflux disease 10/15/2012   Abdominal pain, other specified site  10/15/2012    Past Surgical History:  Procedure Laterality Date   ANTERIOR FUSION CERVICAL SPINE     BACK SURGERY     CHOLECYSTECTOMY     COLONOSCOPY N/A 10/28/2012   [facility]:WEXHBZ polyp-hyperplastic. Internal hemorrhoids. Distal 5 cm of terminal ileum appeared normal.   CORONARY ANGIOPLASTY WITH STENT PLACEMENT  1980s   ESOPHAGOGASTRODUODENOSCOPY N/A 10/28/2012   JIR:CVELFYBO gastric mucosa with mottling and submucosal petechiae. Mild chronic gastritis but no H. pylori home path. Small hiatal hernia. Duodenal lipoma   EYE SURGERY  Left eye   as a child   LUMBAR SPINE SURGERY     3 x        Family History  Problem Relation Age of Onset   Stomach cancer Mother    Throat cancer Father    Colon cancer Neg Hx     Social History   Tobacco Use   Smoking status: Every Day    Packs/day: 2.00    Years: 30.00    Pack years: 60.00    Types: Cigarettes   Smokeless tobacco: Never  Substance Use Topics   Alcohol use: No    Alcohol/week: 0.0 standard drinks    Comment: history of ETOH abuse in past, none in 4 years   Drug use: No    Home Medications Prior to Admission medications  Medication Sig Start Date End Date Taking? Authorizing Provider  albuterol (PROVENTIL HFA;VENTOLIN HFA) 108 (90 BASE) MCG/ACT inhaler Inhale 2 puffs into the lungs every 6 (six) hours as needed for wheezing.    [provider]  albuterol (PROVENTIL) (2.5 MG/3ML) 0.083% nebulizer solution as needed. 10/13/12   [provider]  allopurinol (ZYLOPRIM) 100 MG tablet Take 100 mg by mouth daily.  10/13/12   [provider]  aspirin 81 MG tablet Take 81 mg by mouth daily.    [provider]  buPROPion (WELLBUTRIN XL) 150 MG 24 hr tablet Take 1 tablet by mouth daily at 6 (six) AM. 05/09/20   [provider]  diclofenac (VOLTAREN) 50 MG EC tablet Take 50 mg by mouth 3 (three) times daily. 02/07/20   [provider]  gabapentin (NEURONTIN) 300 MG capsule Take 300 mg by  mouth 3 (three) times daily.    [provider]  metFORMIN (GLUCOPHAGE) 500 MG tablet Take 2 tablets by mouth 2 (two) times daily.    [provider]  metoprolol tartrate (LOPRESSOR) 25 MG tablet TAKE 1/2 TABLET BY MOUTH TWICE DAILY 11/16/20   Arnoldo Lenis, MD  omeprazole (PRILOSEC) 40 MG capsule Take 1 capsule by mouth daily at 6 (six) AM. 05/09/20   [provider]  oxyCODONE-acetaminophen (PERCOCET) 10-325 MG per tablet Take 1-2 tablets by mouth every 6 (six) hours as needed for pain.  09/24/12   [provider]  rosuvastatin (CRESTOR) 10 MG tablet Take 10 mg by mouth daily. 12/31/19   [provider]  tamsulosin (FLOMAX) 0.4 MG CAPS capsule Take 0.4 mg by mouth daily after supper.    [provider]    Allergies    Penicillins, Latex, Morphine and related, and Vicodin [hydrocodone-acetaminophen]  Review of Systems   Review of Systems  Physical Exam Updated Vital Signs BP 127/75   Pulse 88   Temp 98.3 F (36.8 C) (Oral)   Resp 18   Ht 5\' 8"  (1.727 m)   Wt 64 kg   SpO2 94%   BMI 21.44 kg/m   Physical Exam  ED Results / Procedures / Treatments   Labs (all labs ordered are listed, but only abnormal results are displayed) Labs Reviewed  CBC WITH DIFFERENTIAL/PLATELET - Abnormal; Notable for the following components:      Result Value   WBC 17.7 (*)    RBC 4.16 (*)    Hemoglobin 11.4 (*)    HCT 35.0 (*)    RDW 17.2 (*)    Platelets 428 (*)    Neutro Abs 12.8 (*)    Abs Immature Granulocytes 0.09 (*)    All other components within normal limits  COMPREHENSIVE METABOLIC PANEL - Abnormal; Notable for the following components:   Sodium 132 (*)    Glucose, Bld 104 (*)    Creatinine, Ser 0.43 (*)    Calcium 7.7 (*)    Total Protein 5.4 (*)    Albumin 1.9 (*)    AST 12 (*)    Alkaline Phosphatase 146 (*)    Total Bilirubin 0.2 (*)    All other components within normal limits  SEDIMENTATION RATE - Abnormal; Notable  for the following components:   Sed Rate 52 (*)    All other components within normal limits  C-REACTIVE PROTEIN - Abnormal; Notable for the following components:   CRP 13.5 (*)    All other components within normal limits  RESP PANEL BY RT-PCR (FLU A&B, COVID) ARPGX2  LACTIC ACID, PLASMA  LACTIC ACID, PLASMA  HEPARIN LEVEL (UNFRACTIONATED)    EKG EKG Interpretation  Date/Time:  Wednesday December 06 2020 01:11:14 EDT Ventricular Rate:  93 PR Interval:  208 QRS Duration: 81 QT Interval:  374 QTC Calculation: 466 R Axis:   43 Text Interpretation: Sinus rhythm Prolonged PR interval Low voltage, extremity leads Confirmed by Merrily Pew [phone]) on 12/06/2020 1:16:52 AM  Radiology DG Ribs Unilateral W/Chest Left  Result Date: 12/06/2020 CLINICAL DATA:  Fall, left rib pain EXAM: LEFT RIBS AND CHEST - 3+ VIEW COMPARISON:  CT chest dated 09/02/2013 FINDINGS: Lungs are clear.  No pleural effusion or pneumothorax. The heart is normal in size. No displaced left rib fracture is seen. Cervical spine fixation hardware. IMPRESSION: No displaced left rib fracture is seen. No evidence of acute cardiopulmonary disease. Electronically Signed   By: Julian Hy M.D.   On: 12/06/2020 00:30   CT ANGIO AO+BIFEM W & OR WO CONTRAST  Result Date: 12/06/2020 CLINICAL DATA:  Right fourth and 5th toe gangrene, Arterial embolism, lower extremity EXAM: CT ANGIOGRAPHY OF ABDOMINAL AORTA WITH ILIOFEMORAL RUNOFF TECHNIQUE: Multidetector CT imaging of the abdomen, pelvis and lower extremities was performed using the standard protocol during bolus administration of intravenous contrast. Multiplanar CT image reconstructions and MIPs were obtained to evaluate the vascular anatomy. CONTRAST:  143mL OMNIPAQUE IOHEXOL 350 MG/ML SOLN COMPARISON:  CTA abdomen pelvis 10/21/2012 FINDINGS: VASCULAR Aorta: Normal caliber. Moderate circumferential atheromatous plaque within the a infrarenal abdominal aorta. No aneurysm. No  hemodynamically significant stenosis. No dissection. No periaortic inflammatory change. Celiac: Unremarkable SMA: Unremarkable Renals: Single right and dual left renal arteries. Tiny accessory renal artery on the left supplies the lower pole. Wide patency of the renal vasculature. Normal vascular morphology. No aneurysm. IMA: High-grade focal stenosis (greater than 75%) at the origin. The vessel distally is diminutive, but patent. RIGHT Lower Extremity Inflow: There is thrombosis of the right common iliac artery likely related to insight to thrombosis with bulbous appearing clot seen extending inferiorly from the right common iliac vein to extend into the right internal iliac vein and protrude into the origin of the right external iliac vein. There are several small distal emboli noted within the right internal iliac artery. The right external iliac artery is diseased, but diminutive without hemodynamically significant focal stenosis. Outflow: There is bulbous eccentric mural thrombus within the right common femoral arterial bifurcation resulting in greater than 75% stenoses of the CFA at the takeoff of the anterior and posterior circumflex femoral arteries most in keeping with a distal embolus. The profundus femoral artery is patent but diminutive. The superficial femoral artery is heavily diseased with largely nonocclusive plaque and diminutive demonstrating roughly 50% stenosis throughout its course. Runoff: Atheromatous plaque results in a diffuse 50% stenosis of the popliteal artery. Relatively high takeoff of the right anterior tibial artery from the P2 segment of the popliteal artery the anterior tibial artery continues to form the dorsalis pedis artery. The peroneal artery terminates at the level of the distal tibia diaphysis. The posterior tibial artery terminates within the mid right lower extremity. No distal reconstitution of the posterior tibial artery or plantar arch. LEFT Lower Extremity Inflow:  Heavily disease without focal hemodynamically significant stenosis. Internal iliac artery is patent. Outflow: Focal 50% stenosis of the SFA at its origin. The profundus femoral artery is widely patent. The SFA distally is heavily disease with a focal 50-75% stenosis noted within the mid SFA. Runoff: Popliteal artery is moderately diseased with  diffuse 50% stenosis. Variant takeoff of the anterior tibial artery from the P2 segment of the popliteal artery. Three-vessel runoff to the left foot. Dorsalis pedis and posterior tibial arteries are patent. Veins: No obvious venous abnormality within the limitations of this arterial phase study. Review of the MIP images confirms the above findings. NON-VASCULAR Lower chest: Visualized lung bases are clear. Visualized heart and pericardium are unremarkable. Hepatobiliary: No focal liver abnormality is seen. Status post cholecystectomy. No biliary dilatation. Pancreas: Unremarkable Spleen: Unremarkable Adrenals/Urinary Tract: The adrenal glands are unremarkable. The kidneys are normal. Streak artifact from right hip arthroplasty largely obscures the bladder, however, the visualized segment is unremarkable. Stomach/Bowel: Stomach, small bowel, and large bowel are unremarkable. Lymphatic: No pathologic adenopathy within the abdomen and pelvis. Reproductive: Prostate is unremarkable. Other: No abdominal wall hernia. Musculoskeletal: Right total hip arthroplasty has been performed. Degenerative changes are seen within the lumbar spine. L3-4 lumbar fusion with instrumentation has been performed. 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 Evidence of distal embolization with multiple emboli within the right internal iliac artery peripherally as well as within the right common femoral artery resulting in a greater than 75% stenosis of this vessel  distally. Superimposed peripheral vascular disease with extensive disease of the right lower extremity outflow and runoff as described above with single-vessel runoff to the foot. Posterior tibial artery terminates at the level of the mid diaphysis of the tibia but demonstrates a gradual washout and lack of significant associated plaque making this suspicious for occlusion secondary to distal embolization with poor collateralization. The posterior tibial artery distally and plantar arch are not reconstituted. Dorsalis pedis artery is patent. Extensive peripheral vascular disease with multifocal stenosis of the left SFA and diffuse stenosis of the left popliteal artery. Three-vessel runoff to the left foot with continuation of the dorsalis pedis artery and plantar arch. NON-VASCULAR None significant These results were called by telephone at the time of interpretation on 12/06/2020 at 4:03 am to provider [facility] , who verbally acknowledged these results. Electronically Signed   By: Fidela Salisbury M.D.   On: 12/06/2020 04:05   DG Foot Complete Right  Result Date: 12/06/2020 CLINICAL DATA:  Discoloration to right 4th and 5th digits EXAM: RIGHT FOOT COMPLETE - 3+ VIEW COMPARISON:  None. FINDINGS: No fracture or dislocation is seen. The joint spaces are preserved. No cortical irregularity involving the 4th or 5th digit to suggest acute osteomyelitis. Suspected soft tissue irregularity/ulcer along the lateral aspect of the 5th metacarpal head. IMPRESSION: Suspected soft tissue irregularity/ulcer along the lateral aspect of the 5th metacarpal head. No radiographic findings to suggest acute osteomyelitis. Electronically Signed   By: Julian Hy M.D.   On: 12/06/2020 00:39    Procedures .Critical Care Performed by: Merrily Pew, MD Authorized by: Merrily Pew, MD   Critical care provider statement:    Critical care time (minutes):  45   Critical care was necessary to treat or prevent imminent or  life-threatening deterioration of the following conditions:  Circulatory failure   Critical care was time spent personally by me on the following activities:  Discussions with consultants, evaluation of patient's response to treatment, examination of patient, ordering and performing treatments and interventions, ordering and review of laboratory studies, ordering and review of radiographic studies, pulse oximetry, re-evaluation of patient's condition, obtaining history from patient or surrogate and review of old charts   Medications Ordered  in ED Medications  vancomycin (VANCOCIN) IVPB 1000 mg/200 mL premix (has no administration in time range)  nicotine (NICODERM CQ - dosed in mg/24 hours) patch 21 mg (21 mg Transdermal Patch Applied 12/06/20 0207)  heparin ADULT infusion 100 units/mL (25000 units/244mL) (1,000 Units/hr Intravenous Transfusing/Transfer 12/06/20 0535)  LORazepam (ATIVAN) injection 1 mg (1 mg Intravenous Given 12/06/20 0511)  sodium chloride 0.9 % bolus 1,000 mL (0 mLs Intravenous Stopped 12/06/20 0109)  fentaNYL (SUBLIMAZE) injection 50 mcg (50 mcg Intravenous Given 12/05/20 2330)  vancomycin (VANCOREADY) IVPB 1250 mg/250 mL (0 mg Intravenous Stopped 12/06/20 0358)  sodium chloride 0.9 % bolus 1,000 mL (0 mLs Intravenous Stopped 12/06/20 0358)  HYDROmorphone (DILAUDID) injection 1 mg (1 mg Intravenous Given 12/06/20 0207)  iohexol (OMNIPAQUE) 350 MG/ML injection 125 mL (125 mLs Intravenous Contrast Given 12/06/20 0249)  heparin bolus via infusion 4,000 Units (4,000 Units Intravenous Bolus from Bag 12/06/20 0425)  HYDROmorphone (DILAUDID) injection 1 mg (1 mg Intravenous Given 12/06/20 0448)  HYDROmorphone (DILAUDID) injection 1 mg (1 mg Intravenous Given 12/06/20 0350)    ED Course  I have reviewed the triage vital signs and the nursing notes.  Pertinent labs & imaging results that were available during my care of the patient were reviewed by me and considered in my medical decision  making (see chart for details).    MDM Rules/Calculators/A&P                         With appearance of his toe and erythema around the foot initial concern was for possible osteomyelitis versus infection.  However his x-ray looked okay and he does have a another wound to his foot and some other purpuric looking lesions concerning for possible arterial emboli so CT scan done with results as above.  Heparin started.  Pain control.  Patient initially wanted to leave [place] however was able to be redirected and agreeable to admission.  I did discussed with Dr. Doren Custard who requested ED to ED transfer for further management.  Even after telling the patient he could not eat until seen by a surgeon patient decided he wanted to have some cheese its.  He was once again reminded that this was a high risk to himself and his leg if he could not get prompt surgery.  Patient was then transferred [facility] to [facility] emergency department for evaluation by vascular surgery. Report to Dr. Tyrone Nine in ED there.    Final Clinical Impression(s) / ED Diagnoses Final diagnoses:  Embolism and thrombosis of iliac artery [facility])    Rx / DC Orders ED Discharge Orders     None        Analynn Daum, Corene Cornea, MD 12/06/20 [phone]

## 2020-12-05 NOTE — ED Triage Notes (Addendum)
Pt here for wound check to right foot- says his last two toes are discolored and painful, says he thinks it is gangrene. Pt also wants to get checked out for a fall that he had today, while coming out of bathroom- landing on left side-c/o rib pain.

## 2020-12-06 ENCOUNTER — Other Ambulatory Visit: Payer: Self-pay

## 2020-12-06 ENCOUNTER — Encounter (HOSPITAL_COMMUNITY)
Admission: EM | Disposition: A | Discharge status: Home Health | Payer: Self-pay | Source: Home / Self Care | Attending: Student

## 2020-12-06 ENCOUNTER — Emergency Department (HOSPITAL_COMMUNITY): Payer: Medicare HMO

## 2020-12-06 DIAGNOSIS — W19XXXA Unspecified fall, initial encounter: Secondary | ICD-10-CM | POA: Diagnosis present

## 2020-12-06 DIAGNOSIS — E1152 Type 2 diabetes mellitus with diabetic peripheral angiopathy with gangrene: Secondary | ICD-10-CM | POA: Diagnosis present

## 2020-12-06 DIAGNOSIS — D508 Other iron deficiency anemias: Secondary | ICD-10-CM | POA: Diagnosis not present

## 2020-12-06 DIAGNOSIS — Q272 Other congenital malformations of renal artery: Secondary | ICD-10-CM | POA: Diagnosis not present

## 2020-12-06 DIAGNOSIS — J9610 Chronic respiratory failure, unspecified whether with hypoxia or hypercapnia: Secondary | ICD-10-CM | POA: Diagnosis present

## 2020-12-06 DIAGNOSIS — Z7982 Long term (current) use of aspirin: Secondary | ICD-10-CM | POA: Diagnosis not present

## 2020-12-06 DIAGNOSIS — Z720 Tobacco use: Secondary | ICD-10-CM | POA: Diagnosis not present

## 2020-12-06 DIAGNOSIS — E1165 Type 2 diabetes mellitus with hyperglycemia: Secondary | ICD-10-CM | POA: Diagnosis present

## 2020-12-06 DIAGNOSIS — I745 Embolism and thrombosis of iliac artery: Secondary | ICD-10-CM | POA: Diagnosis present

## 2020-12-06 DIAGNOSIS — I96 Gangrene, not elsewhere classified: Secondary | ICD-10-CM

## 2020-12-06 DIAGNOSIS — I70231 Atherosclerosis of native arteries of right leg with ulceration of thigh: Secondary | ICD-10-CM | POA: Diagnosis not present

## 2020-12-06 DIAGNOSIS — R262 Difficulty in walking, not elsewhere classified: Secondary | ICD-10-CM | POA: Diagnosis present

## 2020-12-06 DIAGNOSIS — E119 Type 2 diabetes mellitus without complications: Secondary | ICD-10-CM | POA: Diagnosis not present

## 2020-12-06 DIAGNOSIS — F419 Anxiety disorder, unspecified: Secondary | ICD-10-CM | POA: Diagnosis present

## 2020-12-06 DIAGNOSIS — L98499 Non-pressure chronic ulcer of skin of other sites with unspecified severity: Secondary | ICD-10-CM | POA: Diagnosis present

## 2020-12-06 DIAGNOSIS — I739 Peripheral vascular disease, unspecified: Secondary | ICD-10-CM

## 2020-12-06 DIAGNOSIS — I10 Essential (primary) hypertension: Secondary | ICD-10-CM | POA: Diagnosis present

## 2020-12-06 DIAGNOSIS — E7800 Pure hypercholesterolemia, unspecified: Secondary | ICD-10-CM | POA: Diagnosis present

## 2020-12-06 DIAGNOSIS — Z9181 History of falling: Secondary | ICD-10-CM | POA: Diagnosis not present

## 2020-12-06 DIAGNOSIS — F32A Depression, unspecified: Secondary | ICD-10-CM | POA: Diagnosis present

## 2020-12-06 DIAGNOSIS — R0781 Pleurodynia: Secondary | ICD-10-CM | POA: Diagnosis not present

## 2020-12-06 DIAGNOSIS — L819 Disorder of pigmentation, unspecified: Secondary | ICD-10-CM | POA: Diagnosis not present

## 2020-12-06 DIAGNOSIS — Z87891 Personal history of nicotine dependence: Secondary | ICD-10-CM | POA: Diagnosis not present

## 2020-12-06 DIAGNOSIS — Z79899 Other long term (current) drug therapy: Secondary | ICD-10-CM

## 2020-12-06 DIAGNOSIS — I70261 Atherosclerosis of native arteries of extremities with gangrene, right leg: Secondary | ICD-10-CM | POA: Diagnosis present

## 2020-12-06 DIAGNOSIS — I708 Atherosclerosis of other arteries: Secondary | ICD-10-CM | POA: Diagnosis present

## 2020-12-06 DIAGNOSIS — Z9981 Dependence on supplemental oxygen: Secondary | ICD-10-CM | POA: Diagnosis not present

## 2020-12-06 DIAGNOSIS — G894 Chronic pain syndrome: Secondary | ICD-10-CM | POA: Diagnosis present

## 2020-12-06 DIAGNOSIS — E11649 Type 2 diabetes mellitus with hypoglycemia without coma: Secondary | ICD-10-CM | POA: Diagnosis not present

## 2020-12-06 DIAGNOSIS — L97909 Non-pressure chronic ulcer of unspecified part of unspecified lower leg with unspecified severity: Secondary | ICD-10-CM | POA: Diagnosis present

## 2020-12-06 DIAGNOSIS — M47816 Spondylosis without myelopathy or radiculopathy, lumbar region: Secondary | ICD-10-CM | POA: Diagnosis not present

## 2020-12-06 DIAGNOSIS — J441 Chronic obstructive pulmonary disease with (acute) exacerbation: Secondary | ICD-10-CM | POA: Diagnosis not present

## 2020-12-06 DIAGNOSIS — E538 Deficiency of other specified B group vitamins: Secondary | ICD-10-CM | POA: Diagnosis present

## 2020-12-06 DIAGNOSIS — K219 Gastro-esophageal reflux disease without esophagitis: Secondary | ICD-10-CM | POA: Diagnosis present

## 2020-12-06 DIAGNOSIS — I743 Embolism and thrombosis of arteries of the lower extremities: Secondary | ICD-10-CM | POA: Diagnosis not present

## 2020-12-06 DIAGNOSIS — J449 Chronic obstructive pulmonary disease, unspecified: Secondary | ICD-10-CM | POA: Diagnosis present

## 2020-12-06 DIAGNOSIS — E11621 Type 2 diabetes mellitus with foot ulcer: Secondary | ICD-10-CM | POA: Diagnosis present

## 2020-12-06 DIAGNOSIS — Z7984 Long term (current) use of oral hypoglycemic drugs: Secondary | ICD-10-CM | POA: Diagnosis not present

## 2020-12-06 DIAGNOSIS — L97518 Non-pressure chronic ulcer of other part of right foot with other specified severity: Secondary | ICD-10-CM | POA: Diagnosis present

## 2020-12-06 DIAGNOSIS — F319 Bipolar disorder, unspecified: Secondary | ICD-10-CM | POA: Diagnosis present

## 2020-12-06 DIAGNOSIS — E785 Hyperlipidemia, unspecified: Secondary | ICD-10-CM | POA: Diagnosis present

## 2020-12-06 DIAGNOSIS — F17210 Nicotine dependence, cigarettes, uncomplicated: Secondary | ICD-10-CM | POA: Diagnosis present

## 2020-12-06 DIAGNOSIS — Z20822 Contact with and (suspected) exposure to covid-19: Secondary | ICD-10-CM | POA: Diagnosis present

## 2020-12-06 DIAGNOSIS — E1140 Type 2 diabetes mellitus with diabetic neuropathy, unspecified: Secondary | ICD-10-CM | POA: Diagnosis present

## 2020-12-06 LAB — CBG MONITORING, ED
Glucose-Capillary: 80 mg/dL (ref 70–99)
Glucose-Capillary: 98 mg/dL (ref 70–99)

## 2020-12-06 LAB — SEDIMENTATION RATE: Sed Rate: 52 mm/hr — ABNORMAL HIGH (ref 0–16)

## 2020-12-06 LAB — RESP PANEL BY RT-PCR (FLU A&B, COVID) ARPGX2
Influenza A by PCR: NEGATIVE
Influenza B by PCR: NEGATIVE
SARS Coronavirus 2 by RT PCR: NEGATIVE

## 2020-12-06 LAB — COMPREHENSIVE METABOLIC PANEL
ALT: 15 U/L (ref 0–44)
AST: 12 U/L — ABNORMAL LOW (ref 15–41)
Albumin: 1.9 g/dL — ABNORMAL LOW (ref 3.5–5.0)
Alkaline Phosphatase: 146 U/L — ABNORMAL HIGH (ref 38–126)
Anion gap: 5 (ref 5–15)
BUN: 7 mg/dL (ref 6–20)
CO2: 29 mmol/L (ref 22–32)
Calcium: 7.7 mg/dL — ABNORMAL LOW (ref 8.9–10.3)
Chloride: 98 mmol/L (ref 98–111)
Creatinine, Ser: 0.43 mg/dL — ABNORMAL LOW (ref 0.61–1.24)
GFR, Estimated: >60 mL/min (ref >60)
Glucose, Bld: 104 mg/dL — ABNORMAL HIGH (ref 70–99)
Potassium: 3.9 mmol/L (ref 3.5–5.1)
Sodium: 132 mmol/L — ABNORMAL LOW (ref 135–145)
Total Bilirubin: 0.2 mg/dL — ABNORMAL LOW (ref 0.3–1.2)
Total Protein: 5.4 g/dL — ABNORMAL LOW (ref 6.5–8.1)

## 2020-12-06 LAB — C-REACTIVE PROTEIN: CRP: 13.5 mg/dL — ABNORMAL HIGH (ref <1.0)

## 2020-12-06 LAB — LACTIC ACID, PLASMA
Lactic Acid, Venous: 0.8 mmol/L (ref 0.5–1.9)
Lactic Acid, Venous: 0.9 mmol/L (ref 0.5–1.9)

## 2020-12-06 LAB — HIV ANTIBODY (ROUTINE TESTING W REFLEX): HIV Screen 4th Generation wRfx: NONREACTIVE

## 2020-12-06 LAB — HEPARIN LEVEL (UNFRACTIONATED)
Heparin Unfractionated: <0.1 IU/mL — ABNORMAL LOW (ref 0.30–0.70)
Heparin Unfractionated: <0.1 IU/mL — ABNORMAL LOW (ref 0.30–0.70)

## 2020-12-06 LAB — HEMOGLOBIN A1C
Hgb A1c MFr Bld: 5.5 % (ref 4.8–5.6)
Mean Plasma Glucose: 111.15 mg/dL

## 2020-12-06 SURGERY — PERIPHERAL VASCULAR THROMBECTOMY
Anesthesia: LOCAL | Laterality: Right

## 2020-12-06 MED ORDER — CALCIUM CARBONATE ANTACID 500 MG PO CHEW: 2.0000 | CHEWABLE_TABLET | Freq: Two times a day (BID) | ORAL | Status: DC | PRN

## 2020-12-06 MED ORDER — HYDROMORPHONE HCL 1 MG/ML IJ SOLN
1.0000 mg | Freq: Once | INTRAMUSCULAR | Status: AC
  Administered 2020-12-06: 1 mg via INTRAVENOUS
  Filled 2020-12-06: qty 1

## 2020-12-06 MED ORDER — ACETAMINOPHEN 325 MG PO TABS
650.0000 mg | ORAL_TABLET | Freq: Four times a day (QID) | ORAL | Status: DC | PRN
  Administered 2020-12-12: 650 mg via ORAL

## 2020-12-06 MED ORDER — SODIUM CHLORIDE 0.9 % IV BOLUS
1000.0000 mL | Freq: Once | INTRAVENOUS | Status: AC
  Administered 2020-12-06: 1000 mL via INTRAVENOUS

## 2020-12-06 MED ORDER — INSULIN ASPART 100 UNIT/ML IJ SOLN
0.0000 [IU] | Freq: Three times a day (TID) | INTRAMUSCULAR | Status: DC
  Administered 2020-12-07: 1 [IU] via SUBCUTANEOUS
  Administered 2020-12-08: 2 [IU] via SUBCUTANEOUS
  Administered 2020-12-09: 1 [IU] via SUBCUTANEOUS
  Administered 2020-12-09: 3 [IU] via SUBCUTANEOUS
  Administered 2020-12-10: 1 [IU] via SUBCUTANEOUS

## 2020-12-06 MED ORDER — OXYCODONE HCL 5 MG PO TABS
5.0000 mg | ORAL_TABLET | Freq: Four times a day (QID) | ORAL | Status: DC | PRN
  Administered 2020-12-06 – 2020-12-07 (×3): 5 mg via ORAL
  Filled 2020-12-06 (×3): qty 1

## 2020-12-06 MED ORDER — VANCOMYCIN HCL IN DEXTROSE 1-5 GM/200ML-% IV SOLN
1000.0000 mg | Freq: Two times a day (BID) | INTRAVENOUS | Status: DC
  Administered 2020-12-06 – 2020-12-09 (×6): 1000 mg via INTRAVENOUS
  Filled 2020-12-06 (×11): qty 200

## 2020-12-06 MED ORDER — NICOTINE 21 MG/24HR TD PT24
21.0000 mg | MEDICATED_PATCH | Freq: Once | TRANSDERMAL | Status: AC
  Administered 2020-12-06: 21 mg via TRANSDERMAL
  Filled 2020-12-06: qty 1

## 2020-12-06 MED ORDER — ACETAMINOPHEN 650 MG RE SUPP: 650.0000 mg | Freq: Four times a day (QID) | RECTAL | Status: DC | PRN

## 2020-12-06 MED ORDER — OXYCODONE-ACETAMINOPHEN 5-325 MG PO TABS
1.0000 | ORAL_TABLET | Freq: Four times a day (QID) | ORAL | Status: DC | PRN
  Administered 2020-12-06 – 2020-12-07 (×3): 1 via ORAL
  Filled 2020-12-06 (×3): qty 1

## 2020-12-06 MED ORDER — METOPROLOL TARTRATE 12.5 MG HALF TABLET
12.5000 mg | ORAL_TABLET | Freq: Two times a day (BID) | ORAL | Status: DC
  Administered 2020-12-06 – 2020-12-13 (×12): 12.5 mg via ORAL
  Filled 2020-12-06 (×14): qty 1

## 2020-12-06 MED ORDER — OXYCODONE-ACETAMINOPHEN 10-325 MG PO TABS: 1.0000 | ORAL_TABLET | Freq: Four times a day (QID) | ORAL | Status: DC | PRN

## 2020-12-06 MED ORDER — HYDRALAZINE HCL 20 MG/ML IJ SOLN: 5.0000 mg | Freq: Four times a day (QID) | INTRAMUSCULAR | Status: DC | PRN

## 2020-12-06 MED ORDER — ONDANSETRON HCL 4 MG/2ML IJ SOLN
4.0000 mg | Freq: Four times a day (QID) | INTRAMUSCULAR | Status: DC | PRN
  Administered 2020-12-11: 4 mg via INTRAVENOUS
  Filled 2020-12-06: qty 2

## 2020-12-06 MED ORDER — GABAPENTIN 300 MG PO CAPS
300.0000 mg | ORAL_CAPSULE | Freq: Three times a day (TID) | ORAL | Status: DC
  Administered 2020-12-06 – 2020-12-13 (×19): 300 mg via ORAL
  Filled 2020-12-06 (×19): qty 1

## 2020-12-06 MED ORDER — HEPARIN (PORCINE) 25000 UT/250ML-% IV SOLN
1450.0000 [IU]/h | INTRAVENOUS | Status: DC
  Administered 2020-12-06: 1000 [IU]/h via INTRAVENOUS
  Administered 2020-12-06: 1200 [IU]/h via INTRAVENOUS
  Filled 2020-12-06 (×3): qty 250

## 2020-12-06 MED ORDER — MAGNESIUM OXIDE -MG SUPPLEMENT 400 (240 MG) MG PO TABS
400.0000 mg | ORAL_TABLET | Freq: Every day | ORAL | Status: DC
  Administered 2020-12-07 – 2020-12-13 (×6): 400 mg via ORAL
  Filled 2020-12-06 (×7): qty 1

## 2020-12-06 MED ORDER — TAMSULOSIN HCL 0.4 MG PO CAPS
0.4000 mg | ORAL_CAPSULE | Freq: Every morning | ORAL | Status: DC
  Administered 2020-12-07 – 2020-12-13 (×7): 0.4 mg via ORAL
  Filled 2020-12-06 (×7): qty 1

## 2020-12-06 MED ORDER — ALLOPURINOL 100 MG PO TABS
100.0000 mg | ORAL_TABLET | Freq: Every morning | ORAL | Status: DC
  Administered 2020-12-07 – 2020-12-13 (×7): 100 mg via ORAL
  Filled 2020-12-06 (×7): qty 1

## 2020-12-06 MED ORDER — IOHEXOL 350 MG/ML SOLN
125.0000 mL | Freq: Once | INTRAVENOUS | Status: AC | PRN
  Administered 2020-12-06: 125 mL via INTRAVENOUS

## 2020-12-06 MED ORDER — ROSUVASTATIN CALCIUM 5 MG PO TABS
10.0000 mg | ORAL_TABLET | Freq: Every day | ORAL | Status: DC
  Administered 2020-12-06 – 2020-12-08 (×3): 10 mg via ORAL
  Filled 2020-12-06 (×2): qty 2

## 2020-12-06 MED ORDER — LORAZEPAM 2 MG/ML IJ SOLN
1.0000 mg | INTRAMUSCULAR | Status: DC | PRN
Start: 2020-12-06 — End: 2020-12-06
  Administered 2020-12-06: 1 mg via INTRAVENOUS
  Filled 2020-12-06: qty 1

## 2020-12-06 MED ORDER — SODIUM CHLORIDE 0.9 % IV SOLN: 250.0000 mL | INTRAVENOUS | Status: DC | PRN

## 2020-12-06 MED ORDER — SODIUM CHLORIDE 0.9% FLUSH: 3.0000 mL | INTRAVENOUS | Status: DC | PRN

## 2020-12-06 MED ORDER — HYDROMORPHONE HCL 1 MG/ML IJ SOLN
0.5000 mg | INTRAMUSCULAR | Status: DC | PRN
  Administered 2020-12-06 – 2020-12-07 (×5): 0.5 mg via INTRAVENOUS
  Filled 2020-12-06 (×5): qty 1

## 2020-12-06 MED ORDER — NICOTINE 21 MG/24HR TD PT24
21.0000 mg | MEDICATED_PATCH | Freq: Every day | TRANSDERMAL | Status: DC
  Administered 2020-12-07 – 2020-12-13 (×6): 21 mg via TRANSDERMAL
  Filled 2020-12-06 (×6): qty 1

## 2020-12-06 MED ORDER — HEPARIN BOLUS VIA INFUSION
2000.0000 [IU] | Freq: Once | INTRAVENOUS | Status: AC
  Administered 2020-12-06: 2000 [IU] via INTRAVENOUS
  Filled 2020-12-06: qty 2000

## 2020-12-06 MED ORDER — ALBUTEROL SULFATE (2.5 MG/3ML) 0.083% IN NEBU: 2.5000 mg | INHALATION_SOLUTION | RESPIRATORY_TRACT | Status: DC | PRN

## 2020-12-06 MED ORDER — HEPARIN BOLUS VIA INFUSION
4000.0000 [IU] | Freq: Once | INTRAVENOUS | Status: AC
  Administered 2020-12-06: 4000 [IU] via INTRAVENOUS

## 2020-12-06 MED ORDER — VANCOMYCIN HCL 1250 MG/250ML IV SOLN
1250.0000 mg | Freq: Once | INTRAVENOUS | Status: AC
  Administered 2020-12-06: 1250 mg via INTRAVENOUS
  Filled 2020-12-06: qty 250

## 2020-12-06 MED ORDER — PANTOPRAZOLE SODIUM 40 MG PO TBEC
80.0000 mg | DELAYED_RELEASE_TABLET | Freq: Every day | ORAL | Status: DC
  Administered 2020-12-07 – 2020-12-13 (×6): 80 mg via ORAL
  Filled 2020-12-06 (×6): qty 2

## 2020-12-06 MED ORDER — ONDANSETRON HCL 4 MG PO TABS: 4.0000 mg | ORAL_TABLET | Freq: Four times a day (QID) | ORAL | Status: DC | PRN

## 2020-12-06 MED ORDER — SENNOSIDES-DOCUSATE SODIUM 8.6-50 MG PO TABS: 1.0000 | ORAL_TABLET | Freq: Every evening | ORAL | Status: DC | PRN

## 2020-12-06 MED ORDER — CLONAZEPAM 0.5 MG PO TABS
0.5000 mg | ORAL_TABLET | Freq: Two times a day (BID) | ORAL | Status: DC | PRN
  Administered 2020-12-06 – 2020-12-12 (×2): 0.5 mg via ORAL
  Filled 2020-12-06 (×2): qty 1

## 2020-12-06 MED ORDER — BUPROPION HCL ER (XL) 150 MG PO TB24
150.0000 mg | ORAL_TABLET | Freq: Every day | ORAL | Status: DC
  Administered 2020-12-07 – 2020-12-13 (×6): 150 mg via ORAL
  Filled 2020-12-06 (×6): qty 1

## 2020-12-06 MED ORDER — SODIUM CHLORIDE 0.9% FLUSH
3.0000 mL | Freq: Two times a day (BID) | INTRAVENOUS | Status: DC
  Administered 2020-12-06 – 2020-12-13 (×14): 3 mL via INTRAVENOUS

## 2020-12-06 NOTE — ED Provider Notes (Signed)
Patient arrived in transfer from [facility], patient with significant clot burden to the right lower extremity with necrosis to the fifth digit.  Sent for evaluation by vascular surgery.  Patient still having some significant discomfort.  We will give a dose of pain medicine.  Vascular notified upon arrival.   Deno Etienne, [place] 12/06/20 [phone]

## 2020-12-06 NOTE — ED Notes (Signed)
Pt threatening to leave. Informed of risks. Agreed to stay at this time

## 2020-12-06 NOTE — Progress Notes (Signed)
ANTICOAGULATION CONSULT NOTE  Pharmacy Consult for Heparin  Indication: Right common iliac artery thrombosis  Allergies  Allergen Reactions   Morphine And Related Shortness Of Breath and Palpitations   Penicillins Anaphylaxis   Vicodin [Hydrocodone-Acetaminophen] Shortness Of Breath and Palpitations   Latex Hives and Rash    Patient Measurements: Height: 5\' 8"  (172.7 cm) Weight: 64 kg (141 lb) IBW/kg (Calculated) : 68.4 Heparin dosing weight = 64 kg  Vital Signs: Temp: 98.6 F (37 C) (09/28 1954) Temp Source: Oral (09/28 1954) BP: 119/76 (09/28 1954) Pulse Rate: 87 (09/28 1954)  Labs: Recent Labs    12/05/20 2321 12/06/20 1248 12/06/20 2000  HGB 11.4*  --   --   HCT 35.0*  --   --   PLT 428*  --   --   HEPARINUNFRC  --  <0.10* <0.10*  CREATININE 0.43*  --   --      Estimated Creatinine Clearance: 90 mL/min (A) (by C-G formula based on SCr of 0.43 mg/dL (L)).   Assessment:  60 y/o M with gangrenous foot, found to have right common iliac artery thrombosis per CT, starting heparin per pharmacy.  Heparin level remains undetectable.  No issue with infusion, IV site nor bleeding per RN.  Goal of Therapy:  Heparin level 0.3-0.7 units/ml Monitor platelets by anticoagulation protocol: Yes   Plan:  Heparin 2000 units IV bolus, then Increase heparin infusion to 1450 units/hr F/U AM labs  Babe Anthis D. Mina Marble, PharmD, BCPS, [place] 12/06/2020, 9:18 PM

## 2020-12-06 NOTE — ED Notes (Signed)
Pt to ct 

## 2020-12-06 NOTE — Progress Notes (Signed)
ANTICOAGULATION CONSULT NOTE   Pharmacy Consult for Heparin  Indication: Right common iliac artery thrombosis  Allergies  Allergen Reactions   Penicillins Anaphylaxis   Latex    Morphine And Related    Vicodin [Hydrocodone-Acetaminophen]     Patient Measurements: Height: 5\' 8"  (172.7 cm) Weight: 64 kg (141 lb) IBW/kg (Calculated) : 68.4  Vital Signs: Temp: 98.5 F (36.9 C) (09/27 2150) BP: 133/76 (09/28 0300) Pulse Rate: 84 (09/28 0306)  Labs: Recent Labs    12/05/20 2321  HGB 11.4*  HCT 35.0*  PLT 428*  CREATININE 0.43*    Estimated Creatinine Clearance: 90 mL/min (A) (by C-G formula based on SCr of 0.43 mg/dL (L)).   Medical History: Past Medical History:  Diagnosis Date   Anxiety    Back pain    Bipolar disorder ([place])    Borderline hypertension    Chronic pain    COPD (chronic obstructive pulmonary disease) (HCC)    Depression with anxiety    GERD (gastroesophageal reflux disease)    Headache    Hip pain    Hypercholesterolemia    Stroke (HCC)    Tobacco use     Assessment: 60 y/o M with gangrenous foot, found to have right common iliac artery thrombosis per CT, starting heparin per pharmacy, Hgb 11.4, renal function is good, PTA meds reviewed.   Goal of Therapy:  Heparin level 0.3-0.7 units/ml Monitor platelets by anticoagulation protocol: Yes   Plan:  Heparin 4000 units BOLUS Start heparin drip at 1000 units/hr 1200 Heparin level Daily CBC/Heparin level Monitor for bleeding  Narda Bonds, PharmD, BCPS Clinical Pharmacist Phone: [phone]

## 2020-12-06 NOTE — ED Notes (Signed)
Pt departed with [facility] at this time.

## 2020-12-06 NOTE — ED Notes (Signed)
Pt found eating. Advised he could not eat anything until cleared by surgery.

## 2020-12-06 NOTE — Progress Notes (Signed)
Lower extremity saphenous vein mapping study completed.   Please see CV Proc for preliminary results.   Darlin Coco, RDMS, RVT

## 2020-12-06 NOTE — Progress Notes (Signed)
Pharmacy Antibiotic Note  Raymond [name] is a 60 y.o. male admitted on 12/05/2020 with  foot wound .  Pharmacy has been consulted for vanc dosing.  Pt presented to the ED for a wound check on foot. He thinks that it's gangrenous. Vanc has been ordered empirically.  Scr 0.43 CrCl 90 ml/min  Plan: Vanc 1.25g IV x1 then 1g IV q12>>AUC 515, scr 0.8 Level as needed  Height: 5\' 8"  (172.7 cm) Weight: 64 kg (141 lb) IBW/kg (Calculated) : 68.4  Temp (24hrs), Avg:98.5 F (36.9 C), Min:98.5 F (36.9 C), Max:98.5 F (36.9 C)  Recent Labs  Lab 12/05/20 2321 12/06/20 0107  WBC 17.7*  --   CREATININE 0.43*  --   LATICACIDVEN 0.8 0.9    Estimated Creatinine Clearance: 90 mL/min (A) (by C-G formula based on SCr of 0.43 mg/dL (L)).    Allergies  Allergen Reactions   Penicillins Anaphylaxis   Latex    Morphine And Related    Vicodin [Hydrocodone-Acetaminophen]     Antimicrobials this admission: 9/28 vanc>>  Dose adjustments this admission:   Microbiology results:  Onnie Boer, PharmD, BCIDP, AAHIVP, CPP Infectious Disease Pharmacist 12/06/2020 1:41 AM

## 2020-12-06 NOTE — ED Notes (Signed)
ED TO INPATIENT HANDOFF REPORT  ED Nurse Name and Phone #: S Name/Age/Gender Raymond [name] 60 y.o. male Room/Bed: 039C/039C  Code Status   Code Status: Full Code  Home/SNF/Other Home Patient oriented to: self, place, time, and situation Is this baseline? Yes   Triage Complete: Triage complete  Chief Complaint Peripheral vascular disease of lower extremity with ulceration ([place]) [I73.9, L97.909]  Triage Note Pt here for wound check to right foot- says his last two toes are discolored and painful, says he thinks it is gangrene. Pt also wants to get checked out for a fall that he had today, while coming out of bathroom- landing on left side-c/o rib pain.   Pt transferred here by [facility] for a wound to his right foot. Vascular consult due to poor circulation.   Allergies Allergies  Allergen Reactions   Morphine And Related Shortness Of Breath and Palpitations   Penicillins Anaphylaxis   Vicodin [Hydrocodone-Acetaminophen] Shortness Of Breath and Palpitations   Latex Hives and Rash    Level of Care/Admitting Diagnosis ED Disposition     ED Disposition  Admit   Condition  --   Comment  Hospital Area: [facility] [[id number]]  Level of Care: Med-Surg [16]  May admit patient to [facility] or [facility] if equivalent level of care is available:: No  Covid Evaluation: Confirmed COVID Negative  Diagnosis: Peripheral vascular disease of lower extremity with ulceration [facility]) [[id number]]  Admitting Physician: Orma Flaming [[id number]]  Attending Physician: Orma Flaming [[id number]]  Estimated length of stay: past midnight tomorrow  Certification:: I certify this patient will need inpatient services for at least 2 midnights          B Medical/Surgery History Past Medical History:  Diagnosis Date   Anxiety    Back pain    Bipolar disorder ([place])    Borderline hypertension    Chronic pain    COPD (chronic obstructive pulmonary disease) ([place])    Depression  with anxiety    GERD (gastroesophageal reflux disease)    Headache    Hip pain    Hypercholesterolemia    Stroke ([place])    Tobacco use    Past Surgical History:  Procedure Laterality Date   ANTERIOR FUSION CERVICAL SPINE     BACK SURGERY     CHOLECYSTECTOMY     COLONOSCOPY N/A 10/28/2012   PXT:GGYIRS polyp-hyperplastic. Internal hemorrhoids. Distal 5 cm of terminal ileum appeared normal.   CORONARY ANGIOPLASTY WITH STENT PLACEMENT  1980s   ESOPHAGOGASTRODUODENOSCOPY N/A 10/28/2012   WNI:OEVOJJKK gastric mucosa with mottling and submucosal petechiae. Mild chronic gastritis but no H. pylori home path. Small hiatal hernia. Duodenal lipoma   EYE SURGERY  Left eye   as a child   LUMBAR SPINE SURGERY     3 x      A IV Location/Drains/Wounds Patient Lines/Drains/Airways Status     Active Line/Drains/Airways     Name Placement date Placement time Site Days   Peripheral IV 12/05/20 20 G Distal;Posterior;Right Forearm 12/05/20  2329  Forearm  1   Peripheral IV 12/06/20 20 G Anterior;Distal;Right;Upper Arm 12/06/20  0448  Arm  less than 1            Intake/Output Last 24 hours  Intake/Output Summary (Last 24 hours) at 12/06/2020 1850 Last data filed at 12/06/2020 1659 Gross per 24 hour  Intake 199.01 ml  Output --  Net 199.01 ml    Labs/Imaging Results for orders placed or performed during the hospital  encounter of 12/05/20 (from the past 48 hour(s))  CBC with Differential     Status: Abnormal   Collection Time: 12/05/20 11:21 PM  Result Value Ref Range   WBC 17.7 (H) 4.0 - 10.5 K/uL   RBC 4.16 (L) 4.22 - 5.81 MIL/uL   Hemoglobin 11.4 (L) 13.0 - 17.0 g/dL   HCT 35.0 (L) 39.0 - 52.0 %   MCV 84.1 80.0 - 100.0 fL   MCH 27.4 26.0 - 34.0 pg   MCHC 32.6 30.0 - 36.0 g/dL   RDW 17.2 (H) 11.5 - 15.5 %   Platelets 428 (H) 150 - 400 K/uL   nRBC 0.0 0.0 - 0.2 %   Neutrophils Relative % 72 %   Neutro Abs 12.8 (H) 1.7 - 7.7 K/uL   Lymphocytes Relative 21 %   Lymphs Abs 3.7 0.7 -  4.0 K/uL   Monocytes Relative 6 %   Monocytes Absolute 1.0 0.1 - 1.0 K/uL   Eosinophils Relative 0 %   Eosinophils Absolute 0.0 0.0 - 0.5 K/uL   Basophils Relative 0 %   Basophils Absolute 0.1 0.0 - 0.1 K/uL   Immature Granulocytes 1 %   Abs Immature Granulocytes 0.09 (H) 0.00 - 0.07 K/uL    Comment: Performed at [facility], [address], [place], [place] [address]  Comprehensive metabolic panel     Status: Abnormal   Collection Time: 12/05/20 11:21 PM  Result Value Ref Range   Sodium 132 (L) 135 - 145 mmol/L   Potassium 3.9 3.5 - 5.1 mmol/L   Chloride 98 98 - 111 mmol/L   CO2 29 22 - 32 mmol/L   Glucose, Bld 104 (H) 70 - 99 mg/dL    Comment: Glucose reference range applies only to samples taken after fasting for at least 8 hours.   BUN 7 6 - 20 mg/dL   Creatinine, Ser 0.43 (L) 0.61 - 1.24 mg/dL   Calcium 7.7 (L) 8.9 - 10.3 mg/dL   Total Protein 5.4 (L) 6.5 - 8.1 g/dL   Albumin 1.9 (L) 3.5 - 5.0 g/dL   AST 12 (L) 15 - 41 U/L   ALT 15 0 - 44 U/L   Alkaline Phosphatase 146 (H) 38 - 126 U/L   Total Bilirubin 0.2 (L) 0.3 - 1.2 mg/dL   GFR, Estimated >60 >60 mL/min    Comment: (NOTE) Calculated using the CKD-EPI Creatinine Equation (2021)    Anion gap 5 5 - 15    Comment: Performed at [facility], [address] [place], [place] [address]  Lactic acid, plasma     Status: None   Collection Time: 12/05/20 11:21 PM  Result Value Ref Range   Lactic Acid, Venous 0.8 0.5 - 1.9 mmol/L    Comment: Performed at [facility], [address], [place], [place] [address]  Sedimentation rate     Status: Abnormal   Collection Time: 12/05/20 11:21 PM  Result Value Ref Range   Sed Rate 52 (H) 0 - 16 mm/hr    Comment: Performed at [facility], [address], [place], [place] [address]  C-reactive protein     Status: Abnormal   Collection Time: 12/05/20 11:21 PM  Result Value Ref Range   CRP 13.5 (H) <1.0 mg/dL    Comment: Performed at [facility], [address], [place], [place]  [address]  Lactic acid, plasma     Status: None   Collection Time: 12/06/20  1:07 AM  Result Value Ref Range   Lactic Acid,  Venous 0.9 0.5 - 1.9 mmol/L    Comment: Performed at [facility], [address], [place], [place] [address]  Resp Panel by RT-PCR (Flu A&B, Covid) Nasopharyngeal Swab     Status: None   Collection Time: 12/06/20  4:32 AM   Specimen: Nasopharyngeal Swab; Nasopharyngeal(NP) swabs in vial transport medium  Result Value Ref Range   SARS Coronavirus 2 by RT PCR NEGATIVE NEGATIVE    Comment: (NOTE) SARS-CoV-2 target nucleic acids are NOT DETECTED.  The SARS-CoV-2 RNA is generally detectable in upper respiratory specimens during the acute phase of infection. The lowest concentration of SARS-CoV-2 viral copies this assay can detect is 138 copies/mL. A negative result does not preclude SARS-Cov-2 infection and should not be used as the sole basis for treatment or other patient management decisions. A negative result may occur with  improper specimen collection/handling, submission of specimen other than nasopharyngeal swab, presence of viral mutation(s) within the areas targeted by this assay, and inadequate number of viral copies(<138 copies/mL). A negative result must be combined with clinical observations, patient history, and epidemiological information. The expected result is Negative.  Fact Sheet for Patients:  EntrepreneurPulse.com.au  Fact Sheet for Healthcare Providers:  IncredibleEmployment.be  This test is no t yet approved or cleared by the [place] FDA and  has been authorized for detection and/or diagnosis of SARS-CoV-2 by FDA under an Emergency Use Authorization (EUA). This EUA will remain  in effect (meaning this test can be used) for the duration of the COVID-19 declaration under Section 564(b)(1) of the Act, 21 U.S.C.section 360bbb-3(b)(1), unless the authorization is terminated  or revoked sooner.        Influenza A by PCR NEGATIVE NEGATIVE   Influenza B by PCR NEGATIVE NEGATIVE    Comment: (NOTE) The Xpert Xpress SARS-CoV-2/FLU/RSV plus assay is intended as an aid in the diagnosis of influenza from Nasopharyngeal swab specimens and should not be used as a sole basis for treatment. Nasal washings and aspirates are unacceptable for Xpert Xpress SARS-CoV-2/FLU/RSV testing.  Fact Sheet for Patients: EntrepreneurPulse.com.au  Fact Sheet for Healthcare Providers: IncredibleEmployment.be  This test is not yet approved or cleared by the [place] FDA and has been authorized for detection and/or diagnosis of SARS-CoV-2 by FDA under an Emergency Use Authorization (EUA). This EUA will remain in effect (meaning this test can be used) for the duration of the COVID-19 declaration under Section 564(b)(1) of the Act, 21 U.S.C. section 360bbb-3(b)(1), unless the authorization is terminated or revoked.  Performed at [facility], [address], [place], [place] [address]   CBG monitoring, ED     Status: None   Collection Time: 12/06/20  7:38 AM  Result Value Ref Range   Glucose-Capillary 80 70 - 99 mg/dL    Comment: Glucose reference range applies only to samples taken after fasting for at least 8 hours.  Heparin level (unfractionated)     Status: Abnormal   Collection Time: 12/06/20 12:48 PM  Result Value Ref Range   Heparin Unfractionated <0.10 (L) 0.30 - 0.70 IU/mL    Comment: (NOTE) The clinical reportable range upper limit is being lowered to >1.10 to align with the FDA approved guidance for the current laboratory assay.  If heparin results are below expected values, and patient dosage has  been confirmed, suggest follow up testing of antithrombin III levels. Performed at [facility], [place] [address], [place], [place] [address]   CBG monitoring, ED     Status: None  Collection Time: 12/06/20  5:12 PM  Result Value Ref Range    Glucose-Capillary 98 70 - 99 mg/dL    Comment: Glucose reference range applies only to samples taken after fasting for at least 8 hours.   DG Ribs Unilateral W/Chest Left  Result Date: 12/06/2020 CLINICAL DATA:  Fall, left rib pain EXAM: LEFT RIBS AND CHEST - 3+ VIEW COMPARISON:  CT chest dated 09/02/2013 FINDINGS: Lungs are clear.  No pleural effusion or pneumothorax. The heart is normal in size. No displaced left rib fracture is seen. Cervical spine fixation hardware. IMPRESSION: No displaced left rib fracture is seen. No evidence of acute cardiopulmonary disease. Electronically Signed   By: Julian Hy M.D.   On: 12/06/2020 00:30   CT ANGIO AO+BIFEM W & OR WO CONTRAST  Result Date: 12/06/2020 CLINICAL DATA:  Right fourth and 5th toe gangrene, Arterial embolism, lower extremity EXAM: CT ANGIOGRAPHY OF ABDOMINAL AORTA WITH ILIOFEMORAL RUNOFF TECHNIQUE: Multidetector CT imaging of the abdomen, pelvis and lower extremities was performed using the standard protocol during bolus administration of intravenous contrast. Multiplanar CT image reconstructions and MIPs were obtained to evaluate the vascular anatomy. CONTRAST:  142mL OMNIPAQUE IOHEXOL 350 MG/ML SOLN COMPARISON:  CTA abdomen pelvis 10/21/2012 FINDINGS: VASCULAR Aorta: Normal caliber. Moderate circumferential atheromatous plaque within the a infrarenal abdominal aorta. No aneurysm. No hemodynamically significant stenosis. No dissection. No periaortic inflammatory change. Celiac: Unremarkable SMA: Unremarkable Renals: Single right and dual left renal arteries. Tiny accessory renal artery on the left supplies the lower pole. Wide patency of the renal vasculature. Normal vascular morphology. No aneurysm. IMA: High-grade focal stenosis (greater than 75%) at the origin. The vessel distally is diminutive, but patent. RIGHT Lower Extremity Inflow: There is thrombosis of the right common iliac artery likely related to insight to thrombosis with bulbous  appearing clot seen extending inferiorly from the right common iliac vein to extend into the right internal iliac vein and protrude into the origin of the right external iliac vein. There are several small distal emboli noted within the right internal iliac artery. The right external iliac artery is diseased, but diminutive without hemodynamically significant focal stenosis. Outflow: There is bulbous eccentric mural thrombus within the right common femoral arterial bifurcation resulting in greater than 75% stenoses of the CFA at the takeoff of the anterior and posterior circumflex femoral arteries most in keeping with a distal embolus. The profundus femoral artery is patent but diminutive. The superficial femoral artery is heavily diseased with largely nonocclusive plaque and diminutive demonstrating roughly 50% stenosis throughout its course. Runoff: Atheromatous plaque results in a diffuse 50% stenosis of the popliteal artery. Relatively high takeoff of the right anterior tibial artery from the P2 segment of the popliteal artery the anterior tibial artery continues to form the dorsalis pedis artery. The peroneal artery terminates at the level of the distal tibia diaphysis. The posterior tibial artery terminates within the mid right lower extremity. No distal reconstitution of the posterior tibial artery or plantar arch. LEFT Lower Extremity Inflow: Heavily disease without focal hemodynamically significant stenosis. Internal iliac artery is patent. Outflow: Focal 50% stenosis of the SFA at its origin. The profundus femoral artery is widely patent. The SFA distally is heavily disease with a focal 50-75% stenosis noted within the mid SFA. Runoff: Popliteal artery is moderately diseased with diffuse 50% stenosis. Variant takeoff of the anterior tibial artery from the P2 segment of the popliteal artery. Three-vessel runoff to the left foot. Dorsalis pedis and posterior tibial arteries are  patent. Veins: No obvious  venous abnormality within the limitations of this arterial phase study. Review of the MIP images confirms the above findings. NON-VASCULAR Lower chest: Visualized lung bases are clear. Visualized heart and pericardium are unremarkable. Hepatobiliary: No focal liver abnormality is seen. Status post cholecystectomy. No biliary dilatation. Pancreas: Unremarkable Spleen: Unremarkable Adrenals/Urinary Tract: The adrenal glands are unremarkable. The kidneys are normal. Streak artifact from right hip arthroplasty largely obscures the bladder, however, the visualized segment is unremarkable. Stomach/Bowel: Stomach, small bowel, and large bowel are unremarkable. Lymphatic: No pathologic adenopathy within the abdomen and pelvis. Reproductive: Prostate is unremarkable. Other: No abdominal wall hernia. Musculoskeletal: Right total hip arthroplasty has been performed. Degenerative changes are seen within the lumbar spine. L3-4 lumbar fusion with instrumentation has been performed. 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 Evidence of distal embolization with multiple emboli within the right internal iliac artery peripherally as well as within the right common femoral artery resulting in a greater than 75% stenosis of this vessel distally. Superimposed peripheral vascular disease with extensive disease of the right lower extremity outflow and runoff as described above with single-vessel runoff to the foot. Posterior tibial artery terminates at the level of the mid diaphysis of the tibia but demonstrates a gradual washout and lack of significant associated plaque making this suspicious for occlusion secondary to distal embolization with poor collateralization. The posterior tibial artery distally and plantar arch are not reconstituted. Dorsalis pedis artery is patent. Extensive peripheral vascular  disease with multifocal stenosis of the left SFA and diffuse stenosis of the left popliteal artery. Three-vessel runoff to the left foot with continuation of the dorsalis pedis artery and plantar arch. NON-VASCULAR None significant These results were called by telephone at the time of interpretation on 12/06/2020 at 4:03 am to provider [facility] , who verbally acknowledged these results. Electronically Signed   By: Fidela Salisbury M.D.   On: 12/06/2020 04:05   DG Foot Complete Right  Result Date: 12/06/2020 CLINICAL DATA:  Discoloration to right 4th and 5th digits EXAM: RIGHT FOOT COMPLETE - 3+ VIEW COMPARISON:  None. FINDINGS: No fracture or dislocation is seen. The joint spaces are preserved. No cortical irregularity involving the 4th or 5th digit to suggest acute osteomyelitis. Suspected soft tissue irregularity/ulcer along the lateral aspect of the 5th metacarpal head. IMPRESSION: Suspected soft tissue irregularity/ulcer along the lateral aspect of the 5th metacarpal head. No radiographic findings to suggest acute osteomyelitis. Electronically Signed   By: Julian Hy M.D.   On: 12/06/2020 00:39   VAS [place] LOWER EXTREMITY SAPHENOUS VEIN MAPPING  Result Date: 12/06/2020 LOWER EXTREMITY VEIN MAPPING Patient Name:  DELANCE WEIDE  Date of Exam:   12/06/2020 Medical Rec #: [id number]    Accession #:    [id number] Date of Birth: Jan 24, 1961    Patient Gender: M Patient Age:   77 years Exam Location:  [facility] Procedure:      VAS [place] LOWER EXTREMITY SAPHENOUS VEIN MAPPING Referring Phys: Harrell Gave DICKSON --------------------------------------------------------------------------------  Indications:  PVD w/ gangrene right lower extremity Risk Factors: Hyperlipidemia, Diabetes, current smoker.  Comparison Study: No prior studies. Performing Technologist: Darlin Coco RDMS, RVT  Examination Guidelines: A complete evaluation includes B-mode imaging, spectral Doppler, color Doppler, and power Doppler as  needed of all accessible portions of each vessel. Bilateral testing is considered an integral part of a complete examination. Limited examinations  for reoccurring indications may be performed as noted. +------------+-----------+----------------------+------------+-----------------+ RT Diameter RT Findings         GSV          LT Diameter    LT Findings        (cm)                                         (cm)                      +------------+-----------+----------------------+------------+-----------------+     0.43     branching     Saphenofemoral        0.40                                                    Junction                                     +------------+-----------+----------------------+------------+-----------------+     0.33                   Proximal thigh        0.32                      +------------+-----------+----------------------+------------+-----------------+     0.24     branching       Mid thigh           0.25        branching     +------------+-----------+----------------------+------------+-----------------+     0.22                    Distal thigh         0.19        branching     +------------+-----------+----------------------+------------+-----------------+     0.24                        Knee                     branching and not                                                           well visualized  +------------+-----------+----------------------+------------+-----------------+     0.21     branching       Prox calf           0.13                      +------------+-----------+----------------------+------------+-----------------+     0.14                      Mid calf           0.11                      +------------+-----------+----------------------+------------+-----------------+     0.19  Distal calf          0.13                       +------------+-----------+----------------------+------------+-----------------+ +----------------+-----------+---------------+----------------+-----------+ RT diameter (cm)RT Findings      SSV      LT Diameter (cm)LT Findings +----------------+-----------+---------------+----------------+-----------+       0.34                 Popliteal fossa                            +----------------+-----------+---------------+----------------+-----------+       0.31                  Proximal calf                             +----------------+-----------+---------------+----------------+-----------+       0.32                    Mid calf                                +----------------+-----------+---------------+----------------+-----------+       0.25                   Distal calf                              +----------------+-----------+---------------+----------------+-----------+ Diagnosing physician: Jamelle Haring Electronically signed by Jamelle Haring on 12/06/2020 at 5:23:20 PM.    Final     Pending Labs Unresulted Labs (From admission, onward)     Start     Ordered   12/07/20 0500  CBC  Tomorrow morning,   R        12/06/20 1354   12/07/20 0500  Comprehensive metabolic panel  Tomorrow morning,   R        12/06/20 1354   12/06/20 2000  Heparin level (unfractionated)  Once-Timed,   TIMED        12/06/20 1403   12/06/20 1351  HIV Antibody (routine testing w rflx)  (HIV Antibody (Routine testing w reflex) panel)  Once,   R        12/06/20 1354   12/06/20 1320  Hemoglobin A1c  Once,   R        12/06/20 1319            Vitals/Pain Today's Vitals   12/06/20 1149 12/06/20 1345 12/06/20 1755 12/06/20 1758  BP:  (!) 154/76  (!) 115/91  Pulse:  86  85  Resp:  18  17  Temp:      TempSrc:      SpO2:  98%  100%  Weight:      Height:      PainSc: 0-No pain  5      Isolation Precautions No active isolations  Medications Medications  vancomycin (VANCOCIN) IVPB 1000  mg/200 mL premix (0 mg Intravenous Stopped 12/06/20 1659)  nicotine (NICODERM CQ - dosed in mg/24 hours) patch 21 mg (21 mg Transdermal Patch Applied 12/06/20 0207)  heparin ADULT infusion 100 units/mL (25000 units/261mL) (1,200 Units/hr Intravenous New Bag/Given 12/06/20 1758)  insulin aspart (novoLOG) injection 0-9 Units (0 Units Subcutaneous Not Given 12/06/20 1713)  sodium chloride flush (  NS) 0.9 % injection 3 mL (3 mLs Intravenous Given 12/06/20 1416)  sodium chloride flush (NS) 0.9 % injection 3 mL (has no administration in time range)  0.9 %  sodium chloride infusion (has no administration in time range)  acetaminophen (TYLENOL) tablet 650 mg (has no administration in time range)    Or  acetaminophen (TYLENOL) suppository 650 mg (has no administration in time range)  HYDROmorphone (DILAUDID) injection 0.5 mg (0.5 mg Intravenous Given 12/06/20 1557)  senna-docusate (Senokot-S) tablet 1 tablet (has no administration in time range)  ondansetron (ZOFRAN) tablet 4 mg (has no administration in time range)    Or  ondansetron (ZOFRAN) injection 4 mg (has no administration in time range)  albuterol (PROVENTIL) (2.5 MG/3ML) 0.083% nebulizer solution 2.5 mg (has no administration in time range)  nicotine (NICODERM CQ - dosed in mg/24 hours) patch 21 mg (has no administration in time range)  hydrALAZINE (APRESOLINE) injection 5 mg (has no administration in time range)  clonazePAM (KLONOPIN) tablet 0.5 mg (has no administration in time range)  sodium chloride 0.9 % bolus 1,000 mL (0 mLs Intravenous Stopped 12/06/20 0109)  fentaNYL (SUBLIMAZE) injection 50 mcg (50 mcg Intravenous Given 12/05/20 2330)  vancomycin (VANCOREADY) IVPB 1250 mg/250 mL (0 mg Intravenous Stopped 12/06/20 0358)  sodium chloride 0.9 % bolus 1,000 mL (0 mLs Intravenous Stopped 12/06/20 0358)  HYDROmorphone (DILAUDID) injection 1 mg (1 mg Intravenous Given 12/06/20 0207)  iohexol (OMNIPAQUE) 350 MG/ML injection 125 mL (125 mLs Intravenous  Contrast Given 12/06/20 0249)  heparin bolus via infusion 4,000 Units (4,000 Units Intravenous Bolus from Bag 12/06/20 0425)  HYDROmorphone (DILAUDID) injection 1 mg (1 mg Intravenous Given 12/06/20 0448)  HYDROmorphone (DILAUDID) injection 1 mg (1 mg Intravenous Given 12/06/20 0612)  heparin bolus via infusion 2,000 Units (2,000 Units Intravenous Bolus from Bag 12/06/20 1415)    Mobility non-ambulatory Low fall risk   Focused Assessments Cardiac Assessment Handoff:    Lab Results  Component Value Date   CKTOTAL 112 01/22/2009   CKMB 2.2 01/22/2009   TROPONINI <0.03 06/22/2017   No results found for: DDIMER Does the Patient currently have chest pain? No    R Recommendations: See Admitting Provider Note  Report given to:   Additional Notes:

## 2020-12-06 NOTE — H&P (Addendum)
History and Physical    Raymond [name] TXM:[id number] DOB: Mar 18, 1960 DOA: 12/05/2020  PCP: [name], MD Consultants:   Patient coming from:  Home - lives alone   Chief Complaint: right foot pain, black ulcer on right foot.   HPI: Raymond [name] is a 60 y.o. male with medical history significant of COPD on chronic oxygen at home prn, T2DM, GERD, chronic pain on oxycodone, HLD, gout, anxiety and depression and neuropathy who presented to ED for worsening right foot pain and ulceration.  He also fell into the door jam onto his left side. He has been unable to walk due to a fall about 6 months ago. He is able to take a few steps with a walker, but is very unstable and painful.  He has had no fever/chills. He has had no known history that he is aware of of PVD.  He states he started to have right foot pain with an ulcer about 3 weeks ago and it has progressively gotten worse. Pain rated as a 9/10 and sharp and stabbing in nature and is constant. Pain medicine does help some. Movement makes it worse and when pain meds wear off. Also states he does have pain in his calves when he walks, but history is difficult to really grasp on this and for how long.   Risk factors of vascular disease include: smoking 3-5ppd, hyperlipidemia, HTN.    Denies any vision changes, headaches,chest pain/shortness of breath from baseline, coughing, palpitations, stomach pain. Has intermittent nausea at times at his baseline, no diarrhea/ vomiting. He has mild swelling in his legs, but no worse than normal.     ED Course: vitals: Afebrile, blood pressure 125/83, heart rate 116, respiratory rate 18, oxygen 94% on room air. Pertinent labs: WBC: 17.7, hemoglobin 11.4, sodium 132 (1 29-1 33), albumin 1.9, alkaline phosphatase 146, lactic acid 0.8 and repeat up 0.9, ESR 52, CRP 13.5, COVID and flu negative. Right foot x-ray shows suspected soft tissue irregularity/ulcer along the lateral aspect of the fifth metacarpal head, no  radiographic findings suggests to suggest acute osteomyelitis.  CT angiography of abdominal aorta with iliofemoral runoff: Site to thrombosis of the right common iliac artery with bulbous appearing clot extending into the right external iliac artery and internal iliac artery.  Evidence of distal embolization with multiple emboli within the right internal iliac artery peripherally as well as within the right common femoral artery resulting in a greater than 75% stenosis of this vessel distally.  Extensive peripheral vascular disease.  Single-vessel runoff to the foot.  Extensive peripheral vascular disease with multifocal stenosis of the left SFA and diffuse stenosis of the left popliteal artery. Scaler surgery was consulted with plans for surgery on Friday 9/30. Started on a heparin drip per pharmacy. Given 2 L bolus and started on vancomycin as well as pain control with Dilaudid. We are asked to admit.  Review of Systems: As per HPI; otherwise review of systems reviewed and negative.   Ambulatory Status:  Ambulates with cane or walker    Past Medical History:  Diagnosis Date   Anxiety    Back pain    Bipolar disorder ([place])    Borderline hypertension    Chronic pain    COPD (chronic obstructive pulmonary disease) (HCC)    Depression with anxiety    GERD (gastroesophageal reflux disease)    Headache    Hip pain    Hypercholesterolemia    Stroke ([place])    Tobacco use  Past Surgical History:  Procedure Laterality Date   ANTERIOR FUSION CERVICAL SPINE     BACK SURGERY     CHOLECYSTECTOMY     COLONOSCOPY N/A 10/28/2012   XTG:GYIRSW polyp-hyperplastic. Internal hemorrhoids. Distal 5 cm of terminal ileum appeared normal.   CORONARY ANGIOPLASTY WITH STENT PLACEMENT  1980s   ESOPHAGOGASTRODUODENOSCOPY N/A 10/28/2012   NIO:EVOJJKKX gastric mucosa with mottling and submucosal petechiae. Mild chronic gastritis but no H. pylori home path. Small hiatal hernia. Duodenal lipoma   EYE SURGERY   Left eye   as a child   LUMBAR SPINE SURGERY     3 x     Social History   Socioeconomic History   Marital status: Married    Spouse name: Not on file   Number of children: 4   Years of education: 11   Highest education level: Not on file  Occupational History   Occupation: Disabled  Tobacco Use   Smoking status: Every Day    Packs/day: 2.00    Years: 30.00    Pack years: 60.00    Types: Cigarettes   Smokeless tobacco: Never  Substance and Sexual Activity   Alcohol use: No    Alcohol/week: 0.0 standard drinks    Comment: history of ETOH abuse in past, none in 4 years   Drug use: No   Sexual activity: Not Currently  Other Topics Concern   Not on file  Social History Narrative   Lives at home alone.   12 pack of [place] Dew per day.   Right-handed.   Social Determinants of Health   Financial Resource Strain: Not on file  Food Insecurity: Not on file  Transportation Needs: Not on file  Physical Activity: Not on file  Stress: Not on file  Social Connections: Not on file  Intimate Partner Violence: Not on file    Allergies  Allergen Reactions   Penicillins Anaphylaxis   Latex    Morphine And Related    Vicodin [Hydrocodone-Acetaminophen]     Family History  Problem Relation Age of Onset   Stomach cancer Mother    Throat cancer Father    Colon cancer Neg Hx     Prior to Admission medications   Medication Sig Start Date End Date Taking? Authorizing Provider  albuterol (ACCUNEB) 0.63 MG/3ML nebulizer solution Take 1 ampule by nebulization every 6 (six) hours as needed for wheezing or shortness of breath. 10/13/12   [provider]  albuterol (PROVENTIL HFA;VENTOLIN HFA) 108 (90 BASE) MCG/ACT inhaler Inhale 2 puffs into the lungs every 6 (six) hours as needed for wheezing.    [provider]  allopurinol (ZYLOPRIM) 100 MG tablet Take 100 mg by mouth in the morning. 10/13/12   [provider]  aspirin 81 MG tablet Take 81 mg by mouth  daily.    [provider]  buPROPion (WELLBUTRIN XL) 150 MG 24 hr tablet Take 1 tablet by mouth daily at 6 (six) AM. 05/09/20   [provider]  diclofenac (VOLTAREN) 50 MG EC tablet Take 50 mg by mouth 3 (three) times daily. 02/07/20   [provider]  doxycycline (ADOXA) 100 MG tablet Take 100 mg by mouth 2 (two) times daily. 11/21/20   [provider]  gabapentin (NEURONTIN) 300 MG capsule Take 300 mg by mouth 3 (three) times daily.    [provider]  magnesium oxide (MAG-OX) 400 MG tablet Take 1 tablet by mouth daily. 11/27/20   [provider]  metFORMIN (GLUCOPHAGE) 500 MG  tablet Take 2 tablets by mouth 2 (two) times daily.    [provider]  metoprolol tartrate (LOPRESSOR) 25 MG tablet TAKE 1/2 TABLET BY MOUTH TWICE DAILY 11/16/20   Arnoldo Lenis, MD  omeprazole (PRILOSEC) 40 MG capsule Take 1 capsule by mouth daily at 6 (six) AM. 05/09/20   [provider]  ondansetron (ZOFRAN) 4 MG tablet Take 4 mg by mouth 3 (three) times daily as needed. 09/27/20   [provider]  oxyCODONE-acetaminophen (PERCOCET) 10-325 MG per tablet Take 1-2 tablets by mouth 4 (four) times daily as needed for pain. max FIVE PER DAY 09/24/12   [provider]  rosuvastatin (CRESTOR) 10 MG tablet Take 10 mg by mouth at bedtime. 12/31/19   [provider]  silver sulfADIAZINE (SILVADENE) 1 % cream Apply 1 application topically 2 (two) times daily. apply a 1/16 inch (1.5 mm) thick layer to entire burn area 11/21/20   [provider]  tamsulosin (FLOMAX) 0.4 MG CAPS capsule Take 0.4 mg by mouth in the morning.    [provider]    Physical Exam: Vitals:   12/06/20 1100 12/06/20 1115 12/06/20 1130 12/06/20 1145  BP: 116/71 98/84 125/63 (!) 145/104  Pulse: 73 73 78 92  Resp: 14 13 13 14   Temp:      TempSrc:      SpO2: 96% 96% 94% 96%  Weight:      Height:         General:  Appears calm and  comfortable and is in NAD Eyes:  PERRL, EOMI, normal lids, iris ENT:  grossly normal hearing, lips & tongue, mmm; no teeth.  Neck:  no LAD, masses or thyromegaly; no carotid bruits Cardiovascular:  RRR, no m/r/g. Mild LE pedal edema. R>L.  Respiratory:   CTA bilaterally with no wheezes/rales/rhonchi.  Normal respiratory effort. Abdomen:  soft, TTP over left lateral rib cage, ND, NABS Back:   normal alignment, no CVAT Skin:  black ulceration of right 5th metatarsal/tip of toe    Musculoskeletal:  grossly normal tone BUE/BLE, good ROM, no bony abnormality Lower extremity: RLE: see above.   Thready-1+ PT pulses, absent DP pulse on right  Psychiatric:  grossly normal mood and affect, speech fluent and appropriate, AOx3 Neurologic:  CN 2-12 grossly intact, moves all extremities in coordinated fashion, sensation intact    Radiological Exams on Admission: Independently reviewed - see discussion in A/P where applicable  DG Ribs Unilateral W/Chest Left  Result Date: 12/06/2020 CLINICAL DATA:  Fall, left rib pain EXAM: LEFT RIBS AND CHEST - 3+ VIEW COMPARISON:  CT chest dated 09/02/2013 FINDINGS: Lungs are clear.  No pleural effusion or pneumothorax. The heart is normal in size. No displaced left rib fracture is seen. Cervical spine fixation hardware. IMPRESSION: No displaced left rib fracture is seen. No evidence of acute cardiopulmonary disease. Electronically Signed   By: Julian Hy M.D.   On: 12/06/2020 00:30   CT ANGIO AO+BIFEM W & OR WO CONTRAST  Result Date: 12/06/2020 CLINICAL DATA:  Right fourth and 5th toe gangrene, Arterial embolism, lower extremity EXAM: CT ANGIOGRAPHY OF ABDOMINAL AORTA WITH ILIOFEMORAL RUNOFF TECHNIQUE: Multidetector CT imaging of the abdomen, pelvis and lower extremities was performed using the standard protocol during bolus administration of intravenous contrast. Multiplanar CT image reconstructions and MIPs were obtained to evaluate the vascular anatomy.  CONTRAST:  137m OMNIPAQUE IOHEXOL 350 MG/ML SOLN COMPARISON:  CTA abdomen pelvis 10/21/2012 FINDINGS: VASCULAR Aorta: Normal caliber. Moderate circumferential atheromatous plaque within  the a infrarenal abdominal aorta. No aneurysm. No hemodynamically significant stenosis. No dissection. No periaortic inflammatory change. Celiac: Unremarkable SMA: Unremarkable Renals: Single right and dual left renal arteries. Tiny accessory renal artery on the left supplies the lower pole. Wide patency of the renal vasculature. Normal vascular morphology. No aneurysm. IMA: High-grade focal stenosis (greater than 75%) at the origin. The vessel distally is diminutive, but patent. RIGHT Lower Extremity Inflow: There is thrombosis of the right common iliac artery likely related to insight to thrombosis with bulbous appearing clot seen extending inferiorly from the right common iliac vein to extend into the right internal iliac vein and protrude into the origin of the right external iliac vein. There are several small distal emboli noted within the right internal iliac artery. The right external iliac artery is diseased, but diminutive without hemodynamically significant focal stenosis. Outflow: There is bulbous eccentric mural thrombus within the right common femoral arterial bifurcation resulting in greater than 75% stenoses of the CFA at the takeoff of the anterior and posterior circumflex femoral arteries most in keeping with a distal embolus. The profundus femoral artery is patent but diminutive. The superficial femoral artery is heavily diseased with largely nonocclusive plaque and diminutive demonstrating roughly 50% stenosis throughout its course. Runoff: Atheromatous plaque results in a diffuse 50% stenosis of the popliteal artery. Relatively high takeoff of the right anterior tibial artery from the P2 segment of the popliteal artery the anterior tibial artery continues to form the dorsalis pedis artery. The peroneal artery  terminates at the level of the distal tibia diaphysis. The posterior tibial artery terminates within the mid right lower extremity. No distal reconstitution of the posterior tibial artery or plantar arch. LEFT Lower Extremity Inflow: Heavily disease without focal hemodynamically significant stenosis. Internal iliac artery is patent. Outflow: Focal 50% stenosis of the SFA at its origin. The profundus femoral artery is widely patent. The SFA distally is heavily disease with a focal 50-75% stenosis noted within the mid SFA. Runoff: Popliteal artery is moderately diseased with diffuse 50% stenosis. Variant takeoff of the anterior tibial artery from the P2 segment of the popliteal artery. Three-vessel runoff to the left foot. Dorsalis pedis and posterior tibial arteries are patent. Veins: No obvious venous abnormality within the limitations of this arterial phase study. Review of the MIP images confirms the above findings. NON-VASCULAR Lower chest: Visualized lung bases are clear. Visualized heart and pericardium are unremarkable. Hepatobiliary: No focal liver abnormality is seen. Status post cholecystectomy. No biliary dilatation. Pancreas: Unremarkable Spleen: Unremarkable Adrenals/Urinary Tract: The adrenal glands are unremarkable. The kidneys are normal. Streak artifact from right hip arthroplasty largely obscures the bladder, however, the visualized segment is unremarkable. Stomach/Bowel: Stomach, small bowel, and large bowel are unremarkable. Lymphatic: No pathologic adenopathy within the abdomen and pelvis. Reproductive: Prostate is unremarkable. Other: No abdominal wall hernia. Musculoskeletal: Right total hip arthroplasty has been performed. Degenerative changes are seen within the lumbar spine. L3-4 lumbar fusion with instrumentation has been performed. 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 Evidence of distal embolization with multiple emboli within the right internal iliac artery peripherally as well as within the right common femoral artery resulting in a greater than 75% stenosis of this vessel distally. Superimposed peripheral vascular disease with extensive disease of the right lower extremity outflow and runoff as described above with single-vessel runoff to the foot. Posterior tibial artery terminates at the  level of the mid diaphysis of the tibia but demonstrates a gradual washout and lack of significant associated plaque making this suspicious for occlusion secondary to distal embolization with poor collateralization. The posterior tibial artery distally and plantar arch are not reconstituted. Dorsalis pedis artery is patent. Extensive peripheral vascular disease with multifocal stenosis of the left SFA and diffuse stenosis of the left popliteal artery. Three-vessel runoff to the left foot with continuation of the dorsalis pedis artery and plantar arch. NON-VASCULAR None significant These results were called by telephone at the time of interpretation on 12/06/2020 at 4:03 am to provider [facility] , who verbally acknowledged these results. Electronically Signed   By: Fidela Salisbury M.D.   On: 12/06/2020 04:05   DG Foot Complete Right  Result Date: 12/06/2020 CLINICAL DATA:  Discoloration to right 4th and 5th digits EXAM: RIGHT FOOT COMPLETE - 3+ VIEW COMPARISON:  None. FINDINGS: No fracture or dislocation is seen. The joint spaces are preserved. No cortical irregularity involving the 4th or 5th digit to suggest acute osteomyelitis. Suspected soft tissue irregularity/ulcer along the lateral aspect of the 5th metacarpal head. IMPRESSION: Suspected soft tissue irregularity/ulcer along the lateral aspect of the 5th metacarpal head. No radiographic findings to suggest acute osteomyelitis. Electronically Signed   By: Julian Hy M.D.   On: 12/06/2020 00:39    EKG:  Independently reviewed.  NSR with rate 93; nonspecific ST changes with no evidence of acute ischemia   Labs on Admission: I have personally reviewed the available labs and imaging studies at the time of the admission.  Pertinent labs:  WBC: 17.7,  hemoglobin 11.4,  sodium 132 (1 29-1 33),  albumin 1.9,  alkaline phosphatase 146,  lactic acid 0.8 and repeat  0.9,  ESR 52,  CRP 13.5,  COVID and flu negative.   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 -Vascular surgery consulted with plans for surgery on Friday 9/30.  Per vascular would like to try a right iliofemoral thrombectomy and due to gangrene most likely require an infrainguinal bypass.  -Continue heparin drip and IV vancomycin -Holding aspirin -Pain medication with his daily oxycodone and severe breakthrough pain with IV Dilaudid -He wanted to leave AMA when I was admitting him however discussed that he could lose his limb without attempted revascularization or even worse die from infection.  He has decided to stay at this point. -SW consult for [facility] needs vs. Rehab depending on outcome of surgery   Active Problems: Leukocytosis -chronic vs. Reactive vs. Infectious due to wet gangrene. No other sepsis criteria.  -History of elevated WBC x3 years with a range of 10.9-24.1 -Continue IV vancomycin with plans for surgery for bypass vs. Amputation -Trend WBCs and may benefit from outpatient follow-up.    Gastroesophageal reflux disease Continue PPI, protonix while in patient per formulary     Chronic pain syndrome -continue home oxycodone 10-$RemoveBeforeDEI'325mg'[id number]$ . Fills correctly and takes as prescribed per pmp.     Type  2 diabetes mellitus without complication (HCC) -hold metformin while in hospital.  -no a1c in 3 years, this is pending, but appears well controlled off random CBG.  -SSI since BS <180, may need scheduled insulin after we observe his sugars for 24 hours.  COPD (chronic obstructive pulmonary disease) with chronic respiratory failure on home [place] prn  -no exacerbation, continue SABA prn -continue oxygen via [place], he has no idea how much he uses at home.     Anxiety and depression -continue wellbutrin -adding on klonopin prn for anxiety as he is wanting to leave AMA and is upset about being here.    Hyperlipidemia -continue crestor   Gout Continue allopurinol  Hx of CVA Continue crestor, hold ASA with surgery on Friday    Tobacco abuse  Nicotine patch and encouraged cessation   Body mass index is 21.44 kg/m.   Level of care: Med-Surg DVT prophylaxis:  heparin drip  Code Status:  Full - confirmed with patient Family Communication: daughter at bedside: Raymond [name]  Disposition Plan:  The patient is from: home  Anticipated d/c is to: per day team  Requires inpatient hospitalization and is at significant risk of worsening, requires constant monitoring, assessment and MDM with specialists.  Patient is currently: acutely ill Consults called: vascular surgery   Admission status:  inpatient   Dragon dictation used in completing this note.   Orma Flaming MD [facility]   How to contact the [facility] Attending or Consulting provider [place] or covering provider during after hours [place], for this patient?  Check the care team in [facility] and look for a) attending/consulting [facility] provider listed and b) the [facility] team listed Log into [email or web address] and use [place]'s universal password to access. If you do not have the password, please contact the hospital operator. Locate the [facility] provider you are looking for under [facility] and page to a number that you can be directly  reached. If you still have difficulty reaching the provider, please page the [facility] (Director on Call) for the Hospitalists listed on amion for assistance.   12/06/2020, 2:23 PM

## 2020-12-06 NOTE — ED Notes (Signed)
Pt given orange juice, crackers, and peanut butter.

## 2020-12-06 NOTE — ED Notes (Signed)
Gave pt a urinal to use the restroom due to coming in for fall

## 2020-12-06 NOTE — ED Notes (Signed)
Report given to [facility] 

## 2020-12-06 NOTE — ED Notes (Signed)
Pt returned from ct

## 2020-12-06 NOTE — ED Triage Notes (Signed)
Pt transferred here by [facility] for a wound to his right foot. Vascular consult due to poor circulation.

## 2020-12-06 NOTE — Progress Notes (Signed)
ANTICOAGULATION CONSULT NOTE   Pharmacy Consult for Heparin  Indication: Right common iliac artery thrombosis  Allergies  Allergen Reactions   Penicillins Anaphylaxis   Latex    Morphine And Related    Vicodin [Hydrocodone-Acetaminophen]     Patient Measurements: Height: 5\' 8"  (172.7 cm) Weight: 64 kg (141 lb) IBW/kg (Calculated) : 68.4  Vital Signs: Temp: 98.3 F (36.8 C) (09/28 0607) Temp Source: Oral (09/28 0607) BP: 145/104 (09/28 1145) Pulse Rate: 92 (09/28 1145)  Labs: Recent Labs    12/05/20 2321 12/06/20 1248  HGB 11.4*  --   HCT 35.0*  --   PLT 428*  --   HEPARINUNFRC  --  <0.10*  CREATININE 0.43*  --      Estimated Creatinine Clearance: 90 mL/min (A) (by C-G formula based on SCr of 0.43 mg/dL (L)).   Medical History: Past Medical History:  Diagnosis Date   Anxiety    Back pain    Bipolar disorder ([place])    Borderline hypertension    Chronic pain    COPD (chronic obstructive pulmonary disease) (HCC)    Depression with anxiety    GERD (gastroesophageal reflux disease)    Headache    Hip pain    Hypercholesterolemia    Stroke (HCC)    Tobacco use     Assessment: 60 y/o M with gangrenous foot, found to have right common iliac artery thrombosis per CT, starting heparin per pharmacy, Hgb 11.4, renal function is good, PTA meds reviewed.   Level returned undetectable at < 0.1  Goal of Therapy:  Heparin level 0.3-0.7 units/ml Monitor platelets by anticoagulation protocol: Yes   Plan: re-bolus Heparin 2000 units (~30u/kg) Increase heparin drip to 1200 units/hr (~3u/kg/hr from previous) F/u 6h HL Daily CBC/Heparin level Monitor for bleeding F/u vascular surgery consult/plan  Joetta Manners, PharmD, [facility] Emergency Medicine Clinical Pharmacist ED RPh Phone: [place]: [phone]

## 2020-12-06 NOTE — Consult Note (Addendum)
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  There also may be clot in the common femoral artery although it is somewhat difficult to assess because of his hip prosthesis which causes significant artifact on his CT scan.  It is difficult to determine if these findings are acute or chronic.  There is nothing on his history to suggest that this is acute but certainly this is possible.  He has multiple medical issues and I would recommend admission by the medical service with plans for open surgery on Friday.  I will likely try a right iliofemoral thrombectomy.  We can shoot an intraoperative arteriogram and I suspect that he will require angioplasty and stenting of the external and common iliac artery on the right.  In addition he will require femoral thrombectomy.  If this is not possible then the second option would be a left femoral to right femoral bypass.  He has some mild iliac disease on the left but a reasonable femoral pulse on the left.  In addition given the extensive gangrene on the lateral aspect of the right foot he would require most likely infrainguinal bypass (femoral to below-knee pop).  I will have his vein map.  In addition we could consider ray amputation of the right fifth toe in the same setting.  I have explained to the patient that this is clearly a limb threatening problem given his multilevel disease, diabetes, and smoking history (2 packs/day x 50 years).  Without attempted revascularization he will require a right above-the-knee amputation.  REASON FOR CONSULT:    Gangrene of the right foot with peripheral arterial disease  HPI:   Raymond [name] is a 60 y.o. male who was transferred from [facility] with gangrene of the right foot.  He underwent a CT angiogram there which showed occlusion of the  right common iliac artery and external iliac artery with also possible clot in the right common femoral artery.  I believe he must of received some sedation on transfer as I have a difficult time keeping him awake in order to obtain a history.  The best I can tell he developed a wound on his right foot after injuring it 3 weeks ago.  He does not describe any sudden onset of right lower extremity symptoms.  He has had a long history of right calf claudication.  Symptoms are brought on by ambulation and relieved with rest.  I do not get any clear-cut history of rest pain although again it is difficult to obtain history from.   His risk factors for peripheral vascular disease include diabetes, hypertension, hypercholesterolemia, a family history of premature cardiovascular disease, and tobacco use.  He smokes 2 packs/day and has been smoking for 50 years.   Of note in [facility] he was found to eating at 5:20 AM although he had been told not to as he was being considered for surgery.  Past Medical History:  Diagnosis Date   Anxiety    Back pain    Bipolar disorder ([place])    Borderline hypertension    Chronic pain    COPD (chronic obstructive pulmonary disease) (HCC)    Depression with anxiety    GERD (gastroesophageal reflux disease)    Headache    Hip pain    Hypercholesterolemia    Stroke ([place])  Tobacco use     Family History  Problem Relation Age of Onset   Stomach cancer Mother    Throat cancer Father    Colon cancer Neg Hx     SOCIAL HISTORY: Social History   Tobacco Use   Smoking status: Every Day    Packs/day: 2.00    Years: 30.00    Pack years: 60.00    Types: Cigarettes   Smokeless tobacco: Never  Substance Use Topics   Alcohol use: No    Alcohol/week: 0.0 standard drinks    Comment: history of ETOH abuse in past, none in 4 years    Allergies  Allergen Reactions   Penicillins Anaphylaxis   Latex    Morphine And Related    Vicodin [Hydrocodone-Acetaminophen]      Current Facility-Administered Medications  Medication Dose Route Frequency Provider Last Rate Last Admin   heparin ADULT infusion 100 units/mL (25000 units/297mL)  1,000 Units/hr Intravenous Continuous Erenest Blank, RPH 10 mL/hr at 12/06/20 0424 1,000 Units/hr at 12/06/20 0424   LORazepam (ATIVAN) injection 1 mg  1 mg Intravenous Q1H PRN Mesner, Jason, MD   1 mg at 12/06/20 0511   nicotine (NICODERM CQ - dosed in mg/24 hours) patch 21 mg  21 mg Transdermal Once Mesner, Corene Cornea, MD   21 mg at 12/06/20 0207   vancomycin (VANCOCIN) IVPB 1000 mg/200 mL premix  1,000 mg Intravenous Q12H Pham, Minh Q, RPH-CPP       Current Outpatient Medications  Medication Sig Dispense Refill   albuterol (PROVENTIL HFA;VENTOLIN HFA) 108 (90 BASE) MCG/ACT inhaler Inhale 2 puffs into the lungs every 6 (six) hours as needed for wheezing.     albuterol (PROVENTIL) (2.5 MG/3ML) 0.083% nebulizer solution as needed.     allopurinol (ZYLOPRIM) 100 MG tablet Take 100 mg by mouth daily.      aspirin 81 MG tablet Take 81 mg by mouth daily.     buPROPion (WELLBUTRIN XL) 150 MG 24 hr tablet Take 1 tablet by mouth daily at 6 (six) AM.     diclofenac (VOLTAREN) 50 MG EC tablet Take 50 mg by mouth 3 (three) times daily.     gabapentin (NEURONTIN) 300 MG capsule Take 300 mg by mouth 3 (three) times daily.     metFORMIN (GLUCOPHAGE) 500 MG tablet Take 2 tablets by mouth 2 (two) times daily.     metoprolol tartrate (LOPRESSOR) 25 MG tablet TAKE 1/2 TABLET BY MOUTH TWICE DAILY 90 tablet 0   omeprazole (PRILOSEC) 40 MG capsule Take 1 capsule by mouth daily at 6 (six) AM.     oxyCODONE-acetaminophen (PERCOCET) 10-325 MG per tablet Take 1-2 tablets by mouth every 6 (six) hours as needed for pain.      rosuvastatin (CRESTOR) 10 MG tablet Take 10 mg by mouth daily.     tamsulosin (FLOMAX) 0.4 MG CAPS capsule Take 0.4 mg by mouth daily after supper.      REVIEW OF SYSTEMS:  [X]  denotes positive finding, [ ]  denotes negative  finding Cardiac  Comments:  Chest pain or chest pressure:    Shortness of breath upon exertion: x   Short of breath when lying flat:    Irregular heart rhythm:        Vascular    Pain in calf, thigh, or hip brought on by ambulation: x Right calf  Pain in feet at night that wakes you up from your sleep:     Blood clot in your veins:    Leg  swelling:         Pulmonary    Oxygen at home:    Productive cough:     Wheezing:         Neurologic    Sudden weakness in arms or legs:     Sudden numbness in arms or legs:     Sudden onset of difficulty speaking or slurred speech:    Temporary loss of vision in one eye:     Problems with dizziness:         Gastrointestinal    Blood in stool:     Vomited blood:         Genitourinary    Burning when urinating:     Blood in urine:        Psychiatric    Major depression:         Hematologic    Bleeding problems:    Problems with blood clotting too easily:        Skin    Rashes or ulcers:        Constitutional    Fever or chills:    -  PHYSICAL EXAM:   Vitals:   12/06/20 0421 12/06/20 0509 12/06/20 0510 12/06/20 0607  BP: 125/68 140/75  127/75  Pulse: 81 82 86 88  Resp: 18  11 18   Temp:    98.3 F (36.8 C)  TempSrc:    Oral  SpO2: 93% 94% 91% 94%  Weight:      Height:       Body mass index is 21.44 kg/m. GENERAL: The patient is a well-nourished male, in no acute distress. The vital signs are documented above. CARDIAC: There is a regular rate and rhythm.  VASCULAR: I do not detect carotid bruits. On the right side, which is the symptomatic side, I cannot palpate a femoral, popliteal, or pedal pulses.  He does have a monophasic dorsalis pedis signal on the right with a Doppler. On the left side he has a palpable femoral pulse.  I cannot palpate a popliteal or pedal pulses.  He has a fairly brisk posterior tibial signal with a Doppler a monophasic dorsalis pedis signal on the left. PULMONARY: There is good air exchange  bilaterally without wheezing or rales. ABDOMEN: Soft and non-tender with normal pitched bowel sounds.  I do not palpate an aneurysm. MUSCULOSKELETAL: There are no major deformities. NEUROLOGIC: No focal weakness or paresthesias are detected. SKIN: He has dry gangrene involving the lateral aspect of his right foot and gangrene of the right fifth toe as documented in the photograph below.   PSYCHIATRIC: The patient has a normal affect.  DATA:    CT ANGIOGRAM: On my review the images of his CT angiogram there is occlusion of the common iliac artery which was somewhat ectatic.  This occlusion extends into the external iliac artery and the hypogastric artery.  This could potentially represent clot.  He also appears to potentially have clot in the common femoral artery.  There is significant artifact related to his hip replacement.  Below that he appears to have moderate diffuse disease of the superficial femoral artery.  The tibials are patent on the right.  On the left side the common and external iliac arteries have mild diffuse disease.  There is no significant occlusive disease in the infrarenal aorta.  LABS:   COVID is negative.  His GFR is greater than 60.  Creatinine is 0.43.  White blood cell count 17.7.  Hemoglobin 11.4.  Hematocrit 35.  Platelets 128,000.   [name] [facility]

## 2020-12-07 DIAGNOSIS — F419 Anxiety disorder, unspecified: Secondary | ICD-10-CM

## 2020-12-07 DIAGNOSIS — I70261 Atherosclerosis of native arteries of extremities with gangrene, right leg: Secondary | ICD-10-CM | POA: Diagnosis not present

## 2020-12-07 DIAGNOSIS — Z87891 Personal history of nicotine dependence: Secondary | ICD-10-CM | POA: Diagnosis not present

## 2020-12-07 DIAGNOSIS — I739 Peripheral vascular disease, unspecified: Secondary | ICD-10-CM

## 2020-12-07 DIAGNOSIS — Z7982 Long term (current) use of aspirin: Secondary | ICD-10-CM | POA: Diagnosis not present

## 2020-12-07 DIAGNOSIS — I70231 Atherosclerosis of native arteries of right leg with ulceration of thigh: Secondary | ICD-10-CM | POA: Diagnosis not present

## 2020-12-07 DIAGNOSIS — J449 Chronic obstructive pulmonary disease, unspecified: Secondary | ICD-10-CM

## 2020-12-07 DIAGNOSIS — I745 Embolism and thrombosis of iliac artery: Secondary | ICD-10-CM | POA: Diagnosis not present

## 2020-12-07 DIAGNOSIS — Z720 Tobacco use: Secondary | ICD-10-CM

## 2020-12-07 DIAGNOSIS — L97909 Non-pressure chronic ulcer of unspecified part of unspecified lower leg with unspecified severity: Secondary | ICD-10-CM

## 2020-12-07 DIAGNOSIS — Z7984 Long term (current) use of oral hypoglycemic drugs: Secondary | ICD-10-CM | POA: Diagnosis not present

## 2020-12-07 DIAGNOSIS — I70229 Atherosclerosis of native arteries of extremities with rest pain, unspecified extremity: Secondary | ICD-10-CM

## 2020-12-07 DIAGNOSIS — G894 Chronic pain syndrome: Secondary | ICD-10-CM | POA: Diagnosis not present

## 2020-12-07 DIAGNOSIS — F32A Depression, unspecified: Secondary | ICD-10-CM

## 2020-12-07 DIAGNOSIS — I96 Gangrene, not elsewhere classified: Secondary | ICD-10-CM

## 2020-12-07 DIAGNOSIS — E119 Type 2 diabetes mellitus without complications: Secondary | ICD-10-CM

## 2020-12-07 LAB — HEPARIN LEVEL (UNFRACTIONATED)
Heparin Unfractionated: 0.21 IU/mL — ABNORMAL LOW (ref 0.30–0.70)
Heparin Unfractionated: 0.27 IU/mL — ABNORMAL LOW (ref 0.30–0.70)

## 2020-12-07 LAB — COMPREHENSIVE METABOLIC PANEL
ALT: 13 U/L (ref 0–44)
AST: 13 U/L — ABNORMAL LOW (ref 15–41)
Albumin: 1.6 g/dL — ABNORMAL LOW (ref 3.5–5.0)
Alkaline Phosphatase: 136 U/L — ABNORMAL HIGH (ref 38–126)
Anion gap: 7 (ref 5–15)
BUN: 5 mg/dL — ABNORMAL LOW (ref 6–20)
CO2: 27 mmol/L (ref 22–32)
Calcium: 7.9 mg/dL — ABNORMAL LOW (ref 8.9–10.3)
Chloride: 101 mmol/L (ref 98–111)
Creatinine, Ser: 0.83 mg/dL (ref 0.61–1.24)
GFR, Estimated: >60 mL/min (ref >60)
Glucose, Bld: 72 mg/dL (ref 70–99)
Potassium: 4.2 mmol/L (ref 3.5–5.1)
Sodium: 135 mmol/L (ref 135–145)
Total Bilirubin: 0.1 mg/dL — ABNORMAL LOW (ref 0.3–1.2)
Total Protein: 4.8 g/dL — ABNORMAL LOW (ref 6.5–8.1)

## 2020-12-07 LAB — GLUCOSE, CAPILLARY
Glucose-Capillary: 94 mg/dL (ref 70–99)
Glucose-Capillary: 106 mg/dL — ABNORMAL HIGH (ref 70–99)
Glucose-Capillary: 134 mg/dL — ABNORMAL HIGH (ref 70–99)
Glucose-Capillary: 131 mg/dL — ABNORMAL HIGH (ref 70–99)

## 2020-12-07 LAB — CBC
HCT: 35.1 % — ABNORMAL LOW (ref 39.0–52.0)
Hemoglobin: 10.9 g/dL — ABNORMAL LOW (ref 13.0–17.0)
MCH: 26.3 pg (ref 26.0–34.0)
MCHC: 31.1 g/dL (ref 30.0–36.0)
MCV: 84.6 fL (ref 80.0–100.0)
Platelets: 416 10*3/uL — ABNORMAL HIGH (ref 150–400)
RBC: 4.15 MIL/uL — ABNORMAL LOW (ref 4.22–5.81)
RDW: 17.1 % — ABNORMAL HIGH (ref 11.5–15.5)
WBC: 15.6 10*3/uL — ABNORMAL HIGH (ref 4.0–10.5)
nRBC: 0 % (ref 0.0–0.2)

## 2020-12-07 MED ORDER — HEPARIN BOLUS VIA INFUSION
2000.0000 [IU] | Freq: Once | INTRAVENOUS | Status: AC
  Administered 2020-12-07: 2000 [IU] via INTRAVENOUS

## 2020-12-07 MED ORDER — HEPARIN (PORCINE) 25000 UT/250ML-% IV SOLN
1850.0000 [IU]/h | INTRAVENOUS | Status: DC
  Administered 2020-12-07: 1450 [IU]/h via INTRAVENOUS
  Administered 2020-12-08: 1700 [IU]/h via INTRAVENOUS
  Filled 2020-12-07: qty 250

## 2020-12-07 MED ORDER — OXYCODONE-ACETAMINOPHEN 5-325 MG PO TABS
1.0000 | ORAL_TABLET | Freq: Four times a day (QID) | ORAL | Status: DC | PRN
  Administered 2020-12-07 – 2020-12-08 (×2): 1 via ORAL
  Filled 2020-12-07 (×2): qty 1

## 2020-12-07 MED ORDER — HYDROMORPHONE HCL 1 MG/ML IJ SOLN
0.5000 mg | INTRAMUSCULAR | Status: DC | PRN
  Administered 2020-12-07 – 2020-12-12 (×10): 0.5 mg via INTRAVENOUS
  Filled 2020-12-07 (×10): qty 1

## 2020-12-07 MED ORDER — OXYCODONE HCL 5 MG PO TABS: 5.0000 mg | ORAL_TABLET | Freq: Four times a day (QID) | ORAL | Status: DC | PRN

## 2020-12-07 NOTE — Progress Notes (Signed)
ANTICOAGULATION CONSULT NOTE  Pharmacy Consult for Heparin Indication:  Right common/iliac artery thrombosis  Allergies  Allergen Reactions   Morphine And Related Shortness Of Breath and Palpitations   Penicillins Anaphylaxis   Vicodin [Hydrocodone-Acetaminophen] Shortness Of Breath and Palpitations   Latex Hives and Rash    Patient Measurements: Height: 5\' 8"  (172.7 cm) Weight: 64 kg (141 lb) IBW/kg (Calculated) : 68.4  Heparin Dosing Weight: 64 kg  Vital Signs: Temp: 98.4 F (36.9 C) (09/29 0715) Temp Source: Oral (09/29 0715) BP: 103/76 (09/29 0715) Pulse Rate: 85 (09/29 0715)  Labs: Recent Labs    12/05/20 2321 12/06/20 1248 12/06/20 2000 12/07/20 0149 12/07/20 0712  HGB 11.4*  --   --  10.9*  --   HCT 35.0*  --   --  35.1*  --   PLT 428*  --   --  416*  --   HEPARINUNFRC  --  <0.10* <0.10*  --  0.21*  CREATININE 0.43*  --   --  0.83  --     Estimated Creatinine Clearance: 86.7 mL/min (by C-G formula based on SCr of 0.83 mg/dL).   Medications:  Scheduled:   allopurinol  100 mg Oral q AM   buPROPion  150 mg Oral Q0600   gabapentin  300 mg Oral TID   insulin aspart  0-9 Units Subcutaneous TID WC   magnesium oxide  400 mg Oral Daily   metoprolol tartrate  12.5 mg Oral BID   nicotine  21 mg Transdermal Daily   pantoprazole  80 mg Oral Daily   rosuvastatin  10 mg Oral QHS   sodium chloride flush  3 mL Intravenous Q12H   tamsulosin  0.4 mg Oral q AM   Infusions:   sodium chloride     heparin 1,450 Units/hr (12/07/20 0920)   vancomycin 1,000 mg (12/07/20 0241)    Assessment:  60 y/o M with gangrenous foot, found to have right common iliac artery thrombosis per CT, starting heparin per pharmacy.  Heparin level remains subtherapeutic at 0.21.  No issue with infusion, IV site nor bleeding per chart review and discussion w/ RN. Will re-bolus and increase rate.   Per vascular surgery, heparin to be held 9/30 0800 for pending OR procedure. End time entered  into heparin order.  Goal of Therapy:  Heparin level 0.3-0.7 units/ml Monitor platelets by anticoagulation protocol: Yes   Plan:  Give 2000 units bolus x 1 Increase heparin infusion to 1600 units/hr Check heparin level in 6 hours and daily while on heparin Continue to monitor H&H and platelets   Thank you for allowing pharmacy to be a part of this patient's care.  Ardyth Harps, PharmD

## 2020-12-07 NOTE — Progress Notes (Signed)
ANTICOAGULATION CONSULT NOTE  Pharmacy Consult for Heparin Indication:  Right common/iliac artery thrombosis  Allergies  Allergen Reactions   Morphine And Related Shortness Of Breath and Palpitations   Penicillins Anaphylaxis   Vicodin [Hydrocodone-Acetaminophen] Shortness Of Breath and Palpitations   Latex Hives and Rash    Patient Measurements: Height: 5\' 8"  (172.7 cm) Weight: 64 kg (141 lb) IBW/kg (Calculated) : 68.4  Heparin Dosing Weight: 64 kg  Vital Signs: Temp: 98.6 F (37 C) (09/29 1241) Temp Source: Oral (09/29 1241) BP: 123/68 (09/29 1241) Pulse Rate: 65 (09/29 1241)  Labs: Recent Labs    12/05/20 2321 12/06/20 1248 12/06/20 2000 12/07/20 0149 12/07/20 0712 12/07/20 1637  HGB 11.4*  --   --  10.9*  --   --   HCT 35.0*  --   --  35.1*  --   --   PLT 428*  --   --  416*  --   --   HEPARINUNFRC  --    < > <0.10*  --  0.21* 0.27*  CREATININE 0.43*  --   --  0.83  --   --    < > = values in this interval not displayed.     Estimated Creatinine Clearance: 86.7 mL/min (by C-G formula based on SCr of 0.83 mg/dL).   Medications:  Scheduled:   allopurinol  100 mg Oral q AM   buPROPion  150 mg Oral Q0600   gabapentin  300 mg Oral TID   insulin aspart  0-9 Units Subcutaneous TID WC   magnesium oxide  400 mg Oral Daily   metoprolol tartrate  12.5 mg Oral BID   nicotine  21 mg Transdermal Daily   pantoprazole  80 mg Oral Daily   rosuvastatin  10 mg Oral QHS   sodium chloride flush  3 mL Intravenous Q12H   tamsulosin  0.4 mg Oral q AM   Infusions:   sodium chloride     heparin 1,600 Units/hr (12/07/20 1252)   vancomycin 1,000 mg (12/07/20 1251)    Assessment:  60 y/o M with gangrenous foot, found to have right common iliac artery thrombosis per CT, starting heparin per pharmacy.  Heparin level 0.27 (on heparin 1600 units/hr) No signs/symptoms of bleeding  Per vascular surgery, heparin to be held 9/30 0800 for pending OR procedure. End time entered  into heparin order.  Goal of Therapy:  Heparin level 0.3-0.7 units/ml Monitor platelets by anticoagulation protocol: Yes   Plan:  Increase to heparin 1700 units/hr (26.5 units/kg/hr) Check heparin level in 6 hours and daily while on heparin Consider obtaining an anti-thrombin level as patient is on high doses of heparin if continued post procedure Continue to monitor H&H and platelets   Thank you for allowing pharmacy to be a part of this patient's care.  Donnald Garre, PharmD Clinical Pharmacist  Please check AMION for all [facility] numbers After 10:00 PM, call [place] [phone]

## 2020-12-07 NOTE — Progress Notes (Signed)
VASCULAR SURGERY ASSESSMENT & PLAN:   GANGRENE RIGHT FOOT WITH CRITICAL LIMB ISCHEMIA: Without revascularization the patient would require a right above-the-knee amputation.  I think his best chance for limb salvage is attempted revascularization and ray amputation of the right fifth and possibly fourth toes.  My plan is a right femoral endarterectomy with angioplasty and stenting of the right common and external iliac artery.  Alternatively he would require a left to right femoral-femoral bypass.  In addition, given the extent of the wound on the foot he will require infrainguinal bypass.  I plan on a right femoral to below-knee popliteal artery bypass.  His vein map shows he does not have adequate vein for a vein bypass and this will likely be done with a prosthetic graft.  Given the extent of his disease and the extent of the wound he is at risk for limb loss and we have discussed this multiple times.  I have discussed the indications for the procedure and the potential complications and he is agreeable to proceed.  All of his questions were answered  ID: He is on intravenous vancomycin.  Would consider adding gram-negative coverage.  ANTICOAGULATION: He is on intravenous heparin per pharmacy.  I have written to stop this at 8 AM in anticipation of surgery tomorrow.  SUBJECTIVE:   No specific complaints this morning.  PHYSICAL EXAM:   Vitals:   12/06/20 1954 12/06/20 2358 12/07/20 0403 12/07/20 0715  BP: 119/76 92/70 103/74 103/76  Pulse: 87 82 87 85  Resp: 17 18 20 19   Temp: 98.6 F (37 C) 99 F (37.2 C) 98.1 F (36.7 C) 98.4 F (36.9 C)  TempSrc: Oral Axillary Oral Oral  SpO2: 99% 97% 93% 97%  Weight:      Height:       No change in the dry gangrene of his right lateral foot and right fifth toe.  He has persistent cellulitis on the foot.  LABS:   VEIN MAP: Vein map shows that both great saphenous veins and small saphenous veins are small and likely not adequate for a bypass  conduit.  Lab Results  Component Value Date   WBC 15.6 (H) 12/07/2020   HGB 10.9 (L) 12/07/2020   HCT 35.1 (L) 12/07/2020   MCV 84.6 12/07/2020   PLT 416 (H) 12/07/2020   Lab Results  Component Value Date   CREATININE 0.83 12/07/2020   Lab Results  Component Value Date   INR 1.04 01/22/2009   CBG (last 3)  Recent Labs    12/06/20 0738 12/06/20 1712  GLUCAP 80 98    PROBLEM LIST:    Principal Problem:   Occlusion of right iliac artery (HCC) Active Problems:   Gastroesophageal reflux disease   Chronic pain syndrome   Type 2 diabetes mellitus without complication (HCC)   COPD (chronic obstructive pulmonary disease) (HCC)   Anxiety and depression   Hyperlipidemia   Tobacco abuse   Peripheral vascular disease of lower extremity with ulceration (HCC)   Dry gangrene (HCC)   Chronic respiratory failure (HCC)   CURRENT MEDS:    allopurinol  100 mg Oral q AM   buPROPion  150 mg Oral Q0600   gabapentin  300 mg Oral TID   insulin aspart  0-9 Units Subcutaneous TID WC   magnesium oxide  400 mg Oral Daily   metoprolol tartrate  12.5 mg Oral BID   nicotine  21 mg Transdermal Daily   pantoprazole  80 mg Oral Daily  rosuvastatin  10 mg Oral QHS   sodium chloride flush  3 mL Intravenous Q12H   tamsulosin  0.4 mg Oral q AM    [name] Office: [phone] 12/07/2020

## 2020-12-07 NOTE — Progress Notes (Signed)
PROGRESS NOTE  Raymond [name] MGQ:[id number] DOB: 1960-06-24   PCP: [name], MD  Patient is from: Home.  Lives alone.  Uses rolling walker at baseline.  DOA: 12/05/2020 LOS: 1  Chief complaints:  Chief Complaint  Patient presents with   Fall   Wound Check    Wound check-toes on right foot     Brief Narrative / Interim history: 60 year old M with PMH of COPD, DM-2 with neuropathy, chronic pain on oxycodone, anxiety, depression, HLD, gout and tobacco use disorder (2 PPD) presenting with increased right foot pain and ulcer, and admitted with critical right limb ischemia due to common iliac artery thrombosis extending into external and internal with distal embolize and multivessel PAD in right lower extremity.  He also had leukocytosis and mild tachycardia.  He was started on IV heparin and IV vancomycin.  Vascular surgery consulted and planning thrombectomy and angiography with possible stenting over 9/30.  Subjective: Seen and examined earlier this morning.  Complains pain in his right foot.  Rates his pain as severe.  He denies chest pain, dyspnea, GI or UTI symptoms.  Objective: Vitals:   12/07/20 0403 12/07/20 0715 12/07/20 1216 12/07/20 1241  BP: 103/74 103/76 96/65 123/68  Pulse: 87 85 80 65  Resp: 20 19 (!) 21 19  Temp: 98.1 F (36.7 C) 98.4 F (36.9 C) 99.1 F (37.3 C) 98.6 F (37 C)  TempSrc: Oral Oral Oral Oral  SpO2: 93% 97% 91% 93%  Weight:      Height:        Intake/Output Summary (Last 24 hours) at 12/07/2020 1612 Last data filed at 12/07/2020 1610 Gross per 24 hour  Intake 582.61 ml  Output 4125 ml  Net -3542.39 ml   Filed Weights   12/05/20 2150  Weight: 64 kg    Examination:  GENERAL: No apparent distress.  Nontoxic. HEENT: MMM.  Vision and hearing grossly intact.  NECK: Supple.  No apparent JVD.  RESP: On RA.  No IWOB.  Fair aeration bilaterally. CVS:  RRR. Heart sounds normal.  ABD/GI/GU: BS+. Abd soft, NTND.  MSK/EXT:  Moves extremities.   Erythema over the dorsal aspect of right foot with gangrenous ulcer laterally.  Not able to palpate DP pulse and right foot.  2+ DP pulses in left foot SKIN: As above NEURO: Awake, alert and oriented appropriately.  No apparent focal neuro deficit. PSYCH: Calm. Normal affect.   Procedures:  None  Microbiology summarized: [id number] and influenza PCR nonreactive.  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 -Bowel regimen -Continue gabapentin  Chronic COPD: Stable.  On RA. -Continue inhalers and as needed nebulizers -Encourage smoking cessation  Controlled DM-2: A1c 5.5%.  On metformin at home. Recent Labs  Lab 12/06/20 0738 12/06/20 1712 12/07/20 0837 12/07/20 1057 12/07/20 1608  GLUCAP 80 98 94 106* 134*  -May discontinue CBG monitoring and HHS if CBG remains stable -Continue statin.  Chronic pain syndrome -Pain management as above  GERD -Continue Protonix  Leukocytosis: Suspect this to be demargination versus infection -Continue monitoring  Tobacco use disorder: About 2 packs a day. -Encourage cessation -Nicotine patch -  Already on Wellbutrin  Anxiety and depression: Stable. -Continue home meds   Body mass index is 21.44 kg/m.         DVT prophylaxis:    On heparin drip  Code Status: Full code Family Communication: Patient and/or RN. Available if any question.  Level of care: Med-Surg Status is: Inpatient    Dispo: The patient is from: Home              Anticipated d/c is to:  To be determined               Patient currently is not medically stable to d/c.   Difficult to place patient No       Consultants:  Vascular surgery   Sch Meds:  Scheduled Meds:  allopurinol  100 mg Oral q AM   buPROPion  150 mg Oral Q0600   gabapentin  300 mg Oral TID   insulin aspart  0-9 Units Subcutaneous TID WC   magnesium oxide  400 mg Oral Daily   metoprolol tartrate  12.5 mg Oral BID   nicotine  21 mg Transdermal Daily   pantoprazole  80 mg Oral Daily   rosuvastatin  10 mg Oral QHS   sodium chloride flush  3 mL Intravenous Q12H   tamsulosin  0.4 mg Oral q AM   Continuous Infusions:  sodium chloride     heparin 1,600 Units/hr (12/07/20 1252)   vancomycin 1,000 mg (12/07/20 1251)   PRN Meds:.sodium chloride, acetaminophen **OR** acetaminophen, albuterol, calcium carbonate, clonazePAM, hydrALAZINE, HYDROmorphone (DILAUDID) injection, ondansetron **OR** ondansetron (ZOFRAN) IV, oxyCODONE-acetaminophen **AND** oxyCODONE, senna-docusate, sodium chloride flush  Antimicrobials: Anti-infectives (From admission, onward)    Start     Dose/Rate Route Frequency Ordered Stop   12/06/20 1300  vancomycin (VANCOCIN) IVPB 1000 mg/200 mL premix        1,000 mg 200 mL/hr over 60 Minutes Intravenous Every 12 hours 12/06/20 0137     12/06/20 0100  vancomycin (VANCOREADY) IVPB 1250 mg/250 mL        1,250 mg 166.7 mL/hr over 90 Minutes Intravenous  Once 12/06/20 0048 12/06/20 0358        I have personally reviewed the following labs and images: CBC: Recent Labs  Lab 12/05/20 2321 12/07/20 0149  WBC 17.7* 15.6*  NEUTROABS 12.8*  --   HGB 11.4* 10.9*  HCT 35.0* 35.1*  MCV 84.1 84.6  PLT 428* 416*   BMP &GFR Recent Labs  Lab 12/05/20 2321 12/07/20 0149  NA 132* 135  K 3.9 4.2  CL 98 101  CO2 29 27  GLUCOSE 104* 72  BUN 7 5*  CREATININE 0.43* 0.83  CALCIUM 7.7* 7.9*   Estimated Creatinine Clearance: 86.7 mL/min (by C-G formula based on SCr of 0.83 mg/dL). Liver & Pancreas: Recent  Labs  Lab 12/05/20 2321 12/07/20 0149  AST 12* 13*  ALT 15 13  ALKPHOS 146* 136*  BILITOT 0.2* 0.1*  PROT 5.4* 4.8*  ALBUMIN 1.9* 1.6*   No results for input(s): LIPASE, AMYLASE in the last 168 hours. No results for input(s): AMMONIA in the last 168 hours. Diabetic: Recent Labs    12/06/20 1958  HGBA1C 5.5   Recent Labs  Lab 12/06/20 0738 12/06/20 1712 12/07/20 0837 12/07/20 1057 12/07/20 1608  GLUCAP 80 98 94 106* 134*   Cardiac Enzymes: No results for input(s): CKTOTAL, CKMB, CKMBINDEX, TROPONINI in the last 168 hours. No results for input(s): PROBNP in the last 8760 hours. Coagulation Profile: No results for  input(s): INR, PROTIME in the last 168 hours. Thyroid Function Tests: No results for input(s): TSH, T4TOTAL, FREET4, T3FREE, THYROIDAB in the last 72 hours. Lipid Profile: No results for input(s): CHOL, HDL, LDLCALC, TRIG, CHOLHDL, LDLDIRECT in the last 72 hours. Anemia Panel: No results for input(s): VITAMINB12, FOLATE, FERRITIN, TIBC, IRON, RETICCTPCT in the last 72 hours. Urine analysis:    Component Value Date/Time   COLORURINE YELLOW 11/11/2009 1455   APPEARANCEUR CLEAR 11/11/2009 1455   LABSPEC <1.005 (L) 11/11/2009 1455   PHURINE 6.5 11/11/2009 1455   GLUCOSEU NEGATIVE 11/11/2009 1455   HGBUR NEGATIVE 11/11/2009 1455   BILIRUBINUR NEGATIVE 11/11/2009 1455   KETONESUR NEGATIVE 11/11/2009 1455   PROTEINUR NEGATIVE 11/11/2009 1455   UROBILINOGEN 0.2 11/11/2009 1455   NITRITE NEGATIVE 11/11/2009 1455   LEUKOCYTESUR  11/11/2009 1455    NEGATIVE MICROSCOPIC NOT DONE ON URINES WITH NEGATIVE PROTEIN, BLOOD, LEUKOCYTES, NITRITE, OR GLUCOSE <1000 mg/dL.   Sepsis Labs: Invalid input(s): PROCALCITONIN, [place]  Microbiology: Recent Results (from the past 240 hour(s))  Resp Panel by RT-PCR (Flu A&B, Covid) Nasopharyngeal Swab     Status: None   Collection Time: 12/06/20  4:32 AM   Specimen: Nasopharyngeal Swab; Nasopharyngeal(NP) swabs in vial  transport medium  Result Value Ref Range Status   SARS Coronavirus 2 by RT PCR NEGATIVE NEGATIVE Final    Comment: (NOTE) SARS-CoV-2 target nucleic acids are NOT DETECTED.  The SARS-CoV-2 RNA is generally detectable in upper respiratory specimens during the acute phase of infection. The lowest concentration of SARS-CoV-2 viral copies this assay can detect is 138 copies/mL. A negative result does not preclude SARS-Cov-2 infection and should not be used as the sole basis for treatment or other patient management decisions. A negative result may occur with  improper specimen collection/handling, submission of specimen other than nasopharyngeal swab, presence of viral mutation(s) within the areas targeted by this assay, and inadequate number of viral copies(<138 copies/mL). A negative result must be combined with clinical observations, patient history, and epidemiological information. The expected result is Negative.  Fact Sheet for Patients:  EntrepreneurPulse.com.au  Fact Sheet for Healthcare Providers:  IncredibleEmployment.be  This test is no t yet approved or cleared by the [place] FDA and  has been authorized for detection and/or diagnosis of SARS-CoV-2 by FDA under an Emergency Use Authorization (EUA). This EUA will remain  in effect (meaning this test can be used) for the duration of the COVID-19 declaration under Section 564(b)(1) of the Act, 21 U.S.C.section 360bbb-3(b)(1), unless the authorization is terminated  or revoked sooner.       Influenza A by PCR NEGATIVE NEGATIVE Final   Influenza B by PCR NEGATIVE NEGATIVE Final    Comment: (NOTE) The Xpert Xpress SARS-CoV-2/FLU/RSV plus assay is intended as an aid in the diagnosis of influenza from Nasopharyngeal swab specimens and should not be used as a sole basis for treatment. Nasal washings and aspirates are unacceptable for Xpert Xpress SARS-CoV-2/FLU/RSV testing.  Fact  Sheet for Patients: EntrepreneurPulse.com.au  Fact Sheet for Healthcare Providers: IncredibleEmployment.be  This test is not yet approved or cleared by the [place] FDA and has been authorized for detection and/or diagnosis of SARS-CoV-2 by FDA under an Emergency Use Authorization (EUA). This EUA will remain in effect (meaning this test can be used) for the duration of the COVID-19 declaration under Section 564(b)(1) of the Act, 21 U.S.C. section 360bbb-3(b)(1), unless the authorization is terminated or revoked.  Performed at [facility], [address] [place], [place] [address]  Radiology Studies: No results found.    Praneel Haisley T. Abanda  If 7PM-7AM, please contact night-coverage [email or web address] 12/07/2020, 4:12 PM

## 2020-12-07 NOTE — Plan of Care (Signed)
  Problem: Education: Goal: Knowledge of General Education information will improve Description: Including pain rating scale, medication(s)/side effects and non-pharmacologic comfort measures Outcome: Progressing   Problem: Health Behavior/Discharge Planning: Goal: Ability to manage health-related needs will improve Outcome: Progressing   Problem: Pain Managment: Goal: General experience of comfort will improve Outcome: Progressing   Problem: Education: Goal: Understanding of CV disease, CV risk reduction, and recovery process will improve Outcome: Progressing   Problem: Cardiovascular: Goal: Ability to achieve and maintain adequate cardiovascular perfusion will improve Outcome: Progressing

## 2020-12-07 NOTE — H&P (View-Only) (Signed)
VASCULAR SURGERY ASSESSMENT & PLAN:   GANGRENE RIGHT FOOT WITH CRITICAL LIMB ISCHEMIA: Without revascularization the patient would require a right above-the-knee amputation.  I think his best chance for limb salvage is attempted revascularization and ray amputation of the right fifth and possibly fourth toes.  My plan is a right femoral endarterectomy with angioplasty and stenting of the right common and external iliac artery.  Alternatively he would require a left to right femoral-femoral bypass.  In addition, given the extent of the wound on the foot he will require infrainguinal bypass.  I plan on a right femoral to below-knee popliteal artery bypass.  His vein map shows he does not have adequate vein for a vein bypass and this will likely be done with a prosthetic graft.  Given the extent of his disease and the extent of the wound he is at risk for limb loss and we have discussed this multiple times.  I have discussed the indications for the procedure and the potential complications and he is agreeable to proceed.  All of his questions were answered  ID: He is on intravenous vancomycin.  Would consider adding gram-negative coverage.  ANTICOAGULATION: He is on intravenous heparin per pharmacy.  I have written to stop this at 8 AM in anticipation of surgery tomorrow.  SUBJECTIVE:   No specific complaints this morning.  PHYSICAL EXAM:   Vitals:   12/06/20 1954 12/06/20 2358 12/07/20 0403 12/07/20 0715  BP: 119/76 92/70 103/74 103/76  Pulse: 87 82 87 85  Resp: 17 18 20 19   Temp: 98.6 F (37 C) 99 F (37.2 C) 98.1 F (36.7 C) 98.4 F (36.9 C)  TempSrc: Oral Axillary Oral Oral  SpO2: 99% 97% 93% 97%  Weight:      Height:       No change in the dry gangrene of his right lateral foot and right fifth toe.  He has persistent cellulitis on the foot.  LABS:   VEIN MAP: Vein map shows that both great saphenous veins and small saphenous veins are small and likely not adequate for a bypass  conduit.  Lab Results  Component Value Date   WBC 15.6 (H) 12/07/2020   HGB 10.9 (L) 12/07/2020   HCT 35.1 (L) 12/07/2020   MCV 84.6 12/07/2020   PLT 416 (H) 12/07/2020   Lab Results  Component Value Date   CREATININE 0.83 12/07/2020   Lab Results  Component Value Date   INR 1.04 01/22/2009   CBG (last 3)  Recent Labs    12/06/20 0738 12/06/20 1712  GLUCAP 80 98    PROBLEM LIST:    Principal Problem:   Occlusion of right iliac artery (HCC) Active Problems:   Gastroesophageal reflux disease   Chronic pain syndrome   Type 2 diabetes mellitus without complication (HCC)   COPD (chronic obstructive pulmonary disease) (HCC)   Anxiety and depression   Hyperlipidemia   Tobacco abuse   Peripheral vascular disease of lower extremity with ulceration (HCC)   Dry gangrene (HCC)   Chronic respiratory failure (HCC)   CURRENT MEDS:    allopurinol  100 mg Oral q AM   buPROPion  150 mg Oral Q0600   gabapentin  300 mg Oral TID   insulin aspart  0-9 Units Subcutaneous TID WC   magnesium oxide  400 mg Oral Daily   metoprolol tartrate  12.5 mg Oral BID   nicotine  21 mg Transdermal Daily   pantoprazole  80 mg Oral Daily  rosuvastatin  10 mg Oral QHS   sodium chloride flush  3 mL Intravenous Q12H   tamsulosin  0.4 mg Oral q AM    [name] Office: [phone] 12/07/2020

## 2020-12-07 NOTE — Plan of Care (Signed)
  Problem: Education: Goal: Knowledge of General Education information will improve Description: Including pain rating scale, medication(s)/side effects and non-pharmacologic comfort measures Outcome: Progressing   Problem: Health Behavior/Discharge Planning: Goal: Ability to manage health-related needs will improve Outcome: Progressing   Problem: Coping: Goal: Level of anxiety will decrease Outcome: Progressing   Problem: Elimination: Goal: Will not experience complications related to bowel motility Outcome: Progressing Goal: Will not experience complications related to urinary retention Outcome: Progressing   Problem: Pain Managment: Goal: General experience of comfort will improve Outcome: Progressing   Problem: Education: Goal: Understanding of CV disease, CV risk reduction, and recovery process will improve Outcome: Progressing Goal: Individualized Educational Video(s) Outcome: Progressing   Problem: Activity: Goal: Ability to return to baseline activity level will improve Outcome: Progressing   Problem: Cardiovascular: Goal: Ability to achieve and maintain adequate cardiovascular perfusion will improve Outcome: Progressing Goal: Vascular access site(s) Level 0-1 will be maintained Outcome: Progressing

## 2020-12-08 ENCOUNTER — Inpatient Hospital Stay (HOSPITAL_COMMUNITY): Payer: Medicare HMO

## 2020-12-08 ENCOUNTER — Encounter (HOSPITAL_COMMUNITY)
Admission: EM | Disposition: A | Discharge status: Home Health | Payer: Self-pay | Source: Home / Self Care | Attending: Student

## 2020-12-08 ENCOUNTER — Inpatient Hospital Stay (HOSPITAL_COMMUNITY): Payer: Medicare HMO | Admitting: Certified Registered Nurse Anesthetist

## 2020-12-08 ENCOUNTER — Encounter (HOSPITAL_COMMUNITY): Payer: Self-pay | Admitting: Family Medicine

## 2020-12-08 DIAGNOSIS — E1165 Type 2 diabetes mellitus with hyperglycemia: Secondary | ICD-10-CM | POA: Diagnosis not present

## 2020-12-08 DIAGNOSIS — F419 Anxiety disorder, unspecified: Secondary | ICD-10-CM | POA: Diagnosis not present

## 2020-12-08 DIAGNOSIS — G894 Chronic pain syndrome: Secondary | ICD-10-CM | POA: Diagnosis not present

## 2020-12-08 DIAGNOSIS — I70261 Atherosclerosis of native arteries of extremities with gangrene, right leg: Secondary | ICD-10-CM

## 2020-12-08 DIAGNOSIS — I745 Embolism and thrombosis of iliac artery: Secondary | ICD-10-CM | POA: Diagnosis not present

## 2020-12-08 DIAGNOSIS — I10 Essential (primary) hypertension: Secondary | ICD-10-CM | POA: Diagnosis not present

## 2020-12-08 DIAGNOSIS — I70231 Atherosclerosis of native arteries of right leg with ulceration of thigh: Secondary | ICD-10-CM | POA: Diagnosis not present

## 2020-12-08 HISTORY — PX: FEMORAL-POPLITEAL BYPASS GRAFT: SHX937

## 2020-12-08 HISTORY — PX: AMPUTATION: SHX166

## 2020-12-08 HISTORY — PX: ANGIOPLASTY ILLIAC ARTERY: SHX5720

## 2020-12-08 HISTORY — PX: ENDARTERECTOMY FEMORAL: SHX5804

## 2020-12-08 LAB — CBC
HCT: 31.1 % — ABNORMAL LOW (ref 39.0–52.0)
Hemoglobin: 9.9 g/dL — ABNORMAL LOW (ref 13.0–17.0)
MCH: 26.7 pg (ref 26.0–34.0)
MCHC: 31.8 g/dL (ref 30.0–36.0)
MCV: 83.8 fL (ref 80.0–100.0)
Platelets: 374 10*3/uL (ref 150–400)
RBC: 3.71 MIL/uL — ABNORMAL LOW (ref 4.22–5.81)
RDW: 16.9 % — ABNORMAL HIGH (ref 11.5–15.5)
WBC: 11.5 10*3/uL — ABNORMAL HIGH (ref 4.0–10.5)
nRBC: 0 % (ref 0.0–0.2)

## 2020-12-08 LAB — POCT I-STAT 7, (LYTES, BLD GAS, ICA,H+H)
Acid-Base Excess: 3 mmol/L — ABNORMAL HIGH (ref 0.0–2.0)
Bicarbonate: 28.2 mmol/L — ABNORMAL HIGH (ref 20.0–28.0)
Calcium, Ion: 1.15 mmol/L (ref 1.15–1.40)
HCT: 32 % — ABNORMAL LOW (ref 39.0–52.0)
Hemoglobin: 10.9 g/dL — ABNORMAL LOW (ref 13.0–17.0)
O2 Saturation: 96 %
Patient temperature: 36.5
Potassium: 3.6 mmol/L (ref 3.5–5.1)
Sodium: 141 mmol/L (ref 135–145)
TCO2: 29 mmol/L (ref 22–32)
pCO2 arterial: 42 mmHg (ref 32.0–48.0)
pH, Arterial: 7.433 (ref 7.350–7.450)
pO2, Arterial: 80 mmHg — ABNORMAL LOW (ref 83.0–108.0)

## 2020-12-08 LAB — POCT ACTIVATED CLOTTING TIME
Activated Clotting Time: 237 seconds
Activated Clotting Time: 254 seconds
Activated Clotting Time: 254 seconds

## 2020-12-08 LAB — RENAL FUNCTION PANEL
Albumin: <1.5 g/dL — ABNORMAL LOW (ref 3.5–5.0)
Anion gap: 7 (ref 5–15)
BUN: 7 mg/dL (ref 6–20)
CO2: 28 mmol/L (ref 22–32)
Calcium: 7.5 mg/dL — ABNORMAL LOW (ref 8.9–10.3)
Chloride: 100 mmol/L (ref 98–111)
Creatinine, Ser: 0.85 mg/dL (ref 0.61–1.24)
GFR, Estimated: >60 mL/min (ref >60)
Glucose, Bld: 117 mg/dL — ABNORMAL HIGH (ref 70–99)
Phosphorus: 4 mg/dL (ref 2.5–4.6)
Potassium: 3.4 mmol/L — ABNORMAL LOW (ref 3.5–5.1)
Sodium: 135 mmol/L (ref 135–145)

## 2020-12-08 LAB — GLUCOSE, CAPILLARY
Glucose-Capillary: 98 mg/dL (ref 70–99)
Glucose-Capillary: 132 mg/dL — ABNORMAL HIGH (ref 70–99)
Glucose-Capillary: 164 mg/dL — ABNORMAL HIGH (ref 70–99)
Glucose-Capillary: 411 mg/dL — ABNORMAL HIGH (ref 70–99)
Glucose-Capillary: 335 mg/dL — ABNORMAL HIGH (ref 70–99)

## 2020-12-08 LAB — MRSA NEXT GEN BY PCR, NASAL: MRSA by PCR Next Gen: DETECTED — AB

## 2020-12-08 LAB — HEPARIN LEVEL (UNFRACTIONATED): Heparin Unfractionated: 0.22 IU/mL — ABNORMAL LOW (ref 0.30–0.70)

## 2020-12-08 LAB — MAGNESIUM: Magnesium: 1.9 mg/dL (ref 1.7–2.4)

## 2020-12-08 SURGERY — BYPASS GRAFT FEMORAL-POPLITEAL ARTERY
Anesthesia: General | Laterality: Right

## 2020-12-08 MED ORDER — PHENOL 1.4 % MT LIQD: 1.0000 | OROMUCOSAL | Status: DC | PRN

## 2020-12-08 MED ORDER — MIDAZOLAM HCL 2 MG/2ML IJ SOLN
INTRAMUSCULAR | Status: AC
  Filled 2020-12-08: qty 2

## 2020-12-08 MED ORDER — ONDANSETRON HCL 4 MG/2ML IJ SOLN
INTRAMUSCULAR | Status: DC | PRN
  Administered 2020-12-08: 4 mg via INTRAVENOUS

## 2020-12-08 MED ORDER — MUPIROCIN 2 % EX OINT
1.0000 "application " | TOPICAL_OINTMENT | Freq: Two times a day (BID) | CUTANEOUS | Status: AC
  Administered 2020-12-08 – 2020-12-12 (×9): 1 via NASAL
  Filled 2020-12-08: qty 22

## 2020-12-08 MED ORDER — IOHEXOL 300 MG/ML  SOLN
INTRAMUSCULAR | Status: DC | PRN
  Administered 2020-12-08: 50 mL

## 2020-12-08 MED ORDER — HEPARIN 6000 UNIT IRRIGATION SOLUTION
Status: AC
  Filled 2020-12-08: qty 500

## 2020-12-08 MED ORDER — HEPARIN SODIUM (PORCINE) 1000 UNIT/ML IJ SOLN
INTRAMUSCULAR | Status: DC | PRN
  Administered 2020-12-08: 2000 [IU] via INTRAVENOUS
  Administered 2020-12-08: 6000 [IU] via INTRAVENOUS
  Administered 2020-12-08: 1000 [IU] via INTRAVENOUS
  Administered 2020-12-08: 2000 [IU] via INTRAVENOUS

## 2020-12-08 MED ORDER — ORAL CARE MOUTH RINSE: 15.0000 mL | Freq: Once | OROMUCOSAL | Status: AC

## 2020-12-08 MED ORDER — PHENYLEPHRINE HCL (PRESSORS) 10 MG/ML IV SOLN
INTRAVENOUS | Status: DC | PRN
  Administered 2020-12-08 (×2): 100 ug via INTRAVENOUS

## 2020-12-08 MED ORDER — PROPOFOL 10 MG/ML IV BOLUS
INTRAVENOUS | Status: AC
  Filled 2020-12-08: qty 20

## 2020-12-08 MED ORDER — DOCUSATE SODIUM 100 MG PO CAPS
100.0000 mg | ORAL_CAPSULE | Freq: Every day | ORAL | Status: DC
  Administered 2020-12-09 – 2020-12-13 (×5): 100 mg via ORAL
  Filled 2020-12-08 (×5): qty 1

## 2020-12-08 MED ORDER — METOPROLOL TARTRATE 5 MG/5ML IV SOLN: 2.0000 mg | INTRAVENOUS | Status: DC | PRN

## 2020-12-08 MED ORDER — ASPIRIN EC 81 MG PO TBEC
81.0000 mg | DELAYED_RELEASE_TABLET | Freq: Every day | ORAL | Status: DC
  Administered 2020-12-09 – 2020-12-13 (×5): 81 mg via ORAL
  Filled 2020-12-08 (×5): qty 1

## 2020-12-08 MED ORDER — PROPOFOL 10 MG/ML IV BOLUS
INTRAVENOUS | Status: DC | PRN
  Administered 2020-12-08: 100 mg via INTRAVENOUS

## 2020-12-08 MED ORDER — SUGAMMADEX SODIUM 200 MG/2ML IV SOLN
INTRAVENOUS | Status: DC | PRN
  Administered 2020-12-08: 200 mg via INTRAVENOUS

## 2020-12-08 MED ORDER — HEPARIN SODIUM (PORCINE) 5000 UNIT/ML IJ SOLN
5000.0000 [IU] | Freq: Three times a day (TID) | INTRAMUSCULAR | Status: DC
  Administered 2020-12-09 – 2020-12-13 (×14): 5000 [IU] via SUBCUTANEOUS
  Filled 2020-12-08 (×13): qty 1

## 2020-12-08 MED ORDER — ROCURONIUM BROMIDE 10 MG/ML (PF) SYRINGE
PREFILLED_SYRINGE | INTRAVENOUS | Status: DC | PRN
  Administered 2020-12-08: 20 mg via INTRAVENOUS
  Administered 2020-12-08: 60 mg via INTRAVENOUS
  Administered 2020-12-08: 20 mg via INTRAVENOUS

## 2020-12-08 MED ORDER — LIDOCAINE 2% (20 MG/ML) 5 ML SYRINGE
INTRAMUSCULAR | Status: DC | PRN
Start: 2020-12-08 — End: 2020-12-08
  Administered 2020-12-08: 30 mg via INTRAVENOUS

## 2020-12-08 MED ORDER — LABETALOL HCL 5 MG/ML IV SOLN
10.0000 mg | INTRAVENOUS | Status: DC | PRN
Start: 2020-12-08 — End: 2020-12-13

## 2020-12-08 MED ORDER — MIDAZOLAM HCL 2 MG/2ML IJ SOLN
INTRAMUSCULAR | Status: DC | PRN
  Administered 2020-12-08: 2 mg via INTRAVENOUS

## 2020-12-08 MED ORDER — BACITRACIN ZINC 500 UNIT/GM EX OINT
TOPICAL_OINTMENT | CUTANEOUS | Status: AC
  Filled 2020-12-08: qty 28.35

## 2020-12-08 MED ORDER — 0.9 % SODIUM CHLORIDE (POUR BTL) OPTIME
TOPICAL | Status: DC | PRN
  Administered 2020-12-08: 1000 mL

## 2020-12-08 MED ORDER — CHLORHEXIDINE GLUCONATE 0.12 % MT SOLN
OROMUCOSAL | Status: AC
  Administered 2020-12-08: 15 mL via OROMUCOSAL
  Filled 2020-12-08: qty 15

## 2020-12-08 MED ORDER — LACTATED RINGERS IV SOLN: INTRAVENOUS | Status: DC | PRN

## 2020-12-08 MED ORDER — GUAIFENESIN-DM 100-10 MG/5ML PO SYRP: 15.0000 mL | ORAL_SOLUTION | ORAL | Status: DC | PRN

## 2020-12-08 MED ORDER — FENTANYL CITRATE (PF) 250 MCG/5ML IJ SOLN
INTRAMUSCULAR | Status: DC | PRN
  Administered 2020-12-08: 50 ug via INTRAVENOUS
  Administered 2020-12-08: 100 ug via INTRAVENOUS

## 2020-12-08 MED ORDER — CHLORHEXIDINE GLUCONATE 0.12 % MT SOLN: 15.0000 mL | Freq: Once | OROMUCOSAL | Status: AC

## 2020-12-08 MED ORDER — INSULIN ASPART 100 UNIT/ML IJ SOLN
3.0000 [IU] | Freq: Once | INTRAMUSCULAR | Status: AC
Start: 2020-12-09 — End: 2020-12-08
  Administered 2020-12-08: 3 [IU] via SUBCUTANEOUS

## 2020-12-08 MED ORDER — CLOPIDOGREL BISULFATE 75 MG PO TABS
75.0000 mg | ORAL_TABLET | Freq: Every day | ORAL | Status: DC
  Administered 2020-12-09 – 2020-12-13 (×5): 75 mg via ORAL
  Filled 2020-12-08 (×5): qty 1

## 2020-12-08 MED ORDER — PHENYLEPHRINE HCL-NACL 20-0.9 MG/250ML-% IV SOLN
INTRAVENOUS | Status: DC | PRN
  Administered 2020-12-08: 50 ug/min via INTRAVENOUS

## 2020-12-08 MED ORDER — LIDOCAINE 2% (20 MG/ML) 5 ML SYRINGE
INTRAMUSCULAR | Status: AC
  Filled 2020-12-08: qty 5

## 2020-12-08 MED ORDER — FENTANYL CITRATE (PF) 250 MCG/5ML IJ SOLN
INTRAMUSCULAR | Status: AC
  Filled 2020-12-08: qty 5

## 2020-12-08 MED ORDER — CHLORHEXIDINE GLUCONATE CLOTH 2 % EX PADS
6.0000 | MEDICATED_PAD | Freq: Every day | CUTANEOUS | Status: AC
  Administered 2020-12-08 – 2020-12-12 (×5): 6 via TOPICAL

## 2020-12-08 MED ORDER — DEXAMETHASONE SODIUM PHOSPHATE 10 MG/ML IJ SOLN
INTRAMUSCULAR | Status: DC | PRN
  Administered 2020-12-08: 10 mg via INTRAVENOUS

## 2020-12-08 MED ORDER — LACTATED RINGERS IV SOLN: INTRAVENOUS | Status: DC

## 2020-12-08 MED ORDER — PAPAVERINE HCL 30 MG/ML IJ SOLN
INTRAMUSCULAR | Status: AC
  Filled 2020-12-08: qty 2

## 2020-12-08 MED ORDER — POTASSIUM CHLORIDE CRYS ER 20 MEQ PO TBCR
40.0000 meq | EXTENDED_RELEASE_TABLET | Freq: Once | ORAL | Status: AC
  Administered 2020-12-08: 40 meq via ORAL
  Filled 2020-12-08: qty 2

## 2020-12-08 MED ORDER — HEPARIN 6000 UNIT IRRIGATION SOLUTION
Status: DC | PRN
  Administered 2020-12-08: 1

## 2020-12-08 MED ORDER — VANCOMYCIN HCL 1000 MG IV SOLR
INTRAVENOUS | Status: DC | PRN
  Administered 2020-12-08: 1000 mg via INTRAVENOUS

## 2020-12-08 MED ORDER — SODIUM CHLORIDE 0.9 % IV SOLN: 500.0000 mL | Freq: Once | INTRAVENOUS | Status: DC | PRN

## 2020-12-08 MED ORDER — SODIUM CHLORIDE 0.9 % IV SOLN: INTRAVENOUS | Status: DC

## 2020-12-08 MED ORDER — OXYCODONE-ACETAMINOPHEN 5-325 MG PO TABS
1.0000 | ORAL_TABLET | ORAL | Status: DC | PRN
  Administered 2020-12-08 – 2020-12-13 (×18): 2 via ORAL
  Filled 2020-12-08 (×18): qty 2

## 2020-12-08 MED ORDER — POTASSIUM CHLORIDE 10 MEQ/100ML IV SOLN
10.0000 meq | INTRAVENOUS | Status: AC
  Administered 2020-12-08 (×2): 10 meq via INTRAVENOUS
  Filled 2020-12-08 (×2): qty 100

## 2020-12-08 MED ORDER — ROCURONIUM BROMIDE 10 MG/ML (PF) SYRINGE
PREFILLED_SYRINGE | INTRAVENOUS | Status: AC
  Filled 2020-12-08: qty 10

## 2020-12-08 SURGICAL SUPPLY — 105 items
ADH SKN CLS APL DERMABOND .7 (GAUZE/BANDAGES/DRESSINGS) ×4
BAG COUNTER SPONGE SURGICOUNT (BAG) ×3 IMPLANT
BAG ISL DRAPE 18X18 STRL (DRAPES) ×4
BAG ISOLATION DRAPE 18X18 (DRAPES) ×2 IMPLANT
BAG SNAP BAND KOVER 36X36 (MISCELLANEOUS) ×2 IMPLANT
BAG SPNG CNTER NS LX DISP (BAG) ×2
BALLN MUSTANG 8.0X40 75 (BALLOONS) ×3
BALLOON MUSTANG 8.0X40 75 (BALLOONS) ×1 IMPLANT
BANDAGE ESMARK 6X9 LF (GAUZE/BANDAGES/DRESSINGS) ×1 IMPLANT
BLADE AVERAGE 25X9 (BLADE) ×2 IMPLANT
BLADE LONG MED 31X9 (MISCELLANEOUS) ×3 IMPLANT
BNDG CMPR 9X6 STRL LF SNTH (GAUZE/BANDAGES/DRESSINGS) ×2
BNDG CONFORM 3 STRL LF (GAUZE/BANDAGES/DRESSINGS) ×3 IMPLANT
BNDG ELASTIC 4X5.8 VLCR STR LF (GAUZE/BANDAGES/DRESSINGS) ×3 IMPLANT
BNDG ESMARK 6X9 LF (GAUZE/BANDAGES/DRESSINGS) ×3
BNDG GAUZE ELAST 4 BULKY (GAUZE/BANDAGES/DRESSINGS) ×3 IMPLANT
CANISTER SUCT 3000ML PPV (MISCELLANEOUS) ×3 IMPLANT
CANISTER WOUND CARE 500ML ATS (WOUND CARE) ×2 IMPLANT
CANNULA VESSEL 3MM 2 BLNT TIP (CANNULA) ×6 IMPLANT
CATH BEACON 5 .035 65 KMP TIP (CATHETERS) ×2 IMPLANT
CATH EMB 4FR 80CM (CATHETERS) ×2 IMPLANT
CATH EMB 5FR 80CM (CATHETERS) ×2 IMPLANT
CATH FOLEY LATEX FREE 16FR (CATHETERS) ×3
CATH FOLEY LF 16FR (CATHETERS) ×1 IMPLANT
CATH OMNI FLUSH .035X70CM (CATHETERS) ×2 IMPLANT
CLIP VESOCCLUDE MED 24/CT (CLIP) ×3 IMPLANT
CLIP VESOCCLUDE SM WIDE 24/CT (CLIP) ×3 IMPLANT
COVER DOME SNAP 22 D (MISCELLANEOUS) ×2 IMPLANT
COVER PROBE W GEL 5X96 (DRAPES) IMPLANT
COVER SURGICAL LIGHT HANDLE (MISCELLANEOUS) ×3 IMPLANT
CUFF TOURN SGL QUICK 24 (TOURNIQUET CUFF) ×3
CUFF TOURN SGL QUICK 34 (TOURNIQUET CUFF)
CUFF TOURN SGL QUICK 42 (TOURNIQUET CUFF) IMPLANT
CUFF TRNQT CYL 24X4X16.5-23 (TOURNIQUET CUFF) IMPLANT
CUFF TRNQT CYL 24X4X40X1 (TOURNIQUET CUFF) ×1 IMPLANT
CUFF TRNQT CYL 34X4.125X (TOURNIQUET CUFF) IMPLANT
DERMABOND ADVANCED (GAUZE/BANDAGES/DRESSINGS) ×2
DERMABOND ADVANCED .7 DNX12 (GAUZE/BANDAGES/DRESSINGS) ×3 IMPLANT
DRAIN CHANNEL 15F RND FF W/TCR (WOUND CARE) IMPLANT
DRAPE HALF SHEET 40X57 (DRAPES) ×3 IMPLANT
DRAPE ISOLATION BAG 18X18 (DRAPES) ×6
DRAPE X-RAY CASS 24X20 (DRAPES) IMPLANT
DRSG VAC ATS SM SENSATRAC (GAUZE/BANDAGES/DRESSINGS) ×2 IMPLANT
ELECT REM PT RETURN 9FT ADLT (ELECTROSURGICAL) ×3
ELECTRODE REM PT RTRN 9FT ADLT (ELECTROSURGICAL) ×2 IMPLANT
EVACUATOR SILICONE 100CC (DRAIN) IMPLANT
GAUZE 4X4 16PLY ~~LOC~~+RFID DBL (SPONGE) ×2 IMPLANT
GAUZE SPONGE 4X4 12PLY STRL (GAUZE/BANDAGES/DRESSINGS) ×3 IMPLANT
GLOVE SRG 8 PF TXTR STRL LF DI (GLOVE) ×3 IMPLANT
GLOVE SURG ENC MOIS LTX SZ7.5 (GLOVE) ×3 IMPLANT
GLOVE SURG SYN 7.5  E (GLOVE) ×3
GLOVE SURG SYN 7.5 E (GLOVE) ×2 IMPLANT
GLOVE SURG SYN 7.5 PF PI (GLOVE) IMPLANT
GLOVE SURG UNDER POLY LF SZ6.5 (GLOVE) ×2 IMPLANT
GLOVE SURG UNDER POLY LF SZ8 (GLOVE) ×6
GOWN STRL REUS W/ TWL LRG LVL3 (GOWN DISPOSABLE) ×6 IMPLANT
GOWN STRL REUS W/ TWL XL LVL3 (GOWN DISPOSABLE) ×1 IMPLANT
GOWN STRL REUS W/TWL LRG LVL3 (GOWN DISPOSABLE) ×9
GOWN STRL REUS W/TWL XL LVL3 (GOWN DISPOSABLE) ×3
GRAFT PROPATEN W/RING 6X80X60 (Vascular Products) ×2 IMPLANT
INSERT FOGARTY SM (MISCELLANEOUS) ×2 IMPLANT
KIT BASIN OR (CUSTOM PROCEDURE TRAY) ×3 IMPLANT
KIT ENCORE 26 ADVANTAGE (KITS) ×2 IMPLANT
KIT TURNOVER KIT B (KITS) ×3 IMPLANT
LOOP VESSEL MINI RED (MISCELLANEOUS) ×2 IMPLANT
MARKER GRAFT CORONARY BYPASS (MISCELLANEOUS) IMPLANT
NS IRRIG 1000ML POUR BTL (IV SOLUTION) ×6 IMPLANT
PACK GENERAL/GYN (CUSTOM PROCEDURE TRAY) ×3 IMPLANT
PACK PERIPHERAL VASCULAR (CUSTOM PROCEDURE TRAY) ×3 IMPLANT
PAD ARMBOARD 7.5X6 YLW CONV (MISCELLANEOUS) ×6 IMPLANT
PADDING CAST COTTON 6X4 STRL (CAST SUPPLIES) ×2 IMPLANT
SET COLLECT BLD 21X3/4 12 (NEEDLE) IMPLANT
SHEATH BRITE TIP 8FR 23CM (SHEATH) ×2 IMPLANT
SPECIMEN JAR SMALL (MISCELLANEOUS) ×3 IMPLANT
SPONGE SURGIFOAM ABS GEL 100 (HEMOSTASIS) IMPLANT
SPONGE T-LAP 18X18 ~~LOC~~+RFID (SPONGE) ×4 IMPLANT
STENT VIABAHN VBX 8X79X80 (Permanent Stent) ×2 IMPLANT
STOPCOCK 4 WAY LG BORE MALE ST (IV SETS) IMPLANT
STOPCOCK MORSE 400PSI 3WAY (MISCELLANEOUS) ×2 IMPLANT
SUT ETHIBOND 5 LR DA (SUTURE) IMPLANT
SUT ETHILON 3 0 PS 1 (SUTURE) ×3 IMPLANT
SUT MNCRL AB 4-0 PS2 18 (SUTURE) ×8 IMPLANT
SUT PROLENE 5 0 C 1 24 (SUTURE) ×8 IMPLANT
SUT PROLENE 6 0 BV (SUTURE) ×7 IMPLANT
SUT SILK 2 0 (SUTURE) ×3
SUT SILK 2 0 PERMA HAND 18 BK (SUTURE) IMPLANT
SUT SILK 2 0 SH (SUTURE) ×3 IMPLANT
SUT SILK 2-0 18XBRD TIE 12 (SUTURE) ×1 IMPLANT
SUT SILK 3 0 (SUTURE) ×6
SUT SILK 3-0 18XBRD TIE 12 (SUTURE) ×2 IMPLANT
SUT VIC AB 2-0 CTB1 (SUTURE) ×6 IMPLANT
SUT VIC AB 3-0 SH 27 (SUTURE) ×9
SUT VIC AB 3-0 SH 27X BRD (SUTURE) ×5 IMPLANT
SYR 10ML LL (SYRINGE) ×6 IMPLANT
SYR 30ML LL (SYRINGE) IMPLANT
SYR 3ML LL SCALE MARK (SYRINGE) ×4 IMPLANT
TOWEL GREEN STERILE (TOWEL DISPOSABLE) ×6 IMPLANT
TOWEL GREEN STERILE FF (TOWEL DISPOSABLE) ×3 IMPLANT
TRAY FOLEY MTR SLVR 16FR STAT (SET/KITS/TRAYS/PACK) ×3 IMPLANT
TUBING EXTENTION W/L.L. (IV SETS) IMPLANT
UNDERPAD 30X36 HEAVY ABSORB (UNDERPADS AND DIAPERS) ×3 IMPLANT
WATER STERILE IRR 1000ML POUR (IV SOLUTION) ×3 IMPLANT
WIRE AMPLATZ SS-J .035X180CM (WIRE) ×2 IMPLANT
WIRE AMPLATZ SS-J .035X260CM (WIRE) ×2 IMPLANT
WIRE STARTER BENTSON 035X150 (WIRE) ×2 IMPLANT

## 2020-12-08 NOTE — Progress Notes (Addendum)
Pt came back to rm 19 from PACU with 2l 02 and wound VAC. Reinitiated tele, CHG wipe given. VSS. Call bell within reach.   Lavenia Atlas, RN

## 2020-12-08 NOTE — Transfer of Care (Signed)
Immediate Anesthesia Transfer of Care Note  Patient: Raymond [name]  Procedure(s) Performed: RIGHT FEMORAL-BELOW KNEE POPLITEAL ARTERY BYPASS GRAFT (Right) RAY AMPUTATION OF RIGHT FIFTH TOE (Right) ILIAC ANGIOPLASTY WITH INSERTION OF RIGHT COMMON ILIAC 8MM X 79MM VIABAHN STENT (Right) RIGHT FEMORAL ENDARTERECTOMY WITH ILIOFEMORAL THROMBECTOMY (Right)  Patient Location: PACU  Anesthesia Type:General  Level of Consciousness: drowsy  Airway & Oxygen Therapy: Patient Spontanous Breathing and Patient connected to nasal cannula oxygen  Post-op Assessment: Report given to RN and Post -op Vital signs reviewed and stable  Post vital signs: Reviewed and stable  Last Vitals:  Vitals Value Taken Time  BP 67/54 12/08/20 1409  Temp    Pulse 67 12/08/20 1409  Resp 13 12/08/20 1409  SpO2 94 % 12/08/20 1409  Vitals shown include unvalidated device data.  Last Pain:  Vitals:   12/08/20 0756  TempSrc: Oral  PainSc: 0-No pain      Patients Stated Pain Goal: 3 (35/00/93 8182)  Complications: No notable events documented.

## 2020-12-08 NOTE — Progress Notes (Signed)
PROGRESS NOTE  Raymond [name] VZC:[id number] DOB: 01/16/1961   PCP: [name], MD  Patient is from: Home.  Lives alone.  Uses rolling walker at baseline.  DOA: 12/05/2020 LOS: 2  Chief complaints:  Chief Complaint  Patient presents with   Fall   Wound Check    Wound check-toes on right foot     Brief Narrative / Interim history: 60 year old M with PMH of COPD, DM-2 with neuropathy, chronic pain on oxycodone, anxiety, depression, HLD, gout and tobacco use disorder (2 PPD) presenting with increased right foot pain and ulcer, and admitted with critical right limb ischemia due to common iliac artery thrombosis extending into external and internal with distal embolize and multivessel PAD in right lower extremity.  He also had leukocytosis and mild tachycardia.  He was started on IV heparin and IV vancomycin.   Patient underwent right CFA endarterectomy, angioplasty and stenting of right common iliac artery and external iliac artery, right femoropopliteal bypass, right fifth toe ray amputation with VAC placement by Dr. Scot Dock on 12/08/2020.  Subjective: Seen and examined this afternoon after he returned from vascular surgery procedure.  No complaints.  He denies pain, shortness of breath, GI or UTI symptoms. Objective: Vitals:   12/08/20 1510 12/08/20 1546 12/08/20 1548 12/08/20 1600  BP:  94/70 103/80 112/88  Pulse: 68 68 68   Resp: 11 13 18 17   Temp:  (!) 97.3 F (36.3 C) (!) 97.3 F (36.3 C)   TempSrc:  Oral Oral   SpO2: 95% 97% 96% 96%  Weight:      Height:        Intake/Output Summary (Last 24 hours) at 12/08/2020 1743 Last data filed at 12/08/2020 1506 Gross per 24 hour  Intake 2813.38 ml  Output 3955 ml  Net -1141.62 ml   Filed Weights   12/05/20 2150  Weight: 64 kg    Examination:  GENERAL: No apparent distress.  Nontoxic. HEENT: MMM.  Vision and hearing grossly intact.  NECK: Supple.  No apparent JVD.  RESP:  No IWOB.  Fair aeration bilaterally. CVS:  RRR.  Heart sounds normal.  ABD/GI/GU: BS+. Abd soft, NTND.  MSK/EXT:  Moves extremities. S/p right fifth ray amputation SKIN: no apparent skin lesion or wound NEURO: Awake and alert. Oriented appropriately.  No apparent focal neuro deficit. PSYCH: Calm. Normal affect.   Procedures:  12/08/2020-right CFA endarterectomy, angioplasty and stenting of right common iliac artery and external iliac artery, right femoropopliteal bypass, right fifth toe ray amputation with VAC placement by Dr. Scot Dock  Microbiology summarized: COVID-19 and influenza PCR nonreactive.  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 -Bowel regimen  Chronic COPD: Stable.  -Continue inhalers and as needed nebulizers -Encourage smoking cessation  Controlled DM-2: A1c 5.5%.  On metformin at home. Recent Labs  Lab 12/07/20 1608 12/07/20 2111 12/08/20 0601 12/08/20 0811 12/08/20 1557  GLUCAP 134* 131* 98 132* 164*  -Continue CBG monitoring and SSI -Continue statin.  Normocytic anemia: Slight drop in Hgb likely dilutional. Recent Labs    12/05/20 2321 12/07/20 0149 12/07/20 2359  HGB 11.4* 10.9* 9.9*  -Check anemia panel in the  morning -Monitor   Hypokalemia -Replenish and recheck  Chronic pain syndrome -Pain management as above  GERD -Continue Protonix  Leukocytosis: Suspect this to be demargination versus infection.  Improved. -Continue monitoring  Tobacco use disorder: About 2 packs a day. -Encourage cessation -Nicotine patch -Already on Wellbutrin  Anxiety and  depression: Stable. -Continue home meds   Body mass index is 21.44 kg/m.         DVT prophylaxis:  heparin injection 5,000 Units Start: 12/09/20 0600 SCD's Start: 12/08/20 1547  On heparin drip  Code Status: Full code Family Communication: Patient and/or RN. Available if any question.  Level of care: Progressive Status is: Inpatient    Dispo: The patient is from: Home              Anticipated d/c is to:  To be determined              Patient currently is not medically stable to d/c.   Difficult to place patient No       Consultants:  Vascular surgery   Sch Meds:  Scheduled Meds:  allopurinol  100 mg Oral q AM   [START ON 12/09/2020] aspirin EC  81 mg Oral Q0600   buPROPion  150 mg Oral Q0600   Chlorhexidine Gluconate Cloth  6 each Topical Q0600   [START ON 12/09/2020] clopidogrel  75 mg Oral Q0600   [START ON 12/09/2020] docusate sodium  100 mg Oral Daily   gabapentin  300 mg Oral TID   [START ON 12/09/2020] heparin  5,000 Units Subcutaneous Q8H   insulin aspart  0-9 Units Subcutaneous TID WC   magnesium oxide  400 mg Oral Daily   metoprolol tartrate  12.5 mg Oral BID   mupirocin ointment  1 application Nasal BID   nicotine  21 mg Transdermal Daily   pantoprazole  80 mg Oral Daily   rosuvastatin  10 mg Oral QHS   sodium chloride flush  3 mL Intravenous Q12H   tamsulosin  0.4 mg Oral q AM   Continuous Infusions:  sodium chloride     sodium chloride     sodium chloride Stopped (12/08/20 1626)   vancomycin 1,000 mg (12/08/20 0104)   PRN Meds:.sodium chloride, sodium chloride, acetaminophen **OR** acetaminophen, albuterol, calcium carbonate, clonazePAM, guaiFENesin-dextromethorphan, hydrALAZINE, HYDROmorphone (DILAUDID) injection, labetalol, metoprolol tartrate, ondansetron **OR** ondansetron (ZOFRAN) IV, oxyCODONE-acetaminophen, phenol, senna-docusate, sodium chloride flush  Antimicrobials: Anti-infectives (From admission, onward)    Start     Dose/Rate  Route Frequency Ordered Stop   12/06/20 1300  vancomycin (VANCOCIN) IVPB 1000 mg/200 mL premix        1,000 mg 200 mL/hr over 60 Minutes Intravenous Every 12 hours 12/06/20 0137     12/06/20 0100  vancomycin (VANCOREADY) IVPB 1250 mg/250 mL        1,250 mg 166.7 mL/hr over 90 Minutes Intravenous  Once 12/06/20 0048 12/06/20 0358        I have personally reviewed the following labs and images: CBC: Recent Labs  Lab 12/05/20 2321 12/07/20 0149 12/07/20 2359  WBC 17.7* 15.6* 11.5*  NEUTROABS 12.8*  --   --   HGB 11.4* 10.9* 9.9*  HCT 35.0* 35.1* 31.1*  MCV 84.1 84.6 83.8  PLT 428* 416* 374   BMP &GFR Recent Labs  Lab 12/05/20 2321 12/07/20 0149 12/07/20 2359  NA 132* 135 135  K 3.9 4.2 3.4*  CL 98 101 100  CO2 29 27 28   GLUCOSE 104* 72 117*  BUN 7  5* 7  CREATININE 0.43* 0.83 0.85  CALCIUM 7.7* 7.9* 7.5*  MG  --   --  1.9  PHOS  --   --  4.0   Estimated Creatinine Clearance: 84.7 mL/min (by C-G formula based on SCr of 0.85 mg/dL). Liver & Pancreas: Recent Labs  Lab 12/05/20 2321 12/07/20 0149 12/07/20 2359  AST 12* 13*  --   ALT 15 13  --   ALKPHOS 146* 136*  --   BILITOT 0.2* 0.1*  --   PROT 5.4* 4.8*  --   ALBUMIN 1.9* 1.6* <1.5*   No results for input(s): LIPASE, AMYLASE in the last 168 hours. No results for input(s): AMMONIA in the last 168 hours. Diabetic: Recent Labs    12/06/20 1958  HGBA1C 5.5   Recent Labs  Lab 12/07/20 1608 12/07/20 2111 12/08/20 0601 12/08/20 0811 12/08/20 1557  GLUCAP 134* 131* 98 132* 164*   Cardiac Enzymes: No results for input(s): CKTOTAL, CKMB, CKMBINDEX, TROPONINI in the last 168 hours. No results for input(s): PROBNP in the last 8760 hours. Coagulation Profile: No results for input(s): INR, PROTIME in the last 168 hours. Thyroid Function Tests: No results for input(s): TSH, T4TOTAL, FREET4, T3FREE, THYROIDAB in the last 72 hours. Lipid Profile: No results for input(s): CHOL, HDL, LDLCALC, TRIG, CHOLHDL,  LDLDIRECT in the last 72 hours. Anemia Panel: No results for input(s): VITAMINB12, FOLATE, FERRITIN, TIBC, IRON, RETICCTPCT in the last 72 hours. Urine analysis:    Component Value Date/Time   COLORURINE YELLOW 11/11/2009 1455   APPEARANCEUR CLEAR 11/11/2009 1455   LABSPEC <1.005 (L) 11/11/2009 1455   PHURINE 6.5 11/11/2009 1455   GLUCOSEU NEGATIVE 11/11/2009 1455   HGBUR NEGATIVE 11/11/2009 1455   BILIRUBINUR NEGATIVE 11/11/2009 1455   KETONESUR NEGATIVE 11/11/2009 1455   PROTEINUR NEGATIVE 11/11/2009 1455   UROBILINOGEN 0.2 11/11/2009 1455   NITRITE NEGATIVE 11/11/2009 1455   LEUKOCYTESUR  11/11/2009 1455    NEGATIVE MICROSCOPIC NOT DONE ON URINES WITH NEGATIVE PROTEIN, BLOOD, LEUKOCYTES, NITRITE, OR GLUCOSE <1000 mg/dL.   Sepsis Labs: Invalid input(s): PROCALCITONIN, [place]  Microbiology: Recent Results (from the past 240 hour(s))  Resp Panel by RT-PCR (Flu A&B, Covid) Nasopharyngeal Swab     Status: None   Collection Time: 12/06/20  4:32 AM   Specimen: Nasopharyngeal Swab; Nasopharyngeal(NP) swabs in vial transport medium  Result Value Ref Range Status   SARS Coronavirus 2 by RT PCR NEGATIVE NEGATIVE Final    Comment: (NOTE) SARS-CoV-2 target nucleic acids are NOT DETECTED.  The SARS-CoV-2 RNA is generally detectable in upper respiratory specimens during the acute phase of infection. The lowest concentration of SARS-CoV-2 viral copies this assay can detect is 138 copies/mL. A negative result does not preclude SARS-Cov-2 infection and should not be used as the sole basis for treatment or other patient management decisions. A negative result may occur with  improper specimen collection/handling, submission of specimen other than nasopharyngeal swab, presence of viral mutation(s) within the areas targeted by this assay, and inadequate number of viral copies(<138 copies/mL). A negative result must be combined with clinical observations, patient history, and  epidemiological information. The expected result is Negative.  Fact Sheet for Patients:  EntrepreneurPulse.com.au  Fact Sheet for Healthcare Providers:  IncredibleEmployment.be  This test is no t yet approved or cleared by the [place] FDA and  has been authorized for detection and/or diagnosis of SARS-CoV-2 by FDA under an Emergency Use Authorization (EUA). This EUA will remain  in effect (meaning this test can be  used) for the duration of the COVID-19 declaration under Section 564(b)(1) of the Act, 21 U.S.C.section 360bbb-3(b)(1), unless the authorization is terminated  or revoked sooner.       Influenza A by PCR NEGATIVE NEGATIVE Final   Influenza B by PCR NEGATIVE NEGATIVE Final    Comment: (NOTE) The Xpert Xpress SARS-CoV-2/FLU/RSV plus assay is intended as an aid in the diagnosis of influenza from Nasopharyngeal swab specimens and should not be used as a sole basis for treatment. Nasal washings and aspirates are unacceptable for Xpert Xpress SARS-CoV-2/FLU/RSV testing.  Fact Sheet for Patients: EntrepreneurPulse.com.au  Fact Sheet for Healthcare Providers: IncredibleEmployment.be  This test is not yet approved or cleared by the [place] FDA and has been authorized for detection and/or diagnosis of SARS-CoV-2 by FDA under an Emergency Use Authorization (EUA). This EUA will remain in effect (meaning this test can be used) for the duration of the COVID-19 declaration under Section 564(b)(1) of the Act, 21 U.S.C. section 360bbb-3(b)(1), unless the authorization is terminated or revoked.  Performed at [facility], [address], [place], [place] [phone]   MRSA Next Gen by PCR, Nasal     Status: Abnormal   Collection Time: 12/08/20  5:24 AM   Specimen: Nasal Mucosa; Nasal Swab  Result Value Ref Range Status   MRSA by PCR Next Gen DETECTED (A) NOT DETECTED Final    Comment: RESULT CALLED  TO, READ BACK BY AND VERIFIED WITH: H WIESNER,RN@[id number]  12/08/20 [place] (NOTE) The GeneXpert MRSA Assay (FDA approved for NASAL specimens only), is one component of a comprehensive MRSA colonization surveillance program. It is not intended to diagnose MRSA infection nor to guide or monitor treatment for MRSA infections. Test performance is not FDA approved in patients less than 82 years old. Performed at [facility], [place] [address] [place], [place] [address]     Radiology Studies: HYBRID OR IMAGING ([place])  Result Date: 12/08/2020 There is no interpretation for this exam.  This order is for images obtained during a surgical procedure.  Please See "Surgeries" Tab for more information regarding the procedure.   HYBRID OR IMAGING ([facility] ONLY)  Result Date: 12/08/2020 There is no interpretation for this exam.  This order is for images obtained during a surgical procedure.  Please See "Surgeries" Tab for more information regarding the procedure.      Armani Gawlik T. [place]  If 7PM-7AM, please contact night-coverage [email or web address] 12/08/2020, 5:43 PM

## 2020-12-08 NOTE — Anesthesia Preprocedure Evaluation (Addendum)
Anesthesia Evaluation  Patient identified by MRN, date of birth, ID band Patient awake    Reviewed: Allergy & Precautions, NPO status , Patient's Chart, lab work & pertinent test results, reviewed documented beta blocker date and time   Airway Mallampati: I  TM Distance: >3 FB Neck ROM: Full    Dental  (+) Edentulous Lower, Edentulous Upper   Pulmonary COPD,  COPD inhaler, Current Smoker,    breath sounds clear to auscultation       Cardiovascular + Peripheral Vascular Disease   Rhythm:Regular Rate:Normal     Neuro/Psych  Headaches, PSYCHIATRIC DISORDERS Anxiety Depression Bipolar Disorder CVA    GI/Hepatic Neg liver ROS, GERD  ,  Endo/Other  diabetes, Type 2, Oral Hypoglycemic Agents  Renal/GU negative Renal ROS     Musculoskeletal  (+) Arthritis ,   Abdominal Normal abdominal exam  (+)   Peds  Hematology negative hematology ROS (+)   Anesthesia Other Findings   Reproductive/Obstetrics                            Anesthesia Physical Anesthesia Plan  ASA: 3  Anesthesia Plan: General   Post-op Pain Management:    Induction: Intravenous  PONV Risk Score and Plan: 2 and Ondansetron, Dexamethasone and Midazolam  Airway Management Planned: Oral ETT  Additional Equipment: Arterial line  Intra-op Plan:   Post-operative Plan: Extubation in OR  Informed Consent: I have reviewed the patients History and Physical, chart, labs and discussed the procedure including the risks, benefits and alternatives for the proposed anesthesia with the patient or authorized representative who has indicated his/her understanding and acceptance.     Dental advisory given  Plan Discussed with: CRNA  Anesthesia Plan Comments:        Anesthesia Quick Evaluation

## 2020-12-08 NOTE — Anesthesia Postprocedure Evaluation (Signed)
Anesthesia Post Note  Patient: QUINNTIN MALTER  Procedure(s) Performed: RIGHT FEMORAL-BELOW KNEE POPLITEAL ARTERY BYPASS GRAFT (Right) RAY AMPUTATION OF RIGHT FIFTH TOE (Right) ILIAC ANGIOPLASTY WITH INSERTION OF RIGHT COMMON ILIAC 8MM X 79MM VIABAHN STENT (Right) RIGHT FEMORAL ENDARTERECTOMY WITH ILIOFEMORAL THROMBECTOMY (Right)     Patient location during evaluation: PACU Anesthesia Type: General Level of consciousness: awake and alert Pain management: pain level controlled Vital Signs Assessment: post-procedure vital signs reviewed and stable Respiratory status: spontaneous breathing, nonlabored ventilation, respiratory function stable and patient connected to nasal cannula oxygen Cardiovascular status: blood pressure returned to baseline and stable Postop Assessment: no apparent nausea or vomiting Anesthetic complications: no   No notable events documented.  Last Vitals:  Vitals:   12/08/20 1600 12/08/20 1702  BP: 112/88 (!) 144/118  Pulse:    Resp: 17 12  Temp:    SpO2: 96% 97%    Last Pain:  Vitals:   12/08/20 1744  TempSrc:   PainSc: [place] Markhi Kleckner

## 2020-12-08 NOTE — Progress Notes (Signed)
ANTICOAGULATION CONSULT NOTE  Pharmacy Consult for Heparin Indication:  Right common/iliac artery thrombosis  Allergies  Allergen Reactions   Morphine And Related Shortness Of Breath and Palpitations   Penicillins Anaphylaxis   Vicodin [Hydrocodone-Acetaminophen] Shortness Of Breath and Palpitations   Latex Hives and Rash    Patient Measurements: Height: 5\' 8"  (172.7 cm) Weight: 64 kg (141 lb) IBW/kg (Calculated) : 68.4  Heparin Dosing Weight: 64 kg  Vital Signs: Temp: 98.8 F (37.1 C) (09/29 2322) Temp Source: Oral (09/29 2322) BP: 98/72 (09/29 2322) Pulse Rate: 71 (09/29 2322)  Labs: Recent Labs    12/05/20 2321 12/06/20 1248 12/07/20 0149 12/07/20 0712 12/07/20 1637 12/07/20 2359  HGB 11.4*  --  10.9*  --   --  9.9*  HCT 35.0*  --  35.1*  --   --  31.1*  PLT 428*  --  416*  --   --  374  HEPARINUNFRC  --    < >  --  0.21* 0.27* 0.22*  CREATININE 0.43*  --  0.83  --   --  0.85   < > = values in this interval not displayed.     Estimated Creatinine Clearance: 84.7 mL/min (by C-G formula based on SCr of 0.85 mg/dL).   Medications:  Scheduled:   allopurinol  100 mg Oral q AM   buPROPion  150 mg Oral Q0600   gabapentin  300 mg Oral TID   insulin aspart  0-9 Units Subcutaneous TID WC   magnesium oxide  400 mg Oral Daily   metoprolol tartrate  12.5 mg Oral BID   nicotine  21 mg Transdermal Daily   pantoprazole  80 mg Oral Daily   rosuvastatin  10 mg Oral QHS   sodium chloride flush  3 mL Intravenous Q12H   tamsulosin  0.4 mg Oral q AM   Infusions:   sodium chloride     heparin 1,700 Units/hr (12/08/20 0105)   vancomycin 1,000 mg (12/08/20 0104)    Assessment:  Raymond [name] with gangrenous foot, found to have right common iliac artery thrombosis per CT, starting heparin per pharmacy.  Per vascular surgery, heparin to be held 9/30 0800 for pending OR procedure. End time entered into heparin order.  Heparin level subtherapeutic (0.22) on gtt at 1700  units/hr. No issues with line or bleeding reported per RN.  Goal of Therapy:  Heparin level 0.3-0.7 units/ml Monitor platelets by anticoagulation protocol: Yes   Plan:  Increase heparin to 1850 units/hr  Heparin to be turned off at 0800 for surgery F/u plans post-OR Consider obtaining an anti-thrombin level as patient is on high doses of heparin if continued post procedure   Thank you for allowing pharmacy to be a part of this patient's care.  Sherlon Handing, PharmD, BCPS Please see amion for complete clinical pharmacist phone list 12/08/2020 1:36 AM

## 2020-12-08 NOTE — Progress Notes (Signed)
Mobility Specialist Progress Note    12/08/20 1606  Mobility  Level of Assistance Other (Comment) (cancelled d/t lethargy)  Mobility performed by  --    Hildred Alamin Mobility Specialist  Mobility Specialist Phone: [phone]

## 2020-12-08 NOTE — Anesthesia Procedure Notes (Signed)
Procedure Name: Intubation Date/Time: 12/08/2020 9:34 AM Performed by: Eligha Bridegroom, CRNA Pre-anesthesia Checklist: Patient identified, Emergency Drugs available, Suction available, Patient being monitored and Timeout performed Patient Re-evaluated:Patient Re-evaluated prior to induction Preoxygenation: Pre-oxygenation with 100% oxygen Induction Type: IV induction Ventilation: Mask ventilation without difficulty and Oral airway inserted - appropriate to patient size Laryngoscope Size: Mac and 4 Grade View: Grade I Tube type: Oral Tube size: 7.5 mm Number of attempts: 1 Airway Equipment and Method: Stylet Placement Confirmation: ETT inserted through vocal cords under direct vision, positive ETCO2 and breath sounds checked- equal and bilateral Secured at: 22 cm Tube secured with: Tape Dental Injury: Teeth and Oropharynx as per pre-operative assessment

## 2020-12-08 NOTE — Progress Notes (Addendum)
Called report to [place], Therapist, sports.  Pt had $116 in his backpack and refused for RN to bring down to security. Pt requested RN to put his backpack in the pt's cabinet for now.  Pt stated that his daughter would come later and got his backpack home.  RN counted money with the pt and pt agree with the amount of money:   1 dollar bill x31 5 dollar bill x9 20 dollar bill x2  Will waiting for his daughter and hand her his money and backpack.   Lavenia Atlas, RN

## 2020-12-08 NOTE — Anesthesia Procedure Notes (Signed)
Arterial Line Insertion Start/End9/30/2022 8:50 AM, 12/08/2020 9:02 AM Performed by: Dorthea Cove, CRNA, CRNA  Patient location: Pre-op. Preanesthetic checklist: patient identified, IV checked, site marked, risks and benefits discussed, surgical consent, monitors and equipment checked, pre-op evaluation, timeout performed and anesthesia consent Lidocaine 1% used for infiltration Right, radial was placed Catheter size: 20 G Hand hygiene performed  and maximum sterile barriers used   Attempts: 1 Procedure performed without using ultrasound guided technique. Following insertion, dressing applied. Post procedure assessment: normal and unchanged  Patient tolerated the procedure well with no immediate complications.

## 2020-12-08 NOTE — Discharge Instructions (Signed)
 [facility] [place]  Discharge instructions  Lower Extremity Bypass Surgery  Please refer to the following instruction for your post-procedure care. Your surgeon or physician assistant will discuss any changes with you.  Activity  You are encouraged to walk as much as you can. You can slowly return to normal activities during the month after your surgery. Avoid strenuous activity and heavy lifting until your doctor tells you it's OK. Avoid activities such as vacuuming or swinging a golf club. Do not drive until your doctor give the OK and you are no longer taking prescription pain medications. It is also normal to have difficulty with sleep habits, eating and bowel movement after surgery. These will go away with time.  Bathing/Showering  Shower daily after you go home. Do not soak in a bathtub, hot tub, or swim until the incision heals completely.  Incision Care  Clean your incision with mild soap and water. Shower every day. Pat the area dry with a clean towel. You do not need a bandage unless otherwise instructed. Do not apply any ointments or creams to your incision. If you have open wounds you will be instructed how to care for them or a visiting nurse may be arranged for you. If you have staples or sutures along your incision they will be removed at your post-op appointment. You may have skin glue on your incision. Do not peel it off. It will come off on its own in about one week.  Wash the groin wound with soap and water daily and pat dry. (No tub bath-only shower)  Then put a dry gauze or washcloth in the groin to keep this area dry to help prevent wound infection.  Do this daily and as needed.  Do not use Vaseline or neosporin on your incisions.  Only use soap and water on your incisions and then protect and keep dry.  Diet  Resume your normal diet. There are no special food restrictions following this procedure. A low fat/ low cholesterol diet is  recommended for all patients with vascular disease. In order to heal from your surgery, it is CRITICAL to get adequate nutrition. Your body requires vitamins, minerals, and protein. Vegetables are the best source of vitamins and minerals. Vegetables also provide the perfect balance of protein. Processed food has little nutritional value, so try to avoid this.  Medications  Resume taking all your medications unless your doctor or physician assistant tells you not to. If your incision is causing pain, you may take over-the-counter pain relievers such as acetaminophen (Tylenol). If you were prescribed a stronger pain medication, please aware these medication can cause nausea and constipation. Prevent nausea by taking the medication with a snack or meal. Avoid constipation by drinking plenty of fluids and eating foods with high amount of fiber, such as fruits, vegetables, and grains. Take Colace 100 mg (an over-the-counter stool softener) twice a day as needed for constipation.  Do not take Tylenol if you are taking prescription pain medications.  Follow Up  Our office will schedule a follow up appointment 2-3 weeks following discharge.  Please call us immediately for any of the following conditions  Severe or worsening pain in your legs or feet while at rest or while walking Increase pain, redness, warmth, or drainage (pus) from your incision site(s) Fever of 101 degree or higher The swelling in your leg with the bypass suddenly worsens and becomes more painful than when you were in the hospital If you have   been instructed to feel your graft pulse then you should do so every day. If you can no longer feel this pulse, call the office immediately. Not all patients are given this instruction.  Leg swelling is common after leg bypass surgery.  The swelling should improve over a few months following surgery. To improve the swelling, you may elevate your legs above the level of your heart while you are  sitting or resting. Your surgeon or physician assistant may ask you to apply an ACE wrap or wear compression (TED) stockings to help to reduce swelling.  Reduce your risk of vascular disease  Stop smoking. If you would like help call [facility] at 1-800-QUIT-NOW (1-800-784-8669) or [place] at 336-586-4000.  Manage your cholesterol Maintain a desired weight Control your diabetes weight Control your diabetes Keep your blood pressure down  If you have any questions, please call the office at [phone]  

## 2020-12-08 NOTE — Progress Notes (Signed)
PT Cancellation Note  Patient Details Name: Raymond [name] MRN: [id number] DOB: [birth year]   Cancelled Treatment:    Reason Eval/Treat Not Completed: Fatigue/lethargy limiting ability to participate. Will follow-up for PT Evaluation as schedule permits.  Mabeline Caras, PT, DPT Acute Rehabilitation Services  Pager [phone] Office [place] 12/08/2020, 4:26 PM

## 2020-12-08 NOTE — Progress Notes (Signed)
Patients night time CBG 411. Olevia Bowens, MD made aware.

## 2020-12-08 NOTE — OR Nursing (Signed)
Latex foley placed at 0940. Removed at 0949 to be replaced with non-latex foley. Reddened area at tip of penis noted prior to foley insertion with no change upon removal. No swelling.Stephenie Acres at end of procedure at 1351 with no changes noted.

## 2020-12-08 NOTE — Op Note (Signed)
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 Scot Dock, MD  ASSIST: Leontine Locket, PA  ANESTHESIA: General  EBL: Minimal  INDICATIONS:    Raymond [name] is a 60 y.o. male who presented with gangrene of the right lateral foot.  He had evidence of a right common iliac artery occlusion.  The occlusion extended into the hypogastric artery and external iliac artery on the right and he also appeared to have occlusion of the common femoral artery.  Based on his history I did not think this was acute.  He underwent preoperative vein mapping of both lower extremities and really did not have adequate vein on either side for an autogenous bypass.  FINDINGS:   His posterior tibial artery occludes above the ankle.  He has a brisk anterior tibial signal on the right.  The open toe amputation site bled well.  A VAC was placed.  The open wound measured 1 cm in depth, 3.5 cm in width, and 5 cm in length.  TECHNIQUE:   The patient was taken to the operating room and received a general anesthetic.  Arterial line had been placed by anesthesia.  The lower abdomen both groins and both lower extremities were prepped and draped in usual sterile fashion.  Of note his preoperative vein map showed that his best piece of vein was likely in the proximal left thigh.  I confirmed this with duplex prior to the procedure.  A longitudinal incision was made in the right groin.   The dissection was carried down to the common femoral artery.  The superficial femoral artery, deep femoral artery, and multiple branches were controlled with Vesseloops.  I dissected well under the inguinal ligament so that I could clamp the external iliac artery fairly high to allow for an adequate endarterectomy.  Next vein was harvested from the left thigh through a longitudinal incision.  Branches were divided tween clips and 3-0 silk ties.  The patient was heparinized.  ACT was monitored throughout the procedure.  Next the external iliac artery was clamped and all the distal branches were controlled.  A longitudinal arteriotomy was made in the common femoral artery.  This was extended down into the superficial femoral artery where there appeared to be some subacute clot proximally.  There was extensive calcific plaque throughout the entire common femoral artery.  This was endarterectomized.  Distally the plaque tapered in the proximal superficial femoral artery was patent.  I did place 1 tacking suture here.  I endarterectomized the deep femoral artery and got into good endpoint with good backbleeding.  There was another large lateral branch which also required endarterectomy that came off somewhat anterior and this was preserved also.  The artery was irrigated with copious amounts of heparin and all loose debris removed.    Next I did pass a 5 Fogarty and a 4 Fogarty catheter to try to see if there was any evidence that  the clot in the iliac was acute but was unable to traverse the occlusion.  Thus I think the clot was likely not acute.  I then remove the vein from the left thigh after was ligated proximally and distally and then opened longitudinally.  The valves were all sharply excised.  The vein was used as a vein patch and was sewn using continuous 5-0 Prolene suture.  Prior to completing the closure the arteries were backbled and flushed appropriately and the anastomosis completed.    Next  attention was turned to angioplasty and stenting of the right common iliac and external iliac artery occlusion.  I placed a 5-0 Prolene stitch in the patch and then cannulated between the stitch with a micropuncture needle and a micropuncture sheath was introduced over a wire.  I then advanced the Bentson wire up to the occlusion.  I advanced an 8 French sheath up into the external iliac artery.  The Bentson wire would not advance.  I then used a Kumpe catheter and was able using this for support to get through the occlusion in the proximal external iliac artery and common iliac artery.  Once I was up into the infrarenal aorta a pigtail catheter was placed and an aortogram was obtained which demonstrated the occlusion with about 1 cm of common iliac artery proximally that was patent.  Also shot a retrograde arteriogram through the femoral sheath to demonstrate that the distal external iliac artery looked patent with no significant disease.  I selected an 8 mm x 79 mm VBX stent.  I advanced the sheath through the occlusion over the Bentson wire and then exchanged for an Amplatz wire.  The dilator was removed.  I then advanced the stent to the marked position and retracted the sheath.  The stent was then inflated to 6 atm.  This was inflated to less than nominal pressure given that the distal stent was in the external iliac artery.  I then went back with an 8 mm x 4 cm balloon which was positioned in the proximal stent and this was inflated to 20 atm.  The pigtail catheter was then placed in the distal aorta and aortogram showed excellent result in the stent appeared to be in good position with good apposition.   Next the below the knee popliteal artery was exposed through a longitudinal incision after the gastrocnemius muscle was retracted posteriorly.  The artery was patent and had a lumen.  A tourniquet was placed on the thigh.  A tunnel was created from the below the knee incision to the groin incision and a  6 mm ringed PTFE graft was tunneled between the 2 incisions and pulled to the appropriate length.  Attention was first turned to the proximal anastomosis.  The vessels were all controlled again and then a longitudinal graftotomy made in the vein patch.  The graft was spatulated and sewn into side to the vein patch using continuous 6-0 Prolene suture.  Prior to completing this anastomosis the artery was backbled and flushed appropriately and the anastomosis completed.  The graft was imported the appropriate length for anastomosis to the below-knee popliteal artery.  The leg was exsanguinated with an Esmarch bandage.  The tourniquet was inflated to 300 mmHg.  Under tourniquet control, the prosthetic bypass graft was pulled to the appropriate length spatulated and sewn into side to the below-knee popliteal artery using continuous 6-0 Prolene suture.  At the completion the tourniquet was released.  The artery was backbled and then flushed  and then the anastomosis completed.  Flow was reestablished to the right leg.  At this point there was an excellent anterior tibial signal with the Doppler.  Next hemostasis was obtained in the wounds.  The groin incision was closed with a deep layer of 2-0 Vicryl.  A subcutaneous layer with 3-0 Vicryl, and the skin closed with 4-0 Monocryl.  The below the knee incision was closed with a deep layer of 3-0 Vicryl and the skin closed with a 4-0 Monocryl.  Dermabond was applied.   Next attention was turned to Ray amputation of the right fifth toe.  A fishmouth incision was made encompassing the toe and the dissection carried down to the metatarsal which was dissected free and then elevated the bone was then divided using a CD4 saw.  There was good bleeding in the tissue.  In order to close cover the bone I placed two 3-0 nylon sutures proximally and then a VAC sponge was placed over the open wound which measured 1 cm in depth by 3.5 centimeters in width by 5 cm in length.  Given the  complexity of the case a first assistant was necessary in order to expedient the procedure and safely perform the technical aspects of the operation.  Patient tolerated procedure well was transferred to the recovery room in stable condition.  All needle and sponge counts were correct.  [name], MD, FACS [facility]  DATE OF DICTATION:   12/08/2020

## 2020-12-08 NOTE — Progress Notes (Signed)
[facility] night shift PCU coverage note.  CBG was 411 mg/dL at 2122 and 335 mg/dL just recently.  He received 2 units SQ earlier at 1608.  NovoLog 3 units SQ x1 dose ordered.  Tennis Must, MD.

## 2020-12-08 NOTE — Interval H&P Note (Signed)
History and Physical Interval Note:  12/08/2020 8:49 AM  Raymond [name]  has presented today for surgery, with the diagnosis of Right Common Iliac Artery Occlusion, Gangrene of Right Foot.  The various methods of treatment have been discussed with the patient and family. After consideration of risks, benefits and other options for treatment, the patient has consented to  Procedure(s): RIGHT FEMORAL-BELOW KNEE POPLITEAL ARTERY BYPASS GRAFT (Right) RAY AMPUTATION OF RIGHT FIFTH TOE (Right) ILIOFEMORAL THROMBECTOMY (Right) ILIAC ANGIOPLASTY WITH POSSIBLE STENTING (Right) LEFT TO RIGHT FEMORAL-FEMORAL BYPASS (Bilateral) as a surgical intervention.  The patient's history has been reviewed, patient examined, no change in status, stable for surgery.  I have reviewed the patient's chart and labs.  Questions were answered to the patient's satisfaction.     [name]

## 2020-12-09 DIAGNOSIS — D509 Iron deficiency anemia, unspecified: Secondary | ICD-10-CM

## 2020-12-09 DIAGNOSIS — I745 Embolism and thrombosis of iliac artery: Secondary | ICD-10-CM | POA: Diagnosis not present

## 2020-12-09 DIAGNOSIS — E8809 Other disorders of plasma-protein metabolism, not elsewhere classified: Secondary | ICD-10-CM

## 2020-12-09 DIAGNOSIS — E440 Moderate protein-calorie malnutrition: Secondary | ICD-10-CM

## 2020-12-09 DIAGNOSIS — F419 Anxiety disorder, unspecified: Secondary | ICD-10-CM | POA: Diagnosis not present

## 2020-12-09 DIAGNOSIS — D519 Vitamin B12 deficiency anemia, unspecified: Secondary | ICD-10-CM

## 2020-12-09 DIAGNOSIS — I70231 Atherosclerosis of native arteries of right leg with ulceration of thigh: Secondary | ICD-10-CM | POA: Diagnosis not present

## 2020-12-09 DIAGNOSIS — G894 Chronic pain syndrome: Secondary | ICD-10-CM | POA: Diagnosis not present

## 2020-12-09 LAB — GLUCOSE, CAPILLARY
Glucose-Capillary: 217 mg/dL — ABNORMAL HIGH (ref 70–99)
Glucose-Capillary: 141 mg/dL — ABNORMAL HIGH (ref 70–99)
Glucose-Capillary: 107 mg/dL — ABNORMAL HIGH (ref 70–99)
Glucose-Capillary: 102 mg/dL — ABNORMAL HIGH (ref 70–99)

## 2020-12-09 LAB — RENAL FUNCTION PANEL
Albumin: <1.5 g/dL — ABNORMAL LOW (ref 3.5–5.0)
Anion gap: 6 (ref 5–15)
BUN: 7 mg/dL (ref 6–20)
CO2: 25 mmol/L (ref 22–32)
Calcium: 7.7 mg/dL — ABNORMAL LOW (ref 8.9–10.3)
Chloride: 103 mmol/L (ref 98–111)
Creatinine, Ser: 0.71 mg/dL (ref 0.61–1.24)
GFR, Estimated: >60 mL/min (ref >60)
Glucose, Bld: 165 mg/dL — ABNORMAL HIGH (ref 70–99)
Phosphorus: 3.8 mg/dL (ref 2.5–4.6)
Potassium: 4 mmol/L (ref 3.5–5.1)
Sodium: 134 mmol/L — ABNORMAL LOW (ref 135–145)

## 2020-12-09 LAB — LIPID PANEL
Cholesterol: 83 mg/dL (ref 0–200)
HDL: 35 mg/dL — ABNORMAL LOW (ref >40)
LDL Cholesterol: 31 mg/dL (ref 0–99)
Total CHOL/HDL Ratio: 2.4 RATIO
Triglycerides: 85 mg/dL (ref <150)
VLDL: 17 mg/dL (ref 0–40)

## 2020-12-09 LAB — IRON AND TIBC
Iron: 18 ug/dL — ABNORMAL LOW (ref 45–182)
Saturation Ratios: 8 % — ABNORMAL LOW (ref 17.9–39.5)
TIBC: 217 ug/dL — ABNORMAL LOW (ref 250–450)
UIBC: 199 ug/dL

## 2020-12-09 LAB — RETICULOCYTES
Immature Retic Fract: 23.1 % — ABNORMAL HIGH (ref 2.3–15.9)
RBC.: 3.65 MIL/uL — ABNORMAL LOW (ref 4.22–5.81)
Retic Count, Absolute: 63.1 10*3/uL (ref 19.0–186.0)
Retic Ct Pct: 1.7 % (ref 0.4–3.1)

## 2020-12-09 LAB — CBC
HCT: 30.1 % — ABNORMAL LOW (ref 39.0–52.0)
Hemoglobin: 9.6 g/dL — ABNORMAL LOW (ref 13.0–17.0)
MCH: 26.5 pg (ref 26.0–34.0)
MCHC: 31.9 g/dL (ref 30.0–36.0)
MCV: 83.1 fL (ref 80.0–100.0)
Platelets: 388 10*3/uL (ref 150–400)
RBC: 3.62 MIL/uL — ABNORMAL LOW (ref 4.22–5.81)
RDW: 17 % — ABNORMAL HIGH (ref 11.5–15.5)
WBC: 14.1 10*3/uL — ABNORMAL HIGH (ref 4.0–10.5)
nRBC: 0 % (ref 0.0–0.2)

## 2020-12-09 LAB — VITAMIN B12: Vitamin B-12: 141 pg/mL — ABNORMAL LOW (ref 180–914)

## 2020-12-09 LAB — FOLATE: Folate: 6.6 ng/mL (ref >5.9)

## 2020-12-09 LAB — MAGNESIUM: Magnesium: 1.8 mg/dL (ref 1.7–2.4)

## 2020-12-09 LAB — FERRITIN: Ferritin: 21 ng/mL — ABNORMAL LOW (ref 24–336)

## 2020-12-09 MED ORDER — ROSUVASTATIN CALCIUM 20 MG PO TABS
20.0000 mg | ORAL_TABLET | Freq: Every day | ORAL | Status: DC
  Administered 2020-12-09 – 2020-12-12 (×4): 20 mg via ORAL
  Filled 2020-12-09 (×5): qty 1

## 2020-12-09 MED ORDER — SULFAMETHOXAZOLE-TRIMETHOPRIM 800-160 MG PO TABS
1.0000 | ORAL_TABLET | Freq: Two times a day (BID) | ORAL | Status: DC
  Administered 2020-12-09 – 2020-12-13 (×8): 1 via ORAL
  Filled 2020-12-09 (×8): qty 1

## 2020-12-09 MED ORDER — CYANOCOBALAMIN 1000 MCG/ML IJ SOLN
1000.0000 ug | Freq: Once | INTRAMUSCULAR | Status: AC
  Administered 2020-12-09: 1000 ug via INTRAMUSCULAR
  Filled 2020-12-09: qty 1

## 2020-12-09 MED ORDER — INSULIN GLARGINE-YFGN 100 UNIT/ML ~~LOC~~ SOLN
10.0000 [IU] | Freq: Every day | SUBCUTANEOUS | Status: DC
  Administered 2020-12-09 – 2020-12-11 (×3): 10 [IU] via SUBCUTANEOUS
  Filled 2020-12-09 (×3): qty 0.1

## 2020-12-09 MED ORDER — VITAMIN B-12 1000 MCG PO TABS
1000.0000 ug | ORAL_TABLET | Freq: Every day | ORAL | Status: DC
  Administered 2020-12-09 – 2020-12-13 (×5): 1000 ug via ORAL
  Filled 2020-12-09 (×5): qty 1

## 2020-12-09 NOTE — Progress Notes (Addendum)
  Progress Note    12/09/2020 8:14 AM 1 Day Post-Op  Subjective:  no complaints today   Vitals:   12/08/20 2309 12/09/20 0331  BP:    Pulse:  79  Resp: 14 16  Temp: 98.2 F (36.8 C) 97.9 F (36.6 C)  SpO2: 96%    Physical Exam: Lungs:  non labored Incisions:  R groin and popliteal incision c/d/I; LLE incision c/d/I; R 5th toe amp with vac, good seal Extremities: palpable R DP Neurologic: A&O  CBC    Component Value Date/Time   WBC 14.1 (H) 12/09/2020 0325   RBC 3.65 (L) 12/09/2020 0325   RBC 3.62 (L) 12/09/2020 0325   HGB 9.6 (L) 12/09/2020 0325   HGB 15.6 09/25/2012 1554   HCT 30.1 (L) 12/09/2020 0325   HCT 44 09/25/2012 1554   PLT 388 12/09/2020 0325   MCV 83.1 12/09/2020 0325   MCV 86.1 09/25/2012 1554   MCH 26.5 12/09/2020 0325   MCHC 31.9 12/09/2020 0325   RDW 17.0 (H) 12/09/2020 0325   LYMPHSABS 3.7 12/05/2020 2321   MONOABS 1.0 12/05/2020 2321   EOSABS 0.0 12/05/2020 2321   BASOSABS 0.1 12/05/2020 2321    BMET    Component Value Date/Time   NA 134 (L) 12/09/2020 0325   NA 141 09/25/2012 1554   K 4.0 12/09/2020 0325   K 4.1 09/25/2012 1554   CL 103 12/09/2020 0325   CO2 25 12/09/2020 0325   GLUCOSE 165 (H) 12/09/2020 0325   BUN 7 12/09/2020 0325   BUN 12 09/25/2012 1554   CREATININE 0.71 12/09/2020 0325   CREATININE 1.09 09/25/2012 1554   CALCIUM 7.7 (L) 12/09/2020 0325   CALCIUM 8.9 09/25/2012 1554   GFRNONAA >60 12/09/2020 0325   GFRAA >60 06/24/2017 0515    INR    Component Value Date/Time   INR 1.04 01/22/2009 1727     Intake/Output Summary (Last 24 hours) at 12/09/2020 0814 Last data filed at 12/09/2020 0543 Gross per 24 hour  Intake 1950 ml  Output 2815 ml  Net -865 ml     Assessment/Plan:  60 y.o. male is s/p R iliac stent, CFA endarterectomy with L saph vein patch, and R femoral to pop with PTFE and 5th toe amp 1 Day Post-Op   R foot well perfused with palpable DP pulse Wound vac with good seal; will order darco shoe  for heel weightbearing only PT/OT Continue DAPT with asa and plavix TOC arranging [place] RN and home vac   Dagoberto Ligas, PA-C [facility] [phone] 12/09/2020 8:14 AM

## 2020-12-09 NOTE — Plan of Care (Signed)
  Problem: Education: Goal: Knowledge of General Education information will improve Description: Including pain rating scale, medication(s)/side effects and non-pharmacologic comfort measures Outcome: Progressing   Problem: Health Behavior/Discharge Planning: Goal: Ability to manage health-related needs will improve Outcome: Progressing   Problem: Coping: Goal: Level of anxiety will decrease Outcome: Progressing   Problem: Elimination: Goal: Will not experience complications related to bowel motility Outcome: Progressing Goal: Will not experience complications related to urinary retention Outcome: Progressing   Problem: Pain Managment: Goal: General experience of comfort will improve Outcome: Progressing   Problem: Education: Goal: Understanding of CV disease, CV risk reduction, and recovery process will improve Outcome: Progressing Goal: Individualized Educational Video(s) Outcome: Progressing   Problem: Activity: Goal: Ability to return to baseline activity level will improve Outcome: Progressing   Problem: Cardiovascular: Goal: Ability to achieve and maintain adequate cardiovascular perfusion will improve Outcome: Progressing Goal: Vascular access site(s) Level 0-1 will be maintained Outcome: Progressing   Problem: Cardiovascular: Goal: Ability to achieve and maintain adequate cardiovascular perfusion will improve Outcome: Progressing   Problem: Activity: Goal: Ability to return to baseline activity level will improve Outcome: Progressing

## 2020-12-09 NOTE — Progress Notes (Signed)
Orthopedic Tech Progress Note Patient Details:  Raymond [name] 23-Jul-1960 [id number]  Applied in-house DARCO shoe to pt's Rt foot. Made sure the wound vac tubing was not interfered with and kept the closure slightly more loose around that area.  Ortho Devices Type of Ortho Device: Darco shoe Ortho Device/Splint Location: RLE Ortho Device/Splint Interventions: Ordered, Application, Adjustment   Post Interventions Patient Tolerated: Well Instructions Provided: Care of device, Poper ambulation with device, Adjustment of device  Breland Trouten Jeri Modena 12/09/2020, 1:42 PM

## 2020-12-09 NOTE — Progress Notes (Signed)
OT Cancellation Note  Patient Details Name: Raymond [name] MRN: [id number] DOB: 17-Jun-1960   Cancelled Treatment:    Reason Eval/Treat Not Completed: Other (comment) (on arrival to room x2 attempts pt without darco shoe in room. nursing made aware, PT confirms contacting [employer]otech. OT will continue with attempts schedule permitting)  Ally L Quill Grinder 12/09/2020, 12:10 PM

## 2020-12-09 NOTE — Progress Notes (Deleted)
Referring Provider: [name], MD Primary Care Physician:  [name], MD Primary GI Physician: Dr. Gala Romney  No chief complaint on file.   HPI:   Raymond [name] is a 60 y.o. male presenting today for follow-up of GERD, constipation, and gastric wall thickening.  Patient was last seen in our office 08/02/2020 for drug-induced constipation, GERD, and gastric wall thickening.  He had previously been hospitalized at [facility] in May with abdominal pain, constipation, and vomiting.  CT revealed significant debris within the stomach, significant antral wall thickening, mildly prominent loops of small bowel in the left central abdomen without transition zone, significant stool throughout the colon.  He was treated supportively with improvement in symptoms.  Patient reported being on a liquid or soft diet since his hospitalization.  He was not taking anything for constipation and was typically having 1 soft, formed bowel movement daily.  He had increase his water intake and was drinking apple juice and eating applesauce.  Reports history of bowels moving every few days with hard stools, but had gone 2 weeks just prior to his hospitalization.  Denies history of BRBPR or melena.  Abdominal pain, nausea/vomiting resolved since hospitalization.  Day prior to office visit was the first day he tried "regular foods and tolerated this fine.  Chronically on Percocet for hip pain.  GERD is well controlled on omeprazole 40 mg daily.  Admits to significant NSAID use.  Overall, suspected drug-induced constipation.  Plan to hold off on starting medications as his bowels are moving well.  Advised to advance diet slowly, use Colace daily if needed, and call if constipation returns.  I did offer him an EGD due to gastric wall thickening on CT as well as family history significant for mother with stomach cancer, but patient preferred to hold off on this for now.  Advised to limit NSAIDs and call when he was ready to  schedule.  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  Noted to have hemoglobin of 11.4 on admission and component of iron deficiency, with ferritin 21, iron 18, saturation 8%.  Today:  GERD:  Constipation:  Gastric wall thickening:  IDA:  Past Medical History:  Diagnosis Date   Anxiety    Back pain    Bipolar disorder (HCC)    Borderline hypertension    Chronic pain    COPD (chronic obstructive pulmonary disease) (HCC)    Depression with anxiety    GERD (gastroesophageal reflux disease)    Headache    Hip pain    Hypercholesterolemia    Stroke ([place])    Tobacco use     Past Surgical History:  Procedure Laterality Date   ANTERIOR FUSION CERVICAL SPINE     BACK SURGERY     CHOLECYSTECTOMY     COLONOSCOPY N/A 10/28/2012   CZY:SAYTKZ polyp-hyperplastic. Internal hemorrhoids. Distal 5 cm of terminal ileum appeared normal.   CORONARY ANGIOPLASTY WITH STENT PLACEMENT  1980s   ESOPHAGOGASTRODUODENOSCOPY N/A 10/28/2012   SWF:UXNATFTD gastric mucosa with mottling and submucosal petechiae. Mild chronic gastritis but no H. pylori home path. Small hiatal hernia. Duodenal lipoma   EYE SURGERY  Left eye   as a child   LUMBAR SPINE SURGERY     3 x     No current facility-administered medications for this visit.  No current outpatient medications on file.   Facility-Administered Medications Ordered in Other Visits  Medication Dose Route Frequency Provider Last Rate Last Admin   0.9 %  sodium chloride infusion  250 mL Intravenous PRN Rhyne, Samantha J, PA-C       0.9 %  sodium chloride infusion  500 mL Intravenous Once PRN Rhyne, Samantha J, PA-C       acetaminophen (TYLENOL) tablet 650 mg  650 mg Oral Q6H PRN Rhyne, Samantha J, PA-C       Or   acetaminophen (TYLENOL) suppository 650  mg  650 mg Rectal Q6H PRN Rhyne, Samantha J, PA-C       albuterol (PROVENTIL) (2.5 MG/3ML) 0.083% nebulizer solution 2.5 mg  2.5 mg Nebulization Q4H PRN Rhyne, Samantha J, PA-C       allopurinol (ZYLOPRIM) tablet 100 mg  100 mg Oral q AM Rhyne, Samantha J, PA-C   100 mg at 12/09/20 0616   aspirin EC tablet 81 mg  81 mg Oral Q0600 Gabriel Earing, PA-C   81 mg at 12/09/20 0617   buPROPion (WELLBUTRIN XL) 24 hr tablet 150 mg  150 mg Oral Q0600 Rhyne, Hulen Shouts, PA-C   150 mg at 12/09/20 4034   calcium carbonate (TUMS - dosed in mg elemental calcium) chewable tablet 400 mg of elemental calcium  2 tablet Oral BID PRN Rhyne, Samantha J, PA-C       Chlorhexidine Gluconate Cloth 2 % PADS 6 each  6 each Topical Q0600 Rhyne, Hulen Shouts, PA-C   6 each at 12/09/20 0617   clonazePAM (KLONOPIN) tablet 0.5 mg  0.5 mg Oral BID PRN Gabriel Earing, PA-C   0.5 mg at 12/06/20 2013   clopidogrel (PLAVIX) tablet 75 mg  75 mg Oral Q0600 Gabriel Earing, PA-C   75 mg at 12/09/20 0616   docusate sodium (COLACE) capsule 100 mg  100 mg Oral Daily Rhyne, Samantha J, PA-C   100 mg at 12/09/20 1016   gabapentin (NEURONTIN) capsule 300 mg  300 mg Oral TID Rhyne, Samantha J, PA-C   300 mg at 12/09/20 1736   guaiFENesin-dextromethorphan (ROBITUSSIN DM) 100-10 MG/5ML syrup 15 mL  15 mL Oral Q4H PRN Rhyne, Samantha J, PA-C       heparin injection 5,000 Units  5,000 Units Subcutaneous Q8H Rhyne, Samantha J, PA-C   5,000 Units at 12/09/20 1311   hydrALAZINE (APRESOLINE) injection 5 mg  5 mg Intravenous Q6H PRN Rhyne, Samantha J, PA-C       HYDROmorphone (DILAUDID) injection 0.5 mg  0.5 mg Intravenous Q3H PRN Rhyne, Samantha J, PA-C   0.5 mg at 12/09/20 1737   insulin aspart (novoLOG) injection 0-9 Units  0-9 Units Subcutaneous TID WC Rhyne, Samantha J, PA-C   1 Units at 12/09/20 1311   insulin glargine-yfgn (SEMGLEE) injection 10 Units  10 Units Subcutaneous Daily Wendee Beavers T, MD   10 Units at 12/09/20 1019   labetalol  (NORMODYNE) injection 10 mg  10 mg Intravenous Q10 min PRN Rhyne, Samantha J, PA-C       magnesium oxide (MAG-OX) tablet 400 mg  400 mg Oral Daily Rhyne, Samantha J, PA-C   400 mg at 12/09/20 1016   metoprolol tartrate (LOPRESSOR) injection 2-5 mg  2-5 mg Intravenous Q2H PRN Rhyne, Samantha J, PA-C       metoprolol tartrate (LOPRESSOR) tablet 12.5 mg  12.5 mg Oral BID Rhyne, Samantha J, PA-C   12.5 mg at 12/08/20 2124   mupirocin ointment (  BACTROBAN) 2 % 1 application  1 application Nasal BID Gabriel Earing, PA-C   1 application at 67/61/95 1018   nicotine (NICODERM CQ - dosed in mg/24 hours) patch 21 mg  21 mg Transdermal Daily Rhyne, Samantha J, PA-C   21 mg at 12/09/20 1019   ondansetron (ZOFRAN) tablet 4 mg  4 mg Oral Q6H PRN Rhyne, Samantha J, PA-C       Or   ondansetron (ZOFRAN) injection 4 mg  4 mg Intravenous Q6H PRN Rhyne, Samantha J, PA-C       oxyCODONE-acetaminophen (PERCOCET/ROXICET) 5-325 MG per tablet 1-2 tablet  1-2 tablet Oral Q4H PRN Rhyne, Samantha J, PA-C   2 tablet at 12/09/20 1256   pantoprazole (PROTONIX) EC tablet 80 mg  80 mg Oral Daily Rhyne, Samantha J, PA-C   80 mg at 12/09/20 1018   phenol (CHLORASEPTIC) mouth spray 1 spray  1 spray Mouth/Throat PRN Rhyne, Samantha J, PA-C       rosuvastatin (CRESTOR) tablet 20 mg  20 mg Oral QHS Beldon, Brianna S, RPH       senna-docusate (Senokot-S) tablet 1 tablet  1 tablet Oral QHS PRN Rhyne, Samantha J, PA-C       sodium chloride flush (NS) 0.9 % injection 3 mL  3 mL Intravenous Q12H Rhyne, Samantha J, PA-C   3 mL at 12/09/20 1019   sodium chloride flush (NS) 0.9 % injection 3 mL  3 mL Intravenous PRN Rhyne, Samantha J, PA-C       sulfamethoxazole-trimethoprim (BACTRIM DS) 800-160 MG per tablet 1 tablet  1 tablet Oral Q12H Gonfa, Taye T, MD       tamsulosin (FLOMAX) capsule 0.4 mg  0.4 mg Oral q AM Rhyne, Samantha J, PA-C   0.4 mg at 12/09/20 0932   vitamin B-12 (CYANOCOBALAMIN) tablet 1,000 mcg  1,000 mcg Oral Daily Wendee Beavers  T, MD   1,000 mcg at 12/09/20 1736    Allergies as of 12/11/2020 - Review Complete 12/08/2020  Allergen Reaction Noted   Morphine and related Shortness Of Breath and Palpitations 10/15/2012   Penicillins Anaphylaxis 07/09/2010   Vicodin [hydrocodone-acetaminophen] Shortness Of Breath and Palpitations 10/15/2012   Latex Hives and Rash 10/19/2012    Family History  Problem Relation Age of Onset   Stomach cancer Mother    Throat cancer Father    Colon cancer Neg Hx     Social History   Socioeconomic History   Marital status: Married    Spouse name: Not on file   Number of children: 4   Years of education: 11   Highest education level: Not on file  Occupational History   Occupation: Disabled  Tobacco Use   Smoking status: Every Day    Packs/day: 2.00    Years: 30.00    Pack years: 60.00    Types: Cigarettes   Smokeless tobacco: Never  Substance and Sexual Activity   Alcohol use: No    Alcohol/week: 0.0 standard drinks    Comment: history of ETOH abuse in past, none in 4 years   Drug use: No   Sexual activity: Not Currently  Other Topics Concern   Not on file  Social History Narrative   Lives at home alone.   12 pack of [place] Dew per day.   Right-handed.   Social Determinants of Health   Financial Resource Strain: Not on file  Food Insecurity: Not on file  Transportation Needs: Not on file  Physical Activity: Not on file  Stress: Not on file  Social Connections: Not on file    Review of Systems: Gen: Denies fever, chills, anorexia. Denies fatigue, weakness, weight loss.  CV: Denies chest pain, palpitations, syncope, peripheral edema, and claudication. Resp: Denies dyspnea at rest, cough, wheezing, coughing up blood, and pleurisy. GI: Denies vomiting blood, jaundice, and fecal incontinence.   Denies dysphagia or odynophagia. Derm: Denies rash, itching, dry skin Psych: Denies depression, anxiety, memory loss, confusion. No homicidal or suicidal ideation.   Heme: Denies bruising, bleeding, and enlarged lymph nodes.  Physical Exam: There were no vitals taken for this visit. General:   Alert and oriented. No distress noted. Pleasant and cooperative.  Head:  Normocephalic and atraumatic. Eyes:  Conjuctiva clear without scleral icterus. Mouth:  Oral mucosa pink and moist. Good dentition. No lesions. Heart:  S1, S2 present without murmurs appreciated. Lungs:  Clear to auscultation bilaterally. No wheezes, rales, or rhonchi. No distress.  Abdomen:  +BS, soft, non-tender and non-distended. No rebound or guarding. No HSM or masses noted. Msk:  Symmetrical without gross deformities. Normal posture. Extremities:  Without edema. Neurologic:  Alert and  oriented x4 Psych:  Alert and cooperative. Normal mood and affect.

## 2020-12-09 NOTE — Evaluation (Signed)
Physical Therapy Evaluation Patient Details Name: Raymond [name] MRN: [id number] DOB: [birth year] Today's Date: 12/09/2020  History of Present Illness  The pt is a 60 yo male presenting 9/27 after a fall at home due to ongoing pain in his R foot. The pt was found to have R common/external iliac artery occlusion and is now s/p R femoral -popliteal artery bypass with iliofemoral thrombectomy and amputation of R 5th ray on 9/30. PMH includes: DM II, COPD, HLD, PVD, and current tobacco use.   Clinical Impression  Pt in bed upon arrival of PT, agreeable to evaluation at this time. Prior to admission the pt was ambulating with use of RW for distances within the home, independent with ADLs, but dependent on family for IADLs. The pt now presents with limitations in functional mobility, power, dynamic stability, and activity tolerance due to above dx, and will continue to benefit from skilled PT to address these deficits. The pt is able to complete bed mobility without assist, but does require min-modA for standing activities due to posterior LOB even with use of BUE support. The pt was able to walk short distances at this time, and will likely progress well to be safe to d/c home with family assist. Has needed DME.         Recommendations for follow up therapy are one component of a multi-disciplinary discharge planning process, led by the attending physician.  Recommendations may be updated based on patient status, additional functional criteria and insurance authorization.  Follow Up Recommendations Home health PT;Supervision for mobility/OOB    Equipment Recommendations  None recommended by PT    Recommendations for Other Services       Precautions / Restrictions Precautions Precautions: Fall Precaution Comments: wound vac RLE Required Braces or Orthoses: Other Brace Other Brace: darco shoe RLE Restrictions Weight Bearing Restrictions: Yes RLE Weight Bearing: Partial weight bearing RLE Partial  Weight Bearing Percentage or Pounds: through heel only in darco shoe      Mobility  Bed Mobility Overal bed mobility: Independent             General bed mobility comments: no assist or increased time    Transfers Overall transfer level: Needs assistance Equipment used: Rolling walker (2 wheeled) Transfers: Sit to/from Stand Sit to Stand: Mod assist         General transfer comment: minA to power up, modA as pt with strong posterior lean on initial stand, frequent loss of balance backwards  Ambulation/Gait Ambulation/Gait assistance: Min assist Gait Distance (Feet): 8 Feet (+ 38ft) Assistive device: Rolling walker (2 wheeled) Gait Pattern/deviations: Step-to pattern;Decreased stance time - left;Decreased weight shift to right Gait velocity: decreased Gait velocity interpretation: <1.31 ft/sec, indicative of household ambulator General Gait Details: pt with step-to pattern to maintain wt bearing, frequent LOB backwards requiring minA to correct      Balance Overall balance assessment: Needs assistance Sitting-balance support: No upper extremity supported;Feet supported Sitting balance-Leahy Scale: Good   Postural control: Posterior lean Standing balance support: Bilateral upper extremity supported Standing balance-Leahy Scale: Poor Standing balance comment: frequent LOB backwards needing min-modA                             Pertinent Vitals/Pain Pain Assessment: 0-10 Pain Score: 6  Pain Location: R foot Pain Descriptors / Indicators: Aching;Throbbing Pain Intervention(s): Limited activity within patient's tolerance;Monitored during session;Repositioned    Home Living Family/patient expects to be discharged to:: Private residence  Living Arrangements: Alone Available Help at Discharge: Family;Other (Comment) (staying with him for 1 week) Type of Home: House Home Access: Stairs to enter Entrance Stairs-Rails: Psychiatric nurse of  Steps: 2 Home Layout: One level Home Equipment: Grab bars - toilet;Grab bars - tub/shower;Walker - 2 wheels;Bedside commode;Wheelchair - manual      Prior Function Level of Independence: Needs assistance   Gait / Transfers Assistance Needed: walks with RW, uses WC for community distance  ADL's / Homemaking Assistance Needed: pt able to shower on his own, reports grocceries brought to him, was cooking but now is not  Comments: pt hopeful to return to driving     Hand Dominance   Dominant Hand: Right    Extremity/Trunk Assessment   Upper Extremity Assessment Upper Extremity Assessment: Overall WFL for tasks assessed    Lower Extremity Assessment Lower Extremity Assessment: RLE deficits/detail;Overall [place] for tasks assessed RLE Deficits / Details: RLE WFL, but limited by pain to assessment. no instances of buckling with stance RLE: Unable to fully assess due to pain RLE Sensation: WNL    Cervical / Trunk Assessment Cervical / Trunk Assessment: Normal  Communication   Communication: No difficulties  Cognition Arousal/Alertness: Awake/alert Behavior During Therapy: WFL for tasks assessed/performed Overall Cognitive Status: Within Functional Limits for tasks assessed                                        General Comments General comments (skin integrity, edema, etc.): VSS on RA    Exercises     Assessment/Plan    PT Assessment Patient needs continued PT services  PT Problem List Decreased strength;Decreased activity tolerance;Decreased balance;Decreased mobility;Decreased coordination;Decreased safety awareness       PT Treatment Interventions DME instruction;Gait training;Stair training;Functional mobility training;Therapeutic activities;Therapeutic exercise;Balance training;Patient/family education    PT Goals (Current goals can be found in the Care Plan section)  Acute Rehab PT Goals Patient Stated Goal: to return home, heal foot PT Goal  Formulation: With patient Time For Goal Achievement: 12/23/20 Potential to Achieve Goals: Fair    Frequency Min 3X/week    AM-PAC PT "6 Clicks" Mobility  Outcome Measure Help needed turning from your back to your side while in a flat bed without using bedrails?: A Little Help needed moving from lying on your back to sitting on the side of a flat bed without using bedrails?: A Little Help needed moving to and from a bed to a chair (including a wheelchair)?: A Little Help needed standing up from a chair using your arms (e.g., wheelchair or bedside chair)?: A Little Help needed to walk in hospital room?: A Little Help needed climbing 3-5 steps with a railing? : A Lot 6 Click Score: 17    End of Session Equipment Utilized During Treatment: Gait belt Activity Tolerance: Patient tolerated treatment well Patient left: in chair;with call bell/phone within reach;with chair alarm set Nurse Communication: Mobility status PT Visit Diagnosis: Other abnormalities of gait and mobility (R26.89);Pain Pain - Right/Left: Right Pain - part of body: Ankle and joints of foot    Time: 1430-1508 PT Time Calculation (min) (ACUTE ONLY): 38 min   Charges:   PT Evaluation $PT Eval Low Complexity: 1 Low PT Treatments $Gait Training: 8-22 mins        West Carbo, PT, DPT   Acute Rehabilitation Department Pager #: [phone]  Sandra Cockayne 12/09/2020, 3:43  PM

## 2020-12-09 NOTE — Progress Notes (Signed)
PROGRESS NOTE  Raymond [name] BCW:[id number] DOB: 27-Jun-1960   PCP: [name], MD  Patient is from: Home.  Lives alone.  Uses rolling walker at baseline.  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  He also had leukocytosis and mild tachycardia.  He was started on IV heparin and IV vancomycin.   Patient underwent right CFA endarterectomy, angioplasty and stenting of right common iliac artery and external iliac artery, right femoropopliteal bypass, right fifth toe ray amputation with VAC placement by Dr. Scot Dock on 12/08/2020.  Subjective: Seen and examined earlier this morning.  No major events overnight of this morning.  No complaints.  Pain fairly controlled.  Denies chest pain, dyspnea, GI or UTI symptoms.  Objective: Vitals:   12/09/20 0331 12/09/20 0832 12/09/20 1017 12/09/20 1247  BP:  115/85 (!) 92/53 105/75  Pulse: 79 92 74 78  Resp: 16 18  16   Temp: 97.9 F (36.6 C) 98.2 F (36.8 C)  98.2 F (36.8 C)  TempSrc: Oral Oral  Oral  SpO2:  99%  98%  Weight:      Height:        Intake/Output Summary (Last 24 hours) at 12/09/2020 1523 Last data filed at 12/09/2020 1248 Gross per 24 hour  Intake --  Output 1010 ml  Net -1010 ml   Filed Weights   12/05/20 2150  Weight: 64 kg    Examination:  GENERAL: No apparent distress.  Nontoxic. HEENT: MMM.  Vision and hearing grossly intact.  NECK: Supple.  No apparent JVD.  RESP: On RA.  No IWOB.  Fair aeration  bilaterally. CVS:  RRR. Heart sounds normal.  ABD/GI/GU: BS+. Abd soft, NTND.  MSK/EXT:  Moves extremities. S/p right fifth ray amputation.  Wound VAC in place.  2+ DP pulses bilaterally. SKIN: Wound VAC over lateral aspect of right foot.  Erythema over dorsal aspect.  NEURO: Awake and alert. Oriented appropriately.  No apparent focal neuro deficit. PSYCH: Calm. Normal affect.   Procedures:  12/08/2020-right CFA endarterectomy, angioplasty and stenting of right common iliac artery and external iliac artery, right femoropopliteal bypass, right fifth toe ray amputation with VAC placement by Dr. Scot Dock  Microbiology summarized: COVID-19 and influenza PCR nonreactive.  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  -Continue inhalers and as needed nebulizers -Encourage smoking cessation  Controlled DM-2: A1c 5.5%.  On metformin at home. Recent Labs  Lab 12/08/20 1557 12/08/20 2115 12/08/20 2320 12/09/20 0528 12/09/20 1241  GLUCAP 164* 411* 335* 217* 141*  -Continue CBG monitoring and SSI -Continue statin.  Iron deficiency anemia: Slight drop in Hgb likely dilutional.  Iron saturation 8%.  Ferritin 21.  TIBC 217 Vitamin B12 deficiency: B12 141. Recent Labs    12/05/20 2321 12/07/20 0149 12/07/20 2359 12/08/20 1312 12/09/20 0325  HGB 11.4* 10.9* 9.9* 10.9* 9.6*  -Vitamin B12 injection 1000 mcg x 1 -Monitor  Hypokalemia -Replenish and recheck  Chronic pain syndrome -Pain management as above  GERD -Continue Protonix  Leukocytosis: Suspect this to be  demargination versus infection.  Improved. -Continue monitoring  Tobacco use disorder: About 2 packs a day. -Encourage cessation -Nicotine patch -Already on Wellbutrin  Anxiety and depression: Stable. -Continue home meds  Moderate malnutrition/hypoalbuminemia: As evidenced by significant muscle mass and subcu fat loss and hypoalbuminemia Body mass index is 21.44 kg/m.  -Consult dietitian -Check prealbumin in the morning       DVT prophylaxis:  heparin injection 5,000 Units Start: 12/09/20 0600 SCD's Start: 12/08/20 1547   Code Status: Full code Family Communication: Patient and/or RN. Available if any question.  Level of care: Progressive Status is: Inpatient    Dispo: The patient is from: Home              Anticipated d/c is to:  To be determined              Patient currently is not medically stable to d/c.   Difficult to place patient No       Consultants:  Vascular surgery   Sch Meds:  Scheduled Meds:  allopurinol  100 mg Oral q AM   aspirin EC  81 mg Oral Q0600   buPROPion  150 mg Oral Q0600   Chlorhexidine Gluconate Cloth  6 each Topical Q0600   clopidogrel  75 mg Oral Q0600   docusate sodium  100 mg Oral Daily   gabapentin  300 mg Oral TID   heparin  5,000 Units Subcutaneous Q8H   insulin aspart  0-9 Units Subcutaneous TID WC   insulin glargine-yfgn  10 Units Subcutaneous Daily   magnesium oxide  400 mg Oral Daily   metoprolol tartrate  12.5 mg Oral BID   mupirocin ointment  1 application Nasal BID   nicotine  21 mg Transdermal Daily   pantoprazole  80 mg Oral Daily   rosuvastatin  20 mg Oral QHS   sodium chloride flush  3 mL Intravenous Q12H   tamsulosin  0.4 mg Oral q AM   Continuous Infusions:  sodium chloride     sodium chloride     vancomycin 1,000 mg (12/09/20 1308)   PRN Meds:.sodium chloride, sodium chloride, acetaminophen **OR** acetaminophen, albuterol, calcium carbonate, clonazePAM, guaiFENesin-dextromethorphan, hydrALAZINE,  HYDROmorphone (DILAUDID) injection, labetalol, metoprolol tartrate, ondansetron **OR** ondansetron (ZOFRAN) IV, oxyCODONE-acetaminophen, phenol, senna-docusate, sodium chloride flush  Antimicrobials: Anti-infectives (From admission, onward)    Start     Dose/Rate Route Frequency Ordered Stop   12/06/20 1300  vancomycin (VANCOCIN) IVPB 1000 mg/200 mL premix        1,000 mg 200 mL/hr over 60 Minutes Intravenous Every 12 hours 12/06/20 0137     12/06/20 0100  vancomycin (VANCOREADY) IVPB 1250 mg/250 mL        1,250 mg 166.7 mL/hr over 90 Minutes Intravenous  Once 12/06/20 0048 12/06/20 0358        I have personally reviewed the following labs and images: CBC: Recent Labs  Lab 12/05/20 2321 12/07/20 0149 12/07/20 2359 12/08/20 1312 12/09/20 0325  WBC 17.7* 15.6* 11.5*  --  14.1*  NEUTROABS 12.8*  --   --   --   --   HGB 11.4* 10.9*  9.9* 10.9* 9.6*  HCT 35.0* 35.1* 31.1* 32.0* 30.1*  MCV 84.1 84.6 83.8  --  83.1  PLT 428* 416* 374  --  388   BMP &GFR Recent Labs  Lab 12/05/20 2321 12/07/20 0149 12/07/20 2359 12/08/20 1312 12/09/20 0325  NA 132* 135 135 141 134*  K 3.9 4.2 3.4* 3.6 4.0  CL 98 101 100  --  103  CO2 29 27 28   --  25  GLUCOSE 104* 72 117*  --  165*  BUN 7 5* 7  --  7  CREATININE 0.43* 0.83 0.85  --  0.71  CALCIUM 7.7* 7.9* 7.5*  --  7.7*  MG  --   --  1.9  --  1.8  PHOS  --   --  4.0  --  3.8   Estimated Creatinine Clearance: 90 mL/min (by C-G formula based on SCr of 0.71 mg/dL). Liver & Pancreas: Recent Labs  Lab 12/05/20 2321 12/07/20 0149 12/07/20 2359 12/09/20 0325  AST 12* 13*  --   --   ALT 15 13  --   --   ALKPHOS 146* 136*  --   --   BILITOT 0.2* 0.1*  --   --   PROT 5.4* 4.8*  --   --   ALBUMIN 1.9* 1.6* <1.5* <1.5*   No results for input(s): LIPASE, AMYLASE in the last 168 hours. No results for input(s): AMMONIA in the last 168 hours. Diabetic: Recent Labs    12/06/20 1958  HGBA1C 5.5   Recent Labs  Lab 12/08/20 1557  12/08/20 2115 12/08/20 2320 12/09/20 0528 12/09/20 1241  GLUCAP 164* 411* 335* 217* 141*   Cardiac Enzymes: No results for input(s): CKTOTAL, CKMB, CKMBINDEX, TROPONINI in the last 168 hours. No results for input(s): PROBNP in the last 8760 hours. Coagulation Profile: No results for input(s): INR, PROTIME in the last 168 hours. Thyroid Function Tests: No results for input(s): TSH, T4TOTAL, FREET4, T3FREE, THYROIDAB in the last 72 hours. Lipid Profile: Recent Labs    12/09/20 0335  CHOL 83  HDL 35*  LDLCALC 31  TRIG 85  CHOLHDL 2.4   Anemia Panel: Recent Labs    12/09/20 0325 12/09/20 0335  VITAMINB12 141*  --   FOLATE  --  6.6  FERRITIN 21*  --   TIBC 217*  --   IRON 18*  --   RETICCTPCT 1.7  --    Urine analysis:    Component Value Date/Time   COLORURINE YELLOW 11/11/2009 1455   APPEARANCEUR CLEAR 11/11/2009 1455   LABSPEC <1.005 (L) 11/11/2009 1455   PHURINE 6.5 11/11/2009 1455   GLUCOSEU NEGATIVE 11/11/2009 1455   HGBUR NEGATIVE 11/11/2009 1455   BILIRUBINUR NEGATIVE 11/11/2009 1455   KETONESUR NEGATIVE 11/11/2009 1455   PROTEINUR NEGATIVE 11/11/2009 1455   UROBILINOGEN 0.2 11/11/2009 1455   NITRITE NEGATIVE 11/11/2009 1455   LEUKOCYTESUR  11/11/2009 1455    NEGATIVE MICROSCOPIC NOT DONE ON URINES WITH NEGATIVE PROTEIN, BLOOD, LEUKOCYTES, NITRITE, OR GLUCOSE <1000 mg/dL.   Sepsis Labs: Invalid input(s): PROCALCITONIN, [place]  Microbiology: Recent Results (from the past 240 hour(s))  Resp Panel by RT-PCR (Flu A&B, Covid) Nasopharyngeal Swab     Status: None   Collection Time: 12/06/20  4:32 AM   Specimen: Nasopharyngeal Swab; Nasopharyngeal(NP) swabs in vial transport medium  Result Value Ref Range Status   SARS Coronavirus 2 by RT PCR NEGATIVE NEGATIVE Final    Comment: (NOTE) SARS-CoV-2 target nucleic acids are NOT DETECTED.  The  SARS-CoV-2 RNA is generally detectable in upper respiratory specimens during the acute phase of infection. The  lowest concentration of SARS-CoV-2 viral copies this assay can detect is 138 copies/mL. A negative result does not preclude SARS-Cov-2 infection and should not be used as the sole basis for treatment or other patient management decisions. A negative result may occur with  improper specimen collection/handling, submission of specimen other than nasopharyngeal swab, presence of viral mutation(s) within the areas targeted by this assay, and inadequate number of viral copies(<138 copies/mL). A negative result must be combined with clinical observations, patient history, and epidemiological information. The expected result is Negative.  Fact Sheet for Patients:  EntrepreneurPulse.com.au  Fact Sheet for Healthcare Providers:  IncredibleEmployment.be  This test is no t yet approved or cleared by the [place] FDA and  has been authorized for detection and/or diagnosis of SARS-CoV-2 by FDA under an Emergency Use Authorization (EUA). This EUA will remain  in effect (meaning this test can be used) for the duration of the COVID-19 declaration under Section 564(b)(1) of the Act, 21 U.S.C.section 360bbb-3(b)(1), unless the authorization is terminated  or revoked sooner.       Influenza A by PCR NEGATIVE NEGATIVE Final   Influenza B by PCR NEGATIVE NEGATIVE Final    Comment: (NOTE) The Xpert Xpress SARS-CoV-2/FLU/RSV plus assay is intended as an aid in the diagnosis of influenza from Nasopharyngeal swab specimens and should not be used as a sole basis for treatment. Nasal washings and aspirates are unacceptable for Xpert Xpress SARS-CoV-2/FLU/RSV testing.  Fact Sheet for Patients: EntrepreneurPulse.com.au  Fact Sheet for Healthcare Providers: IncredibleEmployment.be  This test is not yet approved or cleared by the [place] FDA and has been authorized for detection and/or diagnosis of SARS-CoV-2 by FDA under  an Emergency Use Authorization (EUA). This EUA will remain in effect (meaning this test can be used) for the duration of the COVID-19 declaration under Section 564(b)(1) of the Act, 21 U.S.C. section 360bbb-3(b)(1), unless the authorization is terminated or revoked.  Performed at [facility], [address], [place], [place] [address]   MRSA Next Gen by PCR, Nasal     Status: Abnormal   Collection Time: 12/08/20  5:24 AM   Specimen: Nasal Mucosa; Nasal Swab  Result Value Ref Range Status   MRSA by PCR Next Gen DETECTED (A) NOT DETECTED Final    Comment: RESULT CALLED TO, READ BACK BY AND VERIFIED WITH: H WIESNER,RN@0717  12/08/20 [place] (NOTE) The GeneXpert MRSA Assay (FDA approved for NASAL specimens only), is one component of a comprehensive MRSA colonization surveillance program. It is not intended to diagnose MRSA infection nor to guide or monitor treatment for MRSA infections. Test performance is not FDA approved in patients less than 73 years old. Performed at [facility], [place] [address], [place], [place] [address]     Radiology Studies: No results found.    Keefe Zawistowski T. [place]  If 7PM-7AM, please contact night-coverage [email or web address] 12/09/2020, 3:23 PM

## 2020-12-09 NOTE — Progress Notes (Signed)
PHARMACIST LIPID MONITORING  Raymond [name] is a 60 y.o. male admitted on 12/05/2020 with Right common/iliac artery thrombosis and multivessel PAD in right lower extremity.  Pharmacy has been consulted to optimize lipid-lowering therapy with the indication of secondary prevention for clinical ASCVD.  Recent Labs:  Lipid Panel (last 6 months):   Lab Results  Component Value Date   CHOL 83 12/09/2020   TRIG 85 12/09/2020   HDL 35 (L) 12/09/2020   CHOLHDL 2.4 12/09/2020   VLDL 17 12/09/2020   LDLCALC 31 12/09/2020    Hepatic function panel (last 6 months):   Lab Results  Component Value Date   AST 13 (L) 12/07/2020   ALT 13 12/07/2020   ALKPHOS 136 (H) 12/07/2020   BILITOT 0.1 (L) 12/07/2020    SCr (since admission):   Serum creatinine: 0.71 mg/dL 12/09/20 0325 Estimated creatinine clearance: 90 mL/min  Current therapy and lipid therapy tolerance Current lipid-lowering therapy: rosuvastatin 10 mg daily Previous lipid-lowering therapies (if applicable): N/A Documented or reported allergies or intolerances to lipid-lowering therapies (if applicable): None  Assessment:   Patient agrees with changes to lipid-lowering therapy  Plan:    1.Statin intensity (high intensity recommended for all patients regardless of the LDL):  Add or increase statin to high intensity. Increase to Rosuvastatin 20 mg daily  2.Add ezetimibe (if any one of the following):   Not indicated at this time.  3.Refer to lipid clinic:   No  4.Follow-up with:  Primary care provider - [name], MD  5.Follow-up labs after discharge:  Changes in lipid therapy were made. Check a lipid panel in 8-12 weeks then annually.       Zenaida Deed, PharmD PGY1 Acute Care Pharmacy Resident  Phone: [phone] 12/09/2020  3:09 PM  Please check [email or web address] for unit-specific pharmacy phone numbers.

## 2020-12-10 DIAGNOSIS — I745 Embolism and thrombosis of iliac artery: Secondary | ICD-10-CM | POA: Diagnosis not present

## 2020-12-10 DIAGNOSIS — F419 Anxiety disorder, unspecified: Secondary | ICD-10-CM | POA: Diagnosis not present

## 2020-12-10 DIAGNOSIS — I70231 Atherosclerosis of native arteries of right leg with ulceration of thigh: Secondary | ICD-10-CM | POA: Diagnosis not present

## 2020-12-10 DIAGNOSIS — G894 Chronic pain syndrome: Secondary | ICD-10-CM | POA: Diagnosis not present

## 2020-12-10 LAB — RENAL FUNCTION PANEL
Albumin: <1.5 g/dL — ABNORMAL LOW (ref 3.5–5.0)
Anion gap: 9 (ref 5–15)
BUN: 7 mg/dL (ref 6–20)
CO2: 25 mmol/L (ref 22–32)
Calcium: 7.7 mg/dL — ABNORMAL LOW (ref 8.9–10.3)
Chloride: 102 mmol/L (ref 98–111)
Creatinine, Ser: 0.7 mg/dL (ref 0.61–1.24)
GFR, Estimated: >60 mL/min (ref >60)
Glucose, Bld: 79 mg/dL (ref 70–99)
Phosphorus: 4 mg/dL (ref 2.5–4.6)
Potassium: 3.9 mmol/L (ref 3.5–5.1)
Sodium: 136 mmol/L (ref 135–145)

## 2020-12-10 LAB — CBC
HCT: 31.9 % — ABNORMAL LOW (ref 39.0–52.0)
Hemoglobin: 10.2 g/dL — ABNORMAL LOW (ref 13.0–17.0)
MCH: 26.7 pg (ref 26.0–34.0)
MCHC: 32 g/dL (ref 30.0–36.0)
MCV: 83.5 fL (ref 80.0–100.0)
Platelets: 414 10*3/uL — ABNORMAL HIGH (ref 150–400)
RBC: 3.82 MIL/uL — ABNORMAL LOW (ref 4.22–5.81)
RDW: 17.1 % — ABNORMAL HIGH (ref 11.5–15.5)
WBC: 13 10*3/uL — ABNORMAL HIGH (ref 4.0–10.5)
nRBC: 0 % (ref 0.0–0.2)

## 2020-12-10 LAB — MAGNESIUM: Magnesium: 1.8 mg/dL (ref 1.7–2.4)

## 2020-12-10 LAB — GLUCOSE, CAPILLARY
Glucose-Capillary: 73 mg/dL (ref 70–99)
Glucose-Capillary: 79 mg/dL (ref 70–99)
Glucose-Capillary: 128 mg/dL — ABNORMAL HIGH (ref 70–99)
Glucose-Capillary: 110 mg/dL — ABNORMAL HIGH (ref 70–99)

## 2020-12-10 LAB — PREALBUMIN: Prealbumin: 8 mg/dL — ABNORMAL LOW (ref 18–38)

## 2020-12-10 MED ORDER — ENSURE ENLIVE PO LIQD
237.0000 mL | Freq: Two times a day (BID) | ORAL | Status: DC
  Administered 2020-12-10 – 2020-12-11 (×2): 237 mL via ORAL

## 2020-12-10 MED ORDER — CYCLOBENZAPRINE HCL 10 MG PO TABS
5.0000 mg | ORAL_TABLET | Freq: Every day | ORAL | Status: DC
  Administered 2020-12-10 – 2020-12-12 (×3): 5 mg via ORAL
  Filled 2020-12-10 (×3): qty 1

## 2020-12-10 MED ORDER — ADULT MULTIVITAMIN W/MINERALS CH
1.0000 | ORAL_TABLET | Freq: Every day | ORAL | Status: DC
  Administered 2020-12-10 – 2020-12-13 (×4): 1 via ORAL
  Filled 2020-12-10 (×4): qty 1

## 2020-12-10 NOTE — Progress Notes (Signed)
Patient's BP rechecked at R arm; BP- 131/82. Patient stated that he has  "blockage in his left side neck" and never gets a good BP reading from his arm.

## 2020-12-10 NOTE — Progress Notes (Signed)
Initial Nutrition Assessment  DOCUMENTATION CODES:   Not applicable  INTERVENTION:   -Magic cup BID with meals, each supplement provides 290 kcal and 9 grams of protein  -Ensure Enlive po BID, each supplement provides 350 kcal and 20 grams of protein  -MVI with minerals daily  NUTRITION DIAGNOSIS:   Increased nutrient needs related to wound healing, post-op healing as evidenced by estimated needs.  GOAL:   Patient will meet greater than or equal to 90% of their needs  MONITOR:   PO intake, Supplement acceptance, Labs, Weight trends, Skin, I & O's  REASON FOR ASSESSMENT:   Consult Assessment of nutrition requirement/status  ASSESSMENT:   Raymond [name] is a 60 y.o. male with medical history significant of COPD on chronic oxygen at home prn, T2DM, GERD, chronic pain on oxycodone, HLD, gout, anxiety and depression and neuropathy who presented to ED for worsening right foot pain and ulceration.  He also fell into the door jam onto his left side. He has been unable to walk due to a fall about 6 months ago. He is able to take a few steps with a walker, but is very unstable and painful.  He has had no fever/chills. He has had no known history that he is aware of of PVD.  He states he started to have right foot pain with an ulcer about 3 weeks ago and it has progressively gotten worse. Pain rated as a 9/10 and sharp and stabbing in nature and is constant. Pain medicine does help some. Movement makes it worse and when pain meds wear off. Also states he does have pain in his calves when he walks, but history is difficult to really grasp on this and for how long.  Pt admitted with occlusion of rt common/ external iliac artery with gangrene of rt foot.   9/30- s/p Right common femoral artery endarterectomy with vein patch angioplasty; Harvesting of left great saphenous vein;Angioplasty and stenting of right common iliac artery and external iliac artery with 8 mm x 79 mm VBX stent; Right femoral  to below-knee popliteal artery bypass with 6 mm ringed PTFE graft; Ray amputation of the right fifth toe; Placement of VAC  Reviewed I/O's: -1.8 L x 24 hours and -6.3 L since admission  UOP:1 .9 L x 24 hours  Pt unavailable at time of visit. Attempted to speak with pt via call to hospital room phone, however, unable to reach. RD unable to obtain further nutrition-related history or complete nutrition-focused physical exam at this time.    Reviewed wt hx; pt has experienced a 16.1% wt loss over the past 6 months, which is significant for time frame.   Medications reviewed and include colace and vitamin B-12.   Lab Results  Component Value Date   HGBA1C 5.5 12/06/2020   PTA DM medications are 500 mg metformin BID.   Labs reviewed: CBGS: 73-102 (inpatient orders for glycemic control are 0-9 units insulin aspart TID with meals and 10 units insulin glargine-yfgn daily).    Diet Order:   Diet Order             Diet Carb Modified Fluid consistency: Thin; Room service appropriate? Yes with Assist  Diet effective now                   EDUCATION NEEDS:   No education needs have been identified at this time  Skin:  Skin Assessment: Skin Integrity Issues: Skin Integrity Issues:: Wound VAC, Incisions Wound Vac: rt foot  s/p 5th ray amputation Incisions: closed lt leg and rt groin; wounds to lt ischial tuberosity and mid sacrum  Last BM:  12/09/20  Height:   Ht Readings from Last 1 Encounters:  12/05/20 5\' 8"  (1.727 m)    Weight:   Wt Readings from Last 1 Encounters:  12/05/20 64 kg    Ideal Body Weight:  70 kg  BMI:  Body mass index is 21.44 kg/m.  Estimated Nutritional Needs:   Kcal:  2050-2250  Protein:  110-125 grams  Fluid:  > 2 L    Loistine Chance, RD, LDN, [place] Registered Dietitian II Certified Diabetes Care and Education Specialist Please refer to [facility] for RD and/or RD on-call/weekend/after hours pager

## 2020-12-10 NOTE — Progress Notes (Signed)
PROGRESS NOTE  Raymond [name] HWE:[id number] DOB: 1960-12-20   PCP: [name], MD  Patient is from: Home.  Lives alone.  Uses rolling walker at baseline.  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  He also had leukocytosis and mild tachycardia.  He was started on IV heparin and IV vancomycin.   Patient underwent right CFA endarterectomy, angioplasty and stenting of right common iliac artery and external iliac artery, right femoropopliteal bypass, right fifth toe ray amputation with VAC placement by Dr. Scot Dock on 12/08/2020.  Subjective: Seen and examined earlier this morning.  No major events overnight of this morning.  Reports difficulty sleeping.  He reports using Flexeril to help him sleep.  He denies pain, shortness of breath, GI or UTI symptoms.  Objective: Vitals:   12/10/20 0352 12/10/20 0455 12/10/20 0900 12/10/20 1120  BP:  131/82 120/64 133/73  Pulse:   77 71  Resp: 14 18 12 18   Temp:   98.4 F (36.9 C) 98.5 F (36.9 C)  TempSrc:   Oral Oral  SpO2:   99% 100%  Weight:      Height:        Intake/Output Summary (Last 24 hours) at 12/10/2020 1432 Last data filed at 12/10/2020 1330 Gross per 24 hour  Intake 605 ml  Output 1850 ml  Net -1245 ml   Filed Weights   12/05/20 2150  Weight: 64 kg    Examination:  GENERAL: No apparent distress.  Nontoxic. HEENT: MMM.  Vision and hearing grossly intact.  NECK: Supple.  No apparent JVD.  RESP:  No IWOB.   Fair aeration bilaterally. CVS:  RRR. Heart sounds normal.  ABD/GI/GU: BS+. Abd soft, NTND.  MSK/EXT:  Moves extremities. S/p right fifth ray amputation.  Wound VAC in place.  2+ DP pulses bilaterally. SKIN: Wound VAC over lateral aspect of right foot.  Erythema over dorsal aspect improved. NEURO: Awake and alert. Oriented appropriately.  No apparent focal neuro deficit. PSYCH: Calm. Normal affect.   Procedures:  12/08/2020-right CFA endarterectomy, angioplasty and stenting of right common iliac artery and external iliac artery, right femoropopliteal bypass, right fifth toe ray amputation with VAC placement by Dr. Scot Dock  Microbiology summarized: COVID-19 and influenza PCR nonreactive.  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 -Bowel regimen  Chronic COPD: Stable.  -Continue inhalers and as needed nebulizers -Encourage smoking cessation  Controlled DM-2: A1c 5.5%.  On metformin at home. Recent Labs  Lab 12/09/20 1241 12/09/20 1611 12/09/20 2013 12/10/20 0625 12/10/20 1123  GLUCAP 141* 107* 102* 73 79  -Continue CBG monitoring and SSI -Continue statin.  Iron deficiency anemia: Slight drop in Hgb likely  dilutional.  Iron saturation 8%.  Ferritin 21.  TIBC 217 Vitamin B12 deficiency: B12 141. Recent Labs    12/05/20 2321 12/07/20 0149 12/07/20 2359 12/08/20 1312 12/09/20 0325 12/10/20 0433  HGB 11.4* 10.9* 9.9* 10.9* 9.6* 10.2*  -Vitamin B12 injection 1000 mcg x 1, then p.o. vitamin B12 -Monitor  Hypokalemia: Resolved.  Chronic pain syndrome -Pain management as  above  GERD -Continue Protonix  Leukocytosis: Suspect this to be demargination versus infection.  Improved. -Continue monitoring  Tobacco use disorder: About 2 packs a day. -Encouraged cessation -Nicotine patch -Already on Wellbutrin  Anxiety/depression/insomnia: Stable. -Continue home meds -Added Flexeril at night  Moderate malnutrition/hypoalbuminemia: As evidenced by significant muscle mass and subcu fat loss and hypoalbuminemia and low prealbumin Body mass index is 21.44 kg/m. Nutrition Problem: Increased nutrient needs Etiology: wound healing, post-op healing Signs/Symptoms: estimated needs Interventions: Ensure Enlive (each supplement provides 350kcal and 20 grams of protein), MVI   DVT prophylaxis:  heparin injection 5,000 Units Start: 12/09/20 0600 SCD's Start: 12/08/20 1547   Code Status: Full code Family Communication: Patient and/or RN. Available if any question.  Level of care: Progressive Status is: Inpatient    Dispo: The patient is from: Home              Anticipated d/c is to:  Home with home health once cleared by vascular surgery              Patient currently is not medically stable to d/c.   Difficult to place patient No       Consultants:  Vascular surgery   Sch Meds:  Scheduled Meds:  allopurinol  100 mg Oral q AM   aspirin EC  81 mg Oral Q0600   buPROPion  150 mg Oral Q0600   Chlorhexidine Gluconate Cloth  6 each Topical Q0600   clopidogrel  75 mg Oral Q0600   cyclobenzaprine  5 mg Oral QHS   docusate sodium  100 mg Oral Daily   feeding supplement  237 mL Oral BID BM   gabapentin  300 mg Oral TID   heparin  5,000 Units Subcutaneous Q8H   insulin aspart  0-9 Units Subcutaneous TID WC   insulin glargine-yfgn  10 Units Subcutaneous Daily   magnesium oxide  400 mg Oral Daily   metoprolol tartrate  12.5 mg Oral BID   multivitamin with minerals  1 tablet Oral Daily   mupirocin ointment  1 application Nasal BID   nicotine  21 mg  Transdermal Daily   pantoprazole  80 mg Oral Daily   rosuvastatin  20 mg Oral QHS   sodium chloride flush  3 mL Intravenous Q12H   sulfamethoxazole-trimethoprim  1 tablet Oral Q12H   tamsulosin  0.4 mg Oral q AM   vitamin B-12  1,000 mcg Oral Daily   Continuous Infusions:  sodium chloride     sodium chloride     PRN Meds:.sodium chloride, sodium chloride, acetaminophen **OR** acetaminophen, albuterol, calcium carbonate, clonazePAM, guaiFENesin-dextromethorphan, hydrALAZINE, HYDROmorphone (DILAUDID) injection, labetalol, metoprolol tartrate, ondansetron **OR** ondansetron (ZOFRAN) IV, oxyCODONE-acetaminophen, phenol, senna-docusate, sodium chloride flush  Antimicrobials: Anti-infectives (From admission, onward)    Start     Dose/Rate Route Frequency Ordered Stop   12/09/20 2200  sulfamethoxazole-trimethoprim (BACTRIM DS) 800-160 MG per tablet 1 tablet        1 tablet Oral Every 12 hours 12/09/20 1534     12/06/20 1300  vancomycin (VANCOCIN) IVPB 1000 mg/200 mL premix  Status:  Discontinued  1,000 mg 200 mL/hr over 60 Minutes Intravenous Every 12 hours 12/06/20 0137 12/09/20 1534   12/06/20 0100  vancomycin (VANCOREADY) IVPB 1250 mg/250 mL        1,250 mg 166.7 mL/hr over 90 Minutes Intravenous  Once 12/06/20 0048 12/06/20 0358        I have personally reviewed the following labs and images: CBC: Recent Labs  Lab 12/05/20 2321 12/07/20 0149 12/07/20 2359 12/08/20 1312 12/09/20 0325 12/10/20 0433  WBC 17.7* 15.6* 11.5*  --  14.1* 13.0*  NEUTROABS 12.8*  --   --   --   --   --   HGB 11.4* 10.9* 9.9* 10.9* 9.6* 10.2*  HCT 35.0* 35.1* 31.1* 32.0* 30.1* 31.9*  MCV 84.1 84.6 83.8  --  83.1 83.5  PLT 428* 416* 374  --  388 414*   BMP &GFR Recent Labs  Lab 12/05/20 2321 12/07/20 0149 12/07/20 2359 12/08/20 1312 12/09/20 0325 12/10/20 0641  NA 132* 135 135 141 134* 136  K 3.9 4.2 3.4* 3.6 4.0 3.9  CL 98 101 100  --  103 102  CO2 29 27 28   --  25 25  GLUCOSE  104* 72 117*  --  165* 79  BUN 7 5* 7  --  7 7  CREATININE 0.43* 0.83 0.85  --  0.71 0.70  CALCIUM 7.7* 7.9* 7.5*  --  7.7* 7.7*  MG  --   --  1.9  --  1.8 1.8  PHOS  --   --  4.0  --  3.8 4.0   Estimated Creatinine Clearance: 90 mL/min (by C-G formula based on SCr of 0.7 mg/dL). Liver & Pancreas: Recent Labs  Lab 12/05/20 2321 12/07/20 0149 12/07/20 2359 12/09/20 0325 12/10/20 0641  AST 12* 13*  --   --   --   ALT 15 13  --   --   --   ALKPHOS 146* 136*  --   --   --   BILITOT 0.2* 0.1*  --   --   --   PROT 5.4* 4.8*  --   --   --   ALBUMIN 1.9* 1.6* <1.5* <1.5* <1.5*   No results for input(s): LIPASE, AMYLASE in the last 168 hours. No results for input(s): AMMONIA in the last 168 hours. Diabetic: No results for input(s): HGBA1C in the last 72 hours.  Recent Labs  Lab 12/09/20 1241 12/09/20 1611 12/09/20 2013 12/10/20 0625 12/10/20 1123  GLUCAP 141* 107* 102* 73 79   Cardiac Enzymes: No results for input(s): CKTOTAL, CKMB, CKMBINDEX, TROPONINI in the last 168 hours. No results for input(s): PROBNP in the last 8760 hours. Coagulation Profile: No results for input(s): INR, PROTIME in the last 168 hours. Thyroid Function Tests: No results for input(s): TSH, T4TOTAL, FREET4, T3FREE, THYROIDAB in the last 72 hours. Lipid Profile: Recent Labs    12/09/20 0335  CHOL 83  HDL 35*  LDLCALC 31  TRIG 85  CHOLHDL 2.4   Anemia Panel: Recent Labs    12/09/20 0325 12/09/20 0335  VITAMINB12 141*  --   FOLATE  --  6.6  FERRITIN 21*  --   TIBC 217*  --   IRON 18*  --   RETICCTPCT 1.7  --    Urine analysis:    Component Value Date/Time   COLORURINE YELLOW 11/11/2009 1455   APPEARANCEUR CLEAR 11/11/2009 1455   LABSPEC <1.005 (L) 11/11/2009 1455   PHURINE 6.5 11/11/2009 1455   GLUCOSEU NEGATIVE 11/11/2009  [place] 11/11/2009 [place] 11/11/2009 1455   KETONESUR NEGATIVE 11/11/2009 1455   PROTEINUR NEGATIVE 11/11/2009 1455    UROBILINOGEN 0.2 11/11/2009 1455   NITRITE NEGATIVE 11/11/2009 1455   LEUKOCYTESUR  11/11/2009 1455    NEGATIVE MICROSCOPIC NOT DONE ON URINES WITH NEGATIVE PROTEIN, BLOOD, LEUKOCYTES, NITRITE, OR GLUCOSE <1000 mg/dL.   Sepsis Labs: Invalid input(s): PROCALCITONIN, [place]  Microbiology: Recent Results (from the past 240 hour(s))  Resp Panel by RT-PCR (Flu A&B, Covid) Nasopharyngeal Swab     Status: None   Collection Time: 12/06/20  4:32 AM   Specimen: Nasopharyngeal Swab; Nasopharyngeal(NP) swabs in vial transport medium  Result Value Ref Range Status   SARS Coronavirus 2 by RT PCR NEGATIVE NEGATIVE Final    Comment: (NOTE) SARS-CoV-2 target nucleic acids are NOT DETECTED.  The SARS-CoV-2 RNA is generally detectable in upper respiratory specimens during the acute phase of infection. The lowest concentration of SARS-CoV-2 viral copies this assay can detect is 138 copies/mL. A negative result does not preclude SARS-Cov-2 infection and should not be used as the sole basis for treatment or other patient management decisions. A negative result may occur with  improper specimen collection/handling, submission of specimen other than nasopharyngeal swab, presence of viral mutation(s) within the areas targeted by this assay, and inadequate number of viral copies(<138 copies/mL). A negative result must be combined with clinical observations, patient history, and epidemiological information. The expected result is Negative.  Fact Sheet for Patients:  EntrepreneurPulse.com.au  Fact Sheet for Healthcare Providers:  IncredibleEmployment.be  This test is no t yet approved or cleared by the [place] FDA and  has been authorized for detection and/or diagnosis of SARS-CoV-2 by FDA under an Emergency Use Authorization (EUA). This EUA will remain  in effect (meaning this test can be used) for the duration of the COVID-19 declaration under Section  564(b)(1) of the Act, 21 U.S.C.section 360bbb-3(b)(1), unless the authorization is terminated  or revoked sooner.       Influenza A by PCR NEGATIVE NEGATIVE Final   Influenza B by PCR NEGATIVE NEGATIVE Final    Comment: (NOTE) The Xpert Xpress SARS-CoV-2/FLU/RSV plus assay is intended as an aid in the diagnosis of influenza from Nasopharyngeal swab specimens and should not be used as a sole basis for treatment. Nasal washings and aspirates are unacceptable for Xpert Xpress SARS-CoV-2/FLU/RSV testing.  Fact Sheet for Patients: EntrepreneurPulse.com.au  Fact Sheet for Healthcare Providers: IncredibleEmployment.be  This test is not yet approved or cleared by the [place] FDA and has been authorized for detection and/or diagnosis of SARS-CoV-2 by FDA under an Emergency Use Authorization (EUA). This EUA will remain in effect (meaning this test can be used) for the duration of the COVID-19 declaration under Section 564(b)(1) of the Act, 21 U.S.C. section 360bbb-3(b)(1), unless the authorization is terminated or revoked.  Performed at [facility], [address] [place], [place] [address]   MRSA Next Gen by PCR, Nasal     Status: Abnormal   Collection Time: 12/08/20  5:24 AM   Specimen: Nasal Mucosa; Nasal Swab  Result Value Ref Range Status   MRSA by PCR Next Gen DETECTED (A) NOT DETECTED Final    Comment: RESULT CALLED TO, READ BACK BY AND VERIFIED WITH: H WIESNER,RN@0717  12/08/20 [place] (NOTE) The GeneXpert MRSA Assay (FDA approved for NASAL specimens only), is one component of a comprehensive MRSA colonization surveillance program. It is not intended to diagnose MRSA infection nor to guide or  monitor treatment for MRSA infections. Test performance is not FDA approved in patients less than 67 years old. Performed at [facility], [place] [address], [place], [place] [address]     Radiology Studies: No results found.    Jacayla Nordell T.  [place]  If 7PM-7AM, please contact night-coverage [email or web address] 12/10/2020, 2:32 PM

## 2020-12-10 NOTE — Progress Notes (Signed)
    Subjective  - POD #2  C/o right leg soreness Was OOB and walking yesterday   Physical Exam:  Right toe vac in place with good seal Brisk right DP signal Incisions c/d/i       Assessment/Plan:  POD #2  Vac change tomorrow On ASA, Plavix, statin SQ heparin and protonix for prophylaxis Continue work with PT  Annamarie Major 12/10/2020 7:48 AM --  Vitals:   12/10/20 0352 12/10/20 0455  BP:  131/82  Pulse:    Resp: 14 18  Temp:    SpO2:      Intake/Output Summary (Last 24 hours) at 12/10/2020 0748 Last data filed at 12/10/2020 0601 Gross per 24 hour  Intake 125 ml  Output 1900 ml  Net -1775 ml     Laboratory CBC    Component Value Date/Time   WBC 13.0 (H) 12/10/2020 0433   HGB 10.2 (L) 12/10/2020 0433   HGB 15.6 09/25/2012 1554   HCT 31.9 (L) 12/10/2020 0433   HCT 44 09/25/2012 1554   PLT 414 (H) 12/10/2020 0433    BMET    Component Value Date/Time   NA 134 (L) 12/09/2020 0325   NA 141 09/25/2012 1554   K 4.0 12/09/2020 0325   K 4.1 09/25/2012 1554   CL 103 12/09/2020 0325   CO2 25 12/09/2020 0325   GLUCOSE 165 (H) 12/09/2020 0325   BUN 7 12/09/2020 0325   BUN 12 09/25/2012 1554   CREATININE 0.71 12/09/2020 0325   CREATININE 1.09 09/25/2012 1554   CALCIUM 7.7 (L) 12/09/2020 0325   CALCIUM 8.9 09/25/2012 1554   GFRNONAA >60 12/09/2020 0325   GFRAA >60 06/24/2017 0515    COAG Lab Results  Component Value Date   INR 1.04 01/22/2009   INR 0.95 01/22/2009   No results found for: PTT  Antibiotics Anti-infectives (From admission, onward)    Start     Dose/Rate Route Frequency Ordered Stop   12/09/20 2200  sulfamethoxazole-trimethoprim (BACTRIM DS) 800-160 MG per tablet 1 tablet        1 tablet Oral Every 12 hours 12/09/20 1534     12/06/20 1300  vancomycin (VANCOCIN) IVPB 1000 mg/200 mL premix  Status:  Discontinued        1,000 mg 200 mL/hr over 60 Minutes Intravenous Every 12 hours 12/06/20 0137 12/09/20 1534   12/06/20 0100   vancomycin (VANCOREADY) IVPB 1250 mg/250 mL        1,250 mg 166.7 mL/hr over 90 Minutes Intravenous  Once 12/06/20 0048 12/06/20 0358        V. Leia Alf, M.D., [facility] [place] Office: [phone] Pager:  [phone]

## 2020-12-10 NOTE — Evaluation (Signed)
Occupational Therapy Evaluation Patient Details Name: Raymond [name] MRN: [id number] DOB: September 27, 1960 Today's Date: 12/10/2020   History of Present Illness The pt is a 60 yo male presenting 9/27 after a fall at home due to ongoing pain in his R foot. The pt was found to have R common/external iliac artery occlusion and is now s/p R femoral -popliteal artery bypass with iliofemoral thrombectomy and amputation of R 5th ray on 9/30. PMH includes: DM II, COPD, HLD, PVD, and current tobacco use.   Clinical Impression   Pt admitted for concerns and procedure listed above. PTA pt reported that he was independent with all functional mobility in his home using a RW, and was recently requiring min A for ADL's due to his grip strength. Pt presents with continued defcitis in BUE, and requiring mod A for all transfers and functional mobility. Recommend HHOT once home to assist with increasing his ADL independence and to follow up with his PCP for concerns with BUE. OT will follow acutely.       Recommendations for follow up therapy are one component of a multi-disciplinary discharge planning process, led by the attending physician.  Recommendations may be updated based on patient status, additional functional criteria and insurance authorization.   Follow Up Recommendations  Home health OT;Supervision - Intermittent    Equipment Recommendations  Tub/shower seat    Recommendations for Other Services       Precautions / Restrictions Precautions Precautions: Fall Precaution Comments: wound vac RLE Required Braces or Orthoses: Other Brace Other Brace: darco shoe RLE Restrictions Weight Bearing Restrictions: Yes RLE Weight Bearing: Partial weight bearing      Mobility Bed Mobility Overal bed mobility: Independent             General bed mobility comments: no assist or increased time    Transfers Overall transfer level: Needs assistance Equipment used: Rolling walker (2 wheeled) Transfers:  Sit to/from Stand Sit to Stand: Min assist         General transfer comment: No asssist to power up, min assist to steady as pt had difficulty maintaining upward stance on darco boot.    Balance Overall balance assessment: Needs assistance Sitting-balance support: No upper extremity supported;Feet supported Sitting balance-Leahy Scale: Good     Standing balance support: Bilateral upper extremity supported Standing balance-Leahy Scale: Poor Standing balance comment: frequent LOB , requirin gmin A to steady                           ADL either performed or assessed with clinical judgement   ADL Overall ADL's : Needs assistance/impaired Eating/Feeding: Minimal assistance;Sitting   Grooming: Minimal assistance;Sitting   Upper Body Bathing: Minimal assistance;Sitting   Lower Body Bathing: Moderate assistance;Sitting/lateral leans;Sit to/from stand   Upper Body Dressing : Minimal assistance;Sitting   Lower Body Dressing: Moderate assistance;Sitting/lateral leans;Sit to/from stand   Toilet Transfer: Moderate assistance;Stand-pivot   Toileting- Clothing Manipulation and Hygiene: Moderate assistance;Sitting/lateral lean;Sit to/from stand       Functional mobility during ADLs: Rolling walker;Moderate assistance General ADL Comments: Pt UB ADL's limited by decreased sensation and mobility in both hands, requiring at least min A for completion. LB ADL's limited due to grip weakness and balnce deficits.     Vision Baseline Vision/History: 1 Wears glasses Ability to See in Adequate Light: 0 Adequate Patient Visual Report: No change from baseline Vision Assessment?: No apparent visual deficits     Perception Perception Perception Tested?: No  Praxis Praxis Praxis tested?: Not tested    Pertinent Vitals/Pain Pain Assessment: Faces Faces Pain Scale: Hurts little more Pain Location: RLE Pain Descriptors / Indicators: Aching;Throbbing Pain Intervention(s):  Limited activity within patient's tolerance;Monitored during session;Repositioned     Hand Dominance Right   Extremity/Trunk Assessment Upper Extremity Assessment Upper Extremity Assessment: RUE deficits/detail;LUE deficits/detail RUE Deficits / Details: PT reports increased stiffness and tingling, unable to gage how much force he is putting behind his grip, once gripped he has a hard time opening his hand back up. RUE Sensation: decreased light touch;decreased proprioception RUE Coordination: decreased fine motor LUE Deficits / Details: PT reports increased stiffness and tingling, unable to gage how much force he is putting behind his grip, once gripped he has a hard time opening his hand back up. LUE Sensation: decreased light touch;decreased proprioception LUE Coordination: decreased fine motor   Lower Extremity Assessment Lower Extremity Assessment: Defer to PT evaluation   Cervical / Trunk Assessment Cervical / Trunk Assessment: Normal   Communication Communication Communication: No difficulties   Cognition Arousal/Alertness: Awake/alert Behavior During Therapy: WFL for tasks assessed/performed Overall Cognitive Status: Within Functional Limits for tasks assessed                                     General Comments  VSS on RA    Exercises     Shoulder Instructions      Home Living Family/patient expects to be discharged to:: Private residence Living Arrangements: Alone Available Help at Discharge: Family;Other (Comment) (staying with him for 1 week) Type of Home: House Home Access: Stairs to enter [employer] of Steps: 2 Entrance Stairs-Rails: Right;Left Home Layout: One level     Bathroom Shower/Tub: Teacher, early years/pre: Standard     Home Equipment: Grab bars - toilet;Grab bars - tub/shower;Walker - 2 wheels;Bedside commode;Wheelchair - manual          Prior Functioning/Environment Level of Independence: Needs  assistance  Gait / Transfers Assistance Needed: walks with RW, uses WC for community distance ADL's / Homemaking Assistance Needed: pt able to shower on his own, reports grocceries brought to him, was cooking but now is not   Comments: pt hopeful to return to driving        OT Problem List: Decreased strength;Decreased activity tolerance;Decreased range of motion;Impaired balance (sitting and/or standing);Decreased safety awareness;Decreased knowledge of use of DME or AE;Impaired sensation;Impaired UE functional use      OT Treatment/Interventions: Self-care/ADL training;Therapeutic exercise;Energy conservation;DME and/or AE instruction;Therapeutic activities;Patient/family education;Balance training    OT Goals(Current goals can be found in the care plan section) Acute Rehab OT Goals Patient Stated Goal: to return home, heal foot OT Goal Formulation: With patient Time For Goal Achievement: 12/24/20 Potential to Achieve Goals: Good ADL Goals Pt Will Perform Grooming: with supervision;standing Pt Will Perform Lower Body Bathing: with min guard assist;sitting/lateral leans;sit to/from stand Pt Will Perform Lower Body Dressing: with min guard assist;sitting/lateral leans;sit to/from stand Pt Will Transfer to Toilet: with min guard assist;ambulating Pt Will Perform Toileting - Clothing Manipulation and hygiene: with supervision;sitting/lateral leans;sit to/from stand  OT Frequency: Min 2X/week   Barriers to D/C:            Co-evaluation              AM-PAC OT "6 Clicks" Daily Activity     Outcome Measure Help from another person eating meals?: A  Little Help from another person taking care of personal grooming?: A Little Help from another person toileting, which includes using toliet, bedpan, or urinal?: A Lot Help from another person bathing (including washing, rinsing, drying)?: A Lot Help from another person to put on and taking off regular upper body clothing?: A  Little Help from another person to put on and taking off regular lower body clothing?: A Lot 6 Click Score: 15   End of Session Equipment Utilized During Treatment: Gait belt;Rolling walker;Other (comment) (Darco shoe) Nurse Communication: Mobility status  Activity Tolerance: Patient tolerated treatment well Patient left: in bed;with call bell/phone within reach  OT Visit Diagnosis: Unsteadiness on feet (R26.81);Other abnormalities of gait and mobility (R26.89);Muscle weakness (generalized) (M62.81)                Time: 4451-4604 OT Time Calculation (min): 23 min Charges:  OT General Charges $OT Visit: 1 Visit OT Evaluation $OT Eval Moderate Complexity: 1 Mod OT Treatments $Self Care/Home Management : 8-22 mins  Isay Perleberg H., OTR/L Acute Rehabilitation  Janan Bogie Elane Rayvon Brandvold 12/10/2020, 12:22 PM

## 2020-12-10 NOTE — Plan of Care (Signed)
  Problem: Education: Goal: Knowledge of General Education information will improve Description: Including pain rating scale, medication(s)/side effects and non-pharmacologic comfort measures Outcome: Progressing   Problem: Health Behavior/Discharge Planning: Goal: Ability to manage health-related needs will improve Outcome: Progressing   Problem: Coping: Goal: Level of anxiety will decrease Outcome: Progressing   Problem: Elimination: Goal: Will not experience complications related to bowel motility Outcome: Progressing Goal: Will not experience complications related to urinary retention Outcome: Progressing   Problem: Pain Managment: Goal: General experience of comfort will improve Outcome: Progressing   Problem: Education: Goal: Understanding of CV disease, CV risk reduction, and recovery process will improve Outcome: Progressing Goal: Individualized Educational Video(s) Outcome: Progressing   Problem: Activity: Goal: Ability to return to baseline activity level will improve Outcome: Progressing   Problem: Cardiovascular: Goal: Ability to achieve and maintain adequate cardiovascular perfusion will improve Outcome: Progressing Goal: Vascular access site(s) Level 0-1 will be maintained Outcome: Progressing

## 2020-12-11 ENCOUNTER — Encounter (HOSPITAL_COMMUNITY): Payer: Self-pay | Admitting: Vascular Surgery

## 2020-12-11 ENCOUNTER — Ambulatory Visit: Payer: Medicare HMO | Admitting: Gastroenterology

## 2020-12-11 DIAGNOSIS — I70261 Atherosclerosis of native arteries of extremities with gangrene, right leg: Secondary | ICD-10-CM

## 2020-12-11 DIAGNOSIS — Z95820 Peripheral vascular angioplasty status with implants and grafts: Secondary | ICD-10-CM

## 2020-12-11 DIAGNOSIS — G894 Chronic pain syndrome: Secondary | ICD-10-CM | POA: Diagnosis not present

## 2020-12-11 DIAGNOSIS — F419 Anxiety disorder, unspecified: Secondary | ICD-10-CM | POA: Diagnosis not present

## 2020-12-11 DIAGNOSIS — I70231 Atherosclerosis of native arteries of right leg with ulceration of thigh: Secondary | ICD-10-CM | POA: Diagnosis not present

## 2020-12-11 DIAGNOSIS — Z9889 Other specified postprocedural states: Secondary | ICD-10-CM

## 2020-12-11 DIAGNOSIS — I745 Embolism and thrombosis of iliac artery: Secondary | ICD-10-CM | POA: Diagnosis not present

## 2020-12-11 LAB — GLUCOSE, CAPILLARY
Glucose-Capillary: 61 mg/dL — ABNORMAL LOW (ref 70–99)
Glucose-Capillary: 153 mg/dL — ABNORMAL HIGH (ref 70–99)
Glucose-Capillary: 67 mg/dL — ABNORMAL LOW (ref 70–99)
Glucose-Capillary: 55 mg/dL — ABNORMAL LOW (ref 70–99)
Glucose-Capillary: 58 mg/dL — ABNORMAL LOW (ref 70–99)
Glucose-Capillary: 75 mg/dL (ref 70–99)
Glucose-Capillary: 175 mg/dL — ABNORMAL HIGH (ref 70–99)
Glucose-Capillary: 124 mg/dL — ABNORMAL HIGH (ref 70–99)
Glucose-Capillary: 75 mg/dL (ref 70–99)

## 2020-12-11 MED ORDER — METFORMIN HCL 500 MG PO TABS
1000.0000 mg | ORAL_TABLET | Freq: Two times a day (BID) | ORAL | Status: DC
  Administered 2020-12-11 – 2020-12-12 (×2): 1000 mg via ORAL
  Filled 2020-12-11 (×2): qty 2

## 2020-12-11 NOTE — TOC Progression Note (Addendum)
Transition of Care [facility]) - Progression Note    Patient Details  Name: DANNIEL TONES MRN: [id number] Date of Birth: 01-Aug-1960  Transition of Care [facility]) CM/SW Contact  Zenon Mayo, RN Phone Number: 12/11/2020, 10:12 AM  Clinical Narrative:    NCM spoke with patient at bedside, offered choice, he states he does not have a preference for [place], [place], [place].  NCM contacted [facility], he will sign the order form for the wound vac, left on the chart, will fax to TRacy with [employer] was signed.  Olivia Mackie states usually going thru [facility] takes 1 to 2 days.  NCM made referral for [facility] services to [facility] with [place], awaiting to hear back. Per Tommi Rumps he can take referral, soc will begin 24 to 48 hrs post dc.          Expected Discharge Plan and Services           Expected Discharge Date: 12/11/20                                     Social Determinants of Health (SDOH) Interventions    Readmission Risk Interventions No flowsheet data found.

## 2020-12-11 NOTE — Progress Notes (Signed)
Hypoglycemic Event  CBG: 61   Treatment: 8 oz juice/soda  Symptoms: None  Follow-up CBG: [id number] CBG Result:67  Possible Reasons for Event: Inadequate meal intake  Comments/MD notified:n/a  Patient given ice cream and apple juice. Will recheck CBG.  Delle Reining

## 2020-12-11 NOTE — Progress Notes (Signed)
PROGRESS NOTE  HARVEST STANCO NWG:[id number] DOB: [birth year]   PCP: [name], MD  Patient is from: Home.  Lives alone.  Uses rolling walker at baseline.  DOA: 12/05/2020 LOS: 5  Chief complaints:  Chief Complaint  Patient presents with   Fall   Wound Check    Wound check-toes on right foot     Brief Narrative / Interim history: 60 year old M with PMH of COPD, DM-2 with neuropathy, chronic pain on oxycodone, anxiety, depression, HLD, gout and tobacco use disorder (2 PPD) presenting with increased right foot pain and ulcer, and admitted with critical right limb ischemia due to common iliac artery thrombosis extending into external and internal with distal embolize and multivessel PAD in right lower extremity.  He also had leukocytosis and mild tachycardia.  He was started on IV heparin and IV vancomycin.   Patient underwent right CFA endarterectomy, angioplasty and stenting of right common iliac artery and external iliac artery, right femoropopliteal bypass, right fifth toe ray amputation with VAC placement by Dr. Scot Dock on 12/08/2020.  Change IV vancomycin to Bactrim to treat for possible cellulitis.   Patient will go home with wound VAC and home health on discharge.  Subjective: Seen and examined earlier this morning.  No major events overnight of this morning other than mildly low CBG in the 50s and 60s.  He was not symptomatic from this.  He did not eat his supper last night.  No complaints.  Pain fairly controlled.  Denies chest pain, dyspnea, GI or UTI symptoms.  Objective: Vitals:   12/10/20 2321 12/11/20 0342 12/11/20 0811 12/11/20 1133  BP: 108/76 129/75 119/75 112/64  Pulse: 89 83 (!) 109 78  Resp: 18 11 18 18   Temp: 97.7 F (36.5 C) 97.9 F (36.6 C) 98.1 F (36.7 C) 98.5 F (36.9 C)  TempSrc: Oral Oral Oral Oral  SpO2: 91% 96% 98% 100%  Weight:      Height:        Intake/Output Summary (Last 24 hours) at 12/11/2020 1613 Last data filed at 12/11/2020 1252 Gross  per 24 hour  Intake 725 ml  Output 2735 ml  Net -2010 ml   Filed Weights   12/05/20 2150  Weight: 64 kg    Examination:   GENERAL: No apparent distress.  Nontoxic. HEENT: MMM.  Vision and hearing grossly intact.  NECK: Supple.  No apparent JVD.  RESP: 100% on RA.  No IWOB.  Fair aeration bilaterally. CVS:  RRR. Heart sounds normal.  ABD/GI/GU: BS+. Abd soft, NTND.  MSK/EXT:  Moves extremities. S/p right fifth ray amputation.  Dressing over right foot.  SKIN: Bulky dressing over right foot. NEURO: Awake and alert. Oriented appropriately.  No apparent focal neuro deficit. PSYCH: Calm. Normal affect.   Procedures:  12/08/2020-right CFA endarterectomy, angioplasty and stenting of right common iliac artery and external iliac artery, right femoropopliteal bypass, right fifth toe ray amputation with VAC placement by Dr. Scot Dock  Microbiology summarized: COVID-19 and influenza PCR nonreactive.  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  -Continue inhalers and as needed  nebulizers -Encourage smoking cessation  Controlled DM-2 with hypo and hyperglycemia: A1c 5.5%.  On metformin at home. Recent Labs  Lab 12/11/20 0714 12/11/20 1127 12/11/20 1129 12/11/20 1246 12/11/20 1452  GLUCAP 153* 55* 58* 75 175*  -Discontinue basal and short acting insulin. -Resume home metformin. -Monitor CBG until stable -Continue statin.  Iron deficiency anemia: Slight drop in Hgb likely dilutional.  Iron  saturation 8%.  Ferritin 21.  TIBC 217 Vitamin B12 deficiency: B12 141. Recent Labs    12/05/20 2321 12/07/20 0149 12/07/20 2359 12/08/20 1312 12/09/20 0325 12/10/20 0433  HGB 11.4* 10.9* 9.9* 10.9* 9.6* 10.2*  -Vitamin B12 injection 1000 mcg x 1, then p.o. vitamin B12 -Monitor  Hypokalemia: Resolved.  Chronic pain syndrome -Pain management as above  GERD -Continue Protonix  Leukocytosis: Suspect this to be demargination versus infection.  Improved. -Continue monitoring  Tobacco use disorder: About 2 packs a day. -Encouraged cessation -Nicotine patch -Already on Wellbutrin  Anxiety/depression/insomnia: Stable. -Continue home meds -Added Flexeril at night  Moderate malnutrition/hypoalbuminemia: As evidenced by significant muscle mass and subcu fat loss and hypoalbuminemia and low prealbumin Body mass index is 21.44 kg/m. Nutrition Problem: Increased nutrient needs Etiology: wound healing, post-op healing Signs/Symptoms: estimated needs Interventions: Ensure Enlive (each supplement provides 350kcal and 20 grams of protein), MVI   DVT prophylaxis:  heparin injection 5,000 Units Start: 12/09/20 0600 SCD's Start: 12/08/20 1547   Code Status: Full code Family Communication: Patient and/or RN. Available if any question.  Level of care: Progressive.  Change level of care to MedSurg. Status is: Inpatient  Remains inpatient appropriate because:Inpatient level of care appropriate due to severity of illness and needs wound vac for home  Dispo: The patient is from: Home              Anticipated d/c is to: Home              Patient currently is not medically stable to d/c.   Difficult to place patient No            Consultants:  Vascular surgery   Sch Meds:  Scheduled Meds:  allopurinol  100 mg Oral q AM   aspirin EC  81 mg Oral Q0600   buPROPion  150 mg Oral Q0600   Chlorhexidine Gluconate Cloth  6 each Topical Q0600   clopidogrel  75 mg Oral Q0600    cyclobenzaprine  5 mg Oral QHS   docusate sodium  100 mg Oral Daily   feeding supplement  237 mL Oral BID BM   gabapentin  300 mg Oral TID   heparin  5,000 Units Subcutaneous Q8H   magnesium oxide  400 mg Oral Daily   metFORMIN  1,000 mg Oral BID WC   metoprolol tartrate  12.5 mg Oral BID   multivitamin with minerals  1 tablet Oral Daily   mupirocin ointment  1 application Nasal BID   nicotine  21 mg Transdermal Daily   pantoprazole  80 mg Oral Daily   rosuvastatin  20 mg Oral QHS   sodium chloride flush  3 mL Intravenous Q12H   sulfamethoxazole-trimethoprim  1 tablet Oral Q12H   tamsulosin  0.4 mg Oral q AM   vitamin B-12  1,000 mcg Oral Daily   Continuous Infusions:  sodium chloride     sodium chloride     PRN Meds:.sodium chloride, sodium chloride, acetaminophen **OR** acetaminophen, albuterol, calcium carbonate, clonazePAM, guaiFENesin-dextromethorphan, hydrALAZINE, HYDROmorphone (DILAUDID) injection, labetalol, metoprolol tartrate, ondansetron **OR** ondansetron (ZOFRAN) IV, oxyCODONE-acetaminophen,  phenol, senna-docusate, sodium chloride flush  Antimicrobials: Anti-infectives (From admission, onward)    Start     Dose/Rate Route Frequency Ordered Stop   12/09/20 2200  sulfamethoxazole-trimethoprim (BACTRIM DS) 800-160 MG per tablet 1 tablet        1 tablet Oral Every 12 hours 12/09/20 1534     12/06/20 1300  vancomycin (VANCOCIN) IVPB 1000 mg/200 mL premix  Status:  Discontinued        1,000 mg 200 mL/hr over 60 Minutes Intravenous Every 12 hours 12/06/20 0137 12/09/20 1534   12/06/20 0100  vancomycin (VANCOREADY) IVPB 1250 mg/250 mL        1,250 mg 166.7 mL/hr over 90 Minutes Intravenous  Once 12/06/20 0048 12/06/20 0358        I have personally reviewed the following labs and images: CBC: Recent Labs  Lab 12/05/20 2321 12/07/20 0149 12/07/20 2359 12/08/20 1312 12/09/20 0325 12/10/20 0433  WBC 17.7* 15.6* 11.5*  --  14.1* 13.0*  NEUTROABS 12.8*  --   --   --    --   --   HGB 11.4* 10.9* 9.9* 10.9* 9.6* 10.2*  HCT 35.0* 35.1* 31.1* 32.0* 30.1* 31.9*  MCV 84.1 84.6 83.8  --  83.1 83.5  PLT 428* 416* 374  --  388 414*   BMP &GFR Recent Labs  Lab 12/05/20 2321 12/07/20 0149 12/07/20 2359 12/08/20 1312 12/09/20 0325 12/10/20 0641  NA 132* 135 135 141 134* 136  K 3.9 4.2 3.4* 3.6 4.0 3.9  CL 98 101 100  --  103 102  CO2 29 27 28   --  25 25  GLUCOSE 104* 72 117*  --  165* 79  BUN 7 5* 7  --  7 7  CREATININE 0.43* 0.83 0.85  --  0.71 0.70  CALCIUM 7.7* 7.9* 7.5*  --  7.7* 7.7*  MG  --   --  1.9  --  1.8 1.8  PHOS  --   --  4.0  --  3.8 4.0   Estimated Creatinine Clearance: 90 mL/min (by C-G formula based on SCr of 0.7 mg/dL). Liver & Pancreas: Recent Labs  Lab 12/05/20 2321 12/07/20 0149 12/07/20 2359 12/09/20 0325 12/10/20 0641  AST 12* 13*  --   --   --   ALT 15 13  --   --   --   ALKPHOS 146* 136*  --   --   --   BILITOT 0.2* 0.1*  --   --   --   PROT 5.4* 4.8*  --   --   --   ALBUMIN 1.9* 1.6* <1.5* <1.5* <1.5*   No results for input(s): LIPASE, AMYLASE in the last 168 hours. No results for input(s): AMMONIA in the last 168 hours. Diabetic: No results for input(s): HGBA1C in the last 72 hours.  Recent Labs  Lab 12/11/20 0714 12/11/20 1127 12/11/20 1129 12/11/20 1246 12/11/20 1452  GLUCAP 153* 55* 58* 75 175*   Cardiac Enzymes: No results for input(s): CKTOTAL, CKMB, CKMBINDEX, TROPONINI in the last 168 hours. No results for input(s): PROBNP in the last 8760 hours. Coagulation Profile: No results for input(s): INR, PROTIME in the last 168 hours. Thyroid Function Tests: No results for input(s): TSH, T4TOTAL, FREET4, T3FREE, THYROIDAB in the last 72 hours. Lipid Profile: Recent Labs    12/09/20 0335  CHOL 83  HDL 35*  LDLCALC 31  TRIG 85  CHOLHDL 2.4   Anemia Panel: Recent Labs    12/09/20 0325  12/09/20 0335  VITAMINB12 141*  --   FOLATE  --  6.6  FERRITIN 21*  --   TIBC 217*  --   IRON 18*  --    RETICCTPCT 1.7  --    Urine analysis:    Component Value Date/Time   COLORURINE YELLOW 11/11/2009 1455   APPEARANCEUR CLEAR 11/11/2009 1455   LABSPEC <1.005 (L) 11/11/2009 1455   PHURINE 6.5 11/11/2009 1455   GLUCOSEU NEGATIVE 11/11/2009 1455   HGBUR NEGATIVE 11/11/2009 1455   BILIRUBINUR NEGATIVE 11/11/2009 1455   KETONESUR NEGATIVE 11/11/2009 1455   PROTEINUR NEGATIVE 11/11/2009 1455   UROBILINOGEN 0.2 11/11/2009 1455   NITRITE NEGATIVE 11/11/2009 1455   LEUKOCYTESUR  11/11/2009 1455    NEGATIVE MICROSCOPIC NOT DONE ON URINES WITH NEGATIVE PROTEIN, BLOOD, LEUKOCYTES, NITRITE, OR GLUCOSE <1000 mg/dL.   Sepsis Labs: Invalid input(s): PROCALCITONIN, [place]  Microbiology: Recent Results (from the past 240 hour(s))  Resp Panel by RT-PCR (Flu A&B, Covid) Nasopharyngeal Swab     Status: None   Collection Time: 12/06/20  4:32 AM   Specimen: Nasopharyngeal Swab; Nasopharyngeal(NP) swabs in vial transport medium  Result Value Ref Range Status   SARS Coronavirus 2 by RT PCR NEGATIVE NEGATIVE Final    Comment: (NOTE) SARS-CoV-2 target nucleic acids are NOT DETECTED.  The SARS-CoV-2 RNA is generally detectable in upper respiratory specimens during the acute phase of infection. The lowest concentration of SARS-CoV-2 viral copies this assay can detect is 138 copies/mL. A negative result does not preclude SARS-Cov-2 infection and should not be used as the sole basis for treatment or other patient management decisions. A negative result may occur with  improper specimen collection/handling, submission of specimen other than nasopharyngeal swab, presence of viral mutation(s) within the areas targeted by this assay, and inadequate number of viral copies(<138 copies/mL). A negative result must be combined with clinical observations, patient history, and epidemiological information. The expected result is Negative.  Fact Sheet for Patients:   EntrepreneurPulse.com.au  Fact Sheet for Healthcare Providers:  IncredibleEmployment.be  This test is no t yet approved or cleared by the [place] FDA and  has been authorized for detection and/or diagnosis of SARS-CoV-2 by FDA under an Emergency Use Authorization (EUA). This EUA will remain  in effect (meaning this test can be used) for the duration of the COVID-19 declaration under Section 564(b)(1) of the Act, 21 U.S.C.section 360bbb-3(b)(1), unless the authorization is terminated  or revoked sooner.       Influenza A by PCR NEGATIVE NEGATIVE Final   Influenza B by PCR NEGATIVE NEGATIVE Final    Comment: (NOTE) The Xpert Xpress SARS-CoV-2/FLU/RSV plus assay is intended as an aid in the diagnosis of influenza from Nasopharyngeal swab specimens and should not be used as a sole basis for treatment. Nasal washings and aspirates are unacceptable for Xpert Xpress SARS-CoV-2/FLU/RSV testing.  Fact Sheet for Patients: EntrepreneurPulse.com.au  Fact Sheet for Healthcare Providers: IncredibleEmployment.be  This test is not yet approved or cleared by the [place] FDA and has been authorized for detection and/or diagnosis of SARS-CoV-2 by FDA under an Emergency Use Authorization (EUA). This EUA will remain in effect (meaning this test can be used) for the duration of the COVID-19 declaration under Section 564(b)(1) of the Act, 21 U.S.C. section 360bbb-3(b)(1), unless the authorization is terminated or revoked.  Performed at [facility], [address] [place], [place] [address]   MRSA Next Gen by PCR, Nasal     Status: Abnormal   Collection Time: 12/08/20  5:24 AM   Specimen: Nasal Mucosa; Nasal Swab  Result Value Ref Range Status   MRSA by PCR Next Gen DETECTED (A) NOT DETECTED Final    Comment: RESULT CALLED TO, READ BACK BY AND VERIFIED WITH: H WIESNER,RN@0717  12/08/20 [place] (NOTE) The GeneXpert  MRSA Assay (FDA approved for NASAL specimens only), is one component of a comprehensive MRSA colonization surveillance program. It is not intended to diagnose MRSA infection nor to guide or monitor treatment for MRSA infections. Test performance is not FDA approved in patients less than 68 years old. Performed at [facility], [place] [address] [place],  [address]     Radiology Studies: No results found.    Hoke Baer T. [place]  If 7PM-7AM, please contact night-coverage [email or web address] 12/11/2020, 4:13 PM

## 2020-12-11 NOTE — Consult Note (Signed)
[facility] Nurse Consult Note: Reason for Consult:Vascular has assessed wound and removed VAC dressing from right foot.  Device has been removed from the room/canister thrown away. . I inquired from bedside RN what plan of treatment was regarding discharge home and NPWT.  She said discharge planner is seeking clarification from vascular team regarding [employer] vs Prevena.  My understanding is that patient is discharging home with NPWT in place.  Nurse is unsure.  She states she is able to apply NPWT dressing once orders are clarified. I left dressing kit in room.  I can be paged if needed at number below.    Wound type:ray amputation right fifth toe  Pressure Injury POA: NA Measurement: not assessed  vascular has removed VAC and placed NS moist gauze, kerlix and ace wrap to foot.  Wound bed:not assessed Drainage (amount, consistency, odor) none noted Periwound:not assessed  Dressing procedure/placement/frequency: Bedside RN to clarify discharge plans with MD/discharge planner and apply NPWT as ordered. Kit left in patient room.  Will not follow at this time.  Please re-consult if needed.  Domenic Moras MSN, RN, FNP-BC CWON Wound, Ostomy, Continence Nurse Pager [phone]

## 2020-12-11 NOTE — Progress Notes (Addendum)
[facility]  VASCULAR SURGERY ASSESSMENT & PLAN:   POD 3 RIGHT ILIAC PTA/STENT - RIGHT FEMPOP - OPEN 5TH TOE AMP: Normal right femoral pulse with brisk Doppler signals in the right foot.  The toe amputation site looks good as documented in the photograph below.  VAC change 3 times a week.  VASCULAR QUALITY INITIATIVE: The patient is on aspirin, Plavix, and a statin.  DVT PROPHYLAXIS: He is getting subcu heparin.  DISPOSITION: Okay for discharge from our standpoint once his VAC is approved.  He is ambulating with his Darco shoe.  Gae Gallop, MD 11:56 AM   Subjective  - Doing a little better each day   Objective 129/75 83 97.9 F (36.6 C) (Oral) 11 96%  Intake/Output Summary (Last 24 hours) at 12/11/2020 0703 Last data filed at 12/10/2020 2300 Gross per 24 hour  Intake 725 ml  Output 1785 ml  Net -1060 ml    Right LE toe amputation wound vac in place to suction    Brisk doppler Dp/PT right LE Lungs non labored breathing   Assessment/Planning: POD # 3 Right common femoral artery endarterectomy with vein patch angioplasty Harvesting of left great saphenous vein Angioplasty and stenting of right common iliac artery and external iliac artery with 8 mm x 79 mm VBX stent Right femoral to below-knee popliteal artery bypass with 6 mm ringed PTFE graft Ray amputation of the right fifth toe Placement of VAC  Good inflow brisk doppler signals. Will have wound care RN to change vac today Encouraged mobility, Darco shoe in room. Will arrange F/U with [facility]  Roxy Horseman 12/11/2020 7:03 AM --  Laboratory Lab Results: Recent Labs    12/09/20 0325 12/10/20 0433  WBC 14.1* 13.0*  HGB 9.6* 10.2*  HCT 30.1* 31.9*  PLT 388 414*   BMET Recent Labs    12/09/20 0325 12/10/20 0641  NA 134* 136  K 4.0 3.9  CL 103 102  CO2 25 25  GLUCOSE 165* 79  BUN 7 7  CREATININE 0.71 0.70  CALCIUM 7.7* 7.7*    COAG Lab Results   Component Value Date   INR 1.04 01/22/2009   INR 0.95 01/22/2009   No results found for: PTT

## 2020-12-11 NOTE — TOC Progression Note (Addendum)
Transition of Care [facility]) - Progression Note    Patient Details  Name: Raymond [name] MRN: [id number] Date of Birth: [birth year]  Transition of Care [facility]) CM/SW Contact  Zenon Mayo, RN Phone Number: 12/11/2020, 10:30 AM  Clinical Narrative:    NCM spoke with patient at bedside, offered choice, he states he does not have a preference for [place], [place], [place].  NCM contacted [facility], he will sign the order form for the wound vac, left on the chart, will fax to TRacy with [employer] was signed.  Olivia Mackie states usually going thru [facility] takes 1 to 2 days.  NCM made referral for [facility] services to [facility] with [place], awaiting to hear back. Per Tommi Rumps he can take referral, soc will begin 24 to 48 hrs post dc.       Expected Discharge Plan: [place] Barriers to Discharge: Equipment Delay  Expected Discharge Plan and Services Expected Discharge Plan: [place]   Discharge Planning Services: CM Consult Post Acute Care Choice: Durable Medical Equipment, Home Health Living arrangements for the past 2 months: Single Family Home Expected Discharge Date: 12/11/20               DME Arranged: Harlin Heys DME Agency: [employer] Date DME Agency Contacted: 12/11/20 Time DME Agency Contacted: 51 Representative spoke with at DME Agency: Olivia Mackie HH Arranged: RN, PT, OT [facility] Agency: [place] Date [place]: 12/11/20 Time [place]: 36 Representative spoke with at [place]: [place] Interventions    Readmission Risk Interventions No flowsheet data found.

## 2020-12-11 NOTE — Progress Notes (Signed)
Physical Therapy Treatment Patient Details Name: Raymond [name] MRN: [id number] DOB: 04-08-1960 Today's Date: 12/11/2020   History of Present Illness Pt is a 60 y.o. male admitted 12/05/20 after fall at home due to ongoing pain in his R foot. 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 PMH includes DM II, COPD, HLD, PVD, tobacco use.   PT Comments    Pt progressing with mobility. Today's session focused on transfer and gait training with RW, pt requiring intermittent min-modA to prevent LOB with ambulation. Increase. Pt preparing for d/c home today, increased time discussing safety recommendations and fall risk reduction, gait belt provided; pt reports no further questions or concerns. If to remain admitted, will continue to follow acutely.    Recommendations for follow up therapy are one component of a multi-disciplinary discharge planning process, led by the attending physician.  Recommendations may be updated based on patient status, additional functional criteria and insurance authorization.  Follow Up Recommendations  Home health PT;Supervision for mobility/OOB     Equipment Recommendations  None recommended by PT    Recommendations for Other Services       Precautions / Restrictions Precautions Precautions: Fall Required Braces or Orthoses: Other Brace Other Brace: darco shoe RLE Restrictions Weight Bearing Restrictions: Yes RLE Weight Bearing: Partial weight bearing RLE Partial Weight Bearing Percentage or Pounds: WB through heel in darco shoe     Mobility  Bed Mobility Overal bed mobility: Modified Independent             General bed mobility comments: HOB elevated, increased time    Transfers Overall transfer level: Needs assistance Equipment used: Rolling walker (2 wheeled) Transfers: Sit to/from Stand Sit to Stand: Min assist          General transfer comment: Increased time and effort to power into standing, reliant on momentum and minA for trunk elevation, assist to maintain stability upon standing  Ambulation/Gait Ambulation/Gait assistance: Min assist;Mod assist Gait Distance (Feet): 24 Feet Assistive device: Rolling walker (2 wheeled) Gait Pattern/deviations: Step-to pattern;Decreased weight shift to right;Antalgic;Trunk flexed;Leaning posteriorly Gait velocity: decreased   General Gait Details: Slow, unsteady gait with RW; pt with difficulty keeping balance while wearing darco shoe, frequent LOB backwards requiring min-modA to correct   Stairs Stairs:  (pt declines; reports family will push him up w/c in ramp)           Wheelchair Mobility    Modified Rankin (Stroke Patients Only)       Balance Overall balance assessment: Needs assistance Sitting-balance support: No upper extremity supported;Feet supported Sitting balance-Leahy Scale: Good Sitting balance - Comments: able to reach R foot to don/doff darco, requiring assist for straps   Standing balance support: Bilateral upper extremity supported;During functional activity Standing balance-Leahy Scale: Poor Standing balance comment: Reliant on UE support and external assist                            Cognition Arousal/Alertness: Awake/alert Behavior During Therapy: WFL for tasks assessed/performed;Flat affect Overall Cognitive Status: Within Functional Limits for tasks assessed                                        Exercises      General Comments General comments (skin integrity, edema, etc.): Increased  time discussing fall risk reduction for return home - pt reports will have family push him in w/c into home (has ramp), daughter will be present when pt gets up to walk short distance to bathroom; gait belt provided and encouraged pt to have daughter use this for safety at home; reviewed educ re: precautions,  darco shoe wear, edema control, DVT prevention      Pertinent Vitals/Pain Pain Assessment: Faces Faces Pain Scale: Hurts a little bit Pain Location: RLE Pain Descriptors / Indicators: Sore Pain Intervention(s): Monitored during session    Home Living                      Prior Function            PT Goals (current goals can now be found in the care plan section) Progress towards PT goals: Progressing toward goals    Frequency    Min 3X/week      PT Plan Current plan remains appropriate    Co-evaluation              AM-PAC PT "6 Clicks" Mobility   Outcome Measure  Help needed turning from your back to your side while in a flat bed without using bedrails?: None Help needed moving from lying on your back to sitting on the side of a flat bed without using bedrails?: A Little Help needed moving to and from a bed to a chair (including a wheelchair)?: A Little Help needed standing up from a chair using your arms (e.g., wheelchair or bedside chair)?: A Little Help needed to walk in hospital room?: A Lot Help needed climbing 3-5 steps with a railing? : A Lot 6 Click Score: 17    End of Session Equipment Utilized During Treatment: Gait belt Activity Tolerance: Patient tolerated treatment well Patient left: in bed;with call bell/phone within reach;with bed alarm set Nurse Communication: Mobility status PT Visit Diagnosis: Other abnormalities of gait and mobility (R26.89);Pain Pain - Right/Left: Right Pain - part of body: Ankle and joints of foot     Time: 4287-6811 PT Time Calculation (min) (ACUTE ONLY): 16 min  Charges:  $Therapeutic Activity: 8-22 mins                    Raymond [name], PT, DPT Acute Rehabilitation Services  Pager [phone] Office [phone]  Derry Lory 12/11/2020, 12:16 PM

## 2020-12-11 NOTE — Care Management Important Message (Signed)
Important Message  Patient Details  Name: Raymond [name] MRN: [id number] Date of Birth: [birth year]   Medicare Important Message Given:  Yes     Shelda Altes 12/11/2020, 11:31 AM

## 2020-12-11 NOTE — Progress Notes (Signed)
Inpatient Diabetes Program Recommendations  AACE/ADA: New Consensus Statement on Inpatient Glycemic Control (2015)  Target Ranges:  Prepandial:   less than 140 mg/dL      Peak postprandial:   less than 180 mg/dL (1-2 hours)      Critically ill patients:  140 - 180 mg/dL   Lab Results  Component Value Date   GLUCAP 153 (H) 12/11/2020   HGBA1C 5.5 12/06/2020    Review of Glycemic Control Results for RAI, SEVERNS (MRN [id number]) as of 12/11/2020 10:28  Ref. Range 12/10/2020 20:53 12/11/2020 06:13 12/11/2020 06:55 12/11/2020 07:14  Glucose-Capillary Latest Ref Range: 70 - 99 mg/dL 110 (H) 61 (L) 67 (L) 153 (H)   Diabetes history: Type 2 DM Outpatient Diabetes medications: Metformin 1000 mg BID Current orders for Inpatient glycemic control: Semglee 10 units QD, Novolog 0-9 units TID  Inpatient Diabetes Program Recommendations:    Noted hypoglycemia this AM of 61 mg/dL. Question need for Semglee. Consider discontinuing Semglee.   Thanks, Bronson Curb, MSN, RNC-OB Diabetes Coordinator [phone] (8a-5p)

## 2020-12-12 LAB — CORTISOL-AM, BLOOD: Cortisol - AM: 21 ug/dL (ref 6.7–22.6)

## 2020-12-12 LAB — CBC
HCT: 29.3 % — ABNORMAL LOW (ref 39.0–52.0)
Hemoglobin: 9.4 g/dL — ABNORMAL LOW (ref 13.0–17.0)
MCH: 26.8 pg (ref 26.0–34.0)
MCHC: 32.1 g/dL (ref 30.0–36.0)
MCV: 83.5 fL (ref 80.0–100.0)
Platelets: 474 10*3/uL — ABNORMAL HIGH (ref 150–400)
RBC: 3.51 MIL/uL — ABNORMAL LOW (ref 4.22–5.81)
RDW: 16.6 % — ABNORMAL HIGH (ref 11.5–15.5)
WBC: 13.2 10*3/uL — ABNORMAL HIGH (ref 4.0–10.5)
nRBC: 0 % (ref 0.0–0.2)

## 2020-12-12 LAB — RENAL FUNCTION PANEL
Albumin: <1.5 g/dL — ABNORMAL LOW (ref 3.5–5.0)
Anion gap: 6 (ref 5–15)
BUN: 10 mg/dL (ref 6–20)
CO2: 28 mmol/L (ref 22–32)
Calcium: 7.8 mg/dL — ABNORMAL LOW (ref 8.9–10.3)
Chloride: 102 mmol/L (ref 98–111)
Creatinine, Ser: 0.91 mg/dL (ref 0.61–1.24)
GFR, Estimated: >60 mL/min (ref >60)
Glucose, Bld: 67 mg/dL — ABNORMAL LOW (ref 70–99)
Phosphorus: 4.3 mg/dL (ref 2.5–4.6)
Potassium: 4.1 mmol/L (ref 3.5–5.1)
Sodium: 136 mmol/L (ref 135–145)

## 2020-12-12 LAB — GLUCOSE, CAPILLARY
Glucose-Capillary: 60 mg/dL — ABNORMAL LOW (ref 70–99)
Glucose-Capillary: 97 mg/dL (ref 70–99)
Glucose-Capillary: 86 mg/dL (ref 70–99)
Glucose-Capillary: 121 mg/dL — ABNORMAL HIGH (ref 70–99)
Glucose-Capillary: 64 mg/dL — ABNORMAL LOW (ref 70–99)

## 2020-12-12 LAB — MAGNESIUM: Magnesium: 2 mg/dL (ref 1.7–2.4)

## 2020-12-12 NOTE — Progress Notes (Signed)
Patient CBG were D.C. yesterday. We cont. To check them due to they had been running  low. They had cont. To run low with patient eating and drinking all night . Patient voiced no complaints. He has had graham crackers and choc. Chip cookies and drinking Mt. Dew all night. Gave O.J. this a.m.  Breakfast on its way.

## 2020-12-12 NOTE — Progress Notes (Signed)
Patient's AM blood glucose 60. Patient stated to be feeling weak and did not want his breakfast. RN encouraged patient to eat and notified Dr. Cyndia Skeeters regarding blood glucose and re ordering CBG checks. Will continue to monitor.

## 2020-12-12 NOTE — Progress Notes (Signed)
Physical Therapy Treatment Patient Details Name: Raymond [name] MRN: [id number] DOB: [birth year] Today's Date: 12/12/2020   History of Present Illness Pt is a 60 y.o. male admitted 12/05/20 after fall at home due to ongoing pain in his R foot. 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 PMH includes DM II, COPD, HLD, PVD, tobacco use.    PT Comments    Pt received in bed, agreeable to participation in therapy. He required min assist sit to stand, min/mod assist ambulation 25' with RW and min assist sit to supine. LOB posteriorly noted during gait. Pt returned to supine in bed at end of session.    Recommendations for follow up therapy are one component of a multi-disciplinary discharge planning process, led by the attending physician.  Recommendations may be updated based on patient status, additional functional criteria and insurance authorization.  Follow Up Recommendations  Home health PT;Supervision for mobility/OOB     Equipment Recommendations  None recommended by PT    Recommendations for Other Services       Precautions / Restrictions Precautions Precautions: Fall;Other (comment) Precaution Comments: R foot wound vac Required Braces or Orthoses: Other Brace Other Brace: darco shoe RLE Restrictions RLE Weight Bearing: Partial weight bearing RLE Partial Weight Bearing Percentage or Pounds: WB through heel in darco shoe     Mobility  Bed Mobility Overal bed mobility: Needs Assistance Bed Mobility: Supine to Sit;Sit to Supine     Supine to sit: Modified independent (Device/Increase time);HOB elevated Sit to supine: Min assist;HOB elevated   General bed mobility comments: HOB elevated, increased time, + rail, assist with RLE back to bed    Transfers Overall transfer level: Needs assistance Equipment used: Rolling walker (2 wheeled) Transfers: Sit  to/from Stand Sit to Stand: Min assist         General transfer comment: assist to power up, increased time to stabilize balance  Ambulation/Gait Ambulation/Gait assistance: Min assist;Mod assist Gait Distance (Feet): 25 Feet Assistive device: Rolling walker (2 wheeled) Gait Pattern/deviations: Step-to pattern;Decreased stride length;Antalgic;Decreased weight shift to right Gait velocity: decreased Gait velocity interpretation: <1.31 ft/sec, indicative of household ambulator General Gait Details: slow, guarded gait. Multi LOB posteriorly requiring mod assist to correct   Stairs             Wheelchair Mobility    Modified Rankin (Stroke Patients Only)       Balance Overall balance assessment: Needs assistance Sitting-balance support: No upper extremity supported;Feet supported Sitting balance-Leahy Scale: Good     Standing balance support: Bilateral upper extremity supported;During functional activity Standing balance-Leahy Scale: Poor Standing balance comment: reliant on external support                            Cognition Arousal/Alertness: Awake/alert Behavior During Therapy: WFL for tasks assessed/performed Overall Cognitive Status: Within Functional Limits for tasks assessed                                        Exercises      General Comments General comments (skin integrity, edema, etc.): Pt with low blood sugar this AM. Reports feeling weak and sluggish.      Pertinent Vitals/Pain Pain Assessment: Faces Faces Pain Scale: Hurts a little bit Pain Location: R foot Pain  Descriptors / Indicators: Throbbing Pain Intervention(s): Monitored during session;Repositioned    Home Living                      Prior Function            PT Goals (current goals can now be found in the care plan section) Acute Rehab PT Goals Patient Stated Goal: home Progress towards PT goals: Progressing toward goals     Frequency    Min 3X/week      PT Plan Current plan remains appropriate    Co-evaluation              AM-PAC PT "6 Clicks" Mobility   Outcome Measure  Help needed turning from your back to your side while in a flat bed without using bedrails?: None Help needed moving from lying on your back to sitting on the side of a flat bed without using bedrails?: A Little Help needed moving to and from a bed to a chair (including a wheelchair)?: A Little Help needed standing up from a chair using your arms (e.g., wheelchair or bedside chair)?: A Little Help needed to walk in hospital room?: A Lot Help needed climbing 3-5 steps with a railing? : A Lot 6 Click Score: 17    End of Session Equipment Utilized During Treatment: Gait belt Activity Tolerance: Patient tolerated treatment well Patient left: in bed;with call bell/phone within reach;with bed alarm set Nurse Communication: Mobility status PT Visit Diagnosis: Other abnormalities of gait and mobility (R26.89);Pain Pain - Right/Left: Right Pain - part of body: Ankle and joints of foot     Time: 9470-9628 PT Time Calculation (min) (ACUTE ONLY): 24 min  Charges:  $Gait Training: 23-37 mins                     Lorrin Goodell, [place]  Office # [phone] Pager [phone]    Lorriane Shire 12/12/2020, 10:53 AM

## 2020-12-12 NOTE — Progress Notes (Signed)
PROGRESS NOTE  CARLETON VANVALKENBURGH PTW:[id number] DOB: 1960-07-24   PCP: [name], MD  Patient is from: Home.  Lives alone.  Uses rolling walker at baseline.  DOA: 12/05/2020 LOS: 6  Chief complaints:  Chief Complaint  Patient presents with   Fall   Wound Check    Wound check-toes on right foot     Brief Narrative / Interim history: 60 year old M with PMH of COPD, DM-2 with neuropathy, chronic pain on oxycodone, anxiety, depression, HLD, gout and tobacco use disorder (2 PPD) presenting with increased right foot pain and ulcer, and admitted with critical right limb ischemia due to common iliac artery thrombosis extending into external and internal with distal embolize and multivessel PAD in right lower extremity.  He also had leukocytosis and mild tachycardia.  He was started on IV heparin and IV vancomycin.   Patient underwent right CFA endarterectomy, angioplasty and stenting of right common iliac artery and external iliac artery, right femoropopliteal bypass, right fifth toe ray amputation with VAC placement by Dr. Scot Dock on 12/08/2020.  Change IV vancomycin to Bactrim to treat for possible cellulitis.   Likely home with home health once DME wound VAC delivered.  Subjective: Seen and examined earlier this morning.  No major events overnight of this morning.  CBG down to 60 earlier this morning.  Not symptomatic.  No longer on insulin.  No complaints.  Denies chest pain, dyspnea, GI or UTI symptoms.  Objective: Vitals:   12/12/20 0712 12/12/20 1000 12/12/20 1213 12/12/20 1607  BP: 108/72  97/62 107/78  Pulse: 77 80 72 74  Resp: 15  18 19   Temp: 97.7 F (36.5 C)  98.1 F (36.7 C) 97.9 F (36.6 C)  TempSrc: Oral  Oral Oral  SpO2: 97%  95% 98%  Weight:      Height:        Intake/Output Summary (Last 24 hours) at 12/12/2020 1651 Last data filed at 12/12/2020 1608 Gross per 24 hour  Intake 1200 ml  Output 2975 ml  Net -1775 ml   Filed Weights   12/05/20 2150  Weight: 64 kg     Examination:  GENERAL: No apparent distress.  Nontoxic. HEENT: MMM.  Vision and hearing grossly intact.  NECK: Supple.  No apparent JVD.  RESP: 98% on RA.  No IWOB.  Fair aeration bilaterally. CVS:  RRR. Heart sounds normal.  ABD/GI/GU: BS+. Abd soft, NTND.  MSK/EXT:  Moves extremities.  Wound VAC to right foot. SKIN: Erythema over the dorsal aspect of right foot improved. NEURO: Awake and alert. Oriented appropriately.  No apparent focal neuro deficit. PSYCH: Calm. Normal affect.   Procedures:  12/08/2020-right CFA endarterectomy, angioplasty and stenting of right common iliac artery and external iliac artery, right femoropopliteal bypass, right fifth toe ray amputation with VAC placement by Dr. Scot Dock  Microbiology summarized: COVID-19 and influenza PCR nonreactive.  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  Chronic COPD: Stable.  -Continue inhalers and as needed nebulizers -Encourage smoking cessation -Nicotine patch on discharge  Controlled  DM-2 with borderline low blood glucose: A1c 5.5%.  On metformin at home. Recent Labs  Lab 12/11/20 1736 12/11/20 2024 12/12/20 0628 12/12/20 1050 12/12/20 1609  GLUCAP 124* 75 60* 97 86  -Continue home metformin. -We have already discontinued insulin -Monitor CBG until stable -Continue statin.  Iron deficiency anemia: Slight drop in Hgb likely dilutional.  Iron saturation 8%.   Ferritin 21.  TIBC 217 Vitamin B12 deficiency: B12 141. Recent Labs    12/05/20 2321 12/07/20 0149 12/07/20 2359 12/08/20 1312 12/09/20 0325 12/10/20 0433 12/12/20 0112  HGB 11.4* 10.9* 9.9* 10.9* 9.6* 10.2* 9.4*  -Vitamin B12 injection 1000 mcg x 1, then p.o. vitamin B12 -Monitor  Hypokalemia: Resolved.  Chronic pain syndrome -Pain management as above  GERD -Continue Protonix  Leukocytosis: Suspect this to be demargination versus infection.  Improved. -Continue monitoring  Tobacco use disorder: About 2 packs a day. -Encouraged cessation -Nicotine patch on discharge -Already on Wellbutrin  Anxiety/depression/insomnia: Stable. -Continue home meds -Added Flexeril at night  Moderate malnutrition/hypoalbuminemia: As evidenced by significant muscle mass and subcu fat loss and hypoalbuminemia and low prealbumin Body mass index is 21.44 kg/m. Nutrition Problem: Increased nutrient needs Etiology: wound healing, post-op healing Signs/Symptoms: estimated needs Interventions: Ensure Enlive (each supplement provides 350kcal and 20 grams of protein), MVI   DVT prophylaxis:  heparin injection 5,000 Units Start: 12/09/20 0600 SCD's Start: 12/08/20 1547   Code Status: Full code Family Communication: Patient and/or RN. Available if any question.  Level of care: Med-Surg.   Status is: Inpatient  Remains inpatient appropriate because:Inpatient level of care appropriate due to severity of illness and needs wound vac for home  Dispo: The patient is from: Home              Anticipated d/c is to: Home              Patient currently is not medically stable to d/c.   Difficult to place patient No            Consultants:  Vascular surgery   Sch Meds:  Scheduled Meds:  allopurinol  100 mg Oral q AM   aspirin EC  81 mg Oral Q0600   buPROPion  150 mg Oral Q0600   clopidogrel  75 mg Oral Q0600   cyclobenzaprine  5 mg Oral QHS   docusate sodium  100 mg Oral Daily    feeding supplement  237 mL Oral BID BM   gabapentin  300 mg Oral TID   heparin  5,000 Units Subcutaneous Q8H   magnesium oxide  400 mg Oral Daily   metFORMIN  1,000 mg Oral BID WC   metoprolol tartrate  12.5 mg Oral BID   multivitamin with minerals  1 tablet Oral Daily   mupirocin ointment  1 application Nasal BID   nicotine  21 mg Transdermal Daily   pantoprazole  80 mg Oral Daily   rosuvastatin  20 mg Oral QHS   sodium chloride flush  3 mL Intravenous Q12H   sulfamethoxazole-trimethoprim  1 tablet Oral Q12H   tamsulosin  0.4 mg Oral q AM   vitamin B-12  1,000 mcg Oral Daily   Continuous Infusions:  sodium chloride     sodium chloride     PRN Meds:.sodium chloride, sodium chloride, acetaminophen **OR** acetaminophen, albuterol, calcium carbonate, clonazePAM, guaiFENesin-dextromethorphan, hydrALAZINE, HYDROmorphone (DILAUDID) injection, labetalol, metoprolol tartrate, ondansetron **OR** ondansetron (ZOFRAN) IV, oxyCODONE-acetaminophen, phenol, senna-docusate, sodium chloride flush  Antimicrobials: Anti-infectives (From admission, onward)    Start  Dose/Rate Route Frequency Ordered Stop   12/09/20 2200  sulfamethoxazole-trimethoprim (BACTRIM DS) 800-160 MG per tablet 1 tablet        1 tablet Oral Every 12 hours 12/09/20 1534     12/06/20 1300  vancomycin (VANCOCIN) IVPB 1000 mg/200 mL premix  Status:  Discontinued        1,000 mg 200 mL/hr over 60 Minutes Intravenous Every 12 hours 12/06/20 0137 12/09/20 1534   12/06/20 0100  vancomycin (VANCOREADY) IVPB 1250 mg/250 mL        1,250 mg 166.7 mL/hr over 90 Minutes Intravenous  Once 12/06/20 0048 12/06/20 0358        I have personally reviewed the following labs and images: CBC: Recent Labs  Lab 12/05/20 2321 12/07/20 0149 12/07/20 2359 12/08/20 1312 12/09/20 0325 12/10/20 0433 12/12/20 0112  WBC 17.7* 15.6* 11.5*  --  14.1* 13.0* 13.2*  NEUTROABS 12.8*  --   --   --   --   --   --   HGB 11.4* 10.9* 9.9* 10.9* 9.6*  10.2* 9.4*  HCT 35.0* 35.1* 31.1* 32.0* 30.1* 31.9* 29.3*  MCV 84.1 84.6 83.8  --  83.1 83.5 83.5  PLT 428* 416* 374  --  388 414* 474*   BMP &GFR Recent Labs  Lab 12/07/20 0149 12/07/20 2359 12/08/20 1312 12/09/20 0325 12/10/20 0641 12/12/20 0112  NA 135 135 141 134* 136 136  K 4.2 3.4* 3.6 4.0 3.9 4.1  CL 101 100  --  103 102 102  CO2 27 28  --  25 25 28   GLUCOSE 72 117*  --  165* 79 67*  BUN 5* 7  --  7 7 10   CREATININE 0.83 0.85  --  0.71 0.70 0.91  CALCIUM 7.9* 7.5*  --  7.7* 7.7* 7.8*  MG  --  1.9  --  1.8 1.8 2.0  PHOS  --  4.0  --  3.8 4.0 4.3   Estimated Creatinine Clearance: 79.1 mL/min (by C-G formula based on SCr of 0.91 mg/dL). Liver & Pancreas: Recent Labs  Lab 12/05/20 2321 12/07/20 0149 12/07/20 2359 12/09/20 0325 12/10/20 0641 12/12/20 0112  AST 12* 13*  --   --   --   --   ALT 15 13  --   --   --   --   ALKPHOS 146* 136*  --   --   --   --   BILITOT 0.2* 0.1*  --   --   --   --   PROT 5.4* 4.8*  --   --   --   --   ALBUMIN 1.9* 1.6* <1.5* <1.5* <1.5* <1.5*   No results for input(s): LIPASE, AMYLASE in the last 168 hours. No results for input(s): AMMONIA in the last 168 hours. Diabetic: No results for input(s): HGBA1C in the last 72 hours.  Recent Labs  Lab 12/11/20 1736 12/11/20 2024 12/12/20 0628 12/12/20 1050 12/12/20 1609  GLUCAP 124* 75 60* 97 86   Cardiac Enzymes: No results for input(s): CKTOTAL, CKMB, CKMBINDEX, TROPONINI in the last 168 hours. No results for input(s): PROBNP in the last 8760 hours. Coagulation Profile: No results for input(s): INR, PROTIME in the last 168 hours. Thyroid Function Tests: No results for input(s): TSH, T4TOTAL, FREET4, T3FREE, THYROIDAB in the last 72 hours. Lipid Profile: No results for input(s): CHOL, HDL, LDLCALC, TRIG, CHOLHDL, LDLDIRECT in the last 72 hours.  Anemia Panel: No results for input(s): VITAMINB12, FOLATE, FERRITIN, TIBC, IRON, RETICCTPCT  in the last 72 hours.  Urine  analysis:    Component Value Date/Time   COLORURINE YELLOW 11/11/2009 1455   APPEARANCEUR CLEAR 11/11/2009 1455   LABSPEC <1.005 (L) 11/11/2009 1455   PHURINE 6.5 11/11/2009 1455   GLUCOSEU NEGATIVE 11/11/2009 1455   HGBUR NEGATIVE 11/11/2009 1455   BILIRUBINUR NEGATIVE 11/11/2009 1455   KETONESUR NEGATIVE 11/11/2009 1455   PROTEINUR NEGATIVE 11/11/2009 1455   UROBILINOGEN 0.2 11/11/2009 1455   NITRITE NEGATIVE 11/11/2009 1455   LEUKOCYTESUR  11/11/2009 1455    NEGATIVE MICROSCOPIC NOT DONE ON URINES WITH NEGATIVE PROTEIN, BLOOD, LEUKOCYTES, NITRITE, OR GLUCOSE <1000 mg/dL.   Sepsis Labs: Invalid input(s): PROCALCITONIN, [place]  Microbiology: Recent Results (from the past 240 hour(s))  Resp Panel by RT-PCR (Flu A&B, Covid) Nasopharyngeal Swab     Status: None   Collection Time: 12/06/20  4:32 AM   Specimen: Nasopharyngeal Swab; Nasopharyngeal(NP) swabs in vial transport medium  Result Value Ref Range Status   SARS Coronavirus 2 by RT PCR NEGATIVE NEGATIVE Final    Comment: (NOTE) SARS-CoV-2 target nucleic acids are NOT DETECTED.  The SARS-CoV-2 RNA is generally detectable in upper respiratory specimens during the acute phase of infection. The lowest concentration of SARS-CoV-2 viral copies this assay can detect is 138 copies/mL. A negative result does not preclude SARS-Cov-2 infection and should not be used as the sole basis for treatment or other patient management decisions. A negative result may occur with  improper specimen collection/handling, submission of specimen other than nasopharyngeal swab, presence of viral mutation(s) within the areas targeted by this assay, and inadequate number of viral copies(<138 copies/mL). A negative result must be combined with clinical observations, patient history, and epidemiological information. The expected result is Negative.  Fact Sheet for Patients:  EntrepreneurPulse.com.au  Fact Sheet for Healthcare  Providers:  IncredibleEmployment.be  This test is no t yet approved or cleared by the [place] FDA and  has been authorized for detection and/or diagnosis of SARS-CoV-2 by FDA under an Emergency Use Authorization (EUA). This EUA will remain  in effect (meaning this test can be used) for the duration of the COVID-19 declaration under Section 564(b)(1) of the Act, 21 U.S.C.section 360bbb-3(b)(1), unless the authorization is terminated  or revoked sooner.       Influenza A by PCR NEGATIVE NEGATIVE Final   Influenza B by PCR NEGATIVE NEGATIVE Final    Comment: (NOTE) The Xpert Xpress SARS-CoV-2/FLU/RSV plus assay is intended as an aid in the diagnosis of influenza from Nasopharyngeal swab specimens and should not be used as a sole basis for treatment. Nasal washings and aspirates are unacceptable for Xpert Xpress SARS-CoV-2/FLU/RSV testing.  Fact Sheet for Patients: EntrepreneurPulse.com.au  Fact Sheet for Healthcare Providers: IncredibleEmployment.be  This test is not yet approved or cleared by the [place] FDA and has been authorized for detection and/or diagnosis of SARS-CoV-2 by FDA under an Emergency Use Authorization (EUA). This EUA will remain in effect (meaning this test can be used) for the duration of the COVID-19 declaration under Section 564(b)(1) of the Act, 21 U.S.C. section 360bbb-3(b)(1), unless the authorization is terminated or revoked.  Performed at [facility], [address] [place], [place] [address]   MRSA Next Gen by PCR, Nasal     Status: Abnormal   Collection Time: 12/08/20  5:24 AM   Specimen: Nasal Mucosa; Nasal Swab  Result Value Ref Range Status   MRSA by PCR Next Gen DETECTED (A) NOT DETECTED Final    Comment: RESULT CALLED  TO, READ BACK BY AND VERIFIED WITH: H WIESNER,RN@0717  12/08/20 [place] (NOTE) The GeneXpert MRSA Assay (FDA approved for NASAL specimens only), is one component of  a comprehensive MRSA colonization surveillance program. It is not intended to diagnose MRSA infection nor to guide or monitor treatment for MRSA infections. Test performance is not FDA approved in patients less than 1 years old. Performed at [facility], [place] [address], [place], [place] [address]     Radiology Studies: No results found.    Kedrick Mcnamee T. [place]  If 7PM-7AM, please contact night-coverage [email or web address] 12/12/2020, 4:51 PM

## 2020-12-12 NOTE — Progress Notes (Addendum)
  Progress Note    12/12/2020 7:44 AM 4 Days Post-Op  Subjective:  No new complaints   Vitals:   12/12/20 0333 12/12/20 0712  BP: 112/69 108/72  Pulse: 73 77  Resp: 10 15  Temp: 98.3 F (36.8 C) 97.7 F (36.5 C)  SpO2: 99% 97%   Physical Exam Lungs:  non labored Incisions:  R groin and popliteal incision c/d/I; vac in place R foot; vein harvest incision L leg healing well Extremities:  palpable R DP pulse Neurologic: A&O  CBC    Component Value Date/Time   WBC 13.2 (H) 12/12/2020 0112   RBC 3.51 (L) 12/12/2020 0112   HGB 9.4 (L) 12/12/2020 0112   HGB 15.6 09/25/2012 1554   HCT 29.3 (L) 12/12/2020 0112   HCT 44 09/25/2012 1554   PLT 474 (H) 12/12/2020 0112   MCV 83.5 12/12/2020 0112   MCV 86.1 09/25/2012 1554   MCH 26.8 12/12/2020 0112   MCHC 32.1 12/12/2020 0112   RDW 16.6 (H) 12/12/2020 0112   LYMPHSABS 3.7 12/05/2020 2321   MONOABS 1.0 12/05/2020 2321   EOSABS 0.0 12/05/2020 2321   BASOSABS 0.1 12/05/2020 2321    BMET    Component Value Date/Time   NA 136 12/12/2020 0112   NA 141 09/25/2012 1554   K 4.1 12/12/2020 0112   K 4.1 09/25/2012 1554   CL 102 12/12/2020 0112   CO2 28 12/12/2020 0112   GLUCOSE 67 (L) 12/12/2020 0112   BUN 10 12/12/2020 0112   BUN 12 09/25/2012 1554   CREATININE 0.91 12/12/2020 0112   CREATININE 1.09 09/25/2012 1554   CALCIUM 7.8 (L) 12/12/2020 0112   CALCIUM 8.9 09/25/2012 1554   GFRNONAA >60 12/12/2020 0112   GFRAA >60 06/24/2017 0515    INR    Component Value Date/Time   INR 1.04 01/22/2009 1727     Intake/Output Summary (Last 24 hours) at 12/12/2020 0744 Last data filed at 12/12/2020 4166 Gross per 24 hour  Intake 1680 ml  Output 4025 ml  Net -2345 ml     Assessment/Plan:  60 y.o. male is s/p R iliac stent; CFA endart and vein patch, and femoral to pop bypass with PTFE 4 Days Post-Op   R foot well perfused with palpable DP pulse Vac change tomorrow OOB; continue to work with therapy and ambulate with  darco shoe Ok for discharge home when [facility] RN and vac approved   Dagoberto Ligas, PA-C [facility] [phone] 12/12/2020 7:44 AM  I have interviewed the patient and examined the patient. I agree with the findings by the PA.  He is waiting for his [facility] equipment so that he can be discharged.  Gae Gallop, MD 5:19 PM

## 2020-12-12 NOTE — Progress Notes (Signed)
Mobility Specialist Progress Note:   12/12/20 1000  Therapy Vitals  Pulse Rate 80  Mobility  Activity Ambulated in room  Level of Assistance Moderate assist, patient does 50-74%  Assistive Device Front wheel walker  Distance Ambulated (ft) 25 ft  Mobility Ambulated with assistance in room  Mobility Response Tolerated well  Mobility performed by Mobility specialist  $Mobility charge 1 Mobility   Pre Mobility: HR 80 bpm During Mobility: HR 83 bpm Post Mobility: HR 80 bpm  Pt was received in bed and after encouragement, agreed to mobility. Required modA to stand from EOB and cues for hand placement. ModA during ambulation d/t multiple posterior LOBs. Voiced no pain and tolerated well. Pt left in bed with all needs met and MD present.   Nelta Numbers Mobility Specialist  Phone [phone]

## 2020-12-13 LAB — GLUCOSE, CAPILLARY
Glucose-Capillary: 69 mg/dL — ABNORMAL LOW (ref 70–99)
Glucose-Capillary: 75 mg/dL (ref 70–99)
Glucose-Capillary: 75 mg/dL (ref 70–99)

## 2020-12-13 MED ORDER — SULFAMETHOXAZOLE-TRIMETHOPRIM 800-160 MG PO TABS: 1.0000 | ORAL_TABLET | Freq: Two times a day (BID) | ORAL | 0 refills | Status: DC

## 2020-12-13 MED ORDER — ROSUVASTATIN CALCIUM 10 MG PO TABS: 20.0000 mg | ORAL_TABLET | Freq: Every day | ORAL | Status: DC

## 2020-12-13 MED ORDER — CLOPIDOGREL BISULFATE 75 MG PO TABS: 75.0000 mg | ORAL_TABLET | Freq: Every day | ORAL | 2 refills | Status: DC

## 2020-12-13 NOTE — TOC Transition Note (Signed)
Transition of Care (TOC) - CM/SW Discharge Note Marvetta Gibbons RN, BSN Transitions of Care Unit 4E- RN Case Manager See Treatment Team for direct phone #    Patient Details  Name: Raymond [name] MRN: [id number] Date of Birth: Feb 20, 1961  Transition of Care [facility]) CM/SW Contact:  Dawayne Patricia, RN Phone Number: 12/13/2020, 2:02 PM   Clinical Narrative:    Pt stable for transition home pending home wound VAC.  Notified by [employer] that pt has been approved for home [facility]- however pt has a $250 copay that he is required to pay up front. Per pt he can not afford this copay. Discussed with pt home wound care, per pt his daughter will be able to assist him with dressing changes.   Notified attending and Vascular- Dr. Scot Dock- pt will need to go home on daily drsg changes. HH has been arranged with [facility] for RN/PT/OT. - call made to [facility] with [facility] for updated wound care needs.   Per Dr. Scot Dock pt will need- Hydrogel, then pack with moist 4 x 4, then cover with dry 4 x 4's, Kerlix, and 4 inch Ace. Change daily   Final next level of care: [place] Barriers to Discharge: Barriers Resolved   Patient Goals and CMS Choice Patient states their goals for this hospitalization and ongoing recovery are:: return home CMS Medicare.gov Compare Post Acute Care list provided to:: Patient Choice offered to / list presented to : Patient  Discharge Placement               Home w/ [facility]        Discharge Plan and Services   Discharge Planning Services: CM Consult Post Acute Care Choice: Durable Medical Equipment, Home Health          DME Arranged:  (pt unable to pay copay) DME Agency: [facility] Date DME Agency Contacted: 12/11/20 Time DME Agency Contacted: 0233 Representative spoke with at DME Agency: Olivia Mackie HH Arranged: RN, PT, OT [facility] Agency: [place] Date [place]: 12/11/20 Time [place]: 75 Representative spoke with at [place]:  [place]) Interventions     Readmission Risk Interventions Readmission Risk Prevention Plan 12/13/2020  Transportation Screening Complete  PCP or Specialist Appt within 5-7 Days Complete  Home Care Screening Complete  Medication Review (RN CM) Complete  Some recent data might be hidden

## 2020-12-13 NOTE — Progress Notes (Signed)
   VASCULAR SURGERY ASSESSMENT & PLAN:   POD 5 RIGHT ILIAC PTA/STENT - RIGHT FEMPOP - OPEN 5TH TOE AMP: Palpable right dorsalis pedis pulse.  The toe amputation site looks good as documented in the photograph below.  VAC change 3 times a week.   VASCULAR QUALITY INITIATIVE: The patient is on aspirin, Plavix, and a statin.   DVT PROPHYLAXIS: He is getting subcu heparin.   DISPOSITION: Likely for discharge today.  We have arranged follow-up..   SUBJECTIVE:   No complaints  PHYSICAL EXAM:   Vitals:   12/12/20 1949 12/12/20 2310 12/13/20 0456 12/13/20 0500  BP: 123/76  116/76   Pulse: 77 74 75   Resp: 15  17   Temp: 98 F (36.7 C) 97.8 F (36.6 C) 97.8 F (36.6 C)   TempSrc: Oral Oral Oral   SpO2: 97% 97% 97%   Weight:    61.5 kg  Height:       I removed his dressing today and the VAC removed replaced later today.  The wound so far looks good.  He has a palpable right dorsalis pedis pulse.    LABS:   Lab Results  Component Value Date   WBC 13.2 (H) 12/12/2020   HGB 9.4 (L) 12/12/2020   HCT 29.3 (L) 12/12/2020   MCV 83.5 12/12/2020   PLT 474 (H) 12/12/2020   Lab Results  Component Value Date   CREATININE 0.91 12/12/2020   Lab Results  Component Value Date   INR 1.04 01/22/2009   CBG (last 3)  Recent Labs    12/12/20 2042 12/12/20 2114 12/13/20 0645  GLUCAP 64* 121* 69*    PROBLEM LIST:    Principal Problem:   Occlusion of right iliac artery (HCC) Active Problems:   Gastroesophageal reflux disease   Chronic pain syndrome   Type 2 diabetes mellitus without complication (HCC)   COPD (chronic obstructive pulmonary disease) (HCC)   Anxiety and depression   Hyperlipidemia   Tobacco abuse   Peripheral vascular disease of lower extremity with ulceration (HCC)   Dry gangrene (HCC)   Chronic respiratory failure (HCC)   CURRENT MEDS:    allopurinol  100 mg Oral q AM   aspirin EC  81 mg Oral Q0600   buPROPion  150 mg Oral Q0600   clopidogrel  75 mg  Oral Q0600   cyclobenzaprine  5 mg Oral QHS   docusate sodium  100 mg Oral Daily   feeding supplement  237 mL Oral BID BM   gabapentin  300 mg Oral TID   heparin  5,000 Units Subcutaneous Q8H   magnesium oxide  400 mg Oral Daily   metFORMIN  1,000 mg Oral BID WC   metoprolol tartrate  12.5 mg Oral BID   multivitamin with minerals  1 tablet Oral Daily   mupirocin ointment  1 application Nasal BID   nicotine  21 mg Transdermal Daily   pantoprazole  80 mg Oral Daily   rosuvastatin  20 mg Oral QHS   sodium chloride flush  3 mL Intravenous Q12H   sulfamethoxazole-trimethoprim  1 tablet Oral Q12H   tamsulosin  0.4 mg Oral q AM   vitamin B-12  1,000 mcg Oral Daily    [name] Office: [phone] 12/13/2020

## 2020-12-13 NOTE — Consult Note (Signed)
[facility] Nurse Consult Note: Patient receiving care in [place] Patient states he is leaving today regardless of vac machine arriving. If patient leaves before machine arrives then dressing will have to be removed and a moist saline gauze placed into the wound and secured with dry gauze, kerlix and ace wrap.  Reason for Consult: Re-start NPWT Wound type: Surgical  Pressure Injury POA: NA Wound bed: Pink  Drainage (amount, consistency, odor) serous on dressing removed Periwound: intact Dressing procedure/placement/frequency: Removed Ace wrap, Kerlix and moistened saline gauze. Placed one piece of black foam in the wound. Outlined the wound with barrier rings (1 1/2). Drape applied, immediate suction obtained at 125 mmHg. Patient tolerated procedure well.   Thank you for the consult. [place] nurse will follow M/W/F for vac dressing change if not discharged today.   Please re-consult the [place] team if needed.  Cathlean Marseilles Tamala Julian, MSN, RN, [place], [place]le Pearl, [facility] Wound Treatment Associate Pager [phone]

## 2020-12-13 NOTE — Progress Notes (Signed)
2 episodes of hypoglycemia. 65 last night and 69 this morning : improves quickly with 15 grams

## 2020-12-13 NOTE — Progress Notes (Signed)
Occupational Therapy Treatment Patient Details Name: Raymond [name] MRN: [id number] DOB: 1960/06/09 Today's Date: 12/13/2020   History of present illness Pt is a 60 y.o. male admitted 12/05/20 after fall at home due to ongoing pain in his R foot. 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 PMH includes DM II, COPD, HLD, PVD, tobacco use.   OT comments  Pt with c/o indigestion and nausea, assisted pt to stand to urinate and OOB to chair. RN notified. Reinforced importance of pt using darco shoe along a with shoe on his L foot to help balance the height difference. Updated recommendation for tub equipment to tub transfer bench, pt in agreement.   Recommendations for follow up therapy are one component of a multi-disciplinary discharge planning process, led by the attending physician.  Recommendations may be updated based on patient status, additional functional criteria and insurance authorization.    Follow Up Recommendations  Home health OT;Supervision - Intermittent    Equipment Recommendations  Tub/shower bench    Recommendations for Other Services      Precautions / Restrictions Precautions Precautions: Fall Required Braces or Orthoses: Other Brace Other Brace: darco shoe RLE Restrictions RLE Weight Bearing:  (WB through heel)       Mobility Bed Mobility Overal bed mobility: Modified Independent Bed Mobility: Supine to Sit           General bed mobility comments: HOB up, increased time, use of rail    Transfers Overall transfer level: Needs assistance Equipment used: Rolling walker (2 wheeled) Transfers: Sit to/from Stand Sit to Stand: Min guard         General transfer comment: increased time for balance, elevated bed slightly    Balance Overall balance assessment: Needs assistance   Sitting balance-Leahy Scale: Good     Standing balance  support: Bilateral upper extremity supported;During functional activity Standing balance-Leahy Scale: Poor Standing balance comment: reliant on RW and min external support                           ADL either performed or assessed with clinical judgement   ADL Overall ADL's : Needs assistance/impaired     Grooming: Set up;Wash/dry hands;Wash/dry face;Sitting       Lower Body Bathing: Minimal assistance;Sit to/from stand   Upper Body Dressing : Set up;Sitting   Lower Body Dressing: Minimal assistance;Sitting/lateral leans Lower Body Dressing Details (indicate cue type and reason): for darco shoe on R and sock on L Toilet Transfer: Minimal assistance;RW Toilet Transfer Details (indicate cue type and reason): to use urinal Toileting- Clothing Manipulation and Hygiene: Minimal assistance (standing)               Vision       Perception     Praxis      Cognition Arousal/Alertness: Awake/alert Behavior During Therapy: WFL for tasks assessed/performed Overall Cognitive Status: Within Functional Limits for tasks assessed                                          Exercises     Shoulder Instructions       General Comments      Pertinent Vitals/ Pain       Pain Assessment: Faces Faces Pain Scale: Hurts even more Pain  Location: R foot Pain Descriptors / Indicators: Grimacing;Guarding;Discomfort Pain Intervention(s): Monitored during session;Repositioned  Home Living                                          Prior Functioning/Environment              Frequency  Min 2X/week        Progress Toward Goals  OT Goals(current goals can now be found in the care plan section)  Progress towards OT goals: Progressing toward goals  Acute Rehab OT Goals Patient Stated Goal: home OT Goal Formulation: With patient Time For Goal Achievement: 12/24/20 Potential to Achieve Goals: Good  Plan Discharge plan remains  appropriate    Co-evaluation                 AM-PAC OT "6 Clicks" Daily Activity     Outcome Measure   Help from another person eating meals?: None Help from another person taking care of personal grooming?: A Little Help from another person toileting, which includes using toliet, bedpan, or urinal?: A Little Help from another person bathing (including washing, rinsing, drying)?: A Little Help from another person to put on and taking off regular upper body clothing?: A Little Help from another person to put on and taking off regular lower body clothing?: A Little 6 Click Score: 19    End of Session Equipment Utilized During Treatment: Gait belt;Rolling walker  OT Visit Diagnosis: Unsteadiness on feet (R26.81);Other abnormalities of gait and mobility (R26.89);Muscle weakness (generalized) (M62.81)   Activity Tolerance Patient tolerated treatment well   Patient Left in chair;with call bell/phone within reach   Nurse Communication Other (comment) (pt with nausea and indigestion)        Time: 1638-4665 OT Time Calculation (min): 20 min  Charges: OT General Charges $OT Visit: 1 Visit OT Treatments $Self Care/Home Management : 8-22 mins  Nestor Lewandowsky, OTR/L Acute Rehabilitation Services Pager: [phone] Office: [phone]   [name] 12/13/2020, 9:46 AM

## 2020-12-13 NOTE — Progress Notes (Signed)
Staff smelled cigarette smoke coming from patients room. RN asked patient about it, patient declined smoking. Stated that the smell is coming from someone else.   RN educated patient on smoking policy. Found pills in pill boxes in patient's bag. Educated patient on medication administration while he is here. RN asked if medicines can be taken to pharmacy to store, patient declined. Escalated to Therapist, sports.  Edwena Blow, RN

## 2020-12-13 NOTE — Progress Notes (Signed)
Patient declined hydrogel placement for R fifth toe wound. RN placed saline soaked gauze, dry gauze, kerlix, and ACE wrap.  Edwena Blow, RN

## 2020-12-13 NOTE — Progress Notes (Signed)
Patient wanting to leave AMA. RN contacted Dr. Rodena Piety to get D/C order. Now waiting on hydrogel to be delivered in order to dress toe wound prior to leaving. Patient states that he may not wait for hydrogel to be delivered. Edwena Blow, RN

## 2020-12-13 NOTE — Progress Notes (Signed)
Mobility Specialist Progress Note:   12/13/20 1115  Therapy Vitals  Pulse Rate 75  BP 100/64  Patient Position (if appropriate) Lying  Mobility  Activity Ambulated in room  Level of Assistance Moderate assist, patient does 50-74%  Assistive Device Front wheel walker  Distance Ambulated (ft) 30 ft  Mobility Ambulated with assistance in room  Mobility Response Tolerated well  Mobility performed by Mobility specialist  $Mobility charge 1 Mobility   Pre Mobility: HR 75 bpm; BP 100/64 During Mobility: HR 83 bpm Post Mobility: HR 90 bpm; BP 94/64  Pt was received in bed and agreed to mobility. Ambulated in room into hallway 30' with RW and darco shoe. Pt still has major posterior LOBs during ambulation and started leaning heavily to the left as distance increased and fatigue set in. Tolerated well. Left sitting EOB with all needs met and bed alarm on.  Nelta Numbers Mobility Specialist  Phone [phone]'

## 2020-12-15 DIAGNOSIS — J449 Chronic obstructive pulmonary disease, unspecified: Secondary | ICD-10-CM | POA: Diagnosis not present

## 2020-12-15 DIAGNOSIS — M109 Gout, unspecified: Secondary | ICD-10-CM | POA: Diagnosis not present

## 2020-12-15 DIAGNOSIS — M47816 Spondylosis without myelopathy or radiculopathy, lumbar region: Secondary | ICD-10-CM | POA: Diagnosis not present

## 2020-12-15 DIAGNOSIS — I10 Essential (primary) hypertension: Secondary | ICD-10-CM | POA: Diagnosis not present

## 2020-12-15 DIAGNOSIS — Z4781 Encounter for orthopedic aftercare following surgical amputation: Secondary | ICD-10-CM | POA: Diagnosis not present

## 2020-12-15 DIAGNOSIS — I70201 Unspecified atherosclerosis of native arteries of extremities, right leg: Secondary | ICD-10-CM | POA: Diagnosis not present

## 2020-12-15 DIAGNOSIS — Z89421 Acquired absence of other right toe(s): Secondary | ICD-10-CM | POA: Diagnosis not present

## 2020-12-15 DIAGNOSIS — Z48812 Encounter for surgical aftercare following surgery on the circulatory system: Secondary | ICD-10-CM | POA: Diagnosis not present

## 2020-12-15 DIAGNOSIS — E1151 Type 2 diabetes mellitus with diabetic peripheral angiopathy without gangrene: Secondary | ICD-10-CM | POA: Diagnosis not present

## 2020-12-15 NOTE — Discharge Summary (Signed)
Physician Discharge Summary  Raymond [name]:[id number] DOB: June 10, 1960 DOA: 12/05/2020  PCP: [name], MD  Admit date: 12/05/2020 Discharge date: 12/15/2020  Admitted From: Home Disposition: Home  Recommendations for Outpatient Follow-up:  Follow up with PCP in 1-2 weeks Please obtain BMP/CBC in one week Please follow up with vascular surgery Home Health: Yes Equipment/Devices: None   Discharge Condition: Stable CODE STATUS full code Diet recommendation: Cardiac Brief/Interim Summary:60 year old M with PMH of COPD, DM-2 with neuropathy, chronic pain on oxycodone, anxiety, depression, HLD, gout and tobacco use disorder (2 PPD) presenting with increased right foot pain and ulcer, and admitted with critical right limb ischemia due to common iliac artery thrombosis extending into external and internal with distal embolize and multivessel PAD in right lower extremity.  He also had leukocytosis and mild tachycardia.  He was started on IV heparin and IV vancomycin.   Patient underwent right CFA endarterectomy, angioplasty and stenting of right common iliac artery and external iliac artery, right femoropopliteal bypass, right fifth toe ray amputation with VAC placement by Dr. Scot Dock on 12/08/2020.  Change IV vancomycin to Bactrim to treat for possible cellulitis.     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  Initially patient was supposed to go home with the wound VAC however he was not able to pay the $250 of co-pay so he was sent home without the wound VAC. Wound care instructions were given and demonstrated by the staff prior to discharge.  Bactrim given for 10 days.   Chronic COPD: Stable.  Patient was smoking in the room prior to discharge.  Controlled DM-2 with borderline low blood glucose: A1c 5.5%.  On metformin at home. Last Labs          Recent Labs  Lab 12/11/20 1736 12/11/20 2024 12/12/20 0628 12/12/20 1050 12/12/20 1609  GLUCAP 124* 75 60* 97 86    -Continue home metformin. -Continue statin.   Iron deficiency anemia: Slight drop in Hgb likely dilutional.  Iron saturation 8%.  Ferritin 21.  TIBC 217 Vitamin B12 deficiency: B12 141. Recent Labs (within last 365 days)           Recent Labs    12/05/20 2321 12/07/20 0149 12/07/20 2359 12/08/20 1312 12/09/20 0325 12/10/20 0433 12/12/20 0112  HGB 11.4* 10.9* 9.9* 10.9* 9.6* 10.2* 9.4*    -Vitamin B12 injection 1000 mcg x 1, then p.o. vitamin B12   Hypokalemia: Resolved.     Moderate malnutrition/hypoalbuminemia: As evidenced by significant muscle mass and subcu fat loss and hypoalbuminemia and low prealbumin  Nutrition Problem: Increased nutrient needs Etiology: wound healing, post-op healing    Signs/Symptoms: estimated needs     Interventions: Ensure Enlive (each supplement provides 350kcal and 20 grams of protein), MVI  Estimated body mass index is 20.62 kg/m as  calculated from the following:   Height as of this encounter: 5\' 8"  (1.727 m).   Weight as of this encounter: 61.5 kg.  Discharge Instructions  Discharge Instructions     Diet - low sodium heart healthy   Complete by: As directed    Discharge wound care:   Complete by: As directed     Apply hydrogel, packed with 4 x 4 cover with dry gauze and Kerlix change dressings daily   Increase activity slowly   Complete by: As directed       Allergies as of 12/13/2020       Reactions   Morphine And Related Shortness Of Breath, Palpitations   Penicillins Anaphylaxis   Vicodin [hydrocodone-acetaminophen] Shortness Of Breath, Palpitations   Latex Hives, Rash        Medication List     STOP taking these medications    doxycycline 100 MG tablet Commonly known as: ADOXA       TAKE these medications    albuterol 0.63 MG/3ML nebulizer solution Commonly known as: ACCUNEB Take 1 ampule by nebulization every 6 (six) hours as needed for wheezing or shortness of breath.   albuterol 108 (90 Base) MCG/ACT inhaler Commonly known as: VENTOLIN HFA Inhale 2 puffs into the lungs every 6 (six) hours as needed for wheezing.   allopurinol 100 MG tablet Commonly known as: ZYLOPRIM Take 100 mg by mouth in the morning.   aspirin 81 MG tablet Take 81 mg by mouth daily.   buPROPion 150 MG 24 hr tablet Commonly known as: WELLBUTRIN XL Take 1 tablet by mouth daily at 6 (six) AM.   calcium carbonate 750 MG chewable tablet Commonly known as: TUMS EX Chew 2 tablets by mouth as needed for heartburn.   clopidogrel 75 MG tablet Commonly known as: PLAVIX Take 1 tablet (75 mg total) by mouth daily at 6 (six) AM.   diclofenac 50 MG EC tablet Commonly known as: VOLTAREN Take 50 mg by mouth 3 (three) times daily.   Dulcolax Soft Chews 1200 MG Chew Generic drug: Magnesium Hydroxide Chew 1 tablet by mouth as needed (constipation).   gabapentin 300 MG capsule Commonly known as: NEURONTIN Take 300 mg by mouth 3 (three) times daily.   ibuprofen 600 MG tablet Commonly known as: ADVIL Take 600 mg by mouth every 6 (six) hours as needed for moderate pain or mild pain.   magnesium oxide 400 MG tablet Commonly known as: MAG-OX Take 1 tablet by mouth daily.   metFORMIN 500 MG  tablet Commonly known as: GLUCOPHAGE Take 2 tablets by mouth 2 (two) times daily.   metoprolol tartrate 25 MG tablet Commonly known as: LOPRESSOR TAKE 1/2 TABLET BY MOUTH TWICE DAILY   omeprazole 40 MG capsule Commonly known as: PRILOSEC Take 1 capsule by mouth daily at 6 (six) AM.   ondansetron 4 MG tablet Commonly known as: ZOFRAN Take 4 mg by mouth 3 (three) times daily as needed.   oxyCODONE-acetaminophen 10-325 MG tablet Commonly known as: PERCOCET Take 1-2 tablets by mouth 4 (four) times daily as needed for pain. max FIVE PER DAY   rosuvastatin 10 MG tablet Commonly known as: CRESTOR Take 2 tablets (20 mg total) by mouth at bedtime. What changed: how much to take   silver sulfADIAZINE 1 % cream Commonly known as: SILVADENE Apply 1 application topically 2 (two) times daily. apply a 1/16 inch (1.5 mm) thick layer to entire burn area   sulfamethoxazole-trimethoprim 800-160 MG tablet Commonly known as: BACTRIM DS  Take 1 tablet by mouth 2 (two) times daily.   tamsulosin 0.4 MG Caps capsule Commonly known as: FLOMAX Take 0.4 mg by mouth in the morning.               Discharge Care Instructions  (From admission, onward)           Start     Ordered   12/13/20 0000  Discharge wound care:       Comments: Apply hydrogel, packed with 4 x 4 cover with dry gauze and Kerlix change dressings daily   12/13/20 1456            Follow-up Information     [facility] Follow up in 3 week(s).   Specialty: Vascular Surgery Why: Office will call you to arrange your appt (sent) Contact information: [place]   Specialty: Home Health Services Why: HHPT/OT/-RN - they will contact you to schedule home visits- wound care needs-Hydrogel, then pack with moist 4 x 4, then cover with dry 4 x 4's, Kerlix, and 4 inch Ace.  Change daily. Contact  information: [address] [place] [address] [phone]                Allergies  Allergen Reactions   Morphine And Related Shortness Of Breath and Palpitations   Penicillins Anaphylaxis   Vicodin [Hydrocodone-Acetaminophen] Shortness Of Breath and Palpitations   Latex Hives and Rash    Consultations:  VASCULAR surgery  Procedures/Studies: DG Ribs Unilateral W/Chest Left  Result Date: 12/06/2020 CLINICAL DATA:  Fall, left rib pain EXAM: LEFT RIBS AND CHEST - 3+ VIEW COMPARISON:  CT chest dated 09/02/2013 FINDINGS: Lungs are clear.  No pleural effusion or pneumothorax. The heart is normal in size. No displaced left rib fracture is seen. Cervical spine fixation hardware. IMPRESSION: No displaced left rib fracture is seen. No evidence of acute cardiopulmonary disease. Electronically Signed   By: Julian Hy M.D.   On: 12/06/2020 00:30   CT ANGIO AO+BIFEM W & OR WO CONTRAST  Result Date: 12/06/2020 CLINICAL DATA:  Right fourth and 5th toe gangrene, Arterial embolism, lower extremity EXAM: CT ANGIOGRAPHY OF ABDOMINAL AORTA WITH ILIOFEMORAL RUNOFF TECHNIQUE: Multidetector CT imaging of the abdomen, pelvis and lower extremities was performed using the standard protocol during bolus administration of intravenous contrast. Multiplanar CT image reconstructions and MIPs were obtained to evaluate the vascular anatomy. CONTRAST:  14mL OMNIPAQUE IOHEXOL 350 MG/ML SOLN COMPARISON:  CTA abdomen pelvis 10/21/2012 FINDINGS: VASCULAR Aorta: Normal caliber. Moderate circumferential atheromatous plaque within the a infrarenal abdominal aorta. No aneurysm. No hemodynamically significant stenosis. No dissection. No periaortic inflammatory change. Celiac: Unremarkable SMA: Unremarkable Renals: Single right and dual left renal arteries. Tiny accessory renal artery on the left supplies the lower pole. Wide patency of the renal vasculature. Normal vascular morphology. No aneurysm. IMA:  High-grade focal stenosis (greater than 75%) at the origin. The vessel distally is diminutive, but patent. RIGHT Lower Extremity Inflow: There is thrombosis of the right common iliac artery likely related to insight to thrombosis with bulbous appearing clot seen extending inferiorly from the right common iliac vein to extend into the right internal iliac vein and protrude into the origin of the right external iliac vein. There are several small distal emboli noted within the right internal iliac artery. The right external iliac artery is diseased, but diminutive without  hemodynamically significant focal stenosis. Outflow: There is bulbous eccentric mural thrombus within the right common femoral arterial bifurcation resulting in greater than 75% stenoses of the CFA at the takeoff of the anterior and posterior circumflex femoral arteries most in keeping with a distal embolus. The profundus femoral artery is patent but diminutive. The superficial femoral artery is heavily diseased with largely nonocclusive plaque and diminutive demonstrating roughly 50% stenosis throughout its course. Runoff: Atheromatous plaque results in a diffuse 50% stenosis of the popliteal artery. Relatively high takeoff of the right anterior tibial artery from the P2 segment of the popliteal artery the anterior tibial artery continues to form the dorsalis pedis artery. The peroneal artery terminates at the level of the distal tibia diaphysis. The posterior tibial artery terminates within the mid right lower extremity. No distal reconstitution of the posterior tibial artery or plantar arch. LEFT Lower Extremity Inflow: Heavily disease without focal hemodynamically significant stenosis. Internal iliac artery is patent. Outflow: Focal 50% stenosis of the SFA at its origin. The profundus femoral artery is widely patent. The SFA distally is heavily disease with a focal 50-75% stenosis noted within the mid SFA. Runoff: Popliteal artery is moderately  diseased with diffuse 50% stenosis. Variant takeoff of the anterior tibial artery from the P2 segment of the popliteal artery. Three-vessel runoff to the left foot. Dorsalis pedis and posterior tibial arteries are patent. Veins: No obvious venous abnormality within the limitations of this arterial phase study. Review of the MIP images confirms the above findings. NON-VASCULAR Lower chest: Visualized lung bases are clear. Visualized heart and pericardium are unremarkable. Hepatobiliary: No focal liver abnormality is seen. Status post cholecystectomy. No biliary dilatation. Pancreas: Unremarkable Spleen: Unremarkable Adrenals/Urinary Tract: The adrenal glands are unremarkable. The kidneys are normal. Streak artifact from right hip arthroplasty largely obscures the bladder, however, the visualized segment is unremarkable. Stomach/Bowel: Stomach, small bowel, and large bowel are unremarkable. Lymphatic: No pathologic adenopathy within the abdomen and pelvis. Reproductive: Prostate is unremarkable. Other: No abdominal wall hernia. Musculoskeletal: Right total hip arthroplasty has been performed. Degenerative changes are seen within the lumbar spine. L3-4 lumbar fusion with instrumentation has been performed. 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 Evidence of distal embolization with multiple emboli within the right internal iliac artery peripherally as well as within the right common femoral artery resulting in a greater than 75% stenosis of this vessel distally. Superimposed peripheral vascular disease with extensive disease of the right lower extremity outflow and runoff as described above with single-vessel runoff to the foot. Posterior tibial artery terminates at the level of the mid diaphysis of the tibia but demonstrates a gradual washout and lack of significant associated plaque  making this suspicious for occlusion secondary to distal embolization with poor collateralization. The posterior tibial artery distally and plantar arch are not reconstituted. Dorsalis pedis artery is patent. Extensive peripheral vascular disease with multifocal stenosis of the left SFA and diffuse stenosis of the left popliteal artery. Three-vessel runoff to the left foot with continuation of the dorsalis pedis artery and plantar arch. NON-VASCULAR None significant These results were called by telephone at the time of interpretation on 12/06/2020 at 4:03 am to provider [facility] , who verbally acknowledged these results. Electronically Signed   By: Fidela Salisbury M.D.   On: 12/06/2020 04:05   DG Foot Complete Right  Result Date: 12/06/2020 CLINICAL DATA:  Discoloration to right 4th  and 5th digits EXAM: RIGHT FOOT COMPLETE - 3+ VIEW COMPARISON:  None. FINDINGS: No fracture or dislocation is seen. The joint spaces are preserved. No cortical irregularity involving the 4th or 5th digit to suggest acute osteomyelitis. Suspected soft tissue irregularity/ulcer along the lateral aspect of the 5th metacarpal head. IMPRESSION: Suspected soft tissue irregularity/ulcer along the lateral aspect of the 5th metacarpal head. No radiographic findings to suggest acute osteomyelitis. Electronically Signed   By: Julian Hy M.D.   On: 12/06/2020 00:39   VAS [place] LOWER EXTREMITY SAPHENOUS VEIN MAPPING  Result Date: 12/06/2020 LOWER EXTREMITY VEIN MAPPING Patient Name:  Raymond [name]  Date of Exam:   12/06/2020 Medical Rec #: [id number]    Accession #:    [id number] Date of Birth: Jul 15, 1960    Patient Gender: M Patient Age:   28 years Exam Location:  [facility] Procedure:      VAS [place] LOWER EXTREMITY SAPHENOUS VEIN MAPPING Referring Phys: Harrell Gave DICKSON --------------------------------------------------------------------------------  Indications:  PVD w/ gangrene right lower extremity Risk Factors: Hyperlipidemia,  Diabetes, current smoker.  Comparison Study: No prior studies. Performing Technologist: Darlin Coco RDMS, RVT  Examination Guidelines: A complete evaluation includes B-mode imaging, spectral Doppler, color Doppler, and power Doppler as needed of all accessible portions of each vessel. Bilateral testing is considered an integral part of a complete examination. Limited examinations for reoccurring indications may be performed as noted. +------------+-----------+----------------------+------------+-----------------+ RT Diameter RT Findings         GSV          LT Diameter    LT Findings        (cm)                                         (cm)                      +------------+-----------+----------------------+------------+-----------------+     0.43     branching     Saphenofemoral        0.40                                                    Junction                                     +------------+-----------+----------------------+------------+-----------------+     0.33                   Proximal thigh        0.32                      +------------+-----------+----------------------+------------+-----------------+     0.24     branching       Mid thigh           0.25        branching     +------------+-----------+----------------------+------------+-----------------+     0.22                    Distal thigh         0.19        branching     +------------+-----------+----------------------+------------+-----------------+  0.24                        Knee                     branching and not                                                           well visualized  +------------+-----------+----------------------+------------+-----------------+     0.21     branching       Prox calf           0.13                      +------------+-----------+----------------------+------------+-----------------+     0.14                      Mid calf            0.11                      +------------+-----------+----------------------+------------+-----------------+     0.19                    Distal calf          0.13                      +------------+-----------+----------------------+------------+-----------------+ +----------------+-----------+---------------+----------------+-----------+ RT diameter (cm)RT Findings      SSV      LT Diameter (cm)LT Findings +----------------+-----------+---------------+----------------+-----------+       0.34                 Popliteal fossa                            +----------------+-----------+---------------+----------------+-----------+       0.31                  Proximal calf                             +----------------+-----------+---------------+----------------+-----------+       0.32                    Mid calf                                +----------------+-----------+---------------+----------------+-----------+       0.25                   Distal calf                              +----------------+-----------+---------------+----------------+-----------+ Diagnosing physician: Jamelle Haring Electronically signed by Jamelle Haring on 12/06/2020 at 5:23:20 PM.    Final    HYBRID OR IMAGING ([place])  Result Date: 12/08/2020 There is no interpretation for this exam.  This order is for images obtained during a surgical procedure.  Please See "Surgeries" Tab for more information regarding the procedure.   HYBRID OR IMAGING ([facility] ONLY)  Result Date: 12/08/2020 There is no  interpretation for this exam.  This order is for images obtained during a surgical procedure.  Please See "Surgeries" Tab for more information regarding the procedure.   (Echo, Carotid, EGD, Colonoscopy, ERCP)    Subjective: Is anxious to go home   Discharge Exam: Vitals:   12/13/20 0722 12/13/20 1115  BP: 97/78 100/64  Pulse: 85 75  Resp: 15   Temp: 97.9 F (36.6 C)   SpO2: 98%    Vitals:    12/13/20 0456 12/13/20 0500 12/13/20 0722 12/13/20 1115  BP: 116/76  97/78 100/64  Pulse: 75  85 75  Resp: 17  15   Temp: 97.8 F (36.6 C)  97.9 F (36.6 C)   TempSrc: Oral  Oral   SpO2: 97%  98%   Weight:  61.5 kg    Height:        General: Pt is alert, awake, not in acute distress Cardiovascular: RRR, S1/S2 +, no rubs, no gallops Respiratory: CTA bilaterally, no wheezing, no rhonchi Abdominal: Soft, NT, ND, bowel sounds + Extremities: no edema, no cyanosis    The results of significant diagnostics from this hospitalization (including imaging, microbiology, ancillary and laboratory) are listed below for reference.     Microbiology: Recent Results (from the past 240 hour(s))  Resp Panel by RT-PCR (Flu A&B, Covid) Nasopharyngeal Swab     Status: None   Collection Time: 12/06/20  4:32 AM   Specimen: Nasopharyngeal Swab; Nasopharyngeal(NP) swabs in vial transport medium  Result Value Ref Range Status   SARS Coronavirus 2 by RT PCR NEGATIVE NEGATIVE Final    Comment: (NOTE) SARS-CoV-2 target nucleic acids are NOT DETECTED.  The SARS-CoV-2 RNA is generally detectable in upper respiratory specimens during the acute phase of infection. The lowest concentration of SARS-CoV-2 viral copies this assay can detect is 138 copies/mL. A negative result does not preclude SARS-Cov-2 infection and should not be used as the sole basis for treatment or other patient management decisions. A negative result may occur with  improper specimen collection/handling, submission of specimen other than nasopharyngeal swab, presence of viral mutation(s) within the areas targeted by this assay, and inadequate number of viral copies(<138 copies/mL). A negative result must be combined with clinical observations, patient history, and epidemiological information. The expected result is Negative.  Fact Sheet for Patients:  EntrepreneurPulse.com.au  Fact Sheet for Healthcare Providers:   IncredibleEmployment.be  This test is no t yet approved or cleared by the [place] FDA and  has been authorized for detection and/or diagnosis of SARS-CoV-2 by FDA under an Emergency Use Authorization (EUA). This EUA will remain  in effect (meaning this test can be used) for the duration of the COVID-19 declaration under Section 564(b)(1) of the Act, 21 U.S.C.section 360bbb-3(b)(1), unless the authorization is terminated  or revoked sooner.       Influenza A by PCR NEGATIVE NEGATIVE Final   Influenza B by PCR NEGATIVE NEGATIVE Final    Comment: (NOTE) The Xpert Xpress SARS-CoV-2/FLU/RSV plus assay is intended as an aid in the diagnosis of influenza from Nasopharyngeal swab specimens and should not be used as a sole basis for treatment. Nasal washings and aspirates are unacceptable for Xpert Xpress SARS-CoV-2/FLU/RSV testing.  Fact Sheet for Patients: EntrepreneurPulse.com.au  Fact Sheet for Healthcare Providers: IncredibleEmployment.be  This test is not yet approved or cleared by the [place] FDA and has been authorized for detection and/or diagnosis of SARS-CoV-2 by FDA under an Emergency Use Authorization (EUA). This EUA will remain in effect (meaning  this test can be used) for the duration of the COVID-19 declaration under Section 564(b)(1) of the Act, 21 U.S.C. section 360bbb-3(b)(1), unless the authorization is terminated or revoked.  Performed at [facility], [address], [place], [place] [address]   MRSA Next Gen by PCR, Nasal     Status: Abnormal   Collection Time: 12/08/20  5:24 AM   Specimen: Nasal Mucosa; Nasal Swab  Result Value Ref Range Status   MRSA by PCR Next Gen DETECTED (A) NOT DETECTED Final    Comment: RESULT CALLED TO, READ BACK BY AND VERIFIED WITH: H WIESNER,RN@[id number]  12/08/20 [place] (NOTE) The GeneXpert MRSA Assay (FDA approved for NASAL specimens only), is one component of a  comprehensive MRSA colonization surveillance program. It is not intended to diagnose MRSA infection nor to guide or monitor treatment for MRSA infections. Test performance is not FDA approved in patients less than 28 years old. Performed at [facility], [place] [address], [place], [place] [address]      Labs: BNP (last 3 results) No results for input(s): BNP in the last 8760 hours. Basic Metabolic Panel: Recent Labs  Lab 12/09/20 0325 12/10/20 0641 12/12/20 0112  NA 134* 136 136  K 4.0 3.9 4.1  CL 103 102 102  CO2 25 25 28   GLUCOSE 165* 79 67*  BUN 7 7 10   CREATININE 0.71 0.70 0.91  CALCIUM 7.7* 7.7* 7.8*  MG 1.8 1.8 2.0  PHOS 3.8 4.0 4.3   Liver Function Tests: Recent Labs  Lab 12/09/20 0325 12/10/20 0641 12/12/20 0112  ALBUMIN <1.5* <1.5* <1.5*   No results for input(s): LIPASE, AMYLASE in the last 168 hours. No results for input(s): AMMONIA in the last 168 hours. CBC: Recent Labs  Lab 12/09/20 0325 12/10/20 0433 12/12/20 0112  WBC 14.1* 13.0* 13.2*  HGB 9.6* 10.2* 9.4*  HCT 30.1* 31.9* 29.3*  MCV 83.1 83.5 83.5  PLT 388 414* 474*   Cardiac Enzymes: No results for input(s): CKTOTAL, CKMB, CKMBINDEX, TROPONINI in the last 168 hours. BNP: Invalid input(s): POCBNP CBG: Recent Labs  Lab 12/12/20 2042 12/12/20 2114 12/13/20 0645 12/13/20 0718 12/13/20 1205  GLUCAP 64* 121* 69* 75 75   D-Dimer No results for input(s): DDIMER in the last 72 hours. Hgb A1c No results for input(s): HGBA1C in the last 72 hours. Lipid Profile No results for input(s): CHOL, HDL, LDLCALC, TRIG, CHOLHDL, LDLDIRECT in the last 72 hours. Thyroid function studies No results for input(s): TSH, T4TOTAL, T3FREE, THYROIDAB in the last 72 hours.  Invalid input(s): FREET3 Anemia work up No results for input(s): VITAMINB12, FOLATE, FERRITIN, TIBC, IRON, RETICCTPCT in the last 72 hours. Urinalysis    Component Value Date/Time   COLORURINE YELLOW 11/11/2009 1455    APPEARANCEUR CLEAR 11/11/2009 1455   LABSPEC <1.005 (L) 11/11/2009 1455   PHURINE 6.5 11/11/2009 1455   GLUCOSEU NEGATIVE 11/11/2009 1455   HGBUR NEGATIVE 11/11/2009 1455   BILIRUBINUR NEGATIVE 11/11/2009 1455   KETONESUR NEGATIVE 11/11/2009 1455   PROTEINUR NEGATIVE 11/11/2009 1455   UROBILINOGEN 0.2 11/11/2009 1455   NITRITE NEGATIVE 11/11/2009 1455   LEUKOCYTESUR  11/11/2009 1455    NEGATIVE MICROSCOPIC NOT DONE ON URINES WITH NEGATIVE PROTEIN, BLOOD, LEUKOCYTES, NITRITE, OR GLUCOSE <1000 mg/dL.   Sepsis Labs Invalid input(s): PROCALCITONIN,  WBC,  LACTICIDVEN Microbiology Recent Results (from the past 240 hour(s))  Resp Panel by RT-PCR (Flu A&B, Covid) Nasopharyngeal Swab     Status: None   Collection Time: 12/06/20  4:32 AM  Specimen: Nasopharyngeal Swab; Nasopharyngeal(NP) swabs in vial transport medium  Result Value Ref Range Status   SARS Coronavirus 2 by RT PCR NEGATIVE NEGATIVE Final    Comment: (NOTE) SARS-CoV-2 target nucleic acids are NOT DETECTED.  The SARS-CoV-2 RNA is generally detectable in upper respiratory specimens during the acute phase of infection. The lowest concentration of SARS-CoV-2 viral copies this assay can detect is 138 copies/mL. A negative result does not preclude SARS-Cov-2 infection and should not be used as the sole basis for treatment or other patient management decisions. A negative result may occur with  improper specimen collection/handling, submission of specimen other than nasopharyngeal swab, presence of viral mutation(s) within the areas targeted by this assay, and inadequate number of viral copies(<138 copies/mL). A negative result must be combined with clinical observations, patient history, and epidemiological information. The expected result is Negative.  Fact Sheet for Patients:  EntrepreneurPulse.com.au  Fact Sheet for Healthcare Providers:  IncredibleEmployment.be  This test is no t yet  approved or cleared by the [place] FDA and  has been authorized for detection and/or diagnosis of SARS-CoV-2 by FDA under an Emergency Use Authorization (EUA). This EUA will remain  in effect (meaning this test can be used) for the duration of the COVID-19 declaration under Section 564(b)(1) of the Act, 21 U.S.C.section 360bbb-3(b)(1), unless the authorization is terminated  or revoked sooner.       Influenza A by PCR NEGATIVE NEGATIVE Final   Influenza B by PCR NEGATIVE NEGATIVE Final    Comment: (NOTE) The Xpert Xpress SARS-CoV-2/FLU/RSV plus assay is intended as an aid in the diagnosis of influenza from Nasopharyngeal swab specimens and should not be used as a sole basis for treatment. Nasal washings and aspirates are unacceptable for Xpert Xpress SARS-CoV-2/FLU/RSV testing.  Fact Sheet for Patients: EntrepreneurPulse.com.au  Fact Sheet for Healthcare Providers: IncredibleEmployment.be  This test is not yet approved or cleared by the [place] FDA and has been authorized for detection and/or diagnosis of SARS-CoV-2 by FDA under an Emergency Use Authorization (EUA). This EUA will remain in effect (meaning this test can be used) for the duration of the COVID-19 declaration under Section 564(b)(1) of the Act, 21 U.S.C. section 360bbb-3(b)(1), unless the authorization is terminated or revoked.  Performed at [facility], [address] [place], [place] [address]   MRSA Next Gen by PCR, Nasal     Status: Abnormal   Collection Time: 12/08/20  5:24 AM   Specimen: Nasal Mucosa; Nasal Swab  Result Value Ref Range Status   MRSA by PCR Next Gen DETECTED (A) NOT DETECTED Final    Comment: RESULT CALLED TO, READ BACK BY AND VERIFIED WITH: H WIESNER,RN@0717  12/08/20 [place] (NOTE) The GeneXpert MRSA Assay (FDA approved for NASAL specimens only), is one component of a comprehensive MRSA colonization surveillance program. It is not intended to  diagnose MRSA infection nor to guide or monitor treatment for MRSA infections. Test performance is not FDA approved in patients less than 27 years old. Performed at [facility], [place] [address] [place], [place] [address]      Time coordinating discharge: 39 minutes  SIGNED:   Georgette Shell, MD  [facility] 12/15/2020, 4:48 PM

## 2020-12-18 DIAGNOSIS — M109 Gout, unspecified: Secondary | ICD-10-CM | POA: Diagnosis not present

## 2020-12-18 DIAGNOSIS — E1151 Type 2 diabetes mellitus with diabetic peripheral angiopathy without gangrene: Secondary | ICD-10-CM | POA: Diagnosis not present

## 2020-12-18 DIAGNOSIS — Z4781 Encounter for orthopedic aftercare following surgical amputation: Secondary | ICD-10-CM | POA: Diagnosis not present

## 2020-12-18 DIAGNOSIS — Z89421 Acquired absence of other right toe(s): Secondary | ICD-10-CM | POA: Diagnosis not present

## 2020-12-18 DIAGNOSIS — M47816 Spondylosis without myelopathy or radiculopathy, lumbar region: Secondary | ICD-10-CM | POA: Diagnosis not present

## 2020-12-18 DIAGNOSIS — I10 Essential (primary) hypertension: Secondary | ICD-10-CM | POA: Diagnosis not present

## 2020-12-18 DIAGNOSIS — Z48812 Encounter for surgical aftercare following surgery on the circulatory system: Secondary | ICD-10-CM | POA: Diagnosis not present

## 2020-12-18 DIAGNOSIS — I70201 Unspecified atherosclerosis of native arteries of extremities, right leg: Secondary | ICD-10-CM | POA: Diagnosis not present

## 2020-12-18 DIAGNOSIS — J449 Chronic obstructive pulmonary disease, unspecified: Secondary | ICD-10-CM | POA: Diagnosis not present

## 2020-12-19 DIAGNOSIS — I70201 Unspecified atherosclerosis of native arteries of extremities, right leg: Secondary | ICD-10-CM | POA: Diagnosis not present

## 2020-12-19 DIAGNOSIS — J449 Chronic obstructive pulmonary disease, unspecified: Secondary | ICD-10-CM | POA: Diagnosis not present

## 2020-12-19 DIAGNOSIS — I10 Essential (primary) hypertension: Secondary | ICD-10-CM | POA: Diagnosis not present

## 2020-12-19 DIAGNOSIS — E1151 Type 2 diabetes mellitus with diabetic peripheral angiopathy without gangrene: Secondary | ICD-10-CM | POA: Diagnosis not present

## 2020-12-19 DIAGNOSIS — M47816 Spondylosis without myelopathy or radiculopathy, lumbar region: Secondary | ICD-10-CM | POA: Diagnosis not present

## 2020-12-19 DIAGNOSIS — M109 Gout, unspecified: Secondary | ICD-10-CM | POA: Diagnosis not present

## 2020-12-19 DIAGNOSIS — Z4781 Encounter for orthopedic aftercare following surgical amputation: Secondary | ICD-10-CM | POA: Diagnosis not present

## 2020-12-19 DIAGNOSIS — Z89421 Acquired absence of other right toe(s): Secondary | ICD-10-CM | POA: Diagnosis not present

## 2020-12-19 DIAGNOSIS — Z48812 Encounter for surgical aftercare following surgery on the circulatory system: Secondary | ICD-10-CM | POA: Diagnosis not present

## 2020-12-20 DIAGNOSIS — M47816 Spondylosis without myelopathy or radiculopathy, lumbar region: Secondary | ICD-10-CM | POA: Diagnosis not present

## 2020-12-20 DIAGNOSIS — E1151 Type 2 diabetes mellitus with diabetic peripheral angiopathy without gangrene: Secondary | ICD-10-CM | POA: Diagnosis not present

## 2020-12-20 DIAGNOSIS — Z48812 Encounter for surgical aftercare following surgery on the circulatory system: Secondary | ICD-10-CM | POA: Diagnosis not present

## 2020-12-20 DIAGNOSIS — Z89421 Acquired absence of other right toe(s): Secondary | ICD-10-CM | POA: Diagnosis not present

## 2020-12-20 DIAGNOSIS — M109 Gout, unspecified: Secondary | ICD-10-CM | POA: Diagnosis not present

## 2020-12-20 DIAGNOSIS — Z4781 Encounter for orthopedic aftercare following surgical amputation: Secondary | ICD-10-CM | POA: Diagnosis not present

## 2020-12-20 DIAGNOSIS — I10 Essential (primary) hypertension: Secondary | ICD-10-CM | POA: Diagnosis not present

## 2020-12-20 DIAGNOSIS — I70201 Unspecified atherosclerosis of native arteries of extremities, right leg: Secondary | ICD-10-CM | POA: Diagnosis not present

## 2020-12-20 DIAGNOSIS — E1140 Type 2 diabetes mellitus with diabetic neuropathy, unspecified: Secondary | ICD-10-CM | POA: Diagnosis not present

## 2020-12-20 DIAGNOSIS — G894 Chronic pain syndrome: Secondary | ICD-10-CM | POA: Diagnosis not present

## 2020-12-20 DIAGNOSIS — J449 Chronic obstructive pulmonary disease, unspecified: Secondary | ICD-10-CM | POA: Diagnosis not present

## 2020-12-21 DIAGNOSIS — M47816 Spondylosis without myelopathy or radiculopathy, lumbar region: Secondary | ICD-10-CM | POA: Diagnosis not present

## 2020-12-21 DIAGNOSIS — J449 Chronic obstructive pulmonary disease, unspecified: Secondary | ICD-10-CM | POA: Diagnosis not present

## 2020-12-21 DIAGNOSIS — Z89421 Acquired absence of other right toe(s): Secondary | ICD-10-CM | POA: Diagnosis not present

## 2020-12-21 DIAGNOSIS — I70201 Unspecified atherosclerosis of native arteries of extremities, right leg: Secondary | ICD-10-CM | POA: Diagnosis not present

## 2020-12-21 DIAGNOSIS — Z4781 Encounter for orthopedic aftercare following surgical amputation: Secondary | ICD-10-CM | POA: Diagnosis not present

## 2020-12-21 DIAGNOSIS — E1151 Type 2 diabetes mellitus with diabetic peripheral angiopathy without gangrene: Secondary | ICD-10-CM | POA: Diagnosis not present

## 2020-12-21 DIAGNOSIS — Z48812 Encounter for surgical aftercare following surgery on the circulatory system: Secondary | ICD-10-CM | POA: Diagnosis not present

## 2020-12-21 DIAGNOSIS — I10 Essential (primary) hypertension: Secondary | ICD-10-CM | POA: Diagnosis not present

## 2020-12-21 DIAGNOSIS — M109 Gout, unspecified: Secondary | ICD-10-CM | POA: Diagnosis not present

## 2020-12-26 DIAGNOSIS — I70201 Unspecified atherosclerosis of native arteries of extremities, right leg: Secondary | ICD-10-CM | POA: Diagnosis not present

## 2020-12-26 DIAGNOSIS — Z4781 Encounter for orthopedic aftercare following surgical amputation: Secondary | ICD-10-CM | POA: Diagnosis not present

## 2020-12-26 DIAGNOSIS — I10 Essential (primary) hypertension: Secondary | ICD-10-CM | POA: Diagnosis not present

## 2020-12-26 DIAGNOSIS — J449 Chronic obstructive pulmonary disease, unspecified: Secondary | ICD-10-CM | POA: Diagnosis not present

## 2020-12-26 DIAGNOSIS — Z48812 Encounter for surgical aftercare following surgery on the circulatory system: Secondary | ICD-10-CM | POA: Diagnosis not present

## 2020-12-26 DIAGNOSIS — E1151 Type 2 diabetes mellitus with diabetic peripheral angiopathy without gangrene: Secondary | ICD-10-CM | POA: Diagnosis not present

## 2020-12-26 DIAGNOSIS — M47816 Spondylosis without myelopathy or radiculopathy, lumbar region: Secondary | ICD-10-CM | POA: Diagnosis not present

## 2020-12-26 DIAGNOSIS — M109 Gout, unspecified: Secondary | ICD-10-CM | POA: Diagnosis not present

## 2020-12-26 DIAGNOSIS — Z89421 Acquired absence of other right toe(s): Secondary | ICD-10-CM | POA: Diagnosis not present

## 2020-12-28 DIAGNOSIS — E1151 Type 2 diabetes mellitus with diabetic peripheral angiopathy without gangrene: Secondary | ICD-10-CM | POA: Diagnosis not present

## 2020-12-28 DIAGNOSIS — Z89421 Acquired absence of other right toe(s): Secondary | ICD-10-CM | POA: Diagnosis not present

## 2020-12-28 DIAGNOSIS — I10 Essential (primary) hypertension: Secondary | ICD-10-CM | POA: Diagnosis not present

## 2020-12-28 DIAGNOSIS — M47816 Spondylosis without myelopathy or radiculopathy, lumbar region: Secondary | ICD-10-CM | POA: Diagnosis not present

## 2020-12-28 DIAGNOSIS — J449 Chronic obstructive pulmonary disease, unspecified: Secondary | ICD-10-CM | POA: Diagnosis not present

## 2020-12-28 DIAGNOSIS — M109 Gout, unspecified: Secondary | ICD-10-CM | POA: Diagnosis not present

## 2020-12-28 DIAGNOSIS — Z4781 Encounter for orthopedic aftercare following surgical amputation: Secondary | ICD-10-CM | POA: Diagnosis not present

## 2020-12-28 DIAGNOSIS — Z48812 Encounter for surgical aftercare following surgery on the circulatory system: Secondary | ICD-10-CM | POA: Diagnosis not present

## 2020-12-28 DIAGNOSIS — I70201 Unspecified atherosclerosis of native arteries of extremities, right leg: Secondary | ICD-10-CM | POA: Diagnosis not present

## 2020-12-29 DIAGNOSIS — I10 Essential (primary) hypertension: Secondary | ICD-10-CM | POA: Diagnosis not present

## 2020-12-29 DIAGNOSIS — M47816 Spondylosis without myelopathy or radiculopathy, lumbar region: Secondary | ICD-10-CM | POA: Diagnosis not present

## 2020-12-29 DIAGNOSIS — Z89421 Acquired absence of other right toe(s): Secondary | ICD-10-CM | POA: Diagnosis not present

## 2020-12-29 DIAGNOSIS — Z48812 Encounter for surgical aftercare following surgery on the circulatory system: Secondary | ICD-10-CM | POA: Diagnosis not present

## 2020-12-29 DIAGNOSIS — J449 Chronic obstructive pulmonary disease, unspecified: Secondary | ICD-10-CM | POA: Diagnosis not present

## 2020-12-29 DIAGNOSIS — I70201 Unspecified atherosclerosis of native arteries of extremities, right leg: Secondary | ICD-10-CM | POA: Diagnosis not present

## 2020-12-29 DIAGNOSIS — M109 Gout, unspecified: Secondary | ICD-10-CM | POA: Diagnosis not present

## 2020-12-29 DIAGNOSIS — E1151 Type 2 diabetes mellitus with diabetic peripheral angiopathy without gangrene: Secondary | ICD-10-CM | POA: Diagnosis not present

## 2020-12-29 DIAGNOSIS — Z4781 Encounter for orthopedic aftercare following surgical amputation: Secondary | ICD-10-CM | POA: Diagnosis not present

## 2021-01-01 DIAGNOSIS — M47816 Spondylosis without myelopathy or radiculopathy, lumbar region: Secondary | ICD-10-CM | POA: Diagnosis not present

## 2021-01-01 DIAGNOSIS — E1151 Type 2 diabetes mellitus with diabetic peripheral angiopathy without gangrene: Secondary | ICD-10-CM | POA: Diagnosis not present

## 2021-01-01 DIAGNOSIS — J449 Chronic obstructive pulmonary disease, unspecified: Secondary | ICD-10-CM | POA: Diagnosis not present

## 2021-01-01 DIAGNOSIS — Z89421 Acquired absence of other right toe(s): Secondary | ICD-10-CM | POA: Diagnosis not present

## 2021-01-01 DIAGNOSIS — I10 Essential (primary) hypertension: Secondary | ICD-10-CM | POA: Diagnosis not present

## 2021-01-01 DIAGNOSIS — Z4781 Encounter for orthopedic aftercare following surgical amputation: Secondary | ICD-10-CM | POA: Diagnosis not present

## 2021-01-01 DIAGNOSIS — M109 Gout, unspecified: Secondary | ICD-10-CM | POA: Diagnosis not present

## 2021-01-01 DIAGNOSIS — I70201 Unspecified atherosclerosis of native arteries of extremities, right leg: Secondary | ICD-10-CM | POA: Diagnosis not present

## 2021-01-01 DIAGNOSIS — Z48812 Encounter for surgical aftercare following surgery on the circulatory system: Secondary | ICD-10-CM | POA: Diagnosis not present

## 2021-01-02 DIAGNOSIS — J449 Chronic obstructive pulmonary disease, unspecified: Secondary | ICD-10-CM | POA: Diagnosis not present

## 2021-01-02 DIAGNOSIS — M47816 Spondylosis without myelopathy or radiculopathy, lumbar region: Secondary | ICD-10-CM | POA: Diagnosis not present

## 2021-01-02 DIAGNOSIS — Z89421 Acquired absence of other right toe(s): Secondary | ICD-10-CM | POA: Diagnosis not present

## 2021-01-02 DIAGNOSIS — E1151 Type 2 diabetes mellitus with diabetic peripheral angiopathy without gangrene: Secondary | ICD-10-CM | POA: Diagnosis not present

## 2021-01-02 DIAGNOSIS — M109 Gout, unspecified: Secondary | ICD-10-CM | POA: Diagnosis not present

## 2021-01-02 DIAGNOSIS — Z4781 Encounter for orthopedic aftercare following surgical amputation: Secondary | ICD-10-CM | POA: Diagnosis not present

## 2021-01-02 DIAGNOSIS — I70201 Unspecified atherosclerosis of native arteries of extremities, right leg: Secondary | ICD-10-CM | POA: Diagnosis not present

## 2021-01-02 DIAGNOSIS — Z48812 Encounter for surgical aftercare following surgery on the circulatory system: Secondary | ICD-10-CM | POA: Diagnosis not present

## 2021-01-02 DIAGNOSIS — I10 Essential (primary) hypertension: Secondary | ICD-10-CM | POA: Diagnosis not present

## 2021-01-04 ENCOUNTER — Encounter: Payer: Medicare HMO | Admitting: Vascular Surgery

## 2021-01-04 DIAGNOSIS — J449 Chronic obstructive pulmonary disease, unspecified: Secondary | ICD-10-CM | POA: Diagnosis not present

## 2021-01-04 DIAGNOSIS — Z89421 Acquired absence of other right toe(s): Secondary | ICD-10-CM | POA: Diagnosis not present

## 2021-01-04 DIAGNOSIS — M109 Gout, unspecified: Secondary | ICD-10-CM | POA: Diagnosis not present

## 2021-01-04 DIAGNOSIS — Z4781 Encounter for orthopedic aftercare following surgical amputation: Secondary | ICD-10-CM | POA: Diagnosis not present

## 2021-01-04 DIAGNOSIS — M47816 Spondylosis without myelopathy or radiculopathy, lumbar region: Secondary | ICD-10-CM | POA: Diagnosis not present

## 2021-01-04 DIAGNOSIS — I10 Essential (primary) hypertension: Secondary | ICD-10-CM | POA: Diagnosis not present

## 2021-01-04 DIAGNOSIS — Z48812 Encounter for surgical aftercare following surgery on the circulatory system: Secondary | ICD-10-CM | POA: Diagnosis not present

## 2021-01-04 DIAGNOSIS — I70201 Unspecified atherosclerosis of native arteries of extremities, right leg: Secondary | ICD-10-CM | POA: Diagnosis not present

## 2021-01-04 DIAGNOSIS — E1151 Type 2 diabetes mellitus with diabetic peripheral angiopathy without gangrene: Secondary | ICD-10-CM | POA: Diagnosis not present

## 2021-01-08 DIAGNOSIS — E1165 Type 2 diabetes mellitus with hyperglycemia: Secondary | ICD-10-CM | POA: Diagnosis not present

## 2021-01-08 DIAGNOSIS — I10 Essential (primary) hypertension: Secondary | ICD-10-CM | POA: Diagnosis not present

## 2021-01-10 DIAGNOSIS — Z89421 Acquired absence of other right toe(s): Secondary | ICD-10-CM | POA: Diagnosis not present

## 2021-01-10 DIAGNOSIS — J449 Chronic obstructive pulmonary disease, unspecified: Secondary | ICD-10-CM | POA: Diagnosis not present

## 2021-01-10 DIAGNOSIS — M47816 Spondylosis without myelopathy or radiculopathy, lumbar region: Secondary | ICD-10-CM | POA: Diagnosis not present

## 2021-01-10 DIAGNOSIS — Z48812 Encounter for surgical aftercare following surgery on the circulatory system: Secondary | ICD-10-CM | POA: Diagnosis not present

## 2021-01-10 DIAGNOSIS — M109 Gout, unspecified: Secondary | ICD-10-CM | POA: Diagnosis not present

## 2021-01-10 DIAGNOSIS — E1151 Type 2 diabetes mellitus with diabetic peripheral angiopathy without gangrene: Secondary | ICD-10-CM | POA: Diagnosis not present

## 2021-01-10 DIAGNOSIS — I10 Essential (primary) hypertension: Secondary | ICD-10-CM | POA: Diagnosis not present

## 2021-01-10 DIAGNOSIS — I70201 Unspecified atherosclerosis of native arteries of extremities, right leg: Secondary | ICD-10-CM | POA: Diagnosis not present

## 2021-01-10 DIAGNOSIS — Z4781 Encounter for orthopedic aftercare following surgical amputation: Secondary | ICD-10-CM | POA: Diagnosis not present

## 2021-01-11 DIAGNOSIS — M109 Gout, unspecified: Secondary | ICD-10-CM | POA: Diagnosis not present

## 2021-01-11 DIAGNOSIS — I10 Essential (primary) hypertension: Secondary | ICD-10-CM | POA: Diagnosis not present

## 2021-01-11 DIAGNOSIS — E1151 Type 2 diabetes mellitus with diabetic peripheral angiopathy without gangrene: Secondary | ICD-10-CM | POA: Diagnosis not present

## 2021-01-11 DIAGNOSIS — J449 Chronic obstructive pulmonary disease, unspecified: Secondary | ICD-10-CM | POA: Diagnosis not present

## 2021-01-11 DIAGNOSIS — Z48812 Encounter for surgical aftercare following surgery on the circulatory system: Secondary | ICD-10-CM | POA: Diagnosis not present

## 2021-01-11 DIAGNOSIS — Z4781 Encounter for orthopedic aftercare following surgical amputation: Secondary | ICD-10-CM | POA: Diagnosis not present

## 2021-01-11 DIAGNOSIS — I70201 Unspecified atherosclerosis of native arteries of extremities, right leg: Secondary | ICD-10-CM | POA: Diagnosis not present

## 2021-01-11 DIAGNOSIS — M47816 Spondylosis without myelopathy or radiculopathy, lumbar region: Secondary | ICD-10-CM | POA: Diagnosis not present

## 2021-01-11 DIAGNOSIS — Z89421 Acquired absence of other right toe(s): Secondary | ICD-10-CM | POA: Diagnosis not present

## 2021-01-12 DIAGNOSIS — E1151 Type 2 diabetes mellitus with diabetic peripheral angiopathy without gangrene: Secondary | ICD-10-CM | POA: Diagnosis not present

## 2021-01-12 DIAGNOSIS — Z4781 Encounter for orthopedic aftercare following surgical amputation: Secondary | ICD-10-CM | POA: Diagnosis not present

## 2021-01-12 DIAGNOSIS — Z89421 Acquired absence of other right toe(s): Secondary | ICD-10-CM | POA: Diagnosis not present

## 2021-01-12 DIAGNOSIS — M109 Gout, unspecified: Secondary | ICD-10-CM | POA: Diagnosis not present

## 2021-01-12 DIAGNOSIS — J449 Chronic obstructive pulmonary disease, unspecified: Secondary | ICD-10-CM | POA: Diagnosis not present

## 2021-01-12 DIAGNOSIS — I10 Essential (primary) hypertension: Secondary | ICD-10-CM | POA: Diagnosis not present

## 2021-01-12 DIAGNOSIS — I70201 Unspecified atherosclerosis of native arteries of extremities, right leg: Secondary | ICD-10-CM | POA: Diagnosis not present

## 2021-01-12 DIAGNOSIS — M47816 Spondylosis without myelopathy or radiculopathy, lumbar region: Secondary | ICD-10-CM | POA: Diagnosis not present

## 2021-01-12 DIAGNOSIS — Z48812 Encounter for surgical aftercare following surgery on the circulatory system: Secondary | ICD-10-CM | POA: Diagnosis not present

## 2021-01-16 DIAGNOSIS — M47816 Spondylosis without myelopathy or radiculopathy, lumbar region: Secondary | ICD-10-CM | POA: Diagnosis not present

## 2021-01-16 DIAGNOSIS — M109 Gout, unspecified: Secondary | ICD-10-CM | POA: Diagnosis not present

## 2021-01-16 DIAGNOSIS — J449 Chronic obstructive pulmonary disease, unspecified: Secondary | ICD-10-CM | POA: Diagnosis not present

## 2021-01-16 DIAGNOSIS — I70201 Unspecified atherosclerosis of native arteries of extremities, right leg: Secondary | ICD-10-CM | POA: Diagnosis not present

## 2021-01-16 DIAGNOSIS — Z48812 Encounter for surgical aftercare following surgery on the circulatory system: Secondary | ICD-10-CM | POA: Diagnosis not present

## 2021-01-16 DIAGNOSIS — I10 Essential (primary) hypertension: Secondary | ICD-10-CM | POA: Diagnosis not present

## 2021-01-16 DIAGNOSIS — E1151 Type 2 diabetes mellitus with diabetic peripheral angiopathy without gangrene: Secondary | ICD-10-CM | POA: Diagnosis not present

## 2021-01-16 DIAGNOSIS — Z89421 Acquired absence of other right toe(s): Secondary | ICD-10-CM | POA: Diagnosis not present

## 2021-01-16 DIAGNOSIS — Z4781 Encounter for orthopedic aftercare following surgical amputation: Secondary | ICD-10-CM | POA: Diagnosis not present

## 2021-01-17 DIAGNOSIS — Z89421 Acquired absence of other right toe(s): Secondary | ICD-10-CM | POA: Diagnosis not present

## 2021-01-17 DIAGNOSIS — E1151 Type 2 diabetes mellitus with diabetic peripheral angiopathy without gangrene: Secondary | ICD-10-CM | POA: Diagnosis not present

## 2021-01-17 DIAGNOSIS — Z48812 Encounter for surgical aftercare following surgery on the circulatory system: Secondary | ICD-10-CM | POA: Diagnosis not present

## 2021-01-17 DIAGNOSIS — M109 Gout, unspecified: Secondary | ICD-10-CM | POA: Diagnosis not present

## 2021-01-17 DIAGNOSIS — M47816 Spondylosis without myelopathy or radiculopathy, lumbar region: Secondary | ICD-10-CM | POA: Diagnosis not present

## 2021-01-17 DIAGNOSIS — J449 Chronic obstructive pulmonary disease, unspecified: Secondary | ICD-10-CM | POA: Diagnosis not present

## 2021-01-17 DIAGNOSIS — I10 Essential (primary) hypertension: Secondary | ICD-10-CM | POA: Diagnosis not present

## 2021-01-17 DIAGNOSIS — I70201 Unspecified atherosclerosis of native arteries of extremities, right leg: Secondary | ICD-10-CM | POA: Diagnosis not present

## 2021-01-17 DIAGNOSIS — Z4781 Encounter for orthopedic aftercare following surgical amputation: Secondary | ICD-10-CM | POA: Diagnosis not present

## 2021-01-18 ENCOUNTER — Encounter: Payer: Self-pay | Admitting: Vascular Surgery

## 2021-01-18 ENCOUNTER — Other Ambulatory Visit: Payer: Self-pay

## 2021-01-18 ENCOUNTER — Ambulatory Visit (INDEPENDENT_AMBULATORY_CARE_PROVIDER_SITE_OTHER): Payer: Medicare HMO | Admitting: Vascular Surgery

## 2021-01-18 VITALS — BP 143/85 | HR 92 | Temp 98.4°F | Resp 20 | Ht 68.0 in | Wt 135.0 lb

## 2021-01-18 DIAGNOSIS — I70261 Atherosclerosis of native arteries of extremities with gangrene, right leg: Secondary | ICD-10-CM

## 2021-01-18 NOTE — Progress Notes (Deleted)
Cardiology Office Note  Date: 01/18/2021   ID: Raymond [name], DOB 1960/05/12, MRN [id number]  PCP:  [name], MD  Cardiologist:  Carlyle Dolly, MD Electrophysiologist:  None   Chief Complaint: 67-month follow-up  History of Present Illness: Raymond [name] is a 60 y.o. male with a history of PVD, occlusion of right iliac artery, COPD, chronic respiratory failure, DM2, CVA, GERD, HLD, tobacco abuse, spinal stenosis, chronic pain syndrome.  He was last seen by Dr. Harl Bowie on 06/19/2020 with complaints of feeling prominent heartbeats.  Symptoms would come and go.  Could sometimes have chest heaviness with episodes.  Previous Holter monitor in January 2022 showed rare ectopy.  Isolated 7-second run of SVT.  Occasional ongoing palpitations.  Frequent lightheadedness/dizziness.  Staying well-hydrated.  Recent carotid ultrasound from PCP showed signs of subclavian steal.  He was on gabapentin, Percocet, Flomax which could all could contribute to dizziness.  He had been seen by vascular and had positive left subclavian stenosis but no signs of vertebral steal.  He was started on Lopressor 12.5 mg p.o. twice daily for palpitations.  Plans were to monitor palpitations.  History of multivessel peripheral vascular disease and right lower extremity with critical limb ischemia secondary to right common iliac artery thrombosis with extension into the internal and external iliac arteries and distal emboli. He recently had right common femoral artery endarterectomy with vein patch angioplasty, harvesting of left great saphenous vein, angioplasty and stenting of right common iliac artery and external iliac artery with 8 mm x 79 mm VBX stent.  Right femoral to below-knee popliteal artery bypass with 6 mm ringed PTFE graft.  Ray amputation of right fifth toe by Dr. [name] on 12/08/2020  Past Medical History:  Diagnosis Date   Anxiety    Back pain    Bipolar disorder ([place])    Borderline  hypertension    Chronic pain    COPD (chronic obstructive pulmonary disease) ([place])    Depression with anxiety    GERD (gastroesophageal reflux disease)    Headache    Hip pain    Hypercholesterolemia    Stroke [facility])    Tobacco use     Past Surgical History:  Procedure Laterality Date   AMPUTATION Right 12/08/2020   Procedure: RAY AMPUTATION OF RIGHT FIFTH TOE;  Surgeon: Angelia Mould, MD;  Location: [place];  Service: Vascular;  Laterality: Right;   ANGIOPLASTY ILLIAC ARTERY Right 12/08/2020   Procedure: ILIAC ANGIOPLASTY WITH INSERTION OF RIGHT COMMON ILIAC 8MM X 79MM VIABAHN STENT;  Surgeon: Angelia Mould, MD;  Location: [place];  Service: Vascular;  Laterality: Right;   ANTERIOR FUSION CERVICAL SPINE     BACK SURGERY     CHOLECYSTECTOMY     COLONOSCOPY N/A 10/28/2012   JGO:TLXBWI polyp-hyperplastic. Internal hemorrhoids. Distal 5 cm of terminal ileum appeared normal.   CORONARY ANGIOPLASTY WITH STENT PLACEMENT  1980s   ENDARTERECTOMY FEMORAL Right 12/08/2020   Procedure: RIGHT FEMORAL ENDARTERECTOMY WITH ILIOFEMORAL THROMBECTOMY;  Surgeon: Angelia Mould, MD;  Location: [place];  Service: Vascular;  Laterality: Right;   ESOPHAGOGASTRODUODENOSCOPY N/A 10/28/2012   OMB:TDHRCBUL gastric mucosa with mottling and submucosal petechiae. Mild chronic gastritis but no H. pylori home path. Small hiatal hernia. Duodenal lipoma   EYE SURGERY  Left eye   as a child   FEMORAL-POPLITEAL BYPASS GRAFT Right 12/08/2020   Procedure: RIGHT FEMORAL-BELOW KNEE POPLITEAL ARTERY BYPASS GRAFT;  Surgeon: Angelia Mould, MD;  Location: [place];  Service:  Vascular;  Laterality: Right;   LUMBAR SPINE SURGERY     3 x     Current Outpatient Medications  Medication Sig Dispense Refill   albuterol (ACCUNEB) 0.63 MG/3ML nebulizer solution Take 1 ampule by nebulization every 6 (six) hours as needed for wheezing or shortness of breath.     albuterol (PROVENTIL HFA;VENTOLIN HFA) 108 (90  BASE) MCG/ACT inhaler Inhale 2 puffs into the lungs every 6 (six) hours as needed for wheezing.     allopurinol (ZYLOPRIM) 100 MG tablet Take 100 mg by mouth in the morning.     aspirin 81 MG tablet Take 81 mg by mouth daily.     buPROPion (WELLBUTRIN XL) 150 MG 24 hr tablet Take 1 tablet by mouth daily at 6 (six) AM.     calcium carbonate (TUMS EX) 750 MG chewable tablet Chew 2 tablets by mouth as needed for heartburn.     clopidogrel (PLAVIX) 75 MG tablet Take 1 tablet (75 mg total) by mouth daily at 6 (six) AM. 30 tablet 2   diclofenac (VOLTAREN) 50 MG EC tablet Take 50 mg by mouth 3 (three) times daily.     gabapentin (NEURONTIN) 300 MG capsule Take 300 mg by mouth 3 (three) times daily.     ibuprofen (ADVIL) 600 MG tablet Take 600 mg by mouth every 6 (six) hours as needed for moderate pain or mild pain.     Magnesium Hydroxide (DULCOLAX SOFT CHEWS) 1200 MG CHEW Chew 1 tablet by mouth as needed (constipation).     magnesium oxide (MAG-OX) 400 MG tablet Take 1 tablet by mouth daily.     metFORMIN (GLUCOPHAGE) 500 MG tablet Take 2 tablets by mouth 2 (two) times daily.     metoprolol tartrate (LOPRESSOR) 25 MG tablet TAKE 1/2 TABLET BY MOUTH TWICE DAILY (Patient taking differently: Take 12.5 mg by mouth 2 (two) times daily.) 90 tablet 0   omeprazole (PRILOSEC) 40 MG capsule Take 1 capsule by mouth daily at 6 (six) AM.     ondansetron (ZOFRAN) 4 MG tablet Take 4 mg by mouth 3 (three) times daily as needed.     oxyCODONE-acetaminophen (PERCOCET) 10-325 MG per tablet Take 1-2 tablets by mouth 4 (four) times daily as needed for pain. max FIVE PER DAY     rosuvastatin (CRESTOR) 10 MG tablet Take 2 tablets (20 mg total) by mouth at bedtime.     silver sulfADIAZINE (SILVADENE) 1 % cream Apply 1 application topically 2 (two) times daily. apply a 1/16 inch (1.5 mm) thick layer to entire burn area     sulfamethoxazole-trimethoprim (BACTRIM DS) 800-160 MG tablet Take 1 tablet by mouth 2 (two) times daily. 12  tablet 0   tamsulosin (FLOMAX) 0.4 MG CAPS capsule Take 0.4 mg by mouth in the morning.     No current facility-administered medications for this visit.   Allergies:  Morphine and related, Penicillins, Vicodin [hydrocodone-acetaminophen], and Latex   Social History: The patient  reports that he has been smoking cigarettes. He has a 60.00 pack-year smoking history. He has never used smokeless tobacco. He reports that he does not drink alcohol and does not use drugs.   Family History: The patient's family history includes Stomach cancer in his mother; Throat cancer in his father.   ROS:  Please see the history of present illness. Otherwise, complete review of systems is positive for none.  All other systems are reviewed and negative.   Physical Exam: VS:  There were no vitals taken for  this visit., BMI There is no height or weight on file to calculate BMI.  Wt Readings from Last 3 Encounters:  12/13/20 135 lb 9.3 oz (61.5 kg)  08/02/20 154 lb 6.4 oz (70 kg)  07/14/20 164 lb (74.4 kg)    General: Patient appears comfortable at rest. HEENT: Conjunctiva and lids normal, oropharynx clear with moist mucosa. Neck: Supple, no elevated JVP or carotid bruits, no thyromegaly. Lungs: Clear to auscultation, nonlabored breathing at rest. Cardiac: Regular rate and rhythm, no S3 or significant systolic murmur, no pericardial rub. Abdomen: Soft, nontender, no hepatomegaly, bowel sounds present, no guarding or rebound. Extremities: No pitting edema, distal pulses 2+. Skin: Warm and dry. Musculoskeletal: No kyphosis. Neuropsychiatric: Alert and oriented x3, affect grossly appropriate.  ECG:  {EKG/Telemetry Strips [phone]}  Recent Labwork: 12/07/2020: ALT 13; AST 13 12/12/2020: BUN 10; Creatinine, Ser 0.91; Hemoglobin 9.4; Magnesium 2.0; Platelets 474; Potassium 4.1; Sodium 136     Component Value Date/Time   CHOL 83 12/09/2020 0335   TRIG 85 12/09/2020 0335   HDL 35 (L) 12/09/2020 0335    CHOLHDL 2.4 12/09/2020 0335   VLDL 17 12/09/2020 0335   LDLCALC 31 12/09/2020 0335    Other Studies Reviewed Today:  12/2019 carotid US IMPRESSION: 1. Retrograde flow within the left vertebral artery with associated asymmetrically decreased blood pressure within the left upper extremity - constellation of findings could be seen in the setting of subclavian steal syndrome. Clinical correlation is advised. Further evaluation with CTA could be performed as indicated. 2. Minimal amount of left-sided atherosclerotic plaque, not resulting in a hemodynamically significant stenosis. 3. Normal sonographic evaluation of the right carotid system.   Jan 2022 holter No symptoms reported Overall rare supraventricular and ventricular ectopy, isolated run of SVT up to 7.3 seconds      Assessment and Plan:  1. Heart palpitations   2. Peripheral vascular disease of lower extremity with ulceration ([place])   3. Occlusion of right iliac artery ([facility])      Medication Adjustments/Labs and Tests Ordered: Current medicines are reviewed at length with the patient today.  Concerns regarding medicines are outlined above.   Disposition: Follow-up with ***  Signed, Levell July, NP 01/18/2021 1:22 PM    [facility] at [facility] [place], [place], [place] [address] Phone: [phone]; Fax: [phone]

## 2021-01-18 NOTE — Progress Notes (Signed)
Patient name: Raymond [name] MRN: [id number] DOB: Oct 28, 1960 Sex: male  REASON FOR VISIT:   Follow-up after right lower extremity revascularization for critical limb ischemia  HPI:   Raymond [name] is a pleasant 60 y.o. male who presented with gangrene of the right lateral foot.  He had evidence of a right common iliac artery occlusion and also occlusion of the common femoral artery.  He was taken to the operating room on 12/08/2020 and underwent a right common femoral artery endarterectomy with vein patch angioplasty.  Vein was taken from the left leg and I used the great saphenous vein.  He then had angioplasty and stenting of the right common iliac artery and external iliac artery with an 8 mm x 79 mm VBX stent.  He subsequently had a right femoral to below-knee popliteal artery bypass with a 6 mm PTFE graft.  In addition he had ray amputation of the right fifth toe and placement of a VAC.  When I last saw him in the hospital he had a palpable right dorsalis pedis pulse.  The toe amputation site looks good as documented in the photograph on 12/13/2020.  The VAC was being changed 3 times a week.  He missed his initial follow-up appointment because he was sick.  He comes in today for his first outpatient follow-up visit.  Apparently his insurance did not approve the [facility] and therefore he has been simply having the dressing changed at home by family member.  He continues to smoke but is cut back to 1-1/2 packs/day.  Current Outpatient Medications  Medication Sig Dispense Refill   albuterol (ACCUNEB) 0.63 MG/3ML nebulizer solution Take 1 ampule by nebulization every 6 (six) hours as needed for wheezing or shortness of breath.     albuterol (PROVENTIL HFA;VENTOLIN HFA) 108 (90 BASE) MCG/ACT inhaler Inhale 2 puffs into the lungs every 6 (six) hours as needed for wheezing.     allopurinol (ZYLOPRIM) 100 MG tablet Take 100 mg by mouth in the morning.     aspirin 81 MG tablet Take 81 mg by mouth daily.      buPROPion (WELLBUTRIN XL) 150 MG 24 hr tablet Take 1 tablet by mouth daily at 6 (six) AM.     calcium carbonate (TUMS EX) 750 MG chewable tablet Chew 2 tablets by mouth as needed for heartburn.     clopidogrel (PLAVIX) 75 MG tablet Take 1 tablet (75 mg total) by mouth daily at 6 (six) AM. 30 tablet 2   diclofenac (VOLTAREN) 50 MG EC tablet Take 50 mg by mouth 3 (three) times daily.     gabapentin (NEURONTIN) 300 MG capsule Take 300 mg by mouth 3 (three) times daily.     ibuprofen (ADVIL) 600 MG tablet Take 600 mg by mouth every 6 (six) hours as needed for moderate pain or mild pain.     Magnesium Hydroxide (DULCOLAX SOFT CHEWS) 1200 MG CHEW Chew 1 tablet by mouth as needed (constipation).     magnesium oxide (MAG-OX) 400 MG tablet Take 1 tablet by mouth daily.     metFORMIN (GLUCOPHAGE) 500 MG tablet Take 2 tablets by mouth 2 (two) times daily.     metoprolol tartrate (LOPRESSOR) 25 MG tablet TAKE 1/2 TABLET BY MOUTH TWICE DAILY (Patient taking differently: Take 12.5 mg by mouth 2 (two) times daily.) 90 tablet 0   omeprazole (PRILOSEC) 40 MG capsule Take 1 capsule by mouth daily at 6 (six) AM.     ondansetron (ZOFRAN) 4 MG tablet  Take 4 mg by mouth 3 (three) times daily as needed.     oxyCODONE-acetaminophen (PERCOCET) 10-325 MG per tablet Take 1-2 tablets by mouth 4 (four) times daily as needed for pain. max FIVE PER DAY     rosuvastatin (CRESTOR) 10 MG tablet Take 2 tablets (20 mg total) by mouth at bedtime.     silver sulfADIAZINE (SILVADENE) 1 % cream Apply 1 application topically 2 (two) times daily. apply a 1/16 inch (1.5 mm) thick layer to entire burn area     sulfamethoxazole-trimethoprim (BACTRIM DS) 800-160 MG tablet Take 1 tablet by mouth 2 (two) times daily. 12 tablet 0   tamsulosin (FLOMAX) 0.4 MG CAPS capsule Take 0.4 mg by mouth in the morning.     No current facility-administered medications for this visit.    REVIEW OF SYSTEMS:  [X]  denotes positive finding, [ ]  denotes  negative finding Vascular    Leg swelling    Cardiac    Chest pain or chest pressure:    Shortness of breath upon exertion:    Short of breath when lying flat:    Irregular heart rhythm:    Constitutional    Fever or chills:     PHYSICAL EXAM:   Vitals:   01/18/21 1546  BP: (!) 143/85  Pulse: 92  Resp: 20  Temp: 98.4 F (36.9 C)  SpO2: 94%  Weight: 135 lb (61.2 kg)  Height: 5\' 8"  (1.727 m)    GENERAL: The patient is a well-nourished male, in no acute distress. The vital signs are documented above. CARDIOVASCULAR: There is a regular rate and rhythm. PULMONARY: There is good air exchange bilaterally without wheezing or rales. VASCULAR: He has a palpable dorsalis pedis pulse. The amputation site has reasonable granulation tissue.  I did some excisional debridement in the office today.  I remove the black eschar seen on the photograph below.  I also remove the 2 sutures. He has a small open wound in the right groin with an underlying lymphocele.  There is no evidence of infection.      DATA:   No new data  MEDICAL ISSUES:   CRITICAL LIMB ISCHEMIA: Patient has undergone extensive revascularization as documented above.  He has a palpable dorsalis pedis pulse.  The wound looks reasonable although I have instructed him to try his best to get off the tobacco completely as this would significantly improve his chance of healing.  We discussed how nicotine causes vasospasm of the microcirculation and the wound would look much better if he could get off the cigarettes.  I have instructed the family on how to do the dressing changes with hydrogel and wet-to-dry dressings.  I will see him back on my schedule in 3 to 4 weeks.  He knows to call sooner if he has problems.  [name] [facility] [phone]

## 2021-01-19 ENCOUNTER — Ambulatory Visit: Payer: Medicare HMO | Admitting: Family Medicine

## 2021-01-19 DIAGNOSIS — J449 Chronic obstructive pulmonary disease, unspecified: Secondary | ICD-10-CM | POA: Diagnosis not present

## 2021-01-19 DIAGNOSIS — R002 Palpitations: Secondary | ICD-10-CM

## 2021-01-19 DIAGNOSIS — I70201 Unspecified atherosclerosis of native arteries of extremities, right leg: Secondary | ICD-10-CM | POA: Diagnosis not present

## 2021-01-19 DIAGNOSIS — I745 Embolism and thrombosis of iliac artery: Secondary | ICD-10-CM

## 2021-01-19 DIAGNOSIS — Z89421 Acquired absence of other right toe(s): Secondary | ICD-10-CM | POA: Diagnosis not present

## 2021-01-19 DIAGNOSIS — I10 Essential (primary) hypertension: Secondary | ICD-10-CM | POA: Diagnosis not present

## 2021-01-19 DIAGNOSIS — Z48812 Encounter for surgical aftercare following surgery on the circulatory system: Secondary | ICD-10-CM | POA: Diagnosis not present

## 2021-01-19 DIAGNOSIS — M47816 Spondylosis without myelopathy or radiculopathy, lumbar region: Secondary | ICD-10-CM | POA: Diagnosis not present

## 2021-01-19 DIAGNOSIS — E1151 Type 2 diabetes mellitus with diabetic peripheral angiopathy without gangrene: Secondary | ICD-10-CM | POA: Diagnosis not present

## 2021-01-19 DIAGNOSIS — M109 Gout, unspecified: Secondary | ICD-10-CM | POA: Diagnosis not present

## 2021-01-19 DIAGNOSIS — Z4781 Encounter for orthopedic aftercare following surgical amputation: Secondary | ICD-10-CM | POA: Diagnosis not present

## 2021-01-19 DIAGNOSIS — I739 Peripheral vascular disease, unspecified: Secondary | ICD-10-CM

## 2021-01-23 DIAGNOSIS — M109 Gout, unspecified: Secondary | ICD-10-CM | POA: Diagnosis not present

## 2021-01-23 DIAGNOSIS — Z89421 Acquired absence of other right toe(s): Secondary | ICD-10-CM | POA: Diagnosis not present

## 2021-01-23 DIAGNOSIS — I70201 Unspecified atherosclerosis of native arteries of extremities, right leg: Secondary | ICD-10-CM | POA: Diagnosis not present

## 2021-01-23 DIAGNOSIS — J449 Chronic obstructive pulmonary disease, unspecified: Secondary | ICD-10-CM | POA: Diagnosis not present

## 2021-01-23 DIAGNOSIS — I10 Essential (primary) hypertension: Secondary | ICD-10-CM | POA: Diagnosis not present

## 2021-01-23 DIAGNOSIS — M47816 Spondylosis without myelopathy or radiculopathy, lumbar region: Secondary | ICD-10-CM | POA: Diagnosis not present

## 2021-01-23 DIAGNOSIS — E1151 Type 2 diabetes mellitus with diabetic peripheral angiopathy without gangrene: Secondary | ICD-10-CM | POA: Diagnosis not present

## 2021-01-23 DIAGNOSIS — Z48812 Encounter for surgical aftercare following surgery on the circulatory system: Secondary | ICD-10-CM | POA: Diagnosis not present

## 2021-01-23 DIAGNOSIS — Z4781 Encounter for orthopedic aftercare following surgical amputation: Secondary | ICD-10-CM | POA: Diagnosis not present

## 2021-01-24 DIAGNOSIS — M47816 Spondylosis without myelopathy or radiculopathy, lumbar region: Secondary | ICD-10-CM | POA: Diagnosis not present

## 2021-01-24 DIAGNOSIS — M109 Gout, unspecified: Secondary | ICD-10-CM | POA: Diagnosis not present

## 2021-01-24 DIAGNOSIS — J449 Chronic obstructive pulmonary disease, unspecified: Secondary | ICD-10-CM | POA: Diagnosis not present

## 2021-01-24 DIAGNOSIS — E1151 Type 2 diabetes mellitus with diabetic peripheral angiopathy without gangrene: Secondary | ICD-10-CM | POA: Diagnosis not present

## 2021-01-24 DIAGNOSIS — I10 Essential (primary) hypertension: Secondary | ICD-10-CM | POA: Diagnosis not present

## 2021-01-24 DIAGNOSIS — Z48812 Encounter for surgical aftercare following surgery on the circulatory system: Secondary | ICD-10-CM | POA: Diagnosis not present

## 2021-01-24 DIAGNOSIS — I70201 Unspecified atherosclerosis of native arteries of extremities, right leg: Secondary | ICD-10-CM | POA: Diagnosis not present

## 2021-01-24 DIAGNOSIS — Z4781 Encounter for orthopedic aftercare following surgical amputation: Secondary | ICD-10-CM | POA: Diagnosis not present

## 2021-01-24 DIAGNOSIS — Z89421 Acquired absence of other right toe(s): Secondary | ICD-10-CM | POA: Diagnosis not present

## 2021-01-25 DIAGNOSIS — Z4781 Encounter for orthopedic aftercare following surgical amputation: Secondary | ICD-10-CM | POA: Diagnosis not present

## 2021-01-25 DIAGNOSIS — Z48812 Encounter for surgical aftercare following surgery on the circulatory system: Secondary | ICD-10-CM | POA: Diagnosis not present

## 2021-01-25 DIAGNOSIS — I70201 Unspecified atherosclerosis of native arteries of extremities, right leg: Secondary | ICD-10-CM | POA: Diagnosis not present

## 2021-01-25 DIAGNOSIS — M109 Gout, unspecified: Secondary | ICD-10-CM | POA: Diagnosis not present

## 2021-01-25 DIAGNOSIS — I10 Essential (primary) hypertension: Secondary | ICD-10-CM | POA: Diagnosis not present

## 2021-01-25 DIAGNOSIS — E1151 Type 2 diabetes mellitus with diabetic peripheral angiopathy without gangrene: Secondary | ICD-10-CM | POA: Diagnosis not present

## 2021-01-25 DIAGNOSIS — M47816 Spondylosis without myelopathy or radiculopathy, lumbar region: Secondary | ICD-10-CM | POA: Diagnosis not present

## 2021-01-25 DIAGNOSIS — Z89421 Acquired absence of other right toe(s): Secondary | ICD-10-CM | POA: Diagnosis not present

## 2021-01-25 DIAGNOSIS — J449 Chronic obstructive pulmonary disease, unspecified: Secondary | ICD-10-CM | POA: Diagnosis not present

## 2021-01-26 DIAGNOSIS — Z89421 Acquired absence of other right toe(s): Secondary | ICD-10-CM | POA: Diagnosis not present

## 2021-01-26 DIAGNOSIS — I10 Essential (primary) hypertension: Secondary | ICD-10-CM | POA: Diagnosis not present

## 2021-01-26 DIAGNOSIS — J449 Chronic obstructive pulmonary disease, unspecified: Secondary | ICD-10-CM | POA: Diagnosis not present

## 2021-01-26 DIAGNOSIS — I70201 Unspecified atherosclerosis of native arteries of extremities, right leg: Secondary | ICD-10-CM | POA: Diagnosis not present

## 2021-01-26 DIAGNOSIS — Z4781 Encounter for orthopedic aftercare following surgical amputation: Secondary | ICD-10-CM | POA: Diagnosis not present

## 2021-01-26 DIAGNOSIS — E1151 Type 2 diabetes mellitus with diabetic peripheral angiopathy without gangrene: Secondary | ICD-10-CM | POA: Diagnosis not present

## 2021-01-26 DIAGNOSIS — Z48812 Encounter for surgical aftercare following surgery on the circulatory system: Secondary | ICD-10-CM | POA: Diagnosis not present

## 2021-01-26 DIAGNOSIS — M109 Gout, unspecified: Secondary | ICD-10-CM | POA: Diagnosis not present

## 2021-01-26 DIAGNOSIS — M47816 Spondylosis without myelopathy or radiculopathy, lumbar region: Secondary | ICD-10-CM | POA: Diagnosis not present

## 2021-01-29 DIAGNOSIS — M159 Polyosteoarthritis, unspecified: Secondary | ICD-10-CM | POA: Diagnosis not present

## 2021-01-29 DIAGNOSIS — G894 Chronic pain syndrome: Secondary | ICD-10-CM | POA: Diagnosis not present

## 2021-01-30 DIAGNOSIS — Z4781 Encounter for orthopedic aftercare following surgical amputation: Secondary | ICD-10-CM | POA: Diagnosis not present

## 2021-01-30 DIAGNOSIS — J449 Chronic obstructive pulmonary disease, unspecified: Secondary | ICD-10-CM | POA: Diagnosis not present

## 2021-01-30 DIAGNOSIS — M109 Gout, unspecified: Secondary | ICD-10-CM | POA: Diagnosis not present

## 2021-01-30 DIAGNOSIS — Z48812 Encounter for surgical aftercare following surgery on the circulatory system: Secondary | ICD-10-CM | POA: Diagnosis not present

## 2021-01-30 DIAGNOSIS — Z89421 Acquired absence of other right toe(s): Secondary | ICD-10-CM | POA: Diagnosis not present

## 2021-01-30 DIAGNOSIS — I70201 Unspecified atherosclerosis of native arteries of extremities, right leg: Secondary | ICD-10-CM | POA: Diagnosis not present

## 2021-01-30 DIAGNOSIS — I10 Essential (primary) hypertension: Secondary | ICD-10-CM | POA: Diagnosis not present

## 2021-01-30 DIAGNOSIS — M47816 Spondylosis without myelopathy or radiculopathy, lumbar region: Secondary | ICD-10-CM | POA: Diagnosis not present

## 2021-01-30 DIAGNOSIS — E1151 Type 2 diabetes mellitus with diabetic peripheral angiopathy without gangrene: Secondary | ICD-10-CM | POA: Diagnosis not present

## 2021-02-06 DIAGNOSIS — I70201 Unspecified atherosclerosis of native arteries of extremities, right leg: Secondary | ICD-10-CM | POA: Diagnosis not present

## 2021-02-06 DIAGNOSIS — Z48812 Encounter for surgical aftercare following surgery on the circulatory system: Secondary | ICD-10-CM | POA: Diagnosis not present

## 2021-02-06 DIAGNOSIS — Z89421 Acquired absence of other right toe(s): Secondary | ICD-10-CM | POA: Diagnosis not present

## 2021-02-06 DIAGNOSIS — J449 Chronic obstructive pulmonary disease, unspecified: Secondary | ICD-10-CM | POA: Diagnosis not present

## 2021-02-06 DIAGNOSIS — M47816 Spondylosis without myelopathy or radiculopathy, lumbar region: Secondary | ICD-10-CM | POA: Diagnosis not present

## 2021-02-06 DIAGNOSIS — M109 Gout, unspecified: Secondary | ICD-10-CM | POA: Diagnosis not present

## 2021-02-06 DIAGNOSIS — E1151 Type 2 diabetes mellitus with diabetic peripheral angiopathy without gangrene: Secondary | ICD-10-CM | POA: Diagnosis not present

## 2021-02-06 DIAGNOSIS — I10 Essential (primary) hypertension: Secondary | ICD-10-CM | POA: Diagnosis not present

## 2021-02-06 DIAGNOSIS — Z4781 Encounter for orthopedic aftercare following surgical amputation: Secondary | ICD-10-CM | POA: Diagnosis not present

## 2021-02-07 DIAGNOSIS — I10 Essential (primary) hypertension: Secondary | ICD-10-CM | POA: Diagnosis not present

## 2021-02-07 DIAGNOSIS — E1165 Type 2 diabetes mellitus with hyperglycemia: Secondary | ICD-10-CM | POA: Diagnosis not present

## 2021-02-09 DIAGNOSIS — I70201 Unspecified atherosclerosis of native arteries of extremities, right leg: Secondary | ICD-10-CM | POA: Diagnosis not present

## 2021-02-09 DIAGNOSIS — M109 Gout, unspecified: Secondary | ICD-10-CM | POA: Diagnosis not present

## 2021-02-09 DIAGNOSIS — J449 Chronic obstructive pulmonary disease, unspecified: Secondary | ICD-10-CM | POA: Diagnosis not present

## 2021-02-09 DIAGNOSIS — Z89421 Acquired absence of other right toe(s): Secondary | ICD-10-CM | POA: Diagnosis not present

## 2021-02-09 DIAGNOSIS — E1151 Type 2 diabetes mellitus with diabetic peripheral angiopathy without gangrene: Secondary | ICD-10-CM | POA: Diagnosis not present

## 2021-02-09 DIAGNOSIS — Z48812 Encounter for surgical aftercare following surgery on the circulatory system: Secondary | ICD-10-CM | POA: Diagnosis not present

## 2021-02-09 DIAGNOSIS — I10 Essential (primary) hypertension: Secondary | ICD-10-CM | POA: Diagnosis not present

## 2021-02-09 DIAGNOSIS — M47816 Spondylosis without myelopathy or radiculopathy, lumbar region: Secondary | ICD-10-CM | POA: Diagnosis not present

## 2021-02-09 DIAGNOSIS — Z4781 Encounter for orthopedic aftercare following surgical amputation: Secondary | ICD-10-CM | POA: Diagnosis not present

## 2021-02-13 DIAGNOSIS — M109 Gout, unspecified: Secondary | ICD-10-CM | POA: Diagnosis not present

## 2021-02-13 DIAGNOSIS — E1151 Type 2 diabetes mellitus with diabetic peripheral angiopathy without gangrene: Secondary | ICD-10-CM | POA: Diagnosis not present

## 2021-02-13 DIAGNOSIS — Z4781 Encounter for orthopedic aftercare following surgical amputation: Secondary | ICD-10-CM | POA: Diagnosis not present

## 2021-02-13 DIAGNOSIS — J449 Chronic obstructive pulmonary disease, unspecified: Secondary | ICD-10-CM | POA: Diagnosis not present

## 2021-02-13 DIAGNOSIS — L89612 Pressure ulcer of right heel, stage 2: Secondary | ICD-10-CM | POA: Diagnosis not present

## 2021-02-13 DIAGNOSIS — Z48812 Encounter for surgical aftercare following surgery on the circulatory system: Secondary | ICD-10-CM | POA: Diagnosis not present

## 2021-02-13 DIAGNOSIS — Z4801 Encounter for change or removal of surgical wound dressing: Secondary | ICD-10-CM | POA: Diagnosis not present

## 2021-02-13 DIAGNOSIS — I10 Essential (primary) hypertension: Secondary | ICD-10-CM | POA: Diagnosis not present

## 2021-02-13 DIAGNOSIS — I70201 Unspecified atherosclerosis of native arteries of extremities, right leg: Secondary | ICD-10-CM | POA: Diagnosis not present

## 2021-02-15 ENCOUNTER — Other Ambulatory Visit: Payer: Self-pay

## 2021-02-15 ENCOUNTER — Encounter: Payer: Self-pay | Admitting: Vascular Surgery

## 2021-02-15 ENCOUNTER — Ambulatory Visit: Payer: Medicare HMO | Admitting: Vascular Surgery

## 2021-02-15 VITALS — BP 117/76 | HR 92 | Temp 98.3°F | Resp 20 | Ht 68.0 in | Wt 135.0 lb

## 2021-02-15 DIAGNOSIS — Z48812 Encounter for surgical aftercare following surgery on the circulatory system: Secondary | ICD-10-CM

## 2021-02-15 DIAGNOSIS — I70261 Atherosclerosis of native arteries of extremities with gangrene, right leg: Secondary | ICD-10-CM

## 2021-02-15 NOTE — Progress Notes (Signed)
Patient name: Raymond [name] MRN: [id number] DOB: [birth year] Sex: male  REASON FOR VISIT:   Follow-up after right lower extremity revascularization  HPI:   Raymond [name] is a pleasant 60 y.o. male who presented with gangrene of the right lateral foot.  He had a right common iliac artery occlusion.  Based on his history I did not think this was acute.  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  Of note, there is a picture of his right foot wound in my note dated 12/06/2020.  His VAC was not approved and therefore he has been doing dressing changes with hydrogel to the right lateral foot wound.  The home health nurse comes twice a week and his daughter is doing an excellent job doing the dressings on the other days.  He does continue to smoke.  His activity is fairly limited and he spends a fair amount of time in the wheelchair.  Current Outpatient Medications  Medication Sig Dispense Refill   albuterol (ACCUNEB) 0.63 MG/3ML nebulizer solution Take 1 ampule by nebulization every 6 (six) hours as needed for wheezing or shortness of breath.     albuterol (PROVENTIL HFA;VENTOLIN HFA) 108 (90 BASE) MCG/ACT inhaler Inhale 2 puffs into the lungs every 6 (six) hours as needed for wheezing.     allopurinol (ZYLOPRIM) 100 MG tablet Take 100 mg by mouth in the morning.     aspirin 81 MG tablet Take 81 mg by mouth daily.     buPROPion (WELLBUTRIN XL) 150 MG 24 hr tablet Take 1 tablet by mouth daily at 6 (six) AM.     calcium carbonate (TUMS EX) 750 MG chewable tablet Chew 2 tablets by mouth as needed for heartburn.     clopidogrel (PLAVIX) 75 MG tablet Take 1 tablet (75 mg total) by mouth daily at 6 (six) AM. 30 tablet 2   diclofenac  (VOLTAREN) 50 MG EC tablet Take 50 mg by mouth 3 (three) times daily.     gabapentin (NEURONTIN) 300 MG capsule Take 300 mg by mouth 3 (three) times daily.     ibuprofen (ADVIL) 600 MG tablet Take 600 mg by mouth every 6 (six) hours as needed for moderate pain or mild pain.     Magnesium Hydroxide (DULCOLAX SOFT CHEWS) 1200 MG CHEW Chew 1 tablet by mouth as needed (constipation).     magnesium oxide (MAG-OX) 400 MG tablet Take 1 tablet by mouth daily.     metFORMIN (GLUCOPHAGE) 500 MG tablet Take 2 tablets by mouth 2 (two) times daily.     metoprolol tartrate (LOPRESSOR) 25 MG tablet TAKE 1/2 TABLET BY MOUTH TWICE DAILY (Patient taking differently: Take 12.5 mg by mouth 2 (two) times daily.) 90 tablet 0   omeprazole (PRILOSEC) 40 MG capsule Take 1 capsule by mouth daily at 6 (six) AM.     ondansetron (ZOFRAN) 4 MG tablet Take 4 mg by mouth 3 (three) times daily as needed.     oxyCODONE-acetaminophen (PERCOCET) 10-325 MG per tablet Take 1-2 tablets by mouth 4 (four) times daily as needed for pain. max FIVE PER DAY     rosuvastatin (CRESTOR) 10 MG tablet Take 2 tablets (20 mg total) by mouth at bedtime.  silver sulfADIAZINE (SILVADENE) 1 % cream Apply 1 application topically 2 (two) times daily. apply a 1/16 inch (1.5 mm) thick layer to entire burn area     sulfamethoxazole-trimethoprim (BACTRIM DS) 800-160 MG tablet Take 1 tablet by mouth 2 (two) times daily. 12 tablet 0   tamsulosin (FLOMAX) 0.4 MG CAPS capsule Take 0.4 mg by mouth in the morning.     No current facility-administered medications for this visit.    REVIEW OF SYSTEMS:  [X]  denotes positive finding, [ ]  denotes negative finding Vascular    Leg swelling    Cardiac    Chest pain or chest pressure:    Shortness of breath upon exertion:    Short of breath when lying flat:    Irregular heart rhythm:    Constitutional    Fever or chills:     PHYSICAL EXAM:   Vitals:   02/15/21 1521  BP: 117/76  Pulse: 92  Resp: 20   Temp: 98.3 F (36.8 C)  SpO2: 93%  Weight: 135 lb (61.2 kg)  Height: 5\' 8"  (1.727 m)    GENERAL: The patient is a well-nourished male, in no acute distress. The vital signs are documented above. CARDIOVASCULAR: There is a regular rate and rhythm. PULMONARY: There is good air exchange bilaterally without wheezing or rales. VASCULAR: He has a palpable right femoral pulse.  The incision is healing nicely with a small area of swelling at the superior aspect of the incision where I think he had a small lymphocele which is improving. He has a biphasic anterior tibial signal with the Doppler and a barely biphasic dorsalis pedis signal.  He has a barely biphasic posterior tibial signal with the Doppler.  The wound is improving and measures 7 cm in length by 3 cm in width.     DATA:   No new data  MEDICAL ISSUES:   S/P RIGHT COMMON AND EXTERNAL ILIAC ARTERY ANGIOPLASTY AND STENTING AND RIGHT FEMOROPOPLITEAL BYPASS: His bypass graft is patent.  His ray amputation site of the fifth toe is healing.  He has brisk Doppler signals in the right foot.  I have ordered follow-up ABIs and a duplex of his graft in 3 months.   We have again discussed the importance of tobacco cessation.  I have explained that the nicotine causes vasoconstriction of the microcirculation which is certainly interfering with healing.  Although he has excellent perfusion by Doppler he still is at risk for limb loss given his continued tobacco use and diabetes which certainly can interfere with healing.  [name] [facility] [phone]

## 2021-02-16 ENCOUNTER — Other Ambulatory Visit: Payer: Self-pay

## 2021-02-16 DIAGNOSIS — I70201 Unspecified atherosclerosis of native arteries of extremities, right leg: Secondary | ICD-10-CM | POA: Diagnosis not present

## 2021-02-16 DIAGNOSIS — L89612 Pressure ulcer of right heel, stage 2: Secondary | ICD-10-CM | POA: Diagnosis not present

## 2021-02-16 DIAGNOSIS — M109 Gout, unspecified: Secondary | ICD-10-CM | POA: Diagnosis not present

## 2021-02-16 DIAGNOSIS — I10 Essential (primary) hypertension: Secondary | ICD-10-CM | POA: Diagnosis not present

## 2021-02-16 DIAGNOSIS — J449 Chronic obstructive pulmonary disease, unspecified: Secondary | ICD-10-CM | POA: Diagnosis not present

## 2021-02-16 DIAGNOSIS — Z4781 Encounter for orthopedic aftercare following surgical amputation: Secondary | ICD-10-CM | POA: Diagnosis not present

## 2021-02-16 DIAGNOSIS — E1151 Type 2 diabetes mellitus with diabetic peripheral angiopathy without gangrene: Secondary | ICD-10-CM | POA: Diagnosis not present

## 2021-02-16 DIAGNOSIS — Z4801 Encounter for change or removal of surgical wound dressing: Secondary | ICD-10-CM | POA: Diagnosis not present

## 2021-02-16 DIAGNOSIS — Z48812 Encounter for surgical aftercare following surgery on the circulatory system: Secondary | ICD-10-CM | POA: Diagnosis not present

## 2021-02-16 DIAGNOSIS — I70261 Atherosclerosis of native arteries of extremities with gangrene, right leg: Secondary | ICD-10-CM

## 2021-02-20 ENCOUNTER — Other Ambulatory Visit: Payer: Self-pay | Admitting: Cardiology

## 2021-02-20 DIAGNOSIS — M109 Gout, unspecified: Secondary | ICD-10-CM | POA: Diagnosis not present

## 2021-02-20 DIAGNOSIS — Z48812 Encounter for surgical aftercare following surgery on the circulatory system: Secondary | ICD-10-CM | POA: Diagnosis not present

## 2021-02-20 DIAGNOSIS — I70201 Unspecified atherosclerosis of native arteries of extremities, right leg: Secondary | ICD-10-CM | POA: Diagnosis not present

## 2021-02-20 DIAGNOSIS — Z4801 Encounter for change or removal of surgical wound dressing: Secondary | ICD-10-CM | POA: Diagnosis not present

## 2021-02-20 DIAGNOSIS — L89612 Pressure ulcer of right heel, stage 2: Secondary | ICD-10-CM | POA: Diagnosis not present

## 2021-02-20 DIAGNOSIS — Z4781 Encounter for orthopedic aftercare following surgical amputation: Secondary | ICD-10-CM | POA: Diagnosis not present

## 2021-02-20 DIAGNOSIS — J449 Chronic obstructive pulmonary disease, unspecified: Secondary | ICD-10-CM | POA: Diagnosis not present

## 2021-02-20 DIAGNOSIS — I10 Essential (primary) hypertension: Secondary | ICD-10-CM | POA: Diagnosis not present

## 2021-02-20 DIAGNOSIS — E1151 Type 2 diabetes mellitus with diabetic peripheral angiopathy without gangrene: Secondary | ICD-10-CM | POA: Diagnosis not present

## 2021-02-22 DIAGNOSIS — J449 Chronic obstructive pulmonary disease, unspecified: Secondary | ICD-10-CM | POA: Diagnosis not present

## 2021-02-22 DIAGNOSIS — Z4801 Encounter for change or removal of surgical wound dressing: Secondary | ICD-10-CM | POA: Diagnosis not present

## 2021-02-22 DIAGNOSIS — I10 Essential (primary) hypertension: Secondary | ICD-10-CM | POA: Diagnosis not present

## 2021-02-22 DIAGNOSIS — L89612 Pressure ulcer of right heel, stage 2: Secondary | ICD-10-CM | POA: Diagnosis not present

## 2021-02-22 DIAGNOSIS — Z48812 Encounter for surgical aftercare following surgery on the circulatory system: Secondary | ICD-10-CM | POA: Diagnosis not present

## 2021-02-22 DIAGNOSIS — Z4781 Encounter for orthopedic aftercare following surgical amputation: Secondary | ICD-10-CM | POA: Diagnosis not present

## 2021-02-22 DIAGNOSIS — I70201 Unspecified atherosclerosis of native arteries of extremities, right leg: Secondary | ICD-10-CM | POA: Diagnosis not present

## 2021-02-22 DIAGNOSIS — M109 Gout, unspecified: Secondary | ICD-10-CM | POA: Diagnosis not present

## 2021-02-22 DIAGNOSIS — E1151 Type 2 diabetes mellitus with diabetic peripheral angiopathy without gangrene: Secondary | ICD-10-CM | POA: Diagnosis not present

## 2021-02-26 DIAGNOSIS — Z4781 Encounter for orthopedic aftercare following surgical amputation: Secondary | ICD-10-CM | POA: Diagnosis not present

## 2021-02-26 DIAGNOSIS — E1151 Type 2 diabetes mellitus with diabetic peripheral angiopathy without gangrene: Secondary | ICD-10-CM | POA: Diagnosis not present

## 2021-02-26 DIAGNOSIS — Z48812 Encounter for surgical aftercare following surgery on the circulatory system: Secondary | ICD-10-CM | POA: Diagnosis not present

## 2021-02-26 DIAGNOSIS — I70201 Unspecified atherosclerosis of native arteries of extremities, right leg: Secondary | ICD-10-CM | POA: Diagnosis not present

## 2021-02-26 DIAGNOSIS — L89612 Pressure ulcer of right heel, stage 2: Secondary | ICD-10-CM | POA: Diagnosis not present

## 2021-02-26 DIAGNOSIS — M109 Gout, unspecified: Secondary | ICD-10-CM | POA: Diagnosis not present

## 2021-02-26 DIAGNOSIS — Z4801 Encounter for change or removal of surgical wound dressing: Secondary | ICD-10-CM | POA: Diagnosis not present

## 2021-02-26 DIAGNOSIS — I10 Essential (primary) hypertension: Secondary | ICD-10-CM | POA: Diagnosis not present

## 2021-02-26 DIAGNOSIS — J449 Chronic obstructive pulmonary disease, unspecified: Secondary | ICD-10-CM | POA: Diagnosis not present

## 2021-03-01 DIAGNOSIS — Z4781 Encounter for orthopedic aftercare following surgical amputation: Secondary | ICD-10-CM | POA: Diagnosis not present

## 2021-03-01 DIAGNOSIS — E1151 Type 2 diabetes mellitus with diabetic peripheral angiopathy without gangrene: Secondary | ICD-10-CM | POA: Diagnosis not present

## 2021-03-01 DIAGNOSIS — I70201 Unspecified atherosclerosis of native arteries of extremities, right leg: Secondary | ICD-10-CM | POA: Diagnosis not present

## 2021-03-01 DIAGNOSIS — L89612 Pressure ulcer of right heel, stage 2: Secondary | ICD-10-CM | POA: Diagnosis not present

## 2021-03-01 DIAGNOSIS — M109 Gout, unspecified: Secondary | ICD-10-CM | POA: Diagnosis not present

## 2021-03-01 DIAGNOSIS — J449 Chronic obstructive pulmonary disease, unspecified: Secondary | ICD-10-CM | POA: Diagnosis not present

## 2021-03-01 DIAGNOSIS — Z4801 Encounter for change or removal of surgical wound dressing: Secondary | ICD-10-CM | POA: Diagnosis not present

## 2021-03-01 DIAGNOSIS — I10 Essential (primary) hypertension: Secondary | ICD-10-CM | POA: Diagnosis not present

## 2021-03-01 DIAGNOSIS — Z48812 Encounter for surgical aftercare following surgery on the circulatory system: Secondary | ICD-10-CM | POA: Diagnosis not present

## 2021-03-05 DIAGNOSIS — E1151 Type 2 diabetes mellitus with diabetic peripheral angiopathy without gangrene: Secondary | ICD-10-CM | POA: Diagnosis not present

## 2021-03-05 DIAGNOSIS — I70201 Unspecified atherosclerosis of native arteries of extremities, right leg: Secondary | ICD-10-CM | POA: Diagnosis not present

## 2021-03-05 DIAGNOSIS — Z48812 Encounter for surgical aftercare following surgery on the circulatory system: Secondary | ICD-10-CM | POA: Diagnosis not present

## 2021-03-05 DIAGNOSIS — L89612 Pressure ulcer of right heel, stage 2: Secondary | ICD-10-CM | POA: Diagnosis not present

## 2021-03-05 DIAGNOSIS — M109 Gout, unspecified: Secondary | ICD-10-CM | POA: Diagnosis not present

## 2021-03-05 DIAGNOSIS — I10 Essential (primary) hypertension: Secondary | ICD-10-CM | POA: Diagnosis not present

## 2021-03-05 DIAGNOSIS — J449 Chronic obstructive pulmonary disease, unspecified: Secondary | ICD-10-CM | POA: Diagnosis not present

## 2021-03-05 DIAGNOSIS — Z4801 Encounter for change or removal of surgical wound dressing: Secondary | ICD-10-CM | POA: Diagnosis not present

## 2021-03-05 DIAGNOSIS — Z4781 Encounter for orthopedic aftercare following surgical amputation: Secondary | ICD-10-CM | POA: Diagnosis not present

## 2021-03-06 DIAGNOSIS — M1991 Primary osteoarthritis, unspecified site: Secondary | ICD-10-CM | POA: Diagnosis not present

## 2021-03-06 DIAGNOSIS — G894 Chronic pain syndrome: Secondary | ICD-10-CM | POA: Diagnosis not present

## 2021-03-06 DIAGNOSIS — M5030 Other cervical disc degeneration, unspecified cervical region: Secondary | ICD-10-CM | POA: Diagnosis not present

## 2021-03-06 DIAGNOSIS — M159 Polyosteoarthritis, unspecified: Secondary | ICD-10-CM | POA: Diagnosis not present

## 2021-03-08 DIAGNOSIS — J449 Chronic obstructive pulmonary disease, unspecified: Secondary | ICD-10-CM | POA: Diagnosis not present

## 2021-03-08 DIAGNOSIS — M109 Gout, unspecified: Secondary | ICD-10-CM | POA: Diagnosis not present

## 2021-03-08 DIAGNOSIS — I70201 Unspecified atherosclerosis of native arteries of extremities, right leg: Secondary | ICD-10-CM | POA: Diagnosis not present

## 2021-03-08 DIAGNOSIS — E1151 Type 2 diabetes mellitus with diabetic peripheral angiopathy without gangrene: Secondary | ICD-10-CM | POA: Diagnosis not present

## 2021-03-08 DIAGNOSIS — Z48812 Encounter for surgical aftercare following surgery on the circulatory system: Secondary | ICD-10-CM | POA: Diagnosis not present

## 2021-03-08 DIAGNOSIS — Z4781 Encounter for orthopedic aftercare following surgical amputation: Secondary | ICD-10-CM | POA: Diagnosis not present

## 2021-03-08 DIAGNOSIS — I10 Essential (primary) hypertension: Secondary | ICD-10-CM | POA: Diagnosis not present

## 2021-03-08 DIAGNOSIS — L89612 Pressure ulcer of right heel, stage 2: Secondary | ICD-10-CM | POA: Diagnosis not present

## 2021-03-08 DIAGNOSIS — Z4801 Encounter for change or removal of surgical wound dressing: Secondary | ICD-10-CM | POA: Diagnosis not present

## 2021-03-09 DIAGNOSIS — I10 Essential (primary) hypertension: Secondary | ICD-10-CM | POA: Diagnosis not present

## 2021-03-09 DIAGNOSIS — E1165 Type 2 diabetes mellitus with hyperglycemia: Secondary | ICD-10-CM | POA: Diagnosis not present

## 2021-03-12 DIAGNOSIS — M109 Gout, unspecified: Secondary | ICD-10-CM | POA: Diagnosis not present

## 2021-03-12 DIAGNOSIS — J449 Chronic obstructive pulmonary disease, unspecified: Secondary | ICD-10-CM | POA: Diagnosis not present

## 2021-03-12 DIAGNOSIS — E1151 Type 2 diabetes mellitus with diabetic peripheral angiopathy without gangrene: Secondary | ICD-10-CM | POA: Diagnosis not present

## 2021-03-12 DIAGNOSIS — I70201 Unspecified atherosclerosis of native arteries of extremities, right leg: Secondary | ICD-10-CM | POA: Diagnosis not present

## 2021-03-12 DIAGNOSIS — L89612 Pressure ulcer of right heel, stage 2: Secondary | ICD-10-CM | POA: Diagnosis not present

## 2021-03-12 DIAGNOSIS — Z48812 Encounter for surgical aftercare following surgery on the circulatory system: Secondary | ICD-10-CM | POA: Diagnosis not present

## 2021-03-12 DIAGNOSIS — I10 Essential (primary) hypertension: Secondary | ICD-10-CM | POA: Diagnosis not present

## 2021-03-12 DIAGNOSIS — Z4781 Encounter for orthopedic aftercare following surgical amputation: Secondary | ICD-10-CM | POA: Diagnosis not present

## 2021-03-12 DIAGNOSIS — Z4801 Encounter for change or removal of surgical wound dressing: Secondary | ICD-10-CM | POA: Diagnosis not present

## 2021-03-15 DIAGNOSIS — Z4801 Encounter for change or removal of surgical wound dressing: Secondary | ICD-10-CM | POA: Diagnosis not present

## 2021-03-15 DIAGNOSIS — E1151 Type 2 diabetes mellitus with diabetic peripheral angiopathy without gangrene: Secondary | ICD-10-CM | POA: Diagnosis not present

## 2021-03-15 DIAGNOSIS — Z48812 Encounter for surgical aftercare following surgery on the circulatory system: Secondary | ICD-10-CM | POA: Diagnosis not present

## 2021-03-15 DIAGNOSIS — L89612 Pressure ulcer of right heel, stage 2: Secondary | ICD-10-CM | POA: Diagnosis not present

## 2021-03-15 DIAGNOSIS — M109 Gout, unspecified: Secondary | ICD-10-CM | POA: Diagnosis not present

## 2021-03-15 DIAGNOSIS — I10 Essential (primary) hypertension: Secondary | ICD-10-CM | POA: Diagnosis not present

## 2021-03-15 DIAGNOSIS — Z4781 Encounter for orthopedic aftercare following surgical amputation: Secondary | ICD-10-CM | POA: Diagnosis not present

## 2021-03-15 DIAGNOSIS — J449 Chronic obstructive pulmonary disease, unspecified: Secondary | ICD-10-CM | POA: Diagnosis not present

## 2021-03-15 DIAGNOSIS — I70201 Unspecified atherosclerosis of native arteries of extremities, right leg: Secondary | ICD-10-CM | POA: Diagnosis not present

## 2021-03-19 DIAGNOSIS — Z48812 Encounter for surgical aftercare following surgery on the circulatory system: Secondary | ICD-10-CM | POA: Diagnosis not present

## 2021-03-19 DIAGNOSIS — Z4801 Encounter for change or removal of surgical wound dressing: Secondary | ICD-10-CM | POA: Diagnosis not present

## 2021-03-19 DIAGNOSIS — L89612 Pressure ulcer of right heel, stage 2: Secondary | ICD-10-CM | POA: Diagnosis not present

## 2021-03-19 DIAGNOSIS — J449 Chronic obstructive pulmonary disease, unspecified: Secondary | ICD-10-CM | POA: Diagnosis not present

## 2021-03-19 DIAGNOSIS — Z4781 Encounter for orthopedic aftercare following surgical amputation: Secondary | ICD-10-CM | POA: Diagnosis not present

## 2021-03-19 DIAGNOSIS — I10 Essential (primary) hypertension: Secondary | ICD-10-CM | POA: Diagnosis not present

## 2021-03-19 DIAGNOSIS — I70201 Unspecified atherosclerosis of native arteries of extremities, right leg: Secondary | ICD-10-CM | POA: Diagnosis not present

## 2021-03-19 DIAGNOSIS — M109 Gout, unspecified: Secondary | ICD-10-CM | POA: Diagnosis not present

## 2021-03-19 DIAGNOSIS — E1151 Type 2 diabetes mellitus with diabetic peripheral angiopathy without gangrene: Secondary | ICD-10-CM | POA: Diagnosis not present

## 2021-03-20 ENCOUNTER — Other Ambulatory Visit: Payer: Self-pay | Admitting: Cardiology

## 2021-03-22 DIAGNOSIS — L89612 Pressure ulcer of right heel, stage 2: Secondary | ICD-10-CM | POA: Diagnosis not present

## 2021-03-22 DIAGNOSIS — Z48812 Encounter for surgical aftercare following surgery on the circulatory system: Secondary | ICD-10-CM | POA: Diagnosis not present

## 2021-03-22 DIAGNOSIS — E1151 Type 2 diabetes mellitus with diabetic peripheral angiopathy without gangrene: Secondary | ICD-10-CM | POA: Diagnosis not present

## 2021-03-22 DIAGNOSIS — Z4801 Encounter for change or removal of surgical wound dressing: Secondary | ICD-10-CM | POA: Diagnosis not present

## 2021-03-22 DIAGNOSIS — M109 Gout, unspecified: Secondary | ICD-10-CM | POA: Diagnosis not present

## 2021-03-22 DIAGNOSIS — I70201 Unspecified atherosclerosis of native arteries of extremities, right leg: Secondary | ICD-10-CM | POA: Diagnosis not present

## 2021-03-22 DIAGNOSIS — Z4781 Encounter for orthopedic aftercare following surgical amputation: Secondary | ICD-10-CM | POA: Diagnosis not present

## 2021-03-22 DIAGNOSIS — J449 Chronic obstructive pulmonary disease, unspecified: Secondary | ICD-10-CM | POA: Diagnosis not present

## 2021-03-22 DIAGNOSIS — I10 Essential (primary) hypertension: Secondary | ICD-10-CM | POA: Diagnosis not present

## 2021-03-26 DIAGNOSIS — M1991 Primary osteoarthritis, unspecified site: Secondary | ICD-10-CM | POA: Diagnosis not present

## 2021-03-26 DIAGNOSIS — J449 Chronic obstructive pulmonary disease, unspecified: Secondary | ICD-10-CM | POA: Diagnosis not present

## 2021-03-26 DIAGNOSIS — I10 Essential (primary) hypertension: Secondary | ICD-10-CM | POA: Diagnosis not present

## 2021-03-26 DIAGNOSIS — I70201 Unspecified atherosclerosis of native arteries of extremities, right leg: Secondary | ICD-10-CM | POA: Diagnosis not present

## 2021-03-26 DIAGNOSIS — Z4781 Encounter for orthopedic aftercare following surgical amputation: Secondary | ICD-10-CM | POA: Diagnosis not present

## 2021-03-26 DIAGNOSIS — E1151 Type 2 diabetes mellitus with diabetic peripheral angiopathy without gangrene: Secondary | ICD-10-CM | POA: Diagnosis not present

## 2021-03-26 DIAGNOSIS — M5136 Other intervertebral disc degeneration, lumbar region: Secondary | ICD-10-CM | POA: Diagnosis not present

## 2021-03-26 DIAGNOSIS — G894 Chronic pain syndrome: Secondary | ICD-10-CM | POA: Diagnosis not present

## 2021-03-26 DIAGNOSIS — Z4801 Encounter for change or removal of surgical wound dressing: Secondary | ICD-10-CM | POA: Diagnosis not present

## 2021-03-26 DIAGNOSIS — M109 Gout, unspecified: Secondary | ICD-10-CM | POA: Diagnosis not present

## 2021-03-26 DIAGNOSIS — Z48812 Encounter for surgical aftercare following surgery on the circulatory system: Secondary | ICD-10-CM | POA: Diagnosis not present

## 2021-03-26 DIAGNOSIS — M5030 Other cervical disc degeneration, unspecified cervical region: Secondary | ICD-10-CM | POA: Diagnosis not present

## 2021-03-26 DIAGNOSIS — M159 Polyosteoarthritis, unspecified: Secondary | ICD-10-CM | POA: Diagnosis not present

## 2021-03-26 DIAGNOSIS — L89612 Pressure ulcer of right heel, stage 2: Secondary | ICD-10-CM | POA: Diagnosis not present

## 2021-03-29 DIAGNOSIS — Z4781 Encounter for orthopedic aftercare following surgical amputation: Secondary | ICD-10-CM | POA: Diagnosis not present

## 2021-03-29 DIAGNOSIS — I70201 Unspecified atherosclerosis of native arteries of extremities, right leg: Secondary | ICD-10-CM | POA: Diagnosis not present

## 2021-03-29 DIAGNOSIS — M109 Gout, unspecified: Secondary | ICD-10-CM | POA: Diagnosis not present

## 2021-03-29 DIAGNOSIS — Z48812 Encounter for surgical aftercare following surgery on the circulatory system: Secondary | ICD-10-CM | POA: Diagnosis not present

## 2021-03-29 DIAGNOSIS — J449 Chronic obstructive pulmonary disease, unspecified: Secondary | ICD-10-CM | POA: Diagnosis not present

## 2021-03-29 DIAGNOSIS — E1151 Type 2 diabetes mellitus with diabetic peripheral angiopathy without gangrene: Secondary | ICD-10-CM | POA: Diagnosis not present

## 2021-03-29 DIAGNOSIS — I10 Essential (primary) hypertension: Secondary | ICD-10-CM | POA: Diagnosis not present

## 2021-03-29 DIAGNOSIS — Z4801 Encounter for change or removal of surgical wound dressing: Secondary | ICD-10-CM | POA: Diagnosis not present

## 2021-03-29 DIAGNOSIS — L89612 Pressure ulcer of right heel, stage 2: Secondary | ICD-10-CM | POA: Diagnosis not present

## 2021-04-02 DIAGNOSIS — I10 Essential (primary) hypertension: Secondary | ICD-10-CM | POA: Diagnosis not present

## 2021-04-02 DIAGNOSIS — Z4781 Encounter for orthopedic aftercare following surgical amputation: Secondary | ICD-10-CM | POA: Diagnosis not present

## 2021-04-02 DIAGNOSIS — Z4801 Encounter for change or removal of surgical wound dressing: Secondary | ICD-10-CM | POA: Diagnosis not present

## 2021-04-02 DIAGNOSIS — E1151 Type 2 diabetes mellitus with diabetic peripheral angiopathy without gangrene: Secondary | ICD-10-CM | POA: Diagnosis not present

## 2021-04-02 DIAGNOSIS — I70201 Unspecified atherosclerosis of native arteries of extremities, right leg: Secondary | ICD-10-CM | POA: Diagnosis not present

## 2021-04-02 DIAGNOSIS — J449 Chronic obstructive pulmonary disease, unspecified: Secondary | ICD-10-CM | POA: Diagnosis not present

## 2021-04-02 DIAGNOSIS — Z48812 Encounter for surgical aftercare following surgery on the circulatory system: Secondary | ICD-10-CM | POA: Diagnosis not present

## 2021-04-02 DIAGNOSIS — M109 Gout, unspecified: Secondary | ICD-10-CM | POA: Diagnosis not present

## 2021-04-02 DIAGNOSIS — L89612 Pressure ulcer of right heel, stage 2: Secondary | ICD-10-CM | POA: Diagnosis not present

## 2021-04-06 DIAGNOSIS — Z4801 Encounter for change or removal of surgical wound dressing: Secondary | ICD-10-CM | POA: Diagnosis not present

## 2021-04-06 DIAGNOSIS — Z48812 Encounter for surgical aftercare following surgery on the circulatory system: Secondary | ICD-10-CM | POA: Diagnosis not present

## 2021-04-06 DIAGNOSIS — J449 Chronic obstructive pulmonary disease, unspecified: Secondary | ICD-10-CM | POA: Diagnosis not present

## 2021-04-06 DIAGNOSIS — L89612 Pressure ulcer of right heel, stage 2: Secondary | ICD-10-CM | POA: Diagnosis not present

## 2021-04-06 DIAGNOSIS — I10 Essential (primary) hypertension: Secondary | ICD-10-CM | POA: Diagnosis not present

## 2021-04-06 DIAGNOSIS — E1151 Type 2 diabetes mellitus with diabetic peripheral angiopathy without gangrene: Secondary | ICD-10-CM | POA: Diagnosis not present

## 2021-04-06 DIAGNOSIS — Z4781 Encounter for orthopedic aftercare following surgical amputation: Secondary | ICD-10-CM | POA: Diagnosis not present

## 2021-04-06 DIAGNOSIS — M109 Gout, unspecified: Secondary | ICD-10-CM | POA: Diagnosis not present

## 2021-04-06 DIAGNOSIS — I70201 Unspecified atherosclerosis of native arteries of extremities, right leg: Secondary | ICD-10-CM | POA: Diagnosis not present

## 2021-04-09 DIAGNOSIS — Z48812 Encounter for surgical aftercare following surgery on the circulatory system: Secondary | ICD-10-CM | POA: Diagnosis not present

## 2021-04-09 DIAGNOSIS — Z4781 Encounter for orthopedic aftercare following surgical amputation: Secondary | ICD-10-CM | POA: Diagnosis not present

## 2021-04-09 DIAGNOSIS — I70201 Unspecified atherosclerosis of native arteries of extremities, right leg: Secondary | ICD-10-CM | POA: Diagnosis not present

## 2021-04-09 DIAGNOSIS — Z4801 Encounter for change or removal of surgical wound dressing: Secondary | ICD-10-CM | POA: Diagnosis not present

## 2021-04-09 DIAGNOSIS — L89612 Pressure ulcer of right heel, stage 2: Secondary | ICD-10-CM | POA: Diagnosis not present

## 2021-04-09 DIAGNOSIS — M109 Gout, unspecified: Secondary | ICD-10-CM | POA: Diagnosis not present

## 2021-04-09 DIAGNOSIS — J449 Chronic obstructive pulmonary disease, unspecified: Secondary | ICD-10-CM | POA: Diagnosis not present

## 2021-04-09 DIAGNOSIS — E1151 Type 2 diabetes mellitus with diabetic peripheral angiopathy without gangrene: Secondary | ICD-10-CM | POA: Diagnosis not present

## 2021-04-09 DIAGNOSIS — I10 Essential (primary) hypertension: Secondary | ICD-10-CM | POA: Diagnosis not present

## 2021-04-10 DIAGNOSIS — E1165 Type 2 diabetes mellitus with hyperglycemia: Secondary | ICD-10-CM | POA: Diagnosis not present

## 2021-04-10 DIAGNOSIS — I10 Essential (primary) hypertension: Secondary | ICD-10-CM | POA: Diagnosis not present

## 2021-04-11 DIAGNOSIS — E1151 Type 2 diabetes mellitus with diabetic peripheral angiopathy without gangrene: Secondary | ICD-10-CM | POA: Diagnosis not present

## 2021-04-11 DIAGNOSIS — Z4801 Encounter for change or removal of surgical wound dressing: Secondary | ICD-10-CM | POA: Diagnosis not present

## 2021-04-11 DIAGNOSIS — L89612 Pressure ulcer of right heel, stage 2: Secondary | ICD-10-CM | POA: Diagnosis not present

## 2021-04-11 DIAGNOSIS — Z48812 Encounter for surgical aftercare following surgery on the circulatory system: Secondary | ICD-10-CM | POA: Diagnosis not present

## 2021-04-11 DIAGNOSIS — I10 Essential (primary) hypertension: Secondary | ICD-10-CM | POA: Diagnosis not present

## 2021-04-11 DIAGNOSIS — Z4781 Encounter for orthopedic aftercare following surgical amputation: Secondary | ICD-10-CM | POA: Diagnosis not present

## 2021-04-11 DIAGNOSIS — M109 Gout, unspecified: Secondary | ICD-10-CM | POA: Diagnosis not present

## 2021-04-11 DIAGNOSIS — I70201 Unspecified atherosclerosis of native arteries of extremities, right leg: Secondary | ICD-10-CM | POA: Diagnosis not present

## 2021-04-11 DIAGNOSIS — J449 Chronic obstructive pulmonary disease, unspecified: Secondary | ICD-10-CM | POA: Diagnosis not present

## 2021-04-16 ENCOUNTER — Other Ambulatory Visit: Payer: Self-pay | Admitting: Cardiology

## 2021-04-17 DIAGNOSIS — I10 Essential (primary) hypertension: Secondary | ICD-10-CM | POA: Diagnosis not present

## 2021-04-17 DIAGNOSIS — J449 Chronic obstructive pulmonary disease, unspecified: Secondary | ICD-10-CM | POA: Diagnosis not present

## 2021-04-17 DIAGNOSIS — Z4781 Encounter for orthopedic aftercare following surgical amputation: Secondary | ICD-10-CM | POA: Diagnosis not present

## 2021-04-17 DIAGNOSIS — I70201 Unspecified atherosclerosis of native arteries of extremities, right leg: Secondary | ICD-10-CM | POA: Diagnosis not present

## 2021-04-17 DIAGNOSIS — G894 Chronic pain syndrome: Secondary | ICD-10-CM | POA: Diagnosis not present

## 2021-04-17 DIAGNOSIS — M109 Gout, unspecified: Secondary | ICD-10-CM | POA: Diagnosis not present

## 2021-04-17 DIAGNOSIS — E1151 Type 2 diabetes mellitus with diabetic peripheral angiopathy without gangrene: Secondary | ICD-10-CM | POA: Diagnosis not present

## 2021-04-17 DIAGNOSIS — F319 Bipolar disorder, unspecified: Secondary | ICD-10-CM | POA: Diagnosis not present

## 2021-04-17 DIAGNOSIS — M47816 Spondylosis without myelopathy or radiculopathy, lumbar region: Secondary | ICD-10-CM | POA: Diagnosis not present

## 2021-04-19 DIAGNOSIS — J449 Chronic obstructive pulmonary disease, unspecified: Secondary | ICD-10-CM | POA: Diagnosis not present

## 2021-04-19 DIAGNOSIS — I70201 Unspecified atherosclerosis of native arteries of extremities, right leg: Secondary | ICD-10-CM | POA: Diagnosis not present

## 2021-04-19 DIAGNOSIS — E1151 Type 2 diabetes mellitus with diabetic peripheral angiopathy without gangrene: Secondary | ICD-10-CM | POA: Diagnosis not present

## 2021-04-19 DIAGNOSIS — F319 Bipolar disorder, unspecified: Secondary | ICD-10-CM | POA: Diagnosis not present

## 2021-04-19 DIAGNOSIS — G894 Chronic pain syndrome: Secondary | ICD-10-CM | POA: Diagnosis not present

## 2021-04-19 DIAGNOSIS — Z4781 Encounter for orthopedic aftercare following surgical amputation: Secondary | ICD-10-CM | POA: Diagnosis not present

## 2021-04-19 DIAGNOSIS — I10 Essential (primary) hypertension: Secondary | ICD-10-CM | POA: Diagnosis not present

## 2021-04-19 DIAGNOSIS — M109 Gout, unspecified: Secondary | ICD-10-CM | POA: Diagnosis not present

## 2021-04-19 DIAGNOSIS — M47816 Spondylosis without myelopathy or radiculopathy, lumbar region: Secondary | ICD-10-CM | POA: Diagnosis not present

## 2021-04-24 DIAGNOSIS — M109 Gout, unspecified: Secondary | ICD-10-CM | POA: Diagnosis not present

## 2021-04-24 DIAGNOSIS — M47816 Spondylosis without myelopathy or radiculopathy, lumbar region: Secondary | ICD-10-CM | POA: Diagnosis not present

## 2021-04-24 DIAGNOSIS — E1151 Type 2 diabetes mellitus with diabetic peripheral angiopathy without gangrene: Secondary | ICD-10-CM | POA: Diagnosis not present

## 2021-04-24 DIAGNOSIS — F319 Bipolar disorder, unspecified: Secondary | ICD-10-CM | POA: Diagnosis not present

## 2021-04-24 DIAGNOSIS — G894 Chronic pain syndrome: Secondary | ICD-10-CM | POA: Diagnosis not present

## 2021-04-24 DIAGNOSIS — Z4781 Encounter for orthopedic aftercare following surgical amputation: Secondary | ICD-10-CM | POA: Diagnosis not present

## 2021-04-24 DIAGNOSIS — J449 Chronic obstructive pulmonary disease, unspecified: Secondary | ICD-10-CM | POA: Diagnosis not present

## 2021-04-24 DIAGNOSIS — I10 Essential (primary) hypertension: Secondary | ICD-10-CM | POA: Diagnosis not present

## 2021-04-24 DIAGNOSIS — I70201 Unspecified atherosclerosis of native arteries of extremities, right leg: Secondary | ICD-10-CM | POA: Diagnosis not present

## 2021-04-26 DIAGNOSIS — Z4781 Encounter for orthopedic aftercare following surgical amputation: Secondary | ICD-10-CM | POA: Diagnosis not present

## 2021-04-26 DIAGNOSIS — J449 Chronic obstructive pulmonary disease, unspecified: Secondary | ICD-10-CM | POA: Diagnosis not present

## 2021-04-26 DIAGNOSIS — E1151 Type 2 diabetes mellitus with diabetic peripheral angiopathy without gangrene: Secondary | ICD-10-CM | POA: Diagnosis not present

## 2021-04-26 DIAGNOSIS — I70201 Unspecified atherosclerosis of native arteries of extremities, right leg: Secondary | ICD-10-CM | POA: Diagnosis not present

## 2021-04-26 DIAGNOSIS — I10 Essential (primary) hypertension: Secondary | ICD-10-CM | POA: Diagnosis not present

## 2021-04-26 DIAGNOSIS — F319 Bipolar disorder, unspecified: Secondary | ICD-10-CM | POA: Diagnosis not present

## 2021-04-26 DIAGNOSIS — G894 Chronic pain syndrome: Secondary | ICD-10-CM | POA: Diagnosis not present

## 2021-04-26 DIAGNOSIS — M109 Gout, unspecified: Secondary | ICD-10-CM | POA: Diagnosis not present

## 2021-04-26 DIAGNOSIS — M47816 Spondylosis without myelopathy or radiculopathy, lumbar region: Secondary | ICD-10-CM | POA: Diagnosis not present

## 2021-04-30 DIAGNOSIS — M47816 Spondylosis without myelopathy or radiculopathy, lumbar region: Secondary | ICD-10-CM | POA: Diagnosis not present

## 2021-04-30 DIAGNOSIS — M109 Gout, unspecified: Secondary | ICD-10-CM | POA: Diagnosis not present

## 2021-04-30 DIAGNOSIS — E1151 Type 2 diabetes mellitus with diabetic peripheral angiopathy without gangrene: Secondary | ICD-10-CM | POA: Diagnosis not present

## 2021-04-30 DIAGNOSIS — J449 Chronic obstructive pulmonary disease, unspecified: Secondary | ICD-10-CM | POA: Diagnosis not present

## 2021-04-30 DIAGNOSIS — F319 Bipolar disorder, unspecified: Secondary | ICD-10-CM | POA: Diagnosis not present

## 2021-04-30 DIAGNOSIS — Z4781 Encounter for orthopedic aftercare following surgical amputation: Secondary | ICD-10-CM | POA: Diagnosis not present

## 2021-04-30 DIAGNOSIS — I10 Essential (primary) hypertension: Secondary | ICD-10-CM | POA: Diagnosis not present

## 2021-04-30 DIAGNOSIS — G894 Chronic pain syndrome: Secondary | ICD-10-CM | POA: Diagnosis not present

## 2021-04-30 DIAGNOSIS — I70201 Unspecified atherosclerosis of native arteries of extremities, right leg: Secondary | ICD-10-CM | POA: Diagnosis not present

## 2021-05-04 DIAGNOSIS — Z4781 Encounter for orthopedic aftercare following surgical amputation: Secondary | ICD-10-CM | POA: Diagnosis not present

## 2021-05-04 DIAGNOSIS — G894 Chronic pain syndrome: Secondary | ICD-10-CM | POA: Diagnosis not present

## 2021-05-04 DIAGNOSIS — I70201 Unspecified atherosclerosis of native arteries of extremities, right leg: Secondary | ICD-10-CM | POA: Diagnosis not present

## 2021-05-04 DIAGNOSIS — I10 Essential (primary) hypertension: Secondary | ICD-10-CM | POA: Diagnosis not present

## 2021-05-04 DIAGNOSIS — F319 Bipolar disorder, unspecified: Secondary | ICD-10-CM | POA: Diagnosis not present

## 2021-05-04 DIAGNOSIS — M109 Gout, unspecified: Secondary | ICD-10-CM | POA: Diagnosis not present

## 2021-05-04 DIAGNOSIS — J449 Chronic obstructive pulmonary disease, unspecified: Secondary | ICD-10-CM | POA: Diagnosis not present

## 2021-05-04 DIAGNOSIS — E1151 Type 2 diabetes mellitus with diabetic peripheral angiopathy without gangrene: Secondary | ICD-10-CM | POA: Diagnosis not present

## 2021-05-04 DIAGNOSIS — M47816 Spondylosis without myelopathy or radiculopathy, lumbar region: Secondary | ICD-10-CM | POA: Diagnosis not present

## 2021-05-07 DIAGNOSIS — M47816 Spondylosis without myelopathy or radiculopathy, lumbar region: Secondary | ICD-10-CM | POA: Diagnosis not present

## 2021-05-07 DIAGNOSIS — G894 Chronic pain syndrome: Secondary | ICD-10-CM | POA: Diagnosis not present

## 2021-05-07 DIAGNOSIS — F319 Bipolar disorder, unspecified: Secondary | ICD-10-CM | POA: Diagnosis not present

## 2021-05-07 DIAGNOSIS — J449 Chronic obstructive pulmonary disease, unspecified: Secondary | ICD-10-CM | POA: Diagnosis not present

## 2021-05-07 DIAGNOSIS — I10 Essential (primary) hypertension: Secondary | ICD-10-CM | POA: Diagnosis not present

## 2021-05-07 DIAGNOSIS — E1151 Type 2 diabetes mellitus with diabetic peripheral angiopathy without gangrene: Secondary | ICD-10-CM | POA: Diagnosis not present

## 2021-05-07 DIAGNOSIS — I70201 Unspecified atherosclerosis of native arteries of extremities, right leg: Secondary | ICD-10-CM | POA: Diagnosis not present

## 2021-05-07 DIAGNOSIS — M109 Gout, unspecified: Secondary | ICD-10-CM | POA: Diagnosis not present

## 2021-05-07 DIAGNOSIS — Z4781 Encounter for orthopedic aftercare following surgical amputation: Secondary | ICD-10-CM | POA: Diagnosis not present

## 2021-05-08 DIAGNOSIS — I10 Essential (primary) hypertension: Secondary | ICD-10-CM | POA: Diagnosis not present

## 2021-05-08 DIAGNOSIS — E1165 Type 2 diabetes mellitus with hyperglycemia: Secondary | ICD-10-CM | POA: Diagnosis not present

## 2021-05-09 DIAGNOSIS — G894 Chronic pain syndrome: Secondary | ICD-10-CM | POA: Diagnosis not present

## 2021-05-09 DIAGNOSIS — R600 Localized edema: Secondary | ICD-10-CM | POA: Diagnosis not present

## 2021-05-10 DIAGNOSIS — J449 Chronic obstructive pulmonary disease, unspecified: Secondary | ICD-10-CM | POA: Diagnosis not present

## 2021-05-10 DIAGNOSIS — Z4781 Encounter for orthopedic aftercare following surgical amputation: Secondary | ICD-10-CM | POA: Diagnosis not present

## 2021-05-10 DIAGNOSIS — I10 Essential (primary) hypertension: Secondary | ICD-10-CM | POA: Diagnosis not present

## 2021-05-10 DIAGNOSIS — E1151 Type 2 diabetes mellitus with diabetic peripheral angiopathy without gangrene: Secondary | ICD-10-CM | POA: Diagnosis not present

## 2021-05-10 DIAGNOSIS — M47816 Spondylosis without myelopathy or radiculopathy, lumbar region: Secondary | ICD-10-CM | POA: Diagnosis not present

## 2021-05-10 DIAGNOSIS — F319 Bipolar disorder, unspecified: Secondary | ICD-10-CM | POA: Diagnosis not present

## 2021-05-10 DIAGNOSIS — M109 Gout, unspecified: Secondary | ICD-10-CM | POA: Diagnosis not present

## 2021-05-10 DIAGNOSIS — I70201 Unspecified atherosclerosis of native arteries of extremities, right leg: Secondary | ICD-10-CM | POA: Diagnosis not present

## 2021-05-10 DIAGNOSIS — G894 Chronic pain syndrome: Secondary | ICD-10-CM | POA: Diagnosis not present

## 2021-05-14 DIAGNOSIS — M109 Gout, unspecified: Secondary | ICD-10-CM | POA: Diagnosis not present

## 2021-05-14 DIAGNOSIS — G894 Chronic pain syndrome: Secondary | ICD-10-CM | POA: Diagnosis not present

## 2021-05-14 DIAGNOSIS — I70201 Unspecified atherosclerosis of native arteries of extremities, right leg: Secondary | ICD-10-CM | POA: Diagnosis not present

## 2021-05-14 DIAGNOSIS — E1151 Type 2 diabetes mellitus with diabetic peripheral angiopathy without gangrene: Secondary | ICD-10-CM | POA: Diagnosis not present

## 2021-05-14 DIAGNOSIS — J449 Chronic obstructive pulmonary disease, unspecified: Secondary | ICD-10-CM | POA: Diagnosis not present

## 2021-05-14 DIAGNOSIS — M47816 Spondylosis without myelopathy or radiculopathy, lumbar region: Secondary | ICD-10-CM | POA: Diagnosis not present

## 2021-05-14 DIAGNOSIS — F319 Bipolar disorder, unspecified: Secondary | ICD-10-CM | POA: Diagnosis not present

## 2021-05-14 DIAGNOSIS — Z4781 Encounter for orthopedic aftercare following surgical amputation: Secondary | ICD-10-CM | POA: Diagnosis not present

## 2021-05-14 DIAGNOSIS — I10 Essential (primary) hypertension: Secondary | ICD-10-CM | POA: Diagnosis not present

## 2021-05-17 ENCOUNTER — Other Ambulatory Visit (HOSPITAL_COMMUNITY): Payer: Medicare HMO

## 2021-05-17 ENCOUNTER — Ambulatory Visit: Payer: Medicare HMO | Admitting: Vascular Surgery

## 2021-05-17 ENCOUNTER — Encounter (HOSPITAL_COMMUNITY): Payer: Medicare HMO

## 2021-05-17 DIAGNOSIS — M47816 Spondylosis without myelopathy or radiculopathy, lumbar region: Secondary | ICD-10-CM | POA: Diagnosis not present

## 2021-05-17 DIAGNOSIS — G894 Chronic pain syndrome: Secondary | ICD-10-CM | POA: Diagnosis not present

## 2021-05-17 DIAGNOSIS — I10 Essential (primary) hypertension: Secondary | ICD-10-CM | POA: Diagnosis not present

## 2021-05-17 DIAGNOSIS — E1151 Type 2 diabetes mellitus with diabetic peripheral angiopathy without gangrene: Secondary | ICD-10-CM | POA: Diagnosis not present

## 2021-05-17 DIAGNOSIS — F319 Bipolar disorder, unspecified: Secondary | ICD-10-CM | POA: Diagnosis not present

## 2021-05-17 DIAGNOSIS — Z4781 Encounter for orthopedic aftercare following surgical amputation: Secondary | ICD-10-CM | POA: Diagnosis not present

## 2021-05-17 DIAGNOSIS — M109 Gout, unspecified: Secondary | ICD-10-CM | POA: Diagnosis not present

## 2021-05-17 DIAGNOSIS — J449 Chronic obstructive pulmonary disease, unspecified: Secondary | ICD-10-CM | POA: Diagnosis not present

## 2021-05-17 DIAGNOSIS — I70201 Unspecified atherosclerosis of native arteries of extremities, right leg: Secondary | ICD-10-CM | POA: Diagnosis not present

## 2021-05-21 DIAGNOSIS — R600 Localized edema: Secondary | ICD-10-CM | POA: Diagnosis not present

## 2021-05-21 DIAGNOSIS — G894 Chronic pain syndrome: Secondary | ICD-10-CM | POA: Diagnosis not present

## 2021-05-21 DIAGNOSIS — I10 Essential (primary) hypertension: Secondary | ICD-10-CM | POA: Diagnosis not present

## 2021-05-21 DIAGNOSIS — M47816 Spondylosis without myelopathy or radiculopathy, lumbar region: Secondary | ICD-10-CM | POA: Diagnosis not present

## 2021-05-21 DIAGNOSIS — I70201 Unspecified atherosclerosis of native arteries of extremities, right leg: Secondary | ICD-10-CM | POA: Diagnosis not present

## 2021-05-21 DIAGNOSIS — E1151 Type 2 diabetes mellitus with diabetic peripheral angiopathy without gangrene: Secondary | ICD-10-CM | POA: Diagnosis not present

## 2021-05-21 DIAGNOSIS — M109 Gout, unspecified: Secondary | ICD-10-CM | POA: Diagnosis not present

## 2021-05-21 DIAGNOSIS — Z4781 Encounter for orthopedic aftercare following surgical amputation: Secondary | ICD-10-CM | POA: Diagnosis not present

## 2021-05-21 DIAGNOSIS — J449 Chronic obstructive pulmonary disease, unspecified: Secondary | ICD-10-CM | POA: Diagnosis not present

## 2021-05-21 DIAGNOSIS — F319 Bipolar disorder, unspecified: Secondary | ICD-10-CM | POA: Diagnosis not present

## 2021-05-24 DIAGNOSIS — M109 Gout, unspecified: Secondary | ICD-10-CM | POA: Diagnosis not present

## 2021-05-24 DIAGNOSIS — G894 Chronic pain syndrome: Secondary | ICD-10-CM | POA: Diagnosis not present

## 2021-05-24 DIAGNOSIS — M47816 Spondylosis without myelopathy or radiculopathy, lumbar region: Secondary | ICD-10-CM | POA: Diagnosis not present

## 2021-05-24 DIAGNOSIS — J449 Chronic obstructive pulmonary disease, unspecified: Secondary | ICD-10-CM | POA: Diagnosis not present

## 2021-05-24 DIAGNOSIS — Z4781 Encounter for orthopedic aftercare following surgical amputation: Secondary | ICD-10-CM | POA: Diagnosis not present

## 2021-05-24 DIAGNOSIS — F319 Bipolar disorder, unspecified: Secondary | ICD-10-CM | POA: Diagnosis not present

## 2021-05-24 DIAGNOSIS — I70201 Unspecified atherosclerosis of native arteries of extremities, right leg: Secondary | ICD-10-CM | POA: Diagnosis not present

## 2021-05-24 DIAGNOSIS — E1151 Type 2 diabetes mellitus with diabetic peripheral angiopathy without gangrene: Secondary | ICD-10-CM | POA: Diagnosis not present

## 2021-05-24 DIAGNOSIS — I10 Essential (primary) hypertension: Secondary | ICD-10-CM | POA: Diagnosis not present

## 2021-05-28 ENCOUNTER — Other Ambulatory Visit: Payer: Self-pay | Admitting: Cardiology

## 2021-05-29 DIAGNOSIS — M47816 Spondylosis without myelopathy or radiculopathy, lumbar region: Secondary | ICD-10-CM | POA: Diagnosis not present

## 2021-05-29 DIAGNOSIS — F319 Bipolar disorder, unspecified: Secondary | ICD-10-CM | POA: Diagnosis not present

## 2021-05-29 DIAGNOSIS — J449 Chronic obstructive pulmonary disease, unspecified: Secondary | ICD-10-CM | POA: Diagnosis not present

## 2021-05-29 DIAGNOSIS — Z4781 Encounter for orthopedic aftercare following surgical amputation: Secondary | ICD-10-CM | POA: Diagnosis not present

## 2021-05-29 DIAGNOSIS — M109 Gout, unspecified: Secondary | ICD-10-CM | POA: Diagnosis not present

## 2021-05-29 DIAGNOSIS — E1151 Type 2 diabetes mellitus with diabetic peripheral angiopathy without gangrene: Secondary | ICD-10-CM | POA: Diagnosis not present

## 2021-05-29 DIAGNOSIS — G894 Chronic pain syndrome: Secondary | ICD-10-CM | POA: Diagnosis not present

## 2021-05-29 DIAGNOSIS — I10 Essential (primary) hypertension: Secondary | ICD-10-CM | POA: Diagnosis not present

## 2021-05-29 DIAGNOSIS — I70201 Unspecified atherosclerosis of native arteries of extremities, right leg: Secondary | ICD-10-CM | POA: Diagnosis not present

## 2021-05-31 ENCOUNTER — Inpatient Hospital Stay (HOSPITAL_COMMUNITY)
Admission: AD | Admit: 2021-05-31 | Discharge: 2021-06-15 | DRG: 280 | Disposition: A | Discharge status: Skilled Nursing Facility | Payer: Medicare HMO | Source: Other Acute Inpatient Hospital | Attending: Internal Medicine | Admitting: Internal Medicine

## 2021-05-31 DIAGNOSIS — M4712 Other spondylosis with myelopathy, cervical region: Secondary | ICD-10-CM | POA: Diagnosis present

## 2021-05-31 DIAGNOSIS — I959 Hypotension, unspecified: Secondary | ICD-10-CM | POA: Diagnosis present

## 2021-05-31 DIAGNOSIS — Y92239 Unspecified place in hospital as the place of occurrence of the external cause: Secondary | ICD-10-CM | POA: Diagnosis not present

## 2021-05-31 DIAGNOSIS — Z8673 Personal history of transient ischemic attack (TIA), and cerebral infarction without residual deficits: Secondary | ICD-10-CM

## 2021-05-31 DIAGNOSIS — N1330 Unspecified hydronephrosis: Secondary | ICD-10-CM | POA: Diagnosis not present

## 2021-05-31 DIAGNOSIS — D62 Acute posthemorrhagic anemia: Secondary | ICD-10-CM | POA: Diagnosis not present

## 2021-05-31 DIAGNOSIS — R270 Ataxia, unspecified: Secondary | ICD-10-CM | POA: Diagnosis present

## 2021-05-31 DIAGNOSIS — I6381 Other cerebral infarction due to occlusion or stenosis of small artery: Secondary | ICD-10-CM | POA: Diagnosis not present

## 2021-05-31 DIAGNOSIS — G459 Transient cerebral ischemic attack, unspecified: Secondary | ICD-10-CM | POA: Diagnosis not present

## 2021-05-31 DIAGNOSIS — Z95820 Peripheral vascular angioplasty status with implants and grafts: Secondary | ICD-10-CM | POA: Diagnosis not present

## 2021-05-31 DIAGNOSIS — R578 Other shock: Secondary | ICD-10-CM | POA: Diagnosis not present

## 2021-05-31 DIAGNOSIS — Z7401 Bed confinement status: Secondary | ICD-10-CM | POA: Diagnosis not present

## 2021-05-31 DIAGNOSIS — Z880 Allergy status to penicillin: Secondary | ICD-10-CM

## 2021-05-31 DIAGNOSIS — M47812 Spondylosis without myelopathy or radiculopathy, cervical region: Secondary | ICD-10-CM | POA: Diagnosis not present

## 2021-05-31 DIAGNOSIS — R112 Nausea with vomiting, unspecified: Secondary | ICD-10-CM | POA: Diagnosis not present

## 2021-05-31 DIAGNOSIS — M5030 Other cervical disc degeneration, unspecified cervical region: Secondary | ICD-10-CM | POA: Diagnosis present

## 2021-05-31 DIAGNOSIS — K254 Chronic or unspecified gastric ulcer with hemorrhage: Secondary | ICD-10-CM | POA: Diagnosis not present

## 2021-05-31 DIAGNOSIS — Z9049 Acquired absence of other specified parts of digestive tract: Secondary | ICD-10-CM

## 2021-05-31 DIAGNOSIS — J440 Chronic obstructive pulmonary disease with acute lower respiratory infection: Secondary | ICD-10-CM | POA: Diagnosis present

## 2021-05-31 DIAGNOSIS — J189 Pneumonia, unspecified organism: Secondary | ICD-10-CM | POA: Diagnosis not present

## 2021-05-31 DIAGNOSIS — I639 Cerebral infarction, unspecified: Secondary | ICD-10-CM | POA: Diagnosis not present

## 2021-05-31 DIAGNOSIS — I252 Old myocardial infarction: Secondary | ICD-10-CM

## 2021-05-31 DIAGNOSIS — G4700 Insomnia, unspecified: Secondary | ICD-10-CM | POA: Diagnosis present

## 2021-05-31 DIAGNOSIS — J9690 Respiratory failure, unspecified, unspecified whether with hypoxia or hypercapnia: Secondary | ICD-10-CM | POA: Diagnosis not present

## 2021-05-31 DIAGNOSIS — E785 Hyperlipidemia, unspecified: Secondary | ICD-10-CM | POA: Diagnosis not present

## 2021-05-31 DIAGNOSIS — I498 Other specified cardiac arrhythmias: Secondary | ICD-10-CM | POA: Diagnosis not present

## 2021-05-31 DIAGNOSIS — R000 Tachycardia, unspecified: Secondary | ICD-10-CM | POA: Diagnosis present

## 2021-05-31 DIAGNOSIS — I248 Other forms of acute ischemic heart disease: Secondary | ICD-10-CM | POA: Diagnosis present

## 2021-05-31 DIAGNOSIS — Z885 Allergy status to narcotic agent status: Secondary | ICD-10-CM

## 2021-05-31 DIAGNOSIS — R188 Other ascites: Secondary | ICD-10-CM | POA: Diagnosis not present

## 2021-05-31 DIAGNOSIS — E1151 Type 2 diabetes mellitus with diabetic peripheral angiopathy without gangrene: Secondary | ICD-10-CM | POA: Diagnosis present

## 2021-05-31 DIAGNOSIS — K250 Acute gastric ulcer with hemorrhage: Secondary | ICD-10-CM | POA: Diagnosis not present

## 2021-05-31 DIAGNOSIS — M5124 Other intervertebral disc displacement, thoracic region: Secondary | ICD-10-CM | POA: Diagnosis not present

## 2021-05-31 DIAGNOSIS — Z9981 Dependence on supplemental oxygen: Secondary | ICD-10-CM

## 2021-05-31 DIAGNOSIS — Z720 Tobacco use: Secondary | ICD-10-CM | POA: Diagnosis present

## 2021-05-31 DIAGNOSIS — I2584 Coronary atherosclerosis due to calcified coronary lesion: Secondary | ICD-10-CM | POA: Diagnosis present

## 2021-05-31 DIAGNOSIS — Z96641 Presence of right artificial hip joint: Secondary | ICD-10-CM | POA: Diagnosis present

## 2021-05-31 DIAGNOSIS — E119 Type 2 diabetes mellitus without complications: Secondary | ICD-10-CM | POA: Diagnosis not present

## 2021-05-31 DIAGNOSIS — K259 Gastric ulcer, unspecified as acute or chronic, without hemorrhage or perforation: Secondary | ICD-10-CM | POA: Diagnosis not present

## 2021-05-31 DIAGNOSIS — R531 Weakness: Secondary | ICD-10-CM | POA: Diagnosis not present

## 2021-05-31 DIAGNOSIS — K922 Gastrointestinal hemorrhage, unspecified: Secondary | ICD-10-CM | POA: Diagnosis not present

## 2021-05-31 DIAGNOSIS — R5381 Other malaise: Secondary | ICD-10-CM | POA: Diagnosis not present

## 2021-05-31 DIAGNOSIS — E11621 Type 2 diabetes mellitus with foot ulcer: Secondary | ICD-10-CM | POA: Diagnosis present

## 2021-05-31 DIAGNOSIS — J449 Chronic obstructive pulmonary disease, unspecified: Secondary | ICD-10-CM | POA: Diagnosis not present

## 2021-05-31 DIAGNOSIS — Z981 Arthrodesis status: Secondary | ICD-10-CM

## 2021-05-31 DIAGNOSIS — I10 Essential (primary) hypertension: Secondary | ICD-10-CM | POA: Diagnosis present

## 2021-05-31 DIAGNOSIS — E7800 Pure hypercholesterolemia, unspecified: Secondary | ICD-10-CM | POA: Diagnosis present

## 2021-05-31 DIAGNOSIS — I214 Non-ST elevation (NSTEMI) myocardial infarction: Secondary | ICD-10-CM | POA: Diagnosis present

## 2021-05-31 DIAGNOSIS — R0602 Shortness of breath: Secondary | ICD-10-CM | POA: Diagnosis not present

## 2021-05-31 DIAGNOSIS — J9621 Acute and chronic respiratory failure with hypoxia: Secondary | ICD-10-CM | POA: Diagnosis not present

## 2021-05-31 DIAGNOSIS — Z955 Presence of coronary angioplasty implant and graft: Secondary | ICD-10-CM

## 2021-05-31 DIAGNOSIS — Z89421 Acquired absence of other right toe(s): Secondary | ICD-10-CM

## 2021-05-31 DIAGNOSIS — R633 Feeding difficulties, unspecified: Secondary | ICD-10-CM | POA: Diagnosis not present

## 2021-05-31 DIAGNOSIS — J8483 Surfactant mutations of the lung: Secondary | ICD-10-CM | POA: Diagnosis not present

## 2021-05-31 DIAGNOSIS — B3781 Candidal esophagitis: Secondary | ICD-10-CM | POA: Diagnosis present

## 2021-05-31 DIAGNOSIS — R0902 Hypoxemia: Secondary | ICD-10-CM | POA: Diagnosis not present

## 2021-05-31 DIAGNOSIS — R262 Difficulty in walking, not elsewhere classified: Secondary | ICD-10-CM | POA: Diagnosis present

## 2021-05-31 DIAGNOSIS — T45525A Adverse effect of antithrombotic drugs, initial encounter: Secondary | ICD-10-CM | POA: Diagnosis not present

## 2021-05-31 DIAGNOSIS — D509 Iron deficiency anemia, unspecified: Secondary | ICD-10-CM | POA: Diagnosis not present

## 2021-05-31 DIAGNOSIS — M47814 Spondylosis without myelopathy or radiculopathy, thoracic region: Secondary | ICD-10-CM | POA: Diagnosis not present

## 2021-05-31 DIAGNOSIS — F419 Anxiety disorder, unspecified: Secondary | ICD-10-CM | POA: Diagnosis present

## 2021-05-31 DIAGNOSIS — L89151 Pressure ulcer of sacral region, stage 1: Secondary | ICD-10-CM | POA: Diagnosis present

## 2021-05-31 DIAGNOSIS — M5020 Other cervical disc displacement, unspecified cervical region: Secondary | ICD-10-CM | POA: Diagnosis not present

## 2021-05-31 DIAGNOSIS — K3189 Other diseases of stomach and duodenum: Secondary | ICD-10-CM | POA: Diagnosis not present

## 2021-05-31 DIAGNOSIS — J441 Chronic obstructive pulmonary disease with (acute) exacerbation: Secondary | ICD-10-CM | POA: Diagnosis present

## 2021-05-31 DIAGNOSIS — R778 Other specified abnormalities of plasma proteins: Secondary | ICD-10-CM | POA: Diagnosis not present

## 2021-05-31 DIAGNOSIS — Z808 Family history of malignant neoplasm of other organs or systems: Secondary | ICD-10-CM

## 2021-05-31 DIAGNOSIS — I440 Atrioventricular block, first degree: Secondary | ICD-10-CM | POA: Diagnosis not present

## 2021-05-31 DIAGNOSIS — Z7902 Long term (current) use of antithrombotics/antiplatelets: Secondary | ICD-10-CM

## 2021-05-31 DIAGNOSIS — T8789 Other complications of amputation stump: Secondary | ICD-10-CM | POA: Diagnosis present

## 2021-05-31 DIAGNOSIS — I2510 Atherosclerotic heart disease of native coronary artery without angina pectoris: Secondary | ICD-10-CM | POA: Diagnosis not present

## 2021-05-31 DIAGNOSIS — M48062 Spinal stenosis, lumbar region with neurogenic claudication: Secondary | ICD-10-CM | POA: Diagnosis present

## 2021-05-31 DIAGNOSIS — Z79899 Other long term (current) drug therapy: Secondary | ICD-10-CM

## 2021-05-31 DIAGNOSIS — Z79890 Hormone replacement therapy (postmenopausal): Secondary | ICD-10-CM

## 2021-05-31 DIAGNOSIS — E43 Unspecified severe protein-calorie malnutrition: Secondary | ICD-10-CM | POA: Diagnosis present

## 2021-05-31 DIAGNOSIS — J9600 Acute respiratory failure, unspecified whether with hypoxia or hypercapnia: Secondary | ICD-10-CM | POA: Diagnosis not present

## 2021-05-31 DIAGNOSIS — M4802 Spinal stenosis, cervical region: Secondary | ICD-10-CM | POA: Diagnosis present

## 2021-05-31 DIAGNOSIS — Z91040 Latex allergy status: Secondary | ICD-10-CM

## 2021-05-31 DIAGNOSIS — D6832 Hemorrhagic disorder due to extrinsic circulating anticoagulants: Secondary | ICD-10-CM | POA: Diagnosis not present

## 2021-05-31 DIAGNOSIS — L8990 Pressure ulcer of unspecified site, unspecified stage: Secondary | ICD-10-CM | POA: Diagnosis present

## 2021-05-31 DIAGNOSIS — Z515 Encounter for palliative care: Secondary | ICD-10-CM | POA: Diagnosis not present

## 2021-05-31 DIAGNOSIS — M549 Dorsalgia, unspecified: Secondary | ICD-10-CM | POA: Diagnosis not present

## 2021-05-31 DIAGNOSIS — L89322 Pressure ulcer of left buttock, stage 2: Secondary | ICD-10-CM | POA: Diagnosis present

## 2021-05-31 DIAGNOSIS — K920 Hematemesis: Secondary | ICD-10-CM | POA: Diagnosis not present

## 2021-05-31 DIAGNOSIS — E876 Hypokalemia: Secondary | ICD-10-CM | POA: Diagnosis not present

## 2021-05-31 DIAGNOSIS — R079 Chest pain, unspecified: Secondary | ICD-10-CM | POA: Diagnosis not present

## 2021-05-31 DIAGNOSIS — G8929 Other chronic pain: Secondary | ICD-10-CM | POA: Diagnosis present

## 2021-05-31 DIAGNOSIS — I491 Atrial premature depolarization: Secondary | ICD-10-CM | POA: Diagnosis not present

## 2021-05-31 DIAGNOSIS — Z800 Family history of malignant neoplasm of digestive organs: Secondary | ICD-10-CM

## 2021-05-31 DIAGNOSIS — F32A Depression, unspecified: Secondary | ICD-10-CM | POA: Diagnosis present

## 2021-05-31 DIAGNOSIS — R0689 Other abnormalities of breathing: Secondary | ICD-10-CM | POA: Diagnosis not present

## 2021-05-31 DIAGNOSIS — E440 Moderate protein-calorie malnutrition: Secondary | ICD-10-CM | POA: Diagnosis not present

## 2021-05-31 DIAGNOSIS — Z751 Person awaiting admission to adequate facility elsewhere: Secondary | ICD-10-CM

## 2021-05-31 DIAGNOSIS — E1165 Type 2 diabetes mellitus with hyperglycemia: Secondary | ICD-10-CM | POA: Diagnosis not present

## 2021-05-31 DIAGNOSIS — L97519 Non-pressure chronic ulcer of other part of right foot with unspecified severity: Secondary | ICD-10-CM | POA: Diagnosis present

## 2021-05-31 DIAGNOSIS — K921 Melena: Secondary | ICD-10-CM | POA: Diagnosis not present

## 2021-05-31 DIAGNOSIS — Z7984 Long term (current) use of oral hypoglycemic drugs: Secondary | ICD-10-CM

## 2021-05-31 DIAGNOSIS — J9811 Atelectasis: Secondary | ICD-10-CM | POA: Diagnosis not present

## 2021-05-31 DIAGNOSIS — J9691 Respiratory failure, unspecified with hypoxia: Secondary | ICD-10-CM | POA: Diagnosis not present

## 2021-05-31 DIAGNOSIS — F319 Bipolar disorder, unspecified: Secondary | ICD-10-CM | POA: Diagnosis present

## 2021-05-31 DIAGNOSIS — K529 Noninfective gastroenteritis and colitis, unspecified: Secondary | ICD-10-CM | POA: Diagnosis not present

## 2021-05-31 DIAGNOSIS — M1990 Unspecified osteoarthritis, unspecified site: Secondary | ICD-10-CM | POA: Diagnosis present

## 2021-05-31 DIAGNOSIS — J90 Pleural effusion, not elsewhere classified: Secondary | ICD-10-CM | POA: Diagnosis present

## 2021-05-31 DIAGNOSIS — F17210 Nicotine dependence, cigarettes, uncomplicated: Secondary | ICD-10-CM | POA: Diagnosis present

## 2021-05-31 DIAGNOSIS — Z7982 Long term (current) use of aspirin: Secondary | ICD-10-CM | POA: Diagnosis not present

## 2021-05-31 DIAGNOSIS — Z20822 Contact with and (suspected) exposure to covid-19: Secondary | ICD-10-CM | POA: Diagnosis not present

## 2021-05-31 DIAGNOSIS — I701 Atherosclerosis of renal artery: Secondary | ICD-10-CM | POA: Diagnosis not present

## 2021-05-31 DIAGNOSIS — K219 Gastro-esophageal reflux disease without esophagitis: Secondary | ICD-10-CM | POA: Diagnosis not present

## 2021-05-31 DIAGNOSIS — R0609 Other forms of dyspnea: Secondary | ICD-10-CM | POA: Diagnosis not present

## 2021-05-31 DIAGNOSIS — Z7189 Other specified counseling: Secondary | ICD-10-CM | POA: Diagnosis not present

## 2021-05-31 DIAGNOSIS — Z79891 Long term (current) use of opiate analgesic: Secondary | ICD-10-CM

## 2021-05-31 DIAGNOSIS — Z9181 History of falling: Secondary | ICD-10-CM

## 2021-05-31 DIAGNOSIS — Z6821 Body mass index (BMI) 21.0-21.9, adult: Secondary | ICD-10-CM

## 2021-05-31 DIAGNOSIS — T39015A Adverse effect of aspirin, initial encounter: Secondary | ICD-10-CM | POA: Diagnosis not present

## 2021-05-31 LAB — HEMOGLOBIN A1C
Hgb A1c MFr Bld: 5.1 % (ref 4.8–5.6)
Mean Plasma Glucose: 99.67 mg/dL

## 2021-05-31 LAB — GLUCOSE, CAPILLARY
Glucose-Capillary: 61 mg/dL — ABNORMAL LOW (ref 70–99)
Glucose-Capillary: 71 mg/dL (ref 70–99)

## 2021-05-31 LAB — TROPONIN I (HIGH SENSITIVITY): Troponin I (High Sensitivity): 55 ng/L — ABNORMAL HIGH (ref <18)

## 2021-05-31 MED ORDER — ASPIRIN 81 MG PO CHEW
324.0000 mg | CHEWABLE_TABLET | ORAL | Status: AC
  Filled 2021-05-31: qty 4

## 2021-05-31 MED ORDER — METHYLPREDNISOLONE SODIUM SUCC 125 MG IJ SOLR
125.0000 mg | Freq: Once | INTRAMUSCULAR | Status: AC
Start: 2021-05-31 — End: 2021-05-31
  Administered 2021-05-31: 125 mg via INTRAVENOUS
  Filled 2021-05-31: qty 2

## 2021-05-31 MED ORDER — INSULIN ASPART 100 UNIT/ML IJ SOLN
0.0000 [IU] | INTRAMUSCULAR | Status: DC
  Administered 2021-06-01 (×2): 3 [IU] via SUBCUTANEOUS
  Administered 2021-06-02: 2 [IU] via SUBCUTANEOUS

## 2021-05-31 MED ORDER — NITROGLYCERIN 0.4 MG SL SUBL
0.4000 mg | SUBLINGUAL_TABLET | SUBLINGUAL | Status: DC | PRN
  Administered 2021-05-31 – 2021-06-08 (×3): 0.4 mg via SUBLINGUAL
  Filled 2021-05-31 (×4): qty 1

## 2021-05-31 MED ORDER — GUAIFENESIN ER 600 MG PO TB12
600.0000 mg | ORAL_TABLET | Freq: Two times a day (BID) | ORAL | Status: DC
  Administered 2021-05-31 – 2021-06-15 (×29): 600 mg via ORAL
  Filled 2021-05-31 (×29): qty 1

## 2021-05-31 MED ORDER — HYDROCOD POLI-CHLORPHE POLI ER 10-8 MG/5ML PO SUER
5.0000 mL | Freq: Two times a day (BID) | ORAL | Status: DC | PRN
Start: 2021-05-31 — End: 2021-05-31

## 2021-05-31 MED ORDER — PREDNISONE 20 MG PO TABS
40.0000 mg | ORAL_TABLET | Freq: Every day | ORAL | Status: DC
  Administered 2021-06-02 – 2021-06-03 (×2): 40 mg via ORAL
  Filled 2021-05-31 (×2): qty 2

## 2021-05-31 MED ORDER — NICOTINE 21 MG/24HR TD PT24
21.0000 mg | MEDICATED_PATCH | Freq: Every day | TRANSDERMAL | Status: DC
  Administered 2021-05-31 – 2021-06-15 (×16): 21 mg via TRANSDERMAL
  Filled 2021-05-31 (×17): qty 1

## 2021-05-31 MED ORDER — TRAZODONE HCL 50 MG PO TABS
25.0000 mg | ORAL_TABLET | Freq: Every evening | ORAL | Status: DC | PRN
Start: 2021-05-31 — End: 2021-06-15
  Administered 2021-06-02 – 2021-06-11 (×8): 25 mg via ORAL
  Filled 2021-05-31 (×8): qty 1

## 2021-05-31 MED ORDER — ASPIRIN EC 81 MG PO TBEC
81.0000 mg | DELAYED_RELEASE_TABLET | Freq: Every day | ORAL | Status: DC
  Administered 2021-06-01 – 2021-06-03 (×3): 81 mg via ORAL
  Filled 2021-05-31 (×3): qty 1

## 2021-05-31 MED ORDER — ALPRAZOLAM 0.25 MG PO TABS
0.2500 mg | ORAL_TABLET | Freq: Two times a day (BID) | ORAL | Status: DC | PRN
  Administered 2021-05-31 – 2021-06-15 (×19): 0.25 mg via ORAL
  Filled 2021-05-31 (×19): qty 1

## 2021-05-31 MED ORDER — NITROGLYCERIN 2 % TD OINT
1.0000 [in_us] | TOPICAL_OINTMENT | Freq: Four times a day (QID) | TRANSDERMAL | Status: DC
  Administered 2021-06-01 – 2021-06-03 (×9): 1 [in_us] via TOPICAL
  Filled 2021-05-31 (×6): qty 1
  Filled 2021-05-31: qty 2
  Filled 2021-05-31 (×3): qty 1

## 2021-05-31 MED ORDER — ACETAMINOPHEN 325 MG PO TABS
650.0000 mg | ORAL_TABLET | ORAL | Status: DC | PRN
Start: 2021-05-31 — End: 2021-06-15
  Administered 2021-06-02 – 2021-06-13 (×6): 650 mg via ORAL
  Filled 2021-05-31 (×6): qty 2

## 2021-05-31 MED ORDER — MAGNESIUM HYDROXIDE 400 MG/5ML PO SUSP
30.0000 mL | Freq: Every day | ORAL | Status: DC | PRN
Start: 2021-05-31 — End: 2021-06-03

## 2021-05-31 MED ORDER — ONDANSETRON HCL 4 MG/2ML IJ SOLN
4.0000 mg | Freq: Four times a day (QID) | INTRAMUSCULAR | Status: DC | PRN
  Administered 2021-06-03: 4 mg via INTRAVENOUS
  Filled 2021-05-31: qty 2

## 2021-05-31 MED ORDER — FENTANYL CITRATE PF 50 MCG/ML IJ SOSY
25.0000 ug | PREFILLED_SYRINGE | INTRAMUSCULAR | Status: DC | PRN
  Administered 2021-05-31 – 2021-06-02 (×5): 25 ug via INTRAVENOUS
  Filled 2021-05-31 (×5): qty 1

## 2021-05-31 MED ORDER — IPRATROPIUM-ALBUTEROL 0.5-2.5 (3) MG/3ML IN SOLN
3.0000 mL | Freq: Four times a day (QID) | RESPIRATORY_TRACT | Status: DC
Start: 2021-05-31 — End: 2021-06-05
  Administered 2021-05-31 – 2021-06-05 (×18): 3 mL via RESPIRATORY_TRACT
  Filled 2021-05-31 (×18): qty 3

## 2021-05-31 MED ORDER — SODIUM CHLORIDE 0.9 % IV SOLN: INTRAVENOUS | Status: DC

## 2021-05-31 MED ORDER — METHYLPREDNISOLONE SODIUM SUCC 125 MG IJ SOLR
80.0000 mg | INTRAMUSCULAR | Status: AC
  Administered 2021-06-01: 80 mg via INTRAVENOUS
  Filled 2021-05-31: qty 2

## 2021-05-31 MED ORDER — SODIUM CHLORIDE 0.9 % IV BOLUS
250.0000 mL | Freq: Once | INTRAVENOUS | Status: AC
  Administered 2021-05-31: 250 mL via INTRAVENOUS

## 2021-05-31 MED ORDER — HEPARIN (PORCINE) 25000 UT/250ML-% IV SOLN
1350.0000 [IU]/h | INTRAVENOUS | Status: DC
  Administered 2021-05-31: 900 [IU]/h via INTRAVENOUS
  Filled 2021-05-31: qty 250

## 2021-05-31 MED ORDER — ASPIRIN 300 MG RE SUPP
300.0000 mg | RECTAL | Status: AC
  Filled 2021-05-31: qty 1

## 2021-05-31 NOTE — Assessment & Plan Note (Addendum)
Continue nicotine patch. ?Patient was counseled on tobacco cessation. ?

## 2021-05-31 NOTE — Progress Notes (Addendum)
Pt arrived from [facility] via [place].  ?Oriented to room. Cardiac Monitor placed and verified. Heparin gtt @ 900 units/ 9 ml/hr via Left Hand PIV w/o s/s complications at site. MAR not available at this time. Heparin verified with Marya Amsler, RN with handoff.  ?

## 2021-05-31 NOTE — Progress Notes (Signed)
ANTICOAGULATION CONSULT NOTE - Initial Consult ? ?Pharmacy Consult for heparin ?Indication: chest pain/ACS ? ?Allergies  ?Allergen Reactions  ? Morphine And Related Shortness Of Breath and Palpitations  ? Penicillins Anaphylaxis  ? Vicodin [Hydrocodone-Acetaminophen] Shortness Of Breath and Palpitations  ? Latex Hives and Rash  ? ? ?Patient Measurements: ?Height: '5\' 7"'$  (170.2 cm) ?Weight: 60.6 kg (133 lb 11.2 oz) ?IBW/kg (Calculated) : 66.1 ?Heparin Dosing Weight: 59kg ? ?Vital Signs: ?Temp: 97.9 ?F (36.6 ?C) (03/23 2013) ?Temp Source: Oral (03/23 2013) ?BP: 142/98 (03/23 2039) ?Pulse Rate: 101 (03/23 2039) ? ?Labs: ?No results for input(s): HGB, HCT, PLT, APTT, LABPROT, INR, HEPARINUNFRC, HEPRLOWMOCWT, CREATININE, CKTOTAL, CKMB, TROPONINIHS in the last 72 hours. ? ?CrCl cannot be calculated (Patient's most recent lab result is older than the maximum 21 days allowed.). ? ? ?Medical History: ?Past Medical History:  ?Diagnosis Date  ? Anxiety   ? Back pain   ? Bipolar disorder ([place])   ? Borderline hypertension   ? Chronic pain   ? COPD (chronic obstructive pulmonary disease) ([place])   ? Depression with anxiety   ? GERD (gastroesophageal reflux disease)   ? Headache   ? Hip pain   ? Hypercholesterolemia   ? Stroke [facility])   ? Tobacco use   ? ? ?Medications:  ?Medications Prior to Admission  ?Medication Sig Dispense Refill Last Dose  ? albuterol (ACCUNEB) 0.63 MG/3ML nebulizer solution Take 1 ampule by nebulization every 6 (six) hours as needed for wheezing or shortness of breath.   05/30/2021  ? albuterol (PROVENTIL HFA;VENTOLIN HFA) 108 (90 BASE) MCG/ACT inhaler Inhale 2 puffs into the lungs every 6 (six) hours as needed for wheezing.   05/31/2021  ? allopurinol (ZYLOPRIM) 100 MG tablet Take 100 mg by mouth in the morning.   05/30/2021  ? aspirin 81 MG tablet Take 81 mg by mouth daily.   05/31/2021  ? buPROPion (WELLBUTRIN XL) 150 MG 24 hr tablet Take 1 tablet by mouth daily at 6 (six) AM.   05/30/2021  ? calcium carbonate  (TUMS EX) 750 MG chewable tablet Chew 2 tablets by mouth as needed for heartburn.   Past Week  ? clopidogrel (PLAVIX) 75 MG tablet Take 1 tablet (75 mg total) by mouth daily at 6 (six) AM. 30 tablet 2 05/30/2021  ? diclofenac (VOLTAREN) 50 MG EC tablet Take 50 mg by mouth 3 (three) times daily.   05/30/2021  ? gabapentin (NEURONTIN) 300 MG capsule Take 300 mg by mouth 3 (three) times daily.   05/30/2021  ? Magnesium Hydroxide (DULCOLAX SOFT CHEWS) 1200 MG CHEW Chew 1 tablet by mouth as needed (constipation).   Past Week  ? magnesium oxide (MAG-OX) 400 MG tablet Take 1 tablet by mouth daily.   05/30/2021  ? metFORMIN (GLUCOPHAGE) 500 MG tablet Take 2 tablets by mouth 2 (two) times daily.   05/30/2021  ? metoprolol tartrate (LOPRESSOR) 25 MG tablet TAKE 1/2 TABLET BY MOUTH TWICE DAILY Needs appointment for further refills (Patient taking differently: Take 12.5 mg by mouth 2 (two) times daily.) 15 tablet 0 05/30/2021 at 1200  ? omeprazole (PRILOSEC) 40 MG capsule Take 1 capsule by mouth daily at 6 (six) AM.   05/30/2021  ? ondansetron (ZOFRAN) 4 MG tablet Take 4 mg by mouth 3 (three) times daily as needed.   Past Week  ? oxyCODONE-acetaminophen (PERCOCET) 10-325 MG per tablet Take 1-2 tablets by mouth 4 (four) times daily as needed for pain. max FIVE PER DAY   05/30/2021  ?  rosuvastatin (CRESTOR) 10 MG tablet Take 2 tablets (20 mg total) by mouth at bedtime.   05/30/2021  ? tamsulosin (FLOMAX) 0.4 MG CAPS capsule Take 0.4 mg by mouth in the morning.   05/30/2021  ? ibuprofen (ADVIL) 600 MG tablet Take 600 mg by mouth every 6 (six) hours as needed for moderate pain or mild pain.     ? silver sulfADIAZINE (SILVADENE) 1 % cream Apply 1 application topically 2 (two) times daily. apply a 1/16 inch (1.5 mm) thick layer to entire burn area     ? sulfamethoxazole-trimethoprim (BACTRIM DS) 800-160 MG tablet Take 1 tablet by mouth 2 (two) times daily. (Patient not taking: Reported on 05/31/2021) 12 tablet 0 Not Taking  ? ?Scheduled:  ?  aspirin  324 mg Oral NOW  ? Or  ? aspirin  300 mg Rectal NOW  ? [START ON 06/01/2021] aspirin EC  81 mg Oral Daily  ? ?Infusions:  ? sodium chloride    ? ? ?Assessment: ?Pt presented to [facility] with CP. Heparin was started there before transferring here for further evaluation. No anticoagulation prior to admission.  ? ?Scr 0.8 ?Hrb 11.8, plt wnl ?Goal of Therapy:  ?Heparin level 0.3-0.7 units/ml ?Monitor platelets by anticoagulation protocol: Yes ?  ?Plan:  ?Continue heparin at 900 units/hr ?6 hr HL then daily ? ?Onnie Boer, PharmD, BCIDP, AAHIVP, CPP ?Infectious Disease Pharmacist ?05/31/2021 8:46 PM ? ? ? ?

## 2021-05-31 NOTE — Assessment & Plan Note (Addendum)
- 

## 2021-05-31 NOTE — Assessment & Plan Note (Deleted)
-   The patient will be placed on IV steroid therapy with Solu-Medrol. ?-DuoNebs will be provided 4 times daily and every 4 hours as needed. ?

## 2021-05-31 NOTE — Assessment & Plan Note (Addendum)
HLD. ?HTN. ?Initially presented with complaints of chest tightness. ?Troponins were minimally elevated patient was started on IV heparin drip.  Underwent cardiac catheterization which shows nonobstructive CAD. ?Echocardiogram performed, EF 55 to 60%. ?Due to GI bleed patient is currently off of aspirin and Plavix. ?Due to hypotension patient currently off of beta-blockers. ?Continue 40 mg daily Crestor (Crestor dose increased from 10 mg). ?Cardiology currently signed off. ?Outpatient follow-up. ?

## 2021-05-31 NOTE — Assessment & Plan Note (Addendum)
Appreciate wound care consultation.  Monitor. ?

## 2021-05-31 NOTE — Assessment & Plan Note (Deleted)
Continue Protonix °

## 2021-05-31 NOTE — H&P (Addendum)
?  ?  ?[place] ? ? ?PATIENT NAME: Raymond [name]   ? ?MR#:  [id number] ? ?DATE OF BIRTH:  1960/08/22 ? ?DATE OF ADMISSION:  05/31/2021 ? ?PRIMARY CARE PHYSICIAN: [name], MD  ? ?Patient is coming from: Home ? ?REQUESTING/REFERRING PHYSICIAN: [facility] ? ?CHIEF COMPLAINT:  ? ? ?HISTORY OF PRESENT ILLNESS:  ?Raymond [name] is a 61 y.o. Caucasian male with medical history significant for COPD, chronic respiratory failure on home O2 at 2 L/min, GERD, bipolar disorder, chronic pain, heavy tobacco abuse, PVD, CAD status post MI and PCI and stent, type 2 diabetes mellitus and osteoarthritis, who presented to the ER with a Kalisetti midsternal chest pressure graded 10/10 in severity that started last night with associated dyspnea palpitations and radiation to the arm.  He denied any nausea or vomiting or diaphoresis.  He admits to wheezing and dry cough.  He admits to mild urinary frequency and difficulty with urination.  No hematuria or flank pain.  In the ED was given IV fentanyl, GI cocktail and was started on IV bolus and drip of heparin as well as 1 inch of Nitropaste and NicoDerm CQ patch that was later removed in route to the hospital by EMS. ? ?ED Course: When he came here BP was 137/99 with a heart rate of 107 and temperature 97.9 Pulsoxymeter was 97% on 2 L of O2 by nasal cannula.  Labs there revealed a sodium of 138 with potassium of 3.9 chloride 94 CO2 33.8, BUN 10 creatinine 0.8 and blood glucose 77 with calcium of 8.8.  Troponin was 6.8 and albumin 2.1 and other LFTs were within normal except for alk phos of 172.  proBNP was 4571 and magnesium was 1.7.  High-sensitivity troponin I was 107.   Repeat troponin here was 55. ? ?EKG as reviewed by me :  EKG showed sinus tachycardia with a rate of 103 with first-degree AV block repeat EKG showed sinus rhythm with rate of 77 with right axis deviation. ?Imaging: Chest x-ray showed small left pleural effusion with borderline cardiomegaly. ? ?The patient was  directly admitted to cardiac telemetry observation bed for further evaluation and management. ?PAST MEDICAL HISTORY:  ? ?Past Medical History:  ?Diagnosis Date  ? Anxiety   ? Back pain   ? Bipolar disorder ([place])   ? Borderline hypertension   ? Chronic pain   ? COPD (chronic obstructive pulmonary disease) ([place])   ? Depression with anxiety   ? GERD (gastroesophageal reflux disease)   ? Headache   ? Hip pain   ? Hypercholesterolemia   ? Stroke [facility])   ? Tobacco use   ?-Peripheral vascular disease ?- Coronary artery disease status post MI ?- Type 2 diabetes mellitus ?- Osteoarthritis ?-Chronic respiratory failure on home O2 at 2 L/min. ?PAST SURGICAL HISTORY:  ? ?Past Surgical History:  ?Procedure Laterality Date  ? AMPUTATION Right 12/08/2020  ? Procedure: RAY AMPUTATION OF RIGHT FIFTH TOE;  Surgeon: Angelia Mould, MD;  Location[facility];  Service: Vascular;  Laterality: Right;  ? ANGIOPLASTY ILLIAC ARTERY Right 12/08/2020  ? Procedure: ILIAC ANGIOPLASTY WITH INSERTION OF RIGHT COMMON ILIAC 8MM X 79MM VIABAHN STENT;  Surgeon: Angelia Mould, MD;  Location: [place];  Service: Vascular;  Laterality: Right;  ? ANTERIOR FUSION CERVICAL SPINE    ? BACK SURGERY    ? CHOLECYSTECTOMY    ? COLONOSCOPY N/A 10/28/2012  ? EHM:CNOBSJ polyp-hyperplastic. Internal hemorrhoids. Distal 5 cm of terminal ileum appeared normal.  ?  CORONARY ANGIOPLASTY WITH STENT PLACEMENT  1980s  ? ENDARTERECTOMY FEMORAL Right 12/08/2020  ? Procedure: RIGHT FEMORAL ENDARTERECTOMY WITH ILIOFEMORAL THROMBECTOMY;  Surgeon: Angelia Mould, MD;  Location: [facility] OR;  Service: Vascular;  Laterality: Right;  ? ESOPHAGOGASTRODUODENOSCOPY N/A 10/28/2012  ? JXB:JYNWGNFA gastric mucosa with mottling and submucosal petechiae. Mild chronic gastritis but no H. pylori home path. Small hiatal hernia. Duodenal lipoma  ? EYE SURGERY  Left eye  ? as a child  ? FEMORAL-POPLITEAL BYPASS GRAFT Right 12/08/2020  ? Procedure: RIGHT FEMORAL-BELOW KNEE POPLITEAL ARTERY  BYPASS GRAFT;  Surgeon: Angelia Mould, MD;  Location: [facility] OR;  Service: Vascular;  Laterality: Right;  ? LUMBAR SPINE SURGERY    ? 3 x   ? ? ?SOCIAL HISTORY:  ? ?Social History  ? ?Tobacco Use  ? Smoking status: Every Day  ?  Packs/day: 1.50  ?  Years: 30.00  ?  Pack years: 45.00  ?  Types: Cigarettes  ? Smokeless tobacco: Never  ?Substance Use Topics  ? Alcohol use: No  ?  Alcohol/week: 0.0 standard drinks  ?  Comment: history of ETOH abuse in past, none in 4 years  ? ? ?FAMILY HISTORY:  ? ?Family History  ?Problem Relation Age of Onset  ? Stomach cancer Mother   ? Throat cancer Father   ? Colon cancer Neg Hx   ? ? ?DRUG ALLERGIES:  ? ?Allergies  ?Allergen Reactions  ? Morphine And Related Shortness Of Breath and Palpitations  ? Penicillins Anaphylaxis  ? Vicodin [Hydrocodone-Acetaminophen] Shortness Of Breath and Palpitations  ? Latex Hives and Rash  ? ? ?REVIEW OF SYSTEMS:  ? ?ROS ?As per history of present illness. All pertinent systems were reviewed above. Constitutional, HEENT, cardiovascular, respiratory, GI, GU, musculoskeletal, neuro, psychiatric, endocrine, integumentary and hematologic systems were reviewed and are otherwise negative/unremarkable except for positive findings mentioned above in the HPI. ? ? ?MEDICATIONS AT HOME:  ? ?Prior to Admission medications   ?Medication Sig Start Date End Date Taking? Authorizing Provider  ?albuterol (ACCUNEB) 0.63 MG/3ML nebulizer solution Take 1 ampule by nebulization every 6 (six) hours as needed for wheezing or shortness of breath. 10/13/12  Yes [provider]  ?albuterol (PROVENTIL HFA;VENTOLIN HFA) 108 (90 BASE) MCG/ACT inhaler Inhale 2 puffs into the lungs every 6 (six) hours as needed for wheezing.   Yes [provider]  ?allopurinol (ZYLOPRIM) 100 MG tablet Take 100 mg by mouth in the morning. 10/13/12  Yes [provider]  ?aspirin 81 MG tablet Take 81 mg by mouth daily.   Yes [provider]  ?buPROPion (WELLBUTRIN  XL) 150 MG 24 hr tablet Take 1 tablet by mouth daily at 6 (six) AM. 05/09/20  Yes [provider]  ?calcium carbonate (TUMS EX) 750 MG chewable tablet Chew 2 tablets by mouth as needed for heartburn.   Yes [provider]  ?clopidogrel (PLAVIX) 75 MG tablet Take 1 tablet (75 mg total) by mouth daily at 6 (six) AM. 12/14/20  Yes Georgette Shell, MD  ?diclofenac (VOLTAREN) 50 MG EC tablet Take 50 mg by mouth 3 (three) times daily. 02/07/20  Yes [provider]  ?gabapentin (NEURONTIN) 300 MG capsule Take 300 mg by mouth 3 (three) times daily.   Yes [provider]  ?Magnesium Hydroxide (DULCOLAX SOFT CHEWS) 1200 MG CHEW Chew 1 tablet by mouth as needed (constipation).   Yes [provider]  ?magnesium oxide (MAG-OX) 400 MG tablet Take 1 tablet by mouth daily. 11/27/20  Yes [provider]  ?metFORMIN (GLUCOPHAGE) 500 MG tablet Take 2 tablets by mouth 2 (two) times daily.   Yes [provider]  ?metoprolol tartrate (LOPRESSOR) 25 MG tablet TAKE 1/2 TABLET BY MOUTH TWICE DAILY Needs appointment for further refills ?Patient taking differently: Take 12.5 mg by mouth 2 (two) times daily. 05/28/21  Yes BranchAlphonse Guild, MD  ?omeprazole (PRILOSEC) 40 MG capsule Take 1 capsule by mouth daily at 6 (six) AM. 05/09/20  Yes [provider]  ?ondansetron (ZOFRAN) 4 MG tablet Take 4 mg by mouth 3 (three) times daily as needed. 09/27/20  Yes [provider]  ?oxyCODONE-acetaminophen (PERCOCET) 10-325 MG per tablet Take 1-2 tablets by mouth 4 (four) times daily as needed for pain. max FIVE PER DAY 09/24/12  Yes [provider]  ?rosuvastatin (CRESTOR) 10 MG tablet Take 2 tablets (20 mg total) by mouth at bedtime. 12/13/20  Yes Georgette Shell, MD  ?tamsulosin (FLOMAX) 0.4 MG CAPS capsule Take 0.4 mg by mouth in the morning.   Yes [provider]  ?ibuprofen (ADVIL) 600 MG tablet Take 600 mg by mouth every 6 (six) hours as needed for  moderate pain or mild pain.    [provider]  ?silver sulfADIAZINE (SILVADENE) 1 % cream Apply 1 application topically 2 (two) times daily. apply a 1/16 inch (1.5 mm) thick layer to entire bu

## 2021-05-31 NOTE — Assessment & Plan Note (Deleted)
-   We will continue statin therapy. 

## 2021-06-01 ENCOUNTER — Observation Stay (HOSPITAL_COMMUNITY): Payer: Medicare HMO

## 2021-06-01 ENCOUNTER — Encounter (HOSPITAL_COMMUNITY)
Admission: AD | Disposition: A | Discharge status: Skilled Nursing Facility | Payer: Self-pay | Source: Other Acute Inpatient Hospital | Attending: Internal Medicine

## 2021-06-01 DIAGNOSIS — D6832 Hemorrhagic disorder due to extrinsic circulating anticoagulants: Secondary | ICD-10-CM | POA: Diagnosis not present

## 2021-06-01 DIAGNOSIS — M4712 Other spondylosis with myelopathy, cervical region: Secondary | ICD-10-CM | POA: Diagnosis present

## 2021-06-01 DIAGNOSIS — Z515 Encounter for palliative care: Secondary | ICD-10-CM | POA: Diagnosis not present

## 2021-06-01 DIAGNOSIS — Z720 Tobacco use: Secondary | ICD-10-CM

## 2021-06-01 DIAGNOSIS — R778 Other specified abnormalities of plasma proteins: Secondary | ICD-10-CM

## 2021-06-01 DIAGNOSIS — I6381 Other cerebral infarction due to occlusion or stenosis of small artery: Secondary | ICD-10-CM | POA: Diagnosis not present

## 2021-06-01 DIAGNOSIS — Z7189 Other specified counseling: Secondary | ICD-10-CM | POA: Diagnosis not present

## 2021-06-01 DIAGNOSIS — T8789 Other complications of amputation stump: Secondary | ICD-10-CM | POA: Diagnosis not present

## 2021-06-01 DIAGNOSIS — K922 Gastrointestinal hemorrhage, unspecified: Secondary | ICD-10-CM | POA: Diagnosis not present

## 2021-06-01 DIAGNOSIS — Z95820 Peripheral vascular angioplasty status with implants and grafts: Secondary | ICD-10-CM | POA: Diagnosis not present

## 2021-06-01 DIAGNOSIS — L89322 Pressure ulcer of left buttock, stage 2: Secondary | ICD-10-CM | POA: Diagnosis present

## 2021-06-01 DIAGNOSIS — R578 Other shock: Secondary | ICD-10-CM | POA: Diagnosis not present

## 2021-06-01 DIAGNOSIS — D509 Iron deficiency anemia, unspecified: Secondary | ICD-10-CM | POA: Diagnosis not present

## 2021-06-01 DIAGNOSIS — I10 Essential (primary) hypertension: Secondary | ICD-10-CM | POA: Diagnosis present

## 2021-06-01 DIAGNOSIS — J9621 Acute and chronic respiratory failure with hypoxia: Principal | ICD-10-CM | POA: Diagnosis present

## 2021-06-01 DIAGNOSIS — J441 Chronic obstructive pulmonary disease with (acute) exacerbation: Secondary | ICD-10-CM | POA: Diagnosis present

## 2021-06-01 DIAGNOSIS — J90 Pleural effusion, not elsewhere classified: Secondary | ICD-10-CM | POA: Diagnosis present

## 2021-06-01 DIAGNOSIS — R079 Chest pain, unspecified: Secondary | ICD-10-CM

## 2021-06-01 DIAGNOSIS — F319 Bipolar disorder, unspecified: Secondary | ICD-10-CM | POA: Diagnosis present

## 2021-06-01 DIAGNOSIS — I2510 Atherosclerotic heart disease of native coronary artery without angina pectoris: Secondary | ICD-10-CM | POA: Diagnosis not present

## 2021-06-01 DIAGNOSIS — E785 Hyperlipidemia, unspecified: Secondary | ICD-10-CM | POA: Diagnosis not present

## 2021-06-01 DIAGNOSIS — I214 Non-ST elevation (NSTEMI) myocardial infarction: Secondary | ICD-10-CM | POA: Diagnosis present

## 2021-06-01 DIAGNOSIS — J440 Chronic obstructive pulmonary disease with acute lower respiratory infection: Secondary | ICD-10-CM | POA: Diagnosis present

## 2021-06-01 DIAGNOSIS — K250 Acute gastric ulcer with hemorrhage: Secondary | ICD-10-CM | POA: Diagnosis not present

## 2021-06-01 DIAGNOSIS — N1330 Unspecified hydronephrosis: Secondary | ICD-10-CM | POA: Diagnosis not present

## 2021-06-01 DIAGNOSIS — J189 Pneumonia, unspecified organism: Secondary | ICD-10-CM | POA: Diagnosis not present

## 2021-06-01 DIAGNOSIS — D62 Acute posthemorrhagic anemia: Secondary | ICD-10-CM | POA: Diagnosis not present

## 2021-06-01 DIAGNOSIS — B3781 Candidal esophagitis: Secondary | ICD-10-CM | POA: Diagnosis present

## 2021-06-01 DIAGNOSIS — E43 Unspecified severe protein-calorie malnutrition: Secondary | ICD-10-CM | POA: Diagnosis present

## 2021-06-01 DIAGNOSIS — K219 Gastro-esophageal reflux disease without esophagitis: Secondary | ICD-10-CM | POA: Diagnosis present

## 2021-06-01 DIAGNOSIS — E1151 Type 2 diabetes mellitus with diabetic peripheral angiopathy without gangrene: Secondary | ICD-10-CM | POA: Diagnosis present

## 2021-06-01 DIAGNOSIS — Y92239 Unspecified place in hospital as the place of occurrence of the external cause: Secondary | ICD-10-CM | POA: Diagnosis not present

## 2021-06-01 DIAGNOSIS — L8990 Pressure ulcer of unspecified site, unspecified stage: Secondary | ICD-10-CM | POA: Diagnosis present

## 2021-06-01 DIAGNOSIS — Z8673 Personal history of transient ischemic attack (TIA), and cerebral infarction without residual deficits: Secondary | ICD-10-CM | POA: Diagnosis not present

## 2021-06-01 DIAGNOSIS — E11621 Type 2 diabetes mellitus with foot ulcer: Secondary | ICD-10-CM | POA: Diagnosis present

## 2021-06-01 DIAGNOSIS — K254 Chronic or unspecified gastric ulcer with hemorrhage: Secondary | ICD-10-CM | POA: Diagnosis not present

## 2021-06-01 HISTORY — PX: LEFT HEART CATH AND CORONARY ANGIOGRAPHY: CATH118249

## 2021-06-01 LAB — GLUCOSE, CAPILLARY
Glucose-Capillary: 110 mg/dL — ABNORMAL HIGH (ref 70–99)
Glucose-Capillary: 138 mg/dL — ABNORMAL HIGH (ref 70–99)
Glucose-Capillary: 138 mg/dL — ABNORMAL HIGH (ref 70–99)
Glucose-Capillary: 141 mg/dL — ABNORMAL HIGH (ref 70–99)
Glucose-Capillary: 154 mg/dL — ABNORMAL HIGH (ref 70–99)
Glucose-Capillary: 179 mg/dL — ABNORMAL HIGH (ref 70–99)

## 2021-06-01 LAB — ECHOCARDIOGRAM COMPLETE
Height: 67 in
S' Lateral: 3.6 cm
Weight: 2139.2 oz

## 2021-06-01 LAB — CBC
HCT: 32 % — ABNORMAL LOW (ref 39.0–52.0)
Hemoglobin: 9.3 g/dL — ABNORMAL LOW (ref 13.0–17.0)
MCH: 20.3 pg — ABNORMAL LOW (ref 26.0–34.0)
MCHC: 29.1 g/dL — ABNORMAL LOW (ref 30.0–36.0)
MCV: 69.9 fL — ABNORMAL LOW (ref 80.0–100.0)
Platelets: 369 10*3/uL (ref 150–400)
RBC: 4.58 MIL/uL (ref 4.22–5.81)
RDW: 20.3 % — ABNORMAL HIGH (ref 11.5–15.5)
WBC: 7.1 10*3/uL (ref 4.0–10.5)
nRBC: 0 % (ref 0.0–0.2)

## 2021-06-01 LAB — BASIC METABOLIC PANEL
Anion gap: 8 (ref 5–15)
BUN: 9 mg/dL (ref 6–20)
CO2: 28 mmol/L (ref 22–32)
Calcium: 7.6 mg/dL — ABNORMAL LOW (ref 8.9–10.3)
Chloride: 96 mmol/L — ABNORMAL LOW (ref 98–111)
Creatinine, Ser: 0.59 mg/dL — ABNORMAL LOW (ref 0.61–1.24)
GFR, Estimated: >60 mL/min (ref >60)
Glucose, Bld: 139 mg/dL — ABNORMAL HIGH (ref 70–99)
Potassium: 4.3 mmol/L (ref 3.5–5.1)
Sodium: 132 mmol/L — ABNORMAL LOW (ref 135–145)

## 2021-06-01 LAB — TROPONIN I (HIGH SENSITIVITY): Troponin I (High Sensitivity): 55 ng/L — ABNORMAL HIGH (ref <18)

## 2021-06-01 LAB — HEPARIN LEVEL (UNFRACTIONATED)
Heparin Unfractionated: <0.1 IU/mL — ABNORMAL LOW (ref 0.30–0.70)
Heparin Unfractionated: <0.1 IU/mL — ABNORMAL LOW (ref 0.30–0.70)

## 2021-06-01 SURGERY — LEFT HEART CATH AND CORONARY ANGIOGRAPHY
Anesthesia: LOCAL

## 2021-06-01 MED ORDER — ROSUVASTATIN CALCIUM 20 MG PO TABS
40.0000 mg | ORAL_TABLET | Freq: Every day | ORAL | Status: DC
  Administered 2021-06-02 – 2021-06-15 (×14): 40 mg via ORAL
  Filled 2021-06-01 (×15): qty 2

## 2021-06-01 MED ORDER — ROSUVASTATIN CALCIUM 20 MG PO TABS
20.0000 mg | ORAL_TABLET | Freq: Every day | ORAL | Status: DC
  Administered 2021-06-01: 20 mg via ORAL
  Filled 2021-06-01: qty 1

## 2021-06-01 MED ORDER — SODIUM CHLORIDE 0.9% FLUSH: 3.0000 mL | INTRAVENOUS | Status: DC | PRN

## 2021-06-01 MED ORDER — HEPARIN (PORCINE) IN NACL 1000-0.9 UT/500ML-% IV SOLN
INTRAVENOUS | Status: AC
  Filled 2021-06-01: qty 1000

## 2021-06-01 MED ORDER — MIDAZOLAM HCL 2 MG/2ML IJ SOLN
INTRAMUSCULAR | Status: AC
Start: 2021-06-01 — End: ?
  Filled 2021-06-01: qty 2

## 2021-06-01 MED ORDER — JUVEN PO PACK
1.0000 | PACK | Freq: Two times a day (BID) | ORAL | Status: DC
Start: 2021-06-02 — End: 2021-06-15
  Administered 2021-06-02 – 2021-06-14 (×13): 1 via ORAL
  Filled 2021-06-01 (×19): qty 1

## 2021-06-01 MED ORDER — ALUM & MAG HYDROXIDE-SIMETH 200-200-20 MG/5ML PO SUSP
30.0000 mL | ORAL | Status: DC | PRN
  Administered 2021-06-01 – 2021-06-02 (×2): 30 mL via ORAL
  Filled 2021-06-01 (×2): qty 30

## 2021-06-01 MED ORDER — HEPARIN BOLUS VIA INFUSION
2000.0000 [IU] | Freq: Once | INTRAVENOUS | Status: AC
  Administered 2021-06-01: 2000 [IU] via INTRAVENOUS
  Filled 2021-06-01: qty 2000

## 2021-06-01 MED ORDER — HEPARIN SODIUM (PORCINE) 1000 UNIT/ML IJ SOLN
INTRAMUSCULAR | Status: AC
  Filled 2021-06-01: qty 10

## 2021-06-01 MED ORDER — MIDAZOLAM HCL 2 MG/2ML IJ SOLN
INTRAMUSCULAR | Status: DC | PRN
  Administered 2021-06-01: 1 mg via INTRAVENOUS

## 2021-06-01 MED ORDER — LABETALOL HCL 5 MG/ML IV SOLN: 10.0000 mg | INTRAVENOUS | Status: AC | PRN

## 2021-06-01 MED ORDER — HYDROMORPHONE HCL 1 MG/ML IJ SOLN
0.5000 mg | INTRAMUSCULAR | Status: AC
  Administered 2021-06-01: 0.5 mg via INTRAVENOUS
  Filled 2021-06-01: qty 1

## 2021-06-01 MED ORDER — SODIUM CHLORIDE 0.9 % IV SOLN: INTRAVENOUS | Status: AC

## 2021-06-01 MED ORDER — LIDOCAINE HCL (PF) 1 % IJ SOLN
INTRAMUSCULAR | Status: DC | PRN
  Administered 2021-06-01: 2 mL

## 2021-06-01 MED ORDER — CLOPIDOGREL BISULFATE 75 MG PO TABS
75.0000 mg | ORAL_TABLET | Freq: Every day | ORAL | Status: DC
  Administered 2021-06-01 – 2021-06-03 (×3): 75 mg via ORAL
  Filled 2021-06-01 (×3): qty 1

## 2021-06-01 MED ORDER — FENTANYL CITRATE (PF) 100 MCG/2ML IJ SOLN
INTRAMUSCULAR | Status: AC
  Filled 2021-06-01: qty 2

## 2021-06-01 MED ORDER — SODIUM CHLORIDE 0.9% FLUSH
3.0000 mL | Freq: Two times a day (BID) | INTRAVENOUS | Status: DC
  Administered 2021-06-02 (×2): 3 mL via INTRAVENOUS

## 2021-06-01 MED ORDER — HYDRALAZINE HCL 20 MG/ML IJ SOLN: 10.0000 mg | INTRAMUSCULAR | Status: AC | PRN

## 2021-06-01 MED ORDER — SODIUM CHLORIDE 0.9 % WEIGHT BASED INFUSION: 1.0000 mL/kg/h | INTRAVENOUS | Status: DC

## 2021-06-01 MED ORDER — SODIUM CHLORIDE 0.9 % WEIGHT BASED INFUSION
3.0000 mL/kg/h | INTRAVENOUS | Status: DC
  Administered 2021-06-01: 3 mL/kg/h via INTRAVENOUS

## 2021-06-01 MED ORDER — ENSURE ENLIVE PO LIQD
237.0000 mL | Freq: Three times a day (TID) | ORAL | Status: DC
  Administered 2021-06-02 – 2021-06-15 (×23): 237 mL via ORAL
  Filled 2021-06-01: qty 237

## 2021-06-01 MED ORDER — SODIUM CHLORIDE 0.9% FLUSH
3.0000 mL | Freq: Two times a day (BID) | INTRAVENOUS | Status: DC
  Administered 2021-06-01 – 2021-06-02 (×2): 3 mL via INTRAVENOUS

## 2021-06-01 MED ORDER — HEPARIN SODIUM (PORCINE) 5000 UNIT/ML IJ SOLN
5000.0000 [IU] | Freq: Three times a day (TID) | INTRAMUSCULAR | Status: DC
  Administered 2021-06-02 – 2021-06-03 (×5): 5000 [IU] via SUBCUTANEOUS
  Filled 2021-06-01 (×5): qty 1

## 2021-06-01 MED ORDER — METOPROLOL TARTRATE 12.5 MG HALF TABLET
12.5000 mg | ORAL_TABLET | Freq: Two times a day (BID) | ORAL | Status: DC
  Administered 2021-06-01 – 2021-06-03 (×5): 12.5 mg via ORAL
  Filled 2021-06-01 (×5): qty 1

## 2021-06-01 MED ORDER — VERAPAMIL HCL 2.5 MG/ML IV SOLN
INTRAVENOUS | Status: DC | PRN
  Administered 2021-06-01: 10 mL via INTRA_ARTERIAL

## 2021-06-01 MED ORDER — HEPARIN (PORCINE) IN NACL 1000-0.9 UT/500ML-% IV SOLN
INTRAVENOUS | Status: DC | PRN
  Administered 2021-06-01 (×2): 500 mL

## 2021-06-01 MED ORDER — SODIUM CHLORIDE 0.9 % IV SOLN: 250.0000 mL | INTRAVENOUS | Status: DC | PRN

## 2021-06-01 MED ORDER — ADULT MULTIVITAMIN W/MINERALS CH
1.0000 | ORAL_TABLET | Freq: Every day | ORAL | Status: DC
  Administered 2021-06-01 – 2021-06-15 (×15): 1 via ORAL
  Filled 2021-06-01 (×15): qty 1

## 2021-06-01 MED ORDER — HEPARIN SODIUM (PORCINE) 1000 UNIT/ML IJ SOLN
INTRAMUSCULAR | Status: DC | PRN
  Administered 2021-06-01: 3000 [IU] via INTRAVENOUS

## 2021-06-01 MED ORDER — VERAPAMIL HCL 2.5 MG/ML IV SOLN
INTRAVENOUS | Status: AC
  Filled 2021-06-01: qty 2

## 2021-06-01 MED ORDER — DOXYCYCLINE HYCLATE 100 MG PO TABS
100.0000 mg | ORAL_TABLET | Freq: Two times a day (BID) | ORAL | Status: AC
  Administered 2021-06-01 – 2021-06-06 (×9): 100 mg via ORAL
  Filled 2021-06-01 (×9): qty 1

## 2021-06-01 MED ORDER — LIDOCAINE HCL (PF) 1 % IJ SOLN
INTRAMUSCULAR | Status: AC
  Filled 2021-06-01: qty 30

## 2021-06-01 MED ORDER — FENTANYL CITRATE (PF) 100 MCG/2ML IJ SOLN
INTRAMUSCULAR | Status: DC | PRN
  Administered 2021-06-01: 25 ug via INTRAVENOUS

## 2021-06-01 MED ORDER — IOHEXOL 350 MG/ML SOLN
INTRAVENOUS | Status: DC | PRN
  Administered 2021-06-01: 45 mL

## 2021-06-01 SURGICAL SUPPLY — 12 items
BAND CMPR LRG ZPHR (HEMOSTASIS) ×1
BAND ZEPHYR COMPRESS 30 LONG (HEMOSTASIS) ×1 IMPLANT
CATH DIAG 6FR JR4 (CATHETERS) ×1 IMPLANT
CATH INFINITI 5 FR JL3.5 (CATHETERS) ×1 IMPLANT
ELECT DEFIB PAD ADLT CADENCE (PAD) ×1 IMPLANT
GLIDESHEATH SLEND SS 6F .021 (SHEATH) ×1 IMPLANT
GUIDEWIRE INQWIRE 1.5J.035X260 (WIRE) IMPLANT
INQWIRE 1.5J .035X260CM (WIRE) ×2
KIT HEART LEFT (KITS) ×3 IMPLANT
PACK CARDIAC CATHETERIZATION (CUSTOM PROCEDURE TRAY) ×3 IMPLANT
TRANSDUCER W/STOPCOCK (MISCELLANEOUS) ×3 IMPLANT
TUBING CIL FLEX 10 FLL-RA (TUBING) ×3 IMPLANT

## 2021-06-01 NOTE — Progress Notes (Signed)
?  Echocardiogram ?2D Echocardiogram has been performed. ? ?Johny Chess ?06/01/2021, 5:05 PM ?

## 2021-06-01 NOTE — Progress Notes (Signed)
ANTICOAGULATION CONSULT NOTE  ?Pharmacy Consult for heparin ?Indication: chest pain/ACS ? ?Allergies  ?Allergen Reactions  ? Morphine And Related Shortness Of Breath and Palpitations  ? Penicillins Anaphylaxis  ? Vicodin [Hydrocodone-Acetaminophen] Shortness Of Breath and Palpitations  ? Latex Hives and Rash  ? ? ?Patient Measurements: ?Height: '5\' 7"'$  (170.2 cm) ?Weight: 60.6 kg (133 lb 11.2 oz) ?IBW/kg (Calculated) : 66.1 ?Heparin Dosing Weight: 59kg ? ?Vital Signs: ?Temp: 98.6 ?F (37 ?C) (03/24 5366) ?Temp Source: Oral (03/24 4403) ?BP: 121/76 (03/24 0813) ?Pulse Rate: 98 (03/24 0813) ? ?Labs: ?Recent Labs  ?  05/31/21 ?2145 05/31/21 ?2356 06/01/21 ?4742 06/01/21 ?0803  ?HGB  --   --  9.3*  --   ?HCT  --   --  32.0*  --   ?PLT  --   --  369  --   ?HEPARINUNFRC  --  <0.10*  --  <0.10*  ?CREATININE  --   --  0.59*  --   ?TROPONINIHS 55* 55*  --   --   ? ? ?Estimated Creatinine Clearance: 84.2 mL/min (A) (by C-G formula based on SCr of 0.59 mg/dL (L)). ? ? ?Medical History: ?Past Medical History:  ?Diagnosis Date  ? Anxiety   ? Back pain   ? Bipolar disorder ([place])   ? Borderline hypertension   ? Chronic pain   ? COPD (chronic obstructive pulmonary disease) ([place])   ? Depression with anxiety   ? GERD (gastroesophageal reflux disease)   ? Headache   ? Hip pain   ? Hypercholesterolemia   ? Stroke [facility])   ? Tobacco use   ? ?Assessment: ?Pt presented to [facility] with CP. Heparin was started there before transferring here for further evaluation. No anticoagulation prior to admission.  ? ?Heparin level undetectable on recheck. No bleeding or IV issues noted.  ? ?Goal of Therapy:  ?Heparin level 0.3-0.7 units/ml ?Monitor platelets by anticoagulation protocol: Yes ?  ?Plan:  ?Increase heparin to 1350 units/hr ?Recheck heparin level in 6 hours ? ?Erin Hearing PharmD., BCPS ?Clinical Pharmacist ?06/01/2021 9:36 AM ? ?

## 2021-06-01 NOTE — Assessment & Plan Note (Addendum)
Present on admission.  ?Stage I sacral and stage II Left lower buttock ?Continue foam dressing.   ?

## 2021-06-01 NOTE — H&P (View-Only) (Signed)
?Cardiology Consultation:  ? ?Raymond [name] ID: [name] ?MRN: [id number]; DOB: Aug 14, 1960 ? ?Admit date: 05/31/2021 ?Date of Consult: 06/01/2021 ? ?PCP:  [name], MD ?  ?[facility] ?Cardiologist:  Carlyle Dolly, MD   }   ? ? ?Raymond [name] Profile:  ? ?Raymond [name] is a 61 y.o. male with a hx of CAD s/p MI and PCI, COPD, chronic respiratory failure on home O2 at 2 L/min, GERD, bipolar disorder, chronic pain, heavy tobacco abuse, PVD, type 2 DM who is being seen 06/01/2021 for the evaluation of chest pain at the request of Dr. Posey Pronto. ? ?History of Present Illness:  ? ?Raymond [name] is a 61 year old male with above medical history. Per chart review, Raymond [name] has a remote history of MI with stent placement (prior to 2010, no records available). Raymond [name] is followed by Dr. Harl Bowie and established care in January 2022 for evaluation of palpitations. Raymond [name] wore a heart monitor for 2 days that showed overall rare supraventricular and ventricular ectomy. Raymond [name] was last seen on 06/19/20 and was doing well at that time. Raymond [name] has a significant history of PVD. Underwent right femoral artery endarterectomy with angioplasty, stenting of the right common iliac artery, right femoral artery bypass, and amputation of right fifth toe in 11/2020.  ? ?Raymond [name] presented to the [facility] ED on 3/23 complaining of chest pain that radiated to his left arm and SOB. Labs showed hsTn 90>>107. BNP elevated to 4571. CXR showed possible small left pleural effusion and borderline cardiomegaly. Chest pain was not relieved with SL nitro. Raymond [name] was started on IV heparin and transferred to [facility] for further evaluation.  ? ?Raymond [name] was admitted to [facility] showed Na 132, K 4.3, creatinine 0.59, hemoglobin 9.3, WBC 7.1, platelets 369. hsTn 55>>55. EKG showed normal sinus rhythm with a HR of 100.  ? ?On interview, Raymond [name] reports that he has been "going downhill" for the past 3-4 weeks. He has been  experiencing increased SOB and chest pain on exertion. Symptoms would resolve with rest. However, yesterday morning, Raymond [name] developed more intense chest pain while he was sitting and watching TV. Chest pain is described as pressure with occasional increases in intensity. Sometimes radiates to his arms. Described as similar to the pain he had with his previous heart attack. Pain not relieved with rest or nitroglycerine. Has also had poor healing of right 5th toe amputation site. Raymond [name] smokes 1.5 packs of cigarettes per day, has smoked for the past 30 years.  ? ?Past Medical History:  ?Diagnosis Date  ? Anxiety   ? Back pain   ? Bipolar disorder ([place])   ? Borderline hypertension   ? Chronic pain   ? COPD (chronic obstructive pulmonary disease) ([place])   ? Depression with anxiety   ? GERD (gastroesophageal reflux disease)   ? Headache   ? Hip pain   ? Hypercholesterolemia   ? Stroke [facility])   ? Tobacco use   ? ? ?Past Surgical History:  ?Procedure Laterality Date  ? AMPUTATION Right 12/08/2020  ? Procedure: RAY AMPUTATION OF RIGHT FIFTH TOE;  Surgeon: Angelia Mould, MD;  Location: [facility] OR;  Service: Vascular;  Laterality: Right;  ? ANGIOPLASTY ILLIAC ARTERY Right 12/08/2020  ? Procedure: ILIAC ANGIOPLASTY WITH INSERTION OF RIGHT COMMON ILIAC 8MM X 79MM VIABAHN STENT;  Surgeon: Angelia Mould, MD;  Location: [facility];  Service: Vascular;  Laterality: Right;  ? ANTERIOR FUSION CERVICAL SPINE    ? BACK SURGERY    ?  CHOLECYSTECTOMY    ? COLONOSCOPY N/A 10/28/2012  ? ONG:EXBMWU polyp-hyperplastic. Internal hemorrhoids. Distal 5 cm of terminal ileum appeared normal.  ? CORONARY ANGIOPLASTY WITH STENT PLACEMENT  1980s  ? ENDARTERECTOMY FEMORAL Right 12/08/2020  ? Procedure: RIGHT FEMORAL ENDARTERECTOMY WITH ILIOFEMORAL THROMBECTOMY;  Surgeon: Angelia Mould, MD;  Location: [facility] OR;  Service: Vascular;  Laterality: Right;  ? ESOPHAGOGASTRODUODENOSCOPY N/A 10/28/2012  ? XLK:[id number] gastric mucosa with mottling and  submucosal petechiae. Mild chronic gastritis but no H. pylori home path. Small hiatal hernia. Duodenal lipoma  ? EYE SURGERY  Left eye  ? as a child  ? FEMORAL-POPLITEAL BYPASS GRAFT Right 12/08/2020  ? Procedure: RIGHT FEMORAL-BELOW KNEE POPLITEAL ARTERY BYPASS GRAFT;  Surgeon: Angelia Mould, MD;  Location: [facility] OR;  Service: Vascular;  Laterality: Right;  ? LUMBAR SPINE SURGERY    ? 3 x   ?  ? ?Home Medications:  ?Prior to Admission medications   ?Medication Sig Start Date End Date Taking? Authorizing Provider  ?albuterol (ACCUNEB) 0.63 MG/3ML nebulizer solution Take 1 ampule by nebulization every 6 (six) hours as needed for wheezing or shortness of breath. 10/13/12  Yes [provider]  ?albuterol (PROVENTIL HFA;VENTOLIN HFA) 108 (90 BASE) MCG/ACT inhaler Inhale 2 puffs into the lungs every 6 (six) hours as needed for wheezing.   Yes [provider]  ?allopurinol (ZYLOPRIM) 100 MG tablet Take 100 mg by mouth in the morning. 10/13/12  Yes [provider]  ?aspirin 81 MG tablet Take 81 mg by mouth daily.   Yes [provider]  ?buPROPion (WELLBUTRIN XL) 150 MG 24 hr tablet Take 1 tablet by mouth daily at 6 (six) AM. 05/09/20  Yes [provider]  ?calcium carbonate (TUMS EX) 750 MG chewable tablet Chew 2 tablets by mouth as needed for heartburn.   Yes [provider]  ?clopidogrel (PLAVIX) 75 MG tablet Take 1 tablet (75 mg total) by mouth daily at 6 (six) AM. 12/14/20  Yes Georgette Shell, MD  ?diclofenac (VOLTAREN) 50 MG EC tablet Take 50 mg by mouth 3 (three) times daily. 02/07/20  Yes [provider]  ?gabapentin (NEURONTIN) 300 MG capsule Take 300 mg by mouth 3 (three) times daily.   Yes [provider]  ?Magnesium Hydroxide (DULCOLAX SOFT CHEWS) 1200 MG CHEW Chew 1 tablet by mouth as needed (constipation).   Yes [provider]  ?magnesium oxide (MAG-OX) 400 MG tablet Take 1 tablet by mouth daily. 11/27/20  Yes [provider]  ?metFORMIN (GLUCOPHAGE) 500 MG tablet Take 2 tablets by mouth 2 (two) times daily.   Yes [provider]  ?metoprolol tartrate (LOPRESSOR) 25 MG tablet TAKE 1/2 TABLET BY MOUTH TWICE DAILY Needs appointment for further refills ?Raymond [name] taking differently: Take 12.5 mg by mouth 2 (two) times daily. 05/28/21  Yes BranchAlphonse Guild, MD  ?omeprazole (PRILOSEC) 40 MG capsule Take 1 capsule by mouth daily at 6 (six) AM. 05/09/20  Yes [provider]  ?ondansetron (ZOFRAN) 4 MG tablet Take 4 mg by mouth 3 (three) times daily as needed. 09/27/20  Yes [provider]  ?oxyCODONE-acetaminophen (PERCOCET) 10-325 MG per tablet Take 1-2 tablets by mouth 4 (four) times daily as needed for pain. max FIVE PER DAY 09/24/12  Yes [provider]  ?rosuvastatin (CRESTOR) 10 MG tablet Take 2 tablets (20 mg total) by mouth at bedtime. 12/13/20  Yes Georgette Shell, MD  ?tamsulosin (FLOMAX) 0.4 MG CAPS capsule Take 0.4 mg by mouth in the  morning.   Yes [provider]  ?ibuprofen (ADVIL) 600 MG tablet Take 600 mg by mouth every 6 (six) hours as needed for moderate pain or mild pain.    [provider]  ?silver sulfADIAZINE (SILVADENE) 1 % cream Apply 1 application topically 2 (two) times daily. apply a 1/16 inch (1.5 mm) thick layer to entire burn area 11/21/20   [provider]  ?sulfamethoxazole-trimethoprim (BACTRIM DS) 800-160 MG tablet Take 1 tablet by mouth 2 (two) times daily. ?Raymond [name] not taking: Reported on 05/31/2021 12/13/20   Georgette Shell, MD  ? ? ?Inpatient Medications: ?Scheduled Meds: ? aspirin  324 mg Oral NOW  ? Or  ? aspirin  300 mg Rectal NOW  ? aspirin EC  81 mg Oral Daily  ? guaiFENesin  600 mg Oral BID  ? insulin aspart  0-15 Units Subcutaneous Q4H  ? ipratropium-albuterol  3 mL Nebulization QID  ? nicotine  21 mg Transdermal Daily  ? nitroGLYCERIN  1 inch Topical Q6H  ? [START ON 06/02/2021] predniSONE  40 mg Oral Q breakfast  ?  rosuvastatin  20 mg Oral Daily  ? ?Continuous Infusions: ? sodium chloride 100 mL/hr at 06/01/21 0419  ? heparin 1,350 Units/hr (06/01/21 1059)  ? ?PRN Meds: ?acetaminophen, ALPRAZolam, fentaNYL (SUBLIMAZE) injection, m

## 2021-06-01 NOTE — Hospital Course (Addendum)
Past medical history of CAD SP PCI, COPD, chronic respiratory failure, GERD, active smoker, PVD, type II DM.  Presents with complaints of chest pain and elevated troponin as well as COPD exacerbation. ?Cardiology was consulted.  Underwent cardiac catheterization on 3/24 which showed nonobstructive CAD.  Medical management recommended. Patient developed Malena, associated with hypotension and tachycardia.  ?GI consulted and he was started on PPI GTT and transferred to ICU for hypotension and possible pressors.  ?Pt did not require vasopressors and he was transferred to [facility] on 06/05/21.  ?Patient underwent EGD on 3/29 that showed large cratered ulcer in distal third of stomach ?Hemoglobin has been stable and respiratory status appears to be at baseline ?He complained of weakness and ataxia,  ?MRI brain shows small incidental infarct.  Seen by neurology with recommendations for dual antiplatelet therapy when cleared by neurosurgery/GI  ?MRI C spine shows severe spinal stenosis with mass effect on cord ?Seen by neurosurgery with plans for operative management on 4/4 which was consulted to preop hypotension. ?

## 2021-06-01 NOTE — Progress Notes (Signed)
ANTICOAGULATION CONSULT NOTE  ?Pharmacy Consult for heparin ?Indication: chest pain/ACS ?Brief A/P: Heparin level subtherapeutic Increase Heparin rate ? ? ?Allergies  ?Allergen Reactions  ? Morphine And Related Shortness Of Breath and Palpitations  ? Penicillins Anaphylaxis  ? Vicodin [Hydrocodone-Acetaminophen] Shortness Of Breath and Palpitations  ? Latex Hives and Rash  ? ? ?Patient Measurements: ?Height: '5\' 7"'$  (170.2 cm) ?Weight: 60.6 kg (133 lb 11.2 oz) ?IBW/kg (Calculated) : 66.1 ?Heparin Dosing Weight: 59kg ? ?Vital Signs: ?Temp: 98.6 ?F (37 ?C) (03/23 2342) ?Temp Source: Oral (03/23 2342) ?BP: 146/77 (03/24 0011) ?Pulse Rate: 105 (03/24 0011) ? ?Labs: ?Recent Labs  ?  05/31/21 ?2145 05/31/21 ?2356  ?HEPARINUNFRC  --  <0.10*  ?TROPONINIHS 55*  --   ? ? ?CrCl cannot be calculated (Patient's most recent lab result is older than the maximum 21 days allowed.). ? ?Assessment: ?61 y.o. male with chest pain for heparin ? ?Heparin level 0.3-0.7 units/ml ?Monitor platelets by anticoagulation protocol: Yes ?  ?Plan:  ?Heparin 2000 units IV bolus, then increase heparin 1150 units/hr ?Check heparin level in 6 hours.  ? ?Phillis Knack, PharmD, BCPS  ?06/01/2021 1:46 AM ? ? ? ?

## 2021-06-01 NOTE — Consult Note (Addendum)
?Cardiology Consultation:  ? ?Patient ID: Raymond [name] ?MRN: [id number]; DOB: 1960-12-26 ? ?Admit date: 05/31/2021 ?Date of Consult: 06/01/2021 ? ?PCP:  [name], MD ?  ?[facility] Providers ?Cardiologist:  Carlyle Dolly, MD   }   ? ? ?Patient Profile:  ? ?Raymond [name] is a 61 y.o. male with a hx of CAD s/p MI and PCI, COPD, chronic respiratory failure on home O2 at 2 L/min, GERD, bipolar disorder, chronic pain, heavy tobacco abuse, PVD, type 2 DM who is being seen 06/01/2021 for the evaluation of chest pain at the request of Dr. Posey Pronto. ? ?History of Present Illness:  ? ?Raymond [name] is a 61 year old male with above medical history. Per chart review, patient has a remote history of MI with stent placement (prior to 2010, no records available). Patient is followed by Dr. Harl Bowie and established care in January 2022 for evaluation of palpitations. Patient wore a heart monitor for 2 days that showed overall rare supraventricular and ventricular ectomy. Patient was last seen on 06/19/20 and was doing well at that time. Patient has a significant history of PVD. Underwent right femoral artery endarterectomy with angioplasty, stenting of the right common iliac artery, right femoral artery bypass, and amputation of right fifth toe in 11/2020.  ? ?Patient presented to the [facility] ED on 3/23 complaining of chest pain that radiated to his left arm and SOB. Labs showed hsTn 90>>107. BNP elevated to 4571. CXR showed possible small left pleural effusion and borderline cardiomegaly. Chest pain was not relieved with SL nitro. Patient was started on IV heparin and transferred to [facility] for further evaluation.  ? ?Patient was admitted to [place] showed Na 132, K 4.3, creatinine 0.59, hemoglobin 9.3, WBC 7.1, platelets 369. hsTn 55>>55. EKG showed normal sinus rhythm with a HR of 100.  ? ?On interview, patient reports that he has been "going downhill" for the past 3-4 weeks. He has been  experiencing increased SOB and chest pain on exertion. Symptoms would resolve with rest. However, yesterday morning, patient developed more intense chest pain while he was sitting and watching TV. Chest pain is described as pressure with occasional increases in intensity. Sometimes radiates to his arms. Described as similar to the pain he had with his previous heart attack. Pain not relieved with rest or nitroglycerine. Has also had poor healing of right 5th toe amputation site. Patient smokes 1.5 packs of cigarettes per day, has smoked for the past 30 years.  ? ?Past Medical History:  ?Diagnosis Date  ? Anxiety   ? Back pain   ? Bipolar disorder ([place])   ? Borderline hypertension   ? Chronic pain   ? COPD (chronic obstructive pulmonary disease) ([place])   ? Depression with anxiety   ? GERD (gastroesophageal reflux disease)   ? Headache   ? Hip pain   ? Hypercholesterolemia   ? Stroke [facility])   ? Tobacco use   ? ? ?Past Surgical History:  ?Procedure Laterality Date  ? AMPUTATION Right 12/08/2020  ? Procedure: RAY AMPUTATION OF RIGHT FIFTH TOE;  Surgeon: Angelia Mould, MD;  Location: [facility] OR;  Service: Vascular;  Laterality: Right;  ? ANGIOPLASTY ILLIAC ARTERY Right 12/08/2020  ? Procedure: ILIAC ANGIOPLASTY WITH INSERTION OF RIGHT COMMON ILIAC 8MM X 79MM VIABAHN STENT;  Surgeon: Angelia Mould, MD;  Location: [facility];  Service: Vascular;  Laterality: Right;  ? ANTERIOR FUSION CERVICAL SPINE    ? BACK SURGERY    ?  CHOLECYSTECTOMY    ? COLONOSCOPY N/A 10/28/2012  ? OBS:JGGEZM polyp-hyperplastic. Internal hemorrhoids. Distal 5 cm of terminal ileum appeared normal.  ? CORONARY ANGIOPLASTY WITH STENT PLACEMENT  1980s  ? ENDARTERECTOMY FEMORAL Right 12/08/2020  ? Procedure: RIGHT FEMORAL ENDARTERECTOMY WITH ILIOFEMORAL THROMBECTOMY;  Surgeon: Angelia Mould, MD;  Location: [facility] OR;  Service: Vascular;  Laterality: Right;  ? ESOPHAGOGASTRODUODENOSCOPY N/A 10/28/2012  ? OQH:[id number] gastric mucosa with mottling and  submucosal petechiae. Mild chronic gastritis but no H. pylori home path. Small hiatal hernia. Duodenal lipoma  ? EYE SURGERY  Left eye  ? as a child  ? FEMORAL-POPLITEAL BYPASS GRAFT Right 12/08/2020  ? Procedure: RIGHT FEMORAL-BELOW KNEE POPLITEAL ARTERY BYPASS GRAFT;  Surgeon: Angelia Mould, MD;  Location: [facility] OR;  Service: Vascular;  Laterality: Right;  ? LUMBAR SPINE SURGERY    ? 3 x   ?  ? ?Home Medications:  ?Prior to Admission medications   ?Medication Sig Start Date End Date Taking? Authorizing Provider  ?albuterol (ACCUNEB) 0.63 MG/3ML nebulizer solution Take 1 ampule by nebulization every 6 (six) hours as needed for wheezing or shortness of breath. 10/13/12  Yes [provider]  ?albuterol (PROVENTIL HFA;VENTOLIN HFA) 108 (90 BASE) MCG/ACT inhaler Inhale 2 puffs into the lungs every 6 (six) hours as needed for wheezing.   Yes [provider]  ?allopurinol (ZYLOPRIM) 100 MG tablet Take 100 mg by mouth in the morning. 10/13/12  Yes [provider]  ?aspirin 81 MG tablet Take 81 mg by mouth daily.   Yes [provider]  ?buPROPion (WELLBUTRIN XL) 150 MG 24 hr tablet Take 1 tablet by mouth daily at 6 (six) AM. 05/09/20  Yes [provider]  ?calcium carbonate (TUMS EX) 750 MG chewable tablet Chew 2 tablets by mouth as needed for heartburn.   Yes [provider]  ?clopidogrel (PLAVIX) 75 MG tablet Take 1 tablet (75 mg total) by mouth daily at 6 (six) AM. 12/14/20  Yes Georgette Shell, MD  ?diclofenac (VOLTAREN) 50 MG EC tablet Take 50 mg by mouth 3 (three) times daily. 02/07/20  Yes [provider]  ?gabapentin (NEURONTIN) 300 MG capsule Take 300 mg by mouth 3 (three) times daily.   Yes [provider]  ?Magnesium Hydroxide (DULCOLAX SOFT CHEWS) 1200 MG CHEW Chew 1 tablet by mouth as needed (constipation).   Yes [provider]  ?magnesium oxide (MAG-OX) 400 MG tablet Take 1 tablet by mouth daily. 11/27/20  Yes [provider]  ?metFORMIN (GLUCOPHAGE) 500 MG tablet Take 2 tablets by mouth 2 (two) times daily.   Yes [provider]  ?metoprolol tartrate (LOPRESSOR) 25 MG tablet TAKE 1/2 TABLET BY MOUTH TWICE DAILY Needs appointment for further refills ?Patient taking differently: Take 12.5 mg by mouth 2 (two) times daily. 05/28/21  Yes BranchAlphonse Guild, MD  ?omeprazole (PRILOSEC) 40 MG capsule Take 1 capsule by mouth daily at 6 (six) AM. 05/09/20  Yes [provider]  ?ondansetron (ZOFRAN) 4 MG tablet Take 4 mg by mouth 3 (three) times daily as needed. 09/27/20  Yes [provider]  ?oxyCODONE-acetaminophen (PERCOCET) 10-325 MG per tablet Take 1-2 tablets by mouth 4 (four) times daily as needed for pain. max FIVE PER DAY 09/24/12  Yes [provider]  ?rosuvastatin (CRESTOR) 10 MG tablet Take 2 tablets (20 mg total) by mouth at bedtime. 12/13/20  Yes Georgette Shell, MD  ?tamsulosin (FLOMAX) 0.4 MG CAPS capsule Take 0.4 mg by mouth in the  morning.   Yes [provider]  ?ibuprofen (ADVIL) 600 MG tablet Take 600 mg by mouth every 6 (six) hours as needed for moderate pain or mild pain.    [provider]  ?silver sulfADIAZINE (SILVADENE) 1 % cream Apply 1 application topically 2 (two) times daily. apply a 1/16 inch (1.5 mm) thick layer to entire burn area 11/21/20   [provider]  ?sulfamethoxazole-trimethoprim (BACTRIM DS) 800-160 MG tablet Take 1 tablet by mouth 2 (two) times daily. ?Patient not taking: Reported on 05/31/2021 12/13/20   Georgette Shell, MD  ? ? ?Inpatient Medications: ?Scheduled Meds: ? aspirin  324 mg Oral NOW  ? Or  ? aspirin  300 mg Rectal NOW  ? aspirin EC  81 mg Oral Daily  ? guaiFENesin  600 mg Oral BID  ? insulin aspart  0-15 Units Subcutaneous Q4H  ? ipratropium-albuterol  3 mL Nebulization QID  ? nicotine  21 mg Transdermal Daily  ? nitroGLYCERIN  1 inch Topical Q6H  ? [START ON 06/02/2021] predniSONE  40 mg Oral Q breakfast  ?  rosuvastatin  20 mg Oral Daily  ? ?Continuous Infusions: ? sodium chloride 100 mL/hr at 06/01/21 0419  ? heparin 1,350 Units/hr (06/01/21 1059)  ? ?PRN Meds: ?acetaminophen, ALPRAZolam, fentaNYL (SUBLIMAZE) injection, m

## 2021-06-01 NOTE — TOC Initial Note (Addendum)
Transition of Care (TOC) - Initial/Assessment Note  ? ? ?Patient Details  ?Name: Raymond [name] ?MRN: [id number] ?Date of Birth: 16-Aug-1960 ? ?Transition of Care (TOC) CM/SW Contact:    ?Milas Gain, LCSWA ?Phone Number: ?06/01/2021, 3:42 PM ? ?Clinical Narrative:                 ? ?CSW received consult for possible SNF placement at time of discharge. CSW spoke with patient at bedside regarding PT recommendation of SNF placement at time of discharge. Patient reports he comes from  home alone. Patient expressed understanding of PT recommendation and is agreeable to SNF placement at time of discharge. Patient gave CSW permission to fax out initial referral near the [place] area.CSW discussed insurance authorization process with patient and will provide patient with medicare compare ratings list with accepted SNF bed offers when available. Patient reports he has received the COVID vaccines as well as 1 booster. Patient expressed being hopeful for rehab and to feel better soon. No further questions reported at this time. CSW to continue to follow and assist with discharge planning needs.  ? ?Update- Patients passr is under review. CSW submitted requested clinicals to [place] must for review. ? ?Expected Discharge Plan: [place] ?Barriers to Discharge: Continued Medical Work up ? ? ?Patient Goals and CMS Choice ?Patient states their goals for this hospitalization and ongoing recovery are:: SNF ?CMS Medicare.gov Compare Post Acute Care list provided to:: Patient ?Choice offered to / list presented to : Patient ? ?Expected Discharge Plan and Services ?Expected Discharge Plan: [place] ?In-house Referral: Clinical Social Work ?  ?  ?Living arrangements for the past 2 months: [place] ?                ?  ?  ?  ?  ?  ?  ?  ?  ?  ?  ? ?Prior Living Arrangements/Services ?Living arrangements for the past 2 months: [place] ?Lives with:: Self ?Patient language and need for  interpreter reviewed:: Yes ?Do you feel safe going back to the place where you live?: No   SNF  ?Need for Family Participation in Patient Care: Yes (Comment) ?Care giver support system in place?: Yes (comment) ?  ?Criminal Activity/Legal Involvement Pertinent to Current Situation/Hospitalization: No - Comment as needed ? ?Activities of Daily Living ?  ?  ? ?Permission Sought/Granted ?Permission sought to share information with : Case Manager, Customer service manager, Family Supports ?Permission granted to share information with : Yes, Verbal Permission Granted ? Share Information with NAME: Raymond [name] ? Permission granted to share info w AGENCY: SNF ? Permission granted to share info w Relationship: Daughter ? Permission granted to share info w Contact Information: Raymond [name] [phone] ? ?Emotional Assessment ?Appearance:: Appears stated age ?Attitude/Demeanor/Rapport: Gracious ?Affect (typically observed): Calm ?Orientation: : Oriented to Self, Oriented to Place, Oriented to  Time, Oriented to Situation ?Alcohol / Substance Use: Not Applicable ?Psych Involvement: No (comment) ? ?Admission diagnosis:  NSTEMI (non-ST elevated myocardial infarction) ([place]) [I21.4] ?Acute and chronic respiratory failure with hypoxia ([place]) [J96.21] ?Patient Active Problem List  ? Diagnosis Date Noted  ? Pressure injury of skin 06/01/2021  ? Protein-calorie malnutrition, severe 06/01/2021  ? Acute and chronic respiratory failure with hypoxia ([place]) 06/01/2021  ? NSTEMI (non-ST elevated myocardial infarction) ([place]) 05/31/2021  ? Dyslipidemia 05/31/2021  ? GERD without esophagitis 05/31/2021  ? Delayed surgical wound healing of toe amputation stump ([place]) 05/31/2021  ? Depression 12/06/2020  ?  Hyperlipidemia 12/06/2020  ? Tobacco use 12/06/2020  ? Peripheral vascular disease of lower extremity with ulceration ([place]) 12/06/2020  ? Occlusion of right iliac artery ([place]) 12/06/2020  ? Dry gangrene ([place]) 12/06/2020  ? Chronic respiratory failure  ([place]) 12/06/2020  ? Gastric wall thickening 08/02/2020  ? Drug-induced constipation 08/02/2020  ? Moderate protein malnutrition ([place]) 07/21/2020  ? COPD with acute exacerbation ([place]) 06/22/2017  ? Hyperglycemia 06/22/2017  ? Anxiety 06/22/2017  ? Type 2 diabetes mellitus without complication ([place]) 16/03/930  ? COPD (chronic obstructive pulmonary disease) ([place]) 06/22/2017  ? 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 CVA (cerebral infarction) 11/10/2013  ? Chest pain 04/05/2013  ? OA (osteoarthritis) of knee 02/24/2013  ? Chronic pain syndrome 02/24/2013  ? Spinal stenosis, lumbar region, with neurogenic claudication 02/24/2013  ? Gastritis 11/11/2012  ? Abnormal weight loss 11/11/2012  ? Gastroesophageal reflux disease 10/15/2012  ? Abdominal pain, other specified site 10/15/2012  ? ?PCP:  [name], MD ?Pharmacy:   ?[facility] - [place], [place] - [address] ?[address] ?[place] [address] ?Phone: [phone] Fax: [phone] ? ? ? ? ?Social Determinants of Health (SDOH) Interventions ?  ? ?Readmission Risk Interventions ? ?  12/13/2020  ?  2:02 PM  ?Readmission Risk Prevention Plan  ?Transportation Screening Complete  ?PCP or Specialist Appt within 5-7 Days Complete  ?Home Care Screening Complete  ?Medication Review (RN CM) Complete  ? ? ? ?

## 2021-06-01 NOTE — Progress Notes (Signed)
Initial Nutrition Assessment ? ?DOCUMENTATION CODES:  ?Severe malnutrition in context of chronic illness ? ?INTERVENTION:  ?Advance to regular diet as able ?Ensure Enlive po TID, each supplement provides 350 kcal and 20 grams of protein. ?1 packet Juven BID, each packet provides 95 calories, 2.5 grams of protein (collagen), and 9.8 grams of carbohydrate (3 grams sugar); also contains 7 grams of L-arginine and L-glutamine, 300 mg vitamin C, 15 mg vitamin E, 1.2 mcg vitamin B-12, 9.5 mg zinc, 200 mg calcium, and 1.5 g  Calcium Beta-hydroxy-Beta-methylbutyrate to support wound healing ?MVI with minerals daily ? ?NUTRITION DIAGNOSIS:  ?Severe Malnutrition (in the context of chronic illness) related to poor appetite as evidenced by severe fat depletion, severe muscle depletion, percent weight loss (20.6% x 11.5 months). ? ?GOAL:  ?Patient will meet greater than or equal to 90% of their needs ? ?MONITOR:  ?PO intake, Supplement acceptance, Diet advancement ? ?REASON FOR ASSESSMENT:  ?Consult ?Assessment of nutrition requirement/status ? ?ASSESSMENT:  ?61 y.o. male with hx of COPD on 2L home O2, GERD, tobacco use, PVD, CAD, HLD, DM type 2, presented to ED with chest pain and SOB. Found to have NSTEMI  ? 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 Surgical incision not completely closed on admission.  ? ?Pt resting in bedside chair at the time of assessment. Pt reports being hungry, states he has not eaten anything since peanut butter and graham crackers since yesterday morning. Pt reports that he is unsure of cardiology's plan, has not met with the team this AM. ? ?Pt reports that he has lost a significant amount of weight in the last year and appetite has been decreased for several weeks. Reviewed weight, severe weight loss of 20.6% in ~1 year noted (4/11-3/24). ? ?Discussed nutrition supplements, pt does drink ensure at home. States that he likes the strawberry and chocolate flavors  but they are too expensive for him to buy on a regular basis. Will provide with coupons to use on discharge. Pt also agreeable to juven and MVI for wound healing. ? ?Nutritionally Relevant Medications: ?Scheduled Meds: ? insulin aspart  0-15 Units Subcutaneous Q4H  ? methylPREDNISolone   80 mg Intravenous Q24H  ? rosuvastatin  20 mg Oral Daily  ? ?Continuous Infusions: ? sodium chloride 100 mL/hr at 06/01/21 0419  ? ?PRN Meds: magnesium hydroxide, ondansetron  ? ?Labs Reviewed: ?Sodium 132, chloride 96 ?Creatinine .59 ?CBG ranges from 61-154 mg/dL over the last 24 hours  ?HgbA1c 5.1% (3/23) ? ?NUTRITION - FOCUSED PHYSICAL EXAM: ?Flowsheet Row Most Recent Value  ?Orbital Region Severe depletion  ?Upper Arm Region Severe depletion  ?Thoracic and Lumbar Region Moderate depletion  ?Buccal Region Severe depletion  ?Temple Region Moderate depletion  ?Clavicle Bone Region Severe depletion  ?Clavicle and Acromion Bone Region Severe depletion  ?Scapular Bone Region Severe depletion  ?Dorsal Hand Moderate depletion  ?Patellar Region Moderate depletion  ?Anterior Thigh Region Moderate depletion  ?Posterior Calf Region Severe depletion  ?Edema (RD Assessment) None  ?Hair Reviewed  ?Eyes Reviewed  ?Mouth Reviewed  ?Skin Reviewed  [scattered bruising]  ?Nails Reviewed  ? ?Diet Order:   ?Diet Order   ? ?       ?  Diet NPO time specified  Diet effective now       ?  ? ?  ?  ? ?  ? ? ?EDUCATION NEEDS:  ?Education needs have been addressed ? ?Skin:  Skin Assessment: Reviewed RN Assessment (  left Ischial tuberosity stage 2, non-healing surgical incision right foot, stage 1 to the sacrum) ? ?Last BM:  unsure ? ?Height:  ?Ht Readings from Last 1 Encounters:  ?05/31/21 5' 7" (1.702 m)  ? ? ?Weight:  ?Wt Readings from Last 1 Encounters:  ?05/31/21 60.6 kg  ? ? ?Ideal Body Weight:  67.3 kg ? ?BMI:  Body mass index is 20.94 kg/m?. ? ?Estimated Nutritional Needs:  ?Kcal:  1800-2000 kcal/d ?Protein:  90-100 g/d ?Fluid:  >/=2 L/d ? ? ?[name]  Hunter, RD, LDN ?Clinical Dietitian ?RD pager # available in AMION  ?After hours/weekend pager # available in AMION ?

## 2021-06-01 NOTE — Evaluation (Signed)
Physical Therapy Evaluation ?Patient Details ?Name: Raymond [name] ?MRN: [id number] ?DOB: 1961/02/15 ?Today's Date: 06/01/2021 ? ?History of Present Illness ? 61 yo admitted 3/23 with chest pain. PMhx: COPD on 2L, GERD, bipolar disorder, DM, HLD, PVD, OA, chronic pain, CAD, Rt 5th ray amp  ?Clinical Impression ? Pt pleasant and willing to get OOB with pt able to progress to pivot and squat pivot transfers with progressive fatigue with activity. Pt with noted bil UE and RLE ataxia and reports prior neck and back surgeries with significant decline in function for 3 weeks without preceding fall or incident. Pt with decreased strength, function, coordination, and transfers who will benefit  from acute therapy to maximize mobility and safety to decrease burden of care.  ? ?Pt on 4L throughout session with sats 93% ?   ? ?Recommendations for follow up therapy are one component of a multi-disciplinary discharge planning process, led by the attending physician.  Recommendations may be updated based on patient status, additional functional criteria and insurance authorization. ? ?Follow Up Recommendations Skilled nursing-short term rehab (<3 hours/day) ? ?  ?Assistance Recommended at Discharge Frequent or constant Supervision/Assistance  ?Patient can return home with the following ? A lot of help with walking and/or transfers;A little help with bathing/dressing/bathroom;Assistance with cooking/housework;Assist for transportation;Help with stairs or ramp for entrance ? ?  ?Equipment Recommendations Wheelchair (measurements PT);Wheelchair cushion (measurements PT)  ?Recommendations for Other Services ?    ?  ?Functional Status Assessment Patient has had a recent decline in their functional status and demonstrates the ability to make significant improvements in function in a reasonable and predictable amount of time.  ? ?  ?Precautions / Restrictions Precautions ?Precautions: Fall ?Precaution Comments: Watch O2,  incontinent ?Restrictions ?Weight Bearing Restrictions: No  ? ?  ? ?Mobility ? Bed Mobility ?Overal bed mobility: Needs Assistance ?Bed Mobility: Supine to Sit ?  ?  ?Supine to sit: HOB elevated, Min assist ?  ?  ?General bed mobility comments: HOB 25 degrees with use of rail and min assist to rise to sitting. Pt able to maintain static sitting with bil UE support ?  ? ?Transfers ?Overall transfer level: Needs assistance ?  ?Transfers: Sit to/from Stand, Bed to chair/wheelchair/BSC ?Sit to Stand: Min assist ?Stand pivot transfers: Mod assist ?  ?Squat pivot transfers: Max assist ?  ?  ?General transfer comment: pt able to stand and pivot from bed>recliner>BSC with left knee blocked and use of belt. After sitting to toilet with total assist for pericare in sitting pt then unable to fully stand and required max assist with knees blocked for squat pivot BSC>recliner ?  ? ?Ambulation/Gait ?  ?  ?  ?  ?  ?  ?  ?General Gait Details: unable ? ?Stairs ?  ?  ?  ?  ?  ? ?Wheelchair Mobility ?  ? ?Modified Rankin (Stroke Patients Only) ?  ? ?  ? ?Balance Overall balance assessment: Needs assistance ?  ?Sitting balance-Leahy Scale: Fair ?Sitting balance - Comments: static sitting ?  ?Standing balance support: Single extremity supported ?Standing balance-Leahy Scale: Poor ?Standing balance comment: knee blocked, single UE support and physical assist to maintain balance ?  ?  ?  ?  ?  ?  ?  ?  ?  ?  ?  ?   ? ? ? ?Pertinent Vitals/Pain Pain Assessment ?Faces Pain Scale: Hurts little more ?Pain Location: lower back ?Pain Descriptors / Indicators: Guarding, Aching ?Pain Intervention(s): Limited activity within patient's tolerance, Monitored  during session, Repositioned  ? ? ?Home Living Family/patient expects to be discharged to:: Private residence ?Living Arrangements: Children ?Available Help at Discharge: Family ?Type of Home: House ?Home Access: Stairs to enter ?Entrance Stairs-Rails: Right;Left ?Entrance Stairs-Number of  Steps: 2 ?  ?Home Layout: One level ?Home Equipment: Conservation officer, nature (2 wheels);Cane - single point;BSC/3in1 ?   ?  ?Prior Function Prior Level of Function : Needs assist ?  ?  ?  ?Physical Assist : Mobility (physical) ?Mobility (physical): Transfers ?  ?Mobility Comments: pt reports transferring to and from [facility] for last 3 weeks with assist, unable to walk but previously was walking with RW ?ADLs Comments: Daughter helped with what he couldnt manage (lower body) ?  ? ? ?Hand Dominance  ? Dominant Hand: Right ? ?  ?Extremity/Trunk Assessment  ? Upper Extremity Assessment ?Upper Extremity Assessment: Generalized weakness;LUE deficits/detail;RUE deficits/detail ?RUE Deficits / Details: pt demonstrating bil UE ataxia with over and under shooting with finger to nose, strength grossly 3/5 ?LUE Deficits / Details: pt demonstrating bil UE ataxia with over and under shooting with finger to nose, strength grossly 3/5, decreased grip on LUE ?  ? ?Lower Extremity Assessment ?Lower Extremity Assessment: RLE deficits/detail ?RLE Deficits / Details: ray amputation, decreased strength with knee buckling in standing on repeated trials and ataxia with RAM ?  ? ?Cervical / Trunk Assessment ?Cervical / Trunk Assessment: Kyphotic  ?Communication  ? Communication: No difficulties  ?Cognition Arousal/Alertness: Awake/alert ?Behavior During Therapy: [facility] for tasks assessed/performed ?Overall Cognitive Status: Impaired/Different from baseline ?Area of Impairment: Safety/judgement ?  ?  ?  ?  ?  ?  ?  ?  ?  ?  ?  ?  ?Safety/Judgement: Decreased awareness of safety, Decreased awareness of deficits ?  ?  ?  ?  ?  ? ?  ?General Comments   ? ?  ?Exercises    ? ?Assessment/Plan  ?  ?PT Assessment Patient needs continued PT services  ?PT Problem List Decreased strength;Decreased mobility;Decreased safety awareness;Decreased range of motion;Decreased coordination;Decreased activity tolerance;Decreased cognition;Decreased balance;Cardiopulmonary  status limiting activity;Decreased knowledge of use of DME ? ?   ?  ?PT Treatment Interventions DME instruction;Therapeutic exercise;Balance training;Functional mobility training;Neuromuscular re-education;Therapeutic activities;Patient/family education   ? ?PT Goals (Current goals can be found in the Care Plan section)  ?Acute Rehab PT Goals ?Patient Stated Goal: be able to return to walking and home ?PT Goal Formulation: With patient ?Time For Goal Achievement: 06/15/21 ?Potential to Achieve Goals: Fair ? ?  ?Frequency Min 3X/week ?  ? ? ?Co-evaluation   ?  ?  ?  ?  ? ? ?  ?AM-PAC PT "6 Clicks" Mobility  ?Outcome Measure Help needed turning from your back to your side while in a flat bed without using bedrails?: A Little ?Help needed moving from lying on your back to sitting on the side of a flat bed without using bedrails?: A Little ?Help needed moving to and from a bed to a chair (including a wheelchair)?: A Lot ?Help needed standing up from a chair using your arms (e.g., wheelchair or bedside chair)?: A Lot ?Help needed to walk in hospital room?: Total ?Help needed climbing 3-5 steps with a railing? : Total ?6 Click Score: 12 ? ?  ?End of Session Equipment Utilized During Treatment: Gait belt;Oxygen ?Activity Tolerance: Patient tolerated treatment well ?Patient left: in chair;with call bell/phone within reach;with chair alarm set ?Nurse Communication: Mobility status ?PT Visit Diagnosis: Other abnormalities of gait and mobility (R26.89);Difficulty in walking,  not elsewhere classified (R26.2);Muscle weakness (generalized) (M62.81);Other symptoms and signs involving the nervous system (R29.898) ?  ? ?Time: 1499-6924 ?PT Time Calculation (min) (ACUTE ONLY): 31 min ? ? ?Charges:   PT Evaluation ?$PT Eval Moderate Complexity: 1 Mod ?PT Treatments ?$Therapeutic Activity: 8-22 mins ?  ?   ? ? ?Nazier Neyhart P, PT ?Acute Rehabilitation Services ?Pager: [phone] ?Office: [phone] ? ? ?Calob Baskette B Masyn Fullam ?06/01/2021, 11:30  AM ? ?

## 2021-06-01 NOTE — Assessment & Plan Note (Addendum)
Body mass index is 21.86 kg/m?[name] ?Nutrition Problem: Severe Malnutrition (in the context of chronic illness) ?Etiology: poor appetite ?Nutrition Interventions: ?Interventions: Refer to RD note for recommendations  ?

## 2021-06-01 NOTE — NC FL2 (Addendum)
?[place] MEDICAID FL2 LEVEL OF CARE SCREENING TOOL  ?  ? ?IDENTIFICATION  ?Patient Name: ?Raymond [name] Birthdate: 12-20-1960 Sex: male Admission Date (Current Location): ?05/31/2021  ?[place] and [place] Number: ? [place] ?  Facility and Address:  ?[facility], [place] [address] [place], [place] [address] ?     Provider Number: ?[id number]  ?Attending Physician Name and Address:  ?Lavina Hamman, MD ? Relative Name and Phone Number:  ?[name] [phone] ?   ?Current Level of Care: ?Hospital Recommended Level of Care: ?[place] Prior Approval Number: ?  ? ?Date Approved/Denied: ?  PASRR Number: ?[id number] E ? ?Discharge Plan: ?SNF ?  ? ?Current Diagnoses: ?Patient Active Problem List  ? Diagnosis Date Noted  ? Pressure injury of skin 06/01/2021  ? Protein-calorie malnutrition, severe 06/01/2021  ? Acute and chronic respiratory failure with hypoxia ([place]) 06/01/2021  ? NSTEMI (non-ST elevated myocardial infarction) ([place]) 05/31/2021  ? Dyslipidemia 05/31/2021  ? GERD without esophagitis 05/31/2021  ? Delayed surgical wound healing of toe amputation stump ([place]) 05/31/2021  ? Depression 12/06/2020  ? Hyperlipidemia 12/06/2020  ? Tobacco use 12/06/2020  ? Peripheral vascular disease of lower extremity with ulceration ([place]) 12/06/2020  ? Occlusion of right iliac artery ([place]) 12/06/2020  ? Dry gangrene ([place]) 12/06/2020  ? Chronic respiratory failure ([place]) 12/06/2020  ? Gastric wall thickening 08/02/2020  ? Drug-induced constipation 08/02/2020  ? Moderate protein malnutrition ([place]) 07/21/2020  ? COPD with acute exacerbation ([place]) 06/22/2017  ? Hyperglycemia 06/22/2017  ? Anxiety 06/22/2017  ? Type 2 diabetes mellitus without complication ([place]) 74/25/9563  ? COPD (chronic obstructive pulmonary disease) ([place]) 06/22/2017  ? 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 CVA (cerebral infarction) 11/10/2013  ? Chest pain 04/05/2013  ? OA (osteoarthritis) of knee 02/24/2013  ? Chronic pain syndrome  02/24/2013  ? Spinal stenosis, lumbar region, with neurogenic claudication 02/24/2013  ? Gastritis 11/11/2012  ? Abnormal weight loss 11/11/2012  ? Gastroesophageal reflux disease 10/15/2012  ? Abdominal pain, other specified site 10/15/2012  ? ? ?Orientation RESPIRATION BLADDER Height & Weight   ?  ?Self, Time, Situation, Place ? O2 (Nasal Cannula 5 liters) Incontinent Weight: 133 lb 11.2 oz (60.6 kg) ?Height:  '5\' 7"'$  (170.2 cm)  ?BEHAVIORAL SYMPTOMS/MOOD NEUROLOGICAL BOWEL NUTRITION STATUS  ?    Incontinent Diet (Please see discharge summary)  ?AMBULATORY STATUS COMMUNICATION OF NEEDS Skin   ?Limited Assist Verbally Other (Comment) (pale,dry,abrasion,arm,bilateral,cleansed,ecchymosis,arm,bilateral,erythema,bilateral,foot,Left,PI sacrum medial stage 1,foam lift dressing,clean,dry,intact,PRN,PI Ischial tuberosity left stage 2,gauze,clean.dry,intact, please see add info) ?  ?  ?  ?    ?     ?     ? ? ?Personal Care Assistance Level of Assistance  ?Bathing, Feeding, Dressing Bathing Assistance: Limited assistance ?Feeding assistance: Limited assistance (needs little assist with eating and needs assist sitting up) ?Dressing Assistance: Limited assistance ?   ? ?Functional Limitations Info  ?Sight, Hearing, Speech Sight Info: Impaired ?Hearing Info: Adequate ?Speech Info: Adequate  ? ? ?SPECIAL CARE FACTORS FREQUENCY  ?PT (By licensed PT), OT (By licensed OT)   ?  ?PT Frequency: 5x min weekly ?OT Frequency: 5x min weekly ?  ?  ?  ?   ? ? ?Contractures    ? ? ?Additional Factors Info  ?Code Status, Allergies Code Status Info: FULL ?Allergies Info: Morphine And Related,Penicillins,Vicodin (hydrocodone-acetaminophen),Latex ?  ?  ?  ?   ? ?Current Medications (06/01/2021):  This is the current hospital active medication list ?Current Facility-Administered Medications  ?Medication  Dose Route Frequency Provider Last Rate Last Admin  ? 0.9 %  sodium chloride infusion   Intravenous Continuous Burnell Blanks, MD 100 mL/hr  at 06/01/21 0419 New Bag at 06/01/21 0419  ? 0.9 %  sodium chloride infusion   Intravenous Continuous Burnell Blanks, MD      ? 0.9 %  sodium chloride infusion  250 mL Intravenous PRN Burnell Blanks, MD      ? acetaminophen (TYLENOL) tablet 650 mg  650 mg Oral Q4H PRN Burnell Blanks, MD      ? ALPRAZolam Duanne Moron) tablet 0.25 mg  0.25 mg Oral BID PRN Burnell Blanks, MD   0.25 mg at 05/31/21 2141  ? aspirin chewable tablet 324 mg  324 mg Oral NOW Burnell Blanks, MD      ? Or  ? aspirin suppository 300 mg  300 mg Rectal NOW Burnell Blanks, MD      ? aspirin EC tablet 81 mg  81 mg Oral Daily Burnell Blanks, MD   81 mg at 06/01/21 0258  ? clopidogrel (PLAVIX) tablet 75 mg  75 mg Oral Daily Lavina Hamman, MD      ? Derrill Memo ON 06/02/2021] feeding supplement (ENSURE ENLIVE / ENSURE PLUS) liquid 237 mL  237 mL Oral TID BM Lavina Hamman, MD      ? fentaNYL (SUBLIMAZE) injection 25 mcg  25 mcg Intravenous Q4H PRN Burnell Blanks, MD   25 mcg at 06/01/21 1243  ? guaiFENesin (MUCINEX) 12 hr tablet 600 mg  600 mg Oral BID Burnell Blanks, MD   600 mg at 06/01/21 5277  ? [START ON 06/02/2021] heparin injection 5,000 Units  5,000 Units Subcutaneous Q8H Burnell Blanks, MD      ? hydrALAZINE (APRESOLINE) injection 10 mg  10 mg Intravenous Q20 Min PRN Burnell Blanks, MD      ? insulin aspart (novoLOG) injection 0-15 Units  0-15 Units Subcutaneous Q4H Burnell Blanks, MD   3 Units at 06/01/21 0421  ? ipratropium-albuterol (DUONEB) 0.5-2.5 (3) MG/3ML nebulizer solution 3 mL  3 mL Nebulization QID Burnell Blanks, MD   3 mL at 06/01/21 1211  ? labetalol (NORMODYNE) injection 10 mg  10 mg Intravenous Q10 min PRN Burnell Blanks, MD      ? magnesium hydroxide (MILK OF MAGNESIA) suspension 30 mL  30 mL Oral Daily PRN Burnell Blanks, MD      ? metoprolol tartrate (LOPRESSOR) tablet 12.5 mg  12.5 mg Oral BID  Burnell Blanks, MD   12.5 mg at 06/01/21 1245  ? multivitamin with minerals tablet 1 tablet  1 tablet Oral Daily Lavina Hamman, MD      ? nicotine (NICODERM CQ - dosed in mg/24 hours) patch 21 mg  21 mg Transdermal Daily Burnell Blanks, MD   21 mg at 06/01/21 8242  ? nitroGLYCERIN (NITROGLYN) 2 % ointment 1 inch  1 inch Topical Q6H Burnell Blanks, MD   1 inch at 06/01/21 1245  ? nitroGLYCERIN (NITROSTAT) SL tablet 0.4 mg  0.4 mg Sublingual Q5 Min x 3 PRN Burnell Blanks, MD   0.4 mg at 05/31/21 2038  ? [START ON 06/02/2021] nutrition supplement (JUVEN) (JUVEN) powder packet 1 packet  1 packet Oral BID BM Lavina Hamman, MD      ? ondansetron [facility]) injection 4 mg  4 mg Intravenous Q6H PRN Burnell Blanks, MD      ? [  START ON 06/02/2021] predniSONE (DELTASONE) tablet 40 mg  40 mg Oral Q breakfast Burnell Blanks, MD      ? Derrill Memo ON 06/02/2021] rosuvastatin (CRESTOR) tablet 40 mg  40 mg Oral Daily Burnell Blanks, MD      ? sodium chloride flush (NS) 0.9 % injection 3 mL  3 mL Intravenous Q12H Vikki Ports R, PA-C      ? sodium chloride flush (NS) 0.9 % injection 3 mL  3 mL Intravenous Q12H Burnell Blanks, MD      ? sodium chloride flush (NS) 0.9 % injection 3 mL  3 mL Intravenous PRN Burnell Blanks, MD      ? traZODone (DESYREL) tablet 25 mg  25 mg Oral QHS PRN Burnell Blanks, MD      ? ? ? ?Discharge Medications: ?Please see discharge summary for a list of discharge medications. ? ?Relevant Imaging Results: ? ?Relevant Lab Results: ? ? ?Additional Information ? ? ?[id number], Both Covid Vaccines and 1 booster-wound incision open or dehiced non-pressure wound foot,Right,lateral,gauze,changed reinforced,clean,dry,intact,wound incision open or dehiced puncture toe Left ? ?Milas Gain, LCSWA ? ? ? ? ?

## 2021-06-01 NOTE — Progress Notes (Signed)
?Progress Note ? ? ?Patient: Raymond [name] TDV:[id number] DOB: March 23, 1960 DOA: 05/31/2021     Hospitalization day: 0 ?DOS: the patient was seen and examined on 06/01/2021 ? ?Brief hospital course: ?Past medical history of CAD SP PCI, COPD, chronic respiratory failure, GERD, active smoker, PVD, type II DM.  Presents with complaints of chest pain and elevated troponin as well as COPD exacerbation. ?Cardiology was consulted.  Underwent cardiac catheterization on 3/24 which showed nonobstructive CAD.  Medical management recommended. ?Continues to have ongoing COPD exacerbation with hypoxia ? ?Assessment and Plan: ?* Acute and chronic respiratory failure with hypoxia (HCC) ?COPD with acute exacerbation. ?Patient uses 2 L of oxygen at home. ?Currently requiring 4 to 5 L of oxygen. ?Continues to have some shortness of breath. ?Bilateral examination reveals wheezing. ?Continue nebulizer therapy as well as steroids.  Will add doxycycline.  Monitor. ? ?Demand ischemia ([place]) ?Presented with complaints of chest tightness. ?Suspect this is in the setting of COPD exacerbation. ?Troponins were minimally elevated patient was started on IV heparin drip.  Symptoms most likely secondary to demand ischemia rather than non-STEMI. ?Management per cardiology. ?Underwent cardiac catheterization which shows nonobstructive CAD. ?Echocardiogram performed currently pending. ? ?Dyslipidemia ?- We will continue statin therapy ? ?Depression ?- We will continue Wellbutrin XL. ? ?Protein-calorie malnutrition, severe ?Body mass index is 20.94 kg/m?[name] ?Nutrition Problem: Severe Malnutrition (in the context of chronic illness) ?Etiology: poor appetite ?Nutrition Interventions: ?Interventions: Refer to RD note for recommendations  ? ?Pressure injury of skin ?Present on admission. ?Left lower buttock. ?Wound care consulted. ?For right toe ulcer, cleanse right lateral foot wound with saline, pat dry. Place a small piece of Xeroform gauze over the wound, then  dry gauze, secure with a few turns of kerlix. ?  ?For the left buttock/hip wound, cleanse with saline, place a small piece of xeroform gauze over the wound, and cover with a foam dressing. Change both every 2 days. ? ?Delayed surgical wound healing of toe amputation stump ([place]) ?- Wound care consult to be obtained. ? ?GERD without esophagitis ?- We will continue PPI therapy ? ?Tobacco use ?- We will place him on NicoDerm CQ 21 mg patch for heavy tobacco abuse ? ?Subjective: Continues to have shortness of breath.  Continues to have cough.  Some chest tightness reported as well.  No nausea or vomiting. ? ?Physical Exam: ?Vitals:  ? 06/01/21 1515 06/01/21 1531 06/01/21 1547 06/01/21 1600  ?BP: (!) 112/91 103/81 136/83 102/87  ?Pulse: 100 100 95 94  ?Resp:      ?Temp:      ?TempSrc:      ?SpO2: 90% 93% 90% 90%  ?Weight:      ?Height:      ? ?General: Appear in mild distress; no visible Abnormal Neck Mass Or lumps, Conjunctiva normal ?Cardiovascular: S1 and S2 Present, no Murmur, ?Respiratory: increased respiratory effort, Bilateral Air entry present and  no Crackles, bilateral wheezes ?Abdomen: Bowel Sound present, Non tender ?Extremities: no Pedal edema ?Neurology: alert and oriented to time, place, and person ?Gait not checked due to patient safety concerns  ? ?Data Reviewed: ?I have Reviewed nursing notes, Vitals, and Lab results since pt's last encounter. Pertinent lab results CBC and BMP ?I have ordered test including CBC and BMP and echocardiogram ?I have discussed pt's care plan and test results with cardiology.  ? ?Family Communication: None at bedside ? ?Disposition: ?Status is: Inpatient ?Remains inpatient appropriate because: Ongoing issues with hypoxia with COPD exacerbation ? ?Author: ?Berle Mull, MD ?  06/01/2021 6:46 PM ? ?For on call review [email or web address] ?

## 2021-06-01 NOTE — Progress Notes (Addendum)
RE: Raymond [name]  ? ?Date of Birth: [birth year] ? ?Date: 06/01/2021 ? ?To Whom It May Concern: ? ?Please be advised that the above-named patient will require a short-term nursing home stay - anticipated 30 days or less for rehabilitation and strengthening. The plan is for return home. ? ?MD signature ? ?Dat ?

## 2021-06-01 NOTE — Interval H&P Note (Signed)
History and Physical Interval Note: ? ?06/01/2021 ?1:23 PM ? ?Raymond [name]  has presented today for surgery, with the diagnosis of NSTEMI.  The various methods of treatment have been discussed with the patient and family. After consideration of risks, benefits and other options for treatment, the patient has consented to  Procedure(s): ?LEFT HEART CATH AND CORONARY ANGIOGRAPHY (N/A) as a surgical intervention.  The patient's history has been reviewed, patient examined, no change in status, stable for surgery.  I have reviewed the patient's chart and labs.  Questions were answered to the patient's satisfaction.   ? ?Cath Lab Visit (complete for each Cath Lab visit) ? ?Clinical Evaluation Leading to the Procedure:  ? ?ACS: Yes.   ? ?Non-ACS:   ? ?Anginal Classification: CCS III ? ?Anti-ischemic medical therapy: Minimal Therapy (1 class of medications) ? ?Non-Invasive Test Results: No non-invasive testing performed ? ?Prior CABG: No previous CABG ? ? ? ? ? ? ? ?Raymond [name] ? ? ?

## 2021-06-01 NOTE — Evaluation (Signed)
Occupational Therapy Evaluation Patient Details Name: Raymond [name] MRN: [id number] DOB: 1960/12/20 Today's Date: 06/01/2021   History of Present Illness 61 yo admitted 3/23 with chest pain. 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 Patient's daughter had neck surgery about 3 weeks ago and cannot assist patient in standing or ambulation, therefore patient stayed in bed unless someone else could help him. Currently, patient required min +2 assist in order to roll, as he was incontinent of bowel and unaware, and was ranging from min-total A from bed level for ADLs. Patient fatiguing after multiple bouts of rolling, and unable to attempt to sit EOB or transfers at end of session. Given current level of function and need for 24/7 assist, OT recommending SNF to address deficits.OT will continue to follow acutely in order to address problem list outlined below.      Recommendations for follow up therapy are one component of a multi-disciplinary discharge planning process, led by the attending physician.  Recommendations may be updated based on patient status, additional functional criteria and insurance authorization.   Follow Up Recommendations  Skilled nursing-short term rehab (<3 hours/day)    Assistance Recommended at Discharge Frequent or constant Supervision/Assistance  Patient can return home with the following A lot of help with walking and/or transfers;Two people to help with bathing/dressing/bathroom;Assistance with cooking/housework;Assistance with feeding;Direct supervision/assist for medications management;Direct supervision/assist for financial management;Assist for transportation;Help with stairs or ramp for entrance    Functional Status Assessment  Patient has  had a recent decline in their functional status and demonstrates the ability to make significant improvements in function in a reasonable and predictable amount of time.  Equipment Recommendations  Other (comment) (Will continue to assess)    Recommendations for Other Services       Precautions / Restrictions Precautions Precautions: Fall Precaution Comments: Watch O2 Restrictions Weight Bearing Restrictions: No      Mobility Bed Mobility Overal bed mobility: Needs Assistance Bed Mobility: Rolling Rolling: Min assist, +2 for physical assistance         General bed mobility comments: Patient incontinent of bowel, multiple bouts of rolling, good use of bed rail, min A provided at hip to roll fully over    Transfers                   General transfer comment: Deferred due to fatigue after multiple bouts of rolling      Balance                                           ADL either performed or assessed with clinical judgement   ADL Overall ADL's : Needs assistance/impaired Eating/Feeding: NPO   Grooming: Set up;Bed level   Upper Body Bathing: Minimal assistance;Bed level   Lower Body Bathing: +2 for physical assistance;Bed level;Maximal assistance   Upper Body Dressing : Minimal assistance;Bed level   Lower Body Dressing: Total assistance;Bed level     Toilet Transfer Details (indicate cue type and reason): Unaware of bowel movement Toileting- Clothing Manipulation and Hygiene: Bed level;Minimal assistance;+2 for physical assistance Toileting - Clothing Manipulation Details (indicate cue type and reason): able to roll,  but needs A to get fully over, total A for peri care     Functional mobility during ADLs: Moderate assistance;+2 for physical assistance;+2 for safety/equipment;Rolling walker (2 wheels) General ADL Comments: Patient presenting with decreased activity tolerance and endurance     Vision Baseline Vision/History: 1 Wears  glasses Ability to See in Adequate Light: 0 Adequate Patient Visual Report: No change from baseline       Perception     Praxis      Pertinent Vitals/Pain Pain Assessment Pain Assessment: Faces Faces Pain Scale: Hurts a little bit Pain Location: Sacrum with rolling Pain Descriptors / Indicators: Discomfort Pain Intervention(s): Limited activity within patient's tolerance, Monitored during session, Repositioned     Hand Dominance Right   Extremity/Trunk Assessment Upper Extremity Assessment Upper Extremity Assessment: Generalized weakness   Lower Extremity Assessment Lower Extremity Assessment: Defer to PT evaluation   Cervical / Trunk Assessment Cervical / Trunk Assessment: Kyphotic   Communication Communication Communication: No difficulties   Cognition Arousal/Alertness: Awake/alert Behavior During Therapy: WFL for tasks assessed/performed Overall Cognitive Status: Within Functional Limits for tasks assessed                                 General Comments: Oriented, but will continue to assess in further context     General Comments       Exercises     Shoulder Instructions      Home Living Family/patient expects to be discharged to:: Private residence Living Arrangements: Alone Available Help at Discharge: Family (Daughter drives 30 minutes each day to take care of him) Type of Home: House Home Access: Stairs to enter [employer] of Steps: 2 Entrance Stairs-Rails: Right;Left Home Layout: One level     Bathroom Shower/Tub: Chief Strategy Officer: Standard Bathroom Accessibility: Yes How Accessible: Accessible via wheelchair Home Equipment: Agricultural consultant (2 wheels);Cane - single point;BSC/3in1 (Rolling walker is "about broken" per patient)          Prior Functioning/Environment Prior Level of Function : Needs assist             Mobility Comments: Could not stand independently, could take some steps  with the walker ADLs Comments: Daughter helped with what he couldnt manage (lower body)        OT Problem List: Decreased strength;Decreased range of motion;Decreased activity tolerance;Impaired balance (sitting and/or standing);Decreased coordination;Decreased safety awareness;Decreased knowledge of use of DME or AE;Decreased knowledge of precautions;Cardiopulmonary status limiting activity;Pain      OT Treatment/Interventions: Self-care/ADL training;Therapeutic exercise;Energy conservation;DME and/or AE instruction;Therapeutic activities;Patient/family education;Balance training    OT Goals(Current goals can be found in the care plan section) Acute Rehab OT Goals Patient Stated Goal: to get better OT Goal Formulation: With patient Time For Goal Achievement: 06/15/21 Potential to Achieve Goals: Fair  OT Frequency: Min 2X/week    Co-evaluation              AM-PAC OT "6 Clicks" Daily Activity     Outcome Measure Help from another person eating meals?: Total Help from another person taking care of personal grooming?: A Little Help from another person toileting, which includes using toliet, bedpan, or urinal?: A Lot Help from another person bathing (including washing, rinsing, drying)?: A Lot Help from another person to put on and taking off regular upper body clothing?: A Little Help from another person to put on and taking off regular lower body clothing?: Total 6  Click Score: 12   End of Session Equipment Utilized During Treatment: Oxygen Nurse Communication: Mobility status;Other (comment) (Incontinent and recommendations)  Activity Tolerance: Patient limited by fatigue Patient left: in bed;with call bell/phone within reach;with nursing/sitter in room  OT Visit Diagnosis: Unsteadiness on feet (R26.81);Other abnormalities of gait and mobility (R26.89);Muscle weakness (generalized) (M62.81);Pain;Adult, failure to thrive (R62.7) Pain - part of body:  (Back)                 Time: 9604-5409 OT Time Calculation (min): 28 min Charges:  OT General Charges $OT Visit: 1 Visit OT Evaluation $OT Eval Moderate Complexity: 1 Mod OT Treatments $Self Care/Home Management : 8-22 mins  Pollyann Glen E. Hollynn Garno, OTR/L Acute Rehabilitation Services [phone]   Cherlyn Cushing 06/01/2021, 9:13 AM

## 2021-06-01 NOTE — Assessment & Plan Note (Addendum)
COPD with acute exacerbation secondary to community-acquired pneumonia. ?Bilateral small pleural effusion. ?Patient uses 2 L of oxygen at home. weaned off to 3 L from 5 L. ?Chest x-ray left lower lobe pneumonia. ?He completed a course of doxycycline  ?Continue nebulizer therapy as well as steroids. ?Respiratory status appears to be approaching baseline. ?

## 2021-06-01 NOTE — Consult Note (Signed)
WOC Nurse Consult Note: ?Patient receiving care in [place]. Patient able to turn self in bed. ?Reason for Consult: "Right big 2 ulcer"--not sure exactly what this is supposed to mean. Patient guided me on where to look. 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 Wound is 100% pink, and measures 4 cm x 1.5 cm x 0.2 cm. ?He also informed me that he has a wound on his left hip from where he had been lying in the bed for an extended period. ?The the lower left buttock/hip area there is a pink area that resembles a superficial abrasion, and within that area there is a 1 cm x 1 cm wound that is yellow.  The yellow layer may well be a biofilm rather than slough. ?Pressure Injury POA: Yes ?Measurement: ?Wound bed: ?Drainage (amount, consistency, odor) none from either ?Periwound: intact ?Dressing procedure/placement/frequency: ?Cleanse right lateral foot wound with saline, pat dry. Place a small piece of Xeroform gauze Kellie Simmering [phone]) over the wound, then dry gauze, secure with a few turns of kerlix. ? ?For the left buttock/hip wound, cleanse with saline, place a small piece of xeroform gauze Kellie Simmering [phone]) over the wound, and cover with a foam dressing. Change both every 2 days. ?  ? ?Monitor the wound area(s) for worsening of condition such as: ?Signs/symptoms of infection,  ?Increase in size,  ?Development of or worsening of odor, ?Development of pain, or increased pain at the affected locations.  Notify the medical team if any of these develop. ? ?Thank you for the consult.  Discussed plan of care with the patient.  [place] nurse will not follow at this time.  Please re-consult the [place] team if needed. ? ?Val Riles, RN, MSN, CWOCN, CNS-BC, pager [phone]  ?

## 2021-06-02 ENCOUNTER — Inpatient Hospital Stay (HOSPITAL_COMMUNITY): Payer: Medicare HMO

## 2021-06-02 DIAGNOSIS — J9621 Acute and chronic respiratory failure with hypoxia: Secondary | ICD-10-CM | POA: Diagnosis not present

## 2021-06-02 DIAGNOSIS — E785 Hyperlipidemia, unspecified: Secondary | ICD-10-CM | POA: Diagnosis not present

## 2021-06-02 DIAGNOSIS — I214 Non-ST elevation (NSTEMI) myocardial infarction: Secondary | ICD-10-CM | POA: Diagnosis not present

## 2021-06-02 DIAGNOSIS — J189 Pneumonia, unspecified organism: Secondary | ICD-10-CM | POA: Diagnosis present

## 2021-06-02 LAB — GLUCOSE, CAPILLARY
Glucose-Capillary: 107 mg/dL — ABNORMAL HIGH (ref 70–99)
Glucose-Capillary: 110 mg/dL — ABNORMAL HIGH (ref 70–99)
Glucose-Capillary: 116 mg/dL — ABNORMAL HIGH (ref 70–99)
Glucose-Capillary: 119 mg/dL — ABNORMAL HIGH (ref 70–99)
Glucose-Capillary: 128 mg/dL — ABNORMAL HIGH (ref 70–99)
Glucose-Capillary: 157 mg/dL — ABNORMAL HIGH (ref 70–99)
Glucose-Capillary: 81 mg/dL (ref 70–99)
Glucose-Capillary: 86 mg/dL (ref 70–99)

## 2021-06-02 LAB — BASIC METABOLIC PANEL
Anion gap: 6 (ref 5–15)
BUN: 9 mg/dL (ref 6–20)
CO2: 27 mmol/L (ref 22–32)
Calcium: 7.7 mg/dL — ABNORMAL LOW (ref 8.9–10.3)
Chloride: 99 mmol/L (ref 98–111)
Creatinine, Ser: 0.61 mg/dL (ref 0.61–1.24)
GFR, Estimated: >60 mL/min (ref >60)
Glucose, Bld: 107 mg/dL — ABNORMAL HIGH (ref 70–99)
Potassium: 4.1 mmol/L (ref 3.5–5.1)
Sodium: 132 mmol/L — ABNORMAL LOW (ref 135–145)

## 2021-06-02 LAB — CBC
HCT: 32.2 % — ABNORMAL LOW (ref 39.0–52.0)
Hemoglobin: 9.3 g/dL — ABNORMAL LOW (ref 13.0–17.0)
MCH: 20.5 pg — ABNORMAL LOW (ref 26.0–34.0)
MCHC: 28.9 g/dL — ABNORMAL LOW (ref 30.0–36.0)
MCV: 70.9 fL — ABNORMAL LOW (ref 80.0–100.0)
Platelets: 346 10*3/uL (ref 150–400)
RBC: 4.54 MIL/uL (ref 4.22–5.81)
RDW: 20.1 % — ABNORMAL HIGH (ref 11.5–15.5)
WBC: 16.8 10*3/uL — ABNORMAL HIGH (ref 4.0–10.5)
nRBC: 0 % (ref 0.0–0.2)

## 2021-06-02 MED ORDER — ALLOPURINOL 100 MG PO TABS
100.0000 mg | ORAL_TABLET | Freq: Every morning | ORAL | Status: DC
  Administered 2021-06-02 – 2021-06-15 (×14): 100 mg via ORAL
  Filled 2021-06-02 (×14): qty 1

## 2021-06-02 MED ORDER — MAGNESIUM OXIDE -MG SUPPLEMENT 400 (240 MG) MG PO TABS
400.0000 mg | ORAL_TABLET | Freq: Every day | ORAL | Status: DC
  Administered 2021-06-02 – 2021-06-15 (×14): 400 mg via ORAL
  Filled 2021-06-02 (×15): qty 1

## 2021-06-02 MED ORDER — BUPROPION HCL ER (XL) 150 MG PO TB24
150.0000 mg | ORAL_TABLET | Freq: Every day | ORAL | Status: DC
  Administered 2021-06-02 – 2021-06-15 (×12): 150 mg via ORAL
  Filled 2021-06-02 (×15): qty 1

## 2021-06-02 MED ORDER — OXYCODONE-ACETAMINOPHEN 10-325 MG PO TABS: 1.0000 | ORAL_TABLET | Freq: Four times a day (QID) | ORAL | Status: DC | PRN

## 2021-06-02 MED ORDER — OXYCODONE-ACETAMINOPHEN 5-325 MG PO TABS
1.0000 | ORAL_TABLET | Freq: Four times a day (QID) | ORAL | Status: DC | PRN
  Administered 2021-06-02 – 2021-06-14 (×30): 1 via ORAL
  Filled 2021-06-02 (×30): qty 1

## 2021-06-02 MED ORDER — PANTOPRAZOLE SODIUM 40 MG PO TBEC
40.0000 mg | DELAYED_RELEASE_TABLET | Freq: Every day | ORAL | Status: DC
  Administered 2021-06-02 – 2021-06-03 (×2): 40 mg via ORAL
  Filled 2021-06-02 (×2): qty 1

## 2021-06-02 MED ORDER — TAMSULOSIN HCL 0.4 MG PO CAPS
0.4000 mg | ORAL_CAPSULE | Freq: Every morning | ORAL | Status: DC
  Administered 2021-06-02 – 2021-06-15 (×15): 0.4 mg via ORAL
  Filled 2021-06-02 (×15): qty 1

## 2021-06-02 MED ORDER — FENTANYL CITRATE PF 50 MCG/ML IJ SOSY
25.0000 ug | PREFILLED_SYRINGE | INTRAMUSCULAR | Status: DC | PRN
  Administered 2021-06-03 (×2): 25 ug via INTRAVENOUS
  Filled 2021-06-02 (×2): qty 1

## 2021-06-02 MED ORDER — GABAPENTIN 300 MG PO CAPS
300.0000 mg | ORAL_CAPSULE | Freq: Three times a day (TID) | ORAL | Status: DC
  Administered 2021-06-02 – 2021-06-03 (×4): 300 mg via ORAL
  Filled 2021-06-02 (×4): qty 1

## 2021-06-02 MED ORDER — OXYCODONE HCL 5 MG PO TABS: 5.0000 mg | ORAL_TABLET | Freq: Four times a day (QID) | ORAL | Status: DC | PRN

## 2021-06-02 NOTE — Assessment & Plan Note (Deleted)
Patient has chronic pain and is on Percocet.  We will continue. ?Also on chronic gabapentin which we will continue. ?Patient reports that he had a fall before his September presentation and has developed some numbness in bilateral upper and lower extremity and is supposed to see neurosurgery outpatient. ?CT scan performed in September 2022 after the fall does not reveal any evidence of fracture or acute abnormality in his spine. ?On chronic percocet, will continue. ?

## 2021-06-02 NOTE — Progress Notes (Signed)
Occupational Therapy Treatment ?Patient Details ?Name: Raymond [name] ?MRN: [id number] ?DOB: Dec 31, 1960 ?Today's Date: 06/02/2021 ? ? ?History of present illness 61 yo admitted 3/23 with chest pain. 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  ?OT comments ? Pt was in bed and reported decrease in ability to self feed self this AM and can not operate phone and call light due to decrease in grip and coordination in BUE but especially R. Pt was able to complete bed mobility with mod assist and lateral scooting at EOB with min to mod assist. Pt reported they were at 10/10 pain but with repositioning and sitting at EOB was able to reduce pain levels at this time. (Nurse also aware)  Pt currently with functional limitations due to the deficits listed below (see OT Problem List).  Pt will benefit from skilled OT to increase their safety and independence with ADL and functional mobility for ADL to facilitate discharge to venue listed below.  ?  ? ?Recommendations for follow up therapy are one component of a multi-disciplinary discharge planning process, led by the attending physician.  Recommendations may be updated based on patient status, additional functional criteria and insurance authorization. ?   ?Follow Up Recommendations ? Skilled nursing-short term rehab (<3 hours/day)  ?  ?Assistance Recommended at Discharge Frequent or constant Supervision/Assistance  ?Patient can return home with the following ? A lot of help with walking and/or transfers;Two people to help with bathing/dressing/bathroom;Assistance with cooking/housework;Assistance with feeding;Direct supervision/assist for medications management;Direct supervision/assist for financial management;Assist for transportation;Help with stairs or ramp for entrance ?  ?Equipment Recommendations ?    ?  ?Recommendations for Other Services   ? ?  ?Precautions / Restrictions Precautions ?Precautions: Fall ?Precaution  Comments: Watch O2, incontinent ?Restrictions ?Weight Bearing Restrictions: No  ? ? ?  ? ?Mobility Bed Mobility ?Overal bed mobility: Needs Assistance ?Bed Mobility: Rolling, Supine to Sit, Sit to Supine ?Rolling: Min assist ?  ?Supine to sit: HOB elevated, Min assist ?Sit to supine: Mod assist ?  ?  ?  ? ?Transfers ?Overall transfer level: Needs assistance ?  ?  ?  ?  ?  ?  ?  ?  ?General transfer comment: Pt completed lateral side scooting to [facility] with BUE use but decrease in control of all limbs to be able to coordinate movement and required mod assistance ?  ?  ?Balance Overall balance assessment: Needs assistance ?  ?Sitting balance-Leahy Scale: Fair ?Sitting balance - Comments: Pt was able to remain in unsupportive sitting but neted would have decrease sontrol of UE and would start to lean in that direction unless watching UE ?  ?  ?  ?  ?  ?  ?  ?  ?  ?  ?  ?  ?  ?  ?  ?   ? ?ADL either performed or assessed with clinical judgement  ? ?ADL Overall ADL's : Needs assistance/impaired ?Eating/Feeding: Maximal assistance;Sitting ?  ?Grooming: Minimal assistance;Cueing for safety;Cueing for sequencing;Sitting ?  ?Upper Body Bathing: Minimal assistance;Bed level ?  ?  ?  ?Upper Body Dressing : Minimal assistance;Bed level ?  ?  ?  ?  ?  ?  ?  ?  ?  ?  ?  ?  ? ?Extremity/Trunk Assessment Upper Extremity Assessment ?Upper Extremity Assessment: RUE deficits/detail;LUE deficits/detail ?RUE Deficits / Details: pt demonstrating bil UE ataxia with over and under shooting with finger to nose, strength  grossly 3/5 ?RUE Sensation: decreased light touch;decreased proprioception ?RUE Coordination: decreased fine motor;decreased gross motor ?LUE Deficits / Details: pt demonstrating bil UE ataxia with over and under shooting with finger to nose, strength grossly 3/5, decreased grip on LUE ?LUE Sensation: decreased light touch;decreased proprioception ?LUE Coordination: decreased fine motor;decreased gross motor ?  ?Lower Extremity  Assessment ?Lower Extremity Assessment: Defer to PT evaluation ?  ?  ?  ? ?Vision   ?  ?  ?Perception   ?  ?Praxis   ?  ? ?Cognition Arousal/Alertness: Awake/alert ?Behavior During Therapy: [facility] for tasks assessed/performed ?Overall Cognitive Status: Impaired/Different from baseline ?Area of Impairment: Safety/judgement ?  ?  ?  ?  ?  ?  ?  ?  ?  ?  ?  ?  ?Safety/Judgement: Decreased awareness of safety, Decreased awareness of deficits ?  ?  ?  ?  ?  ?   ?Exercises   ? ?  ?Shoulder Instructions   ? ? ?  ?General Comments    ? ? ?Pertinent Vitals/ Pain       Pain Assessment ?Pain Assessment: 0-10 ?Pain Score: 10-Worst pain ever ?Pain Location: lower back ?Pain Descriptors / Indicators: Guarding, Aching ?Pain Intervention(s): Limited activity within patient's tolerance, Premedicated before session ? ?Home Living   ?  ?  ?  ?  ?  ?  ?  ?  ?  ?  ?  ?  ?  ?  ?  ?  ?  ?  ? ?  ?Prior Functioning/Environment    ?  ?  ?  ?   ? ?Frequency ? Min 2X/week  ? ? ? ? ?  ?Progress Toward Goals ? ?OT Goals(current goals can now be found in the care plan section) ? Progress towards OT goals: Progressing toward goals ? ?Acute Rehab OT Goals ?Patient Stated Goal: to have less pain ?OT Goal Formulation: With patient ?Time For Goal Achievement: 06/15/21 ?Potential to Achieve Goals: Fair ?ADL Goals ?Pt Will Perform Lower Body Bathing: with min assist;sitting/lateral leans;sit to/from stand;with adaptive equipment ?Pt Will Perform Lower Body Dressing: with min assist;sitting/lateral leans;sit to/from stand;with adaptive equipment ?Pt Will Transfer to Toilet: with min assist;stand pivot transfer ?Pt/caregiver will Perform Home Exercise Program: Increased ROM;Increased strength;Both right and left upper extremity;With theraband;With minimal assist;With written HEP provided ?Additional ADL Goal #1: Patient will demonstrate increased activity tolerance by being able to sit EOB for 5-7 to complete functional ADL/IADL task.  ?Plan Discharge plan  remains appropriate   ? ?Co-evaluation ? ? ?   ?  ?  ?  ?  ? ?  ?AM-PAC OT "6 Clicks" Daily Activity     ?Outcome Measure ? ? Help from another person eating meals?: Total ?Help from another person taking care of personal grooming?: A Little ?Help from another person toileting, which includes using toliet, bedpan, or urinal?: A Lot ?Help from another person bathing (including washing, rinsing, drying)?: A Lot ?Help from another person to put on and taking off regular upper body clothing?: A Little ?Help from another person to put on and taking off regular lower body clothing?: Total ?6 Click Score: 12 ? ?  ?End of Session   ? ?OT Visit Diagnosis: Unsteadiness on feet (R26.81);Other abnormalities of gait and mobility (R26.89);Muscle weakness (generalized) (M62.81);Pain;Adult, failure to thrive (R62.7) ?Pain - part of body:  (back) ?  ?Activity Tolerance Patient limited by pain;Patient limited by fatigue ?  ?Patient Left in bed;with call bell/phone within reach;with bed alarm set ?  ?  Nurse Communication  (pain) ?  ? ?   ? ?Time: 0823-0913 ?OT Time Calculation (min): 50 min ? ?Charges: OT General Charges ?$OT Visit: 1 Visit ?OT Treatments ?$Self Care/Home Management : 38-52 mins ? ?Joeseph Amor OTR/L  ?Acute Rehab Services  ?[phone] office number ?[phone] pager number ? ? ?Joeseph Amor ?06/02/2021, 1:45 PM ?

## 2021-06-02 NOTE — TOC Progression Note (Signed)
Transition of Care (TOC) - Progression Note  ? ? ?Patient Details  ?Name: [name] ?MRN: [id number] ?Date of Birth: 17-Jan-1961 ? ?Transition of Care (TOC) CM/SW Contact  ?Bary Castilla, LCSW ?Phone Number: [phone] ?06/02/2021, 1:59 PM ? ?Clinical Narrative:    ? ?Per MD pt is close to being medically ready for DC. CSW met with pt and provided him with his one bed offer from [facility] and pt excepted. Pt stated that he will go where he has an offer. ? ?Pt's PASRR has been completed  [phone] E. CSW attempted to reach [facility] and had to leave a message. ? ?CSW called [facility] and they stated that they needed to check his eligibility. They informed CSW that they would call CSW back therefore authorization has not been started. ? ?TOC team will continue to assist with discharge planning needs.  ? ? ?Expected Discharge Plan: [place] ?Barriers to Discharge: Continued Medical Work up ? ?Expected Discharge Plan and Services ?Expected Discharge Plan: [place] ?In-house Referral: Clinical Social Work ?  ?  ?Living arrangements for the past 2 months: [place] ?                ?  ?  ?  ?  ?  ?  ?  ?  ?  ?  ? ? ?Social Determinants of Health (SDOH) Interventions ?  ? ?Readmission Risk Interventions ? ?  12/13/2020  ?  2:02 PM  ?Readmission Risk Prevention Plan  ?Transportation Screening Complete  ?PCP or Specialist Appt within 5-7 Days Complete  ?Home Care Screening Complete  ?Medication Review (RN CM) Complete  ? ? ?

## 2021-06-02 NOTE — Progress Notes (Signed)
?Progress Note ? ? ?Patient: Raymond [name] BJY:[id number] DOB: May 09, 1960 DOA: 05/31/2021     Hospitalization day: 1 ?DOS: the patient was seen and examined on 06/02/2021 ? ?Brief hospital course: ?Past medical history of CAD SP PCI, COPD, chronic respiratory failure, GERD, active smoker, PVD, type II DM.  Presents with complaints of chest pain and elevated troponin as well as COPD exacerbation. ?Cardiology was consulted.  Underwent cardiac catheterization on 3/24 which showed nonobstructive CAD.  Medical management recommended. ?Continues to have ongoing COPD exacerbation with hypoxia ? ?Assessment and Plan: ?* Acute and chronic respiratory failure with hypoxia (HCC) ?COPD with acute exacerbation secondary to community-acquired pneumonia. ?Patient uses 2 L of oxygen at home. ?Currently requiring 4 to 5 L of oxygen.  Now able to be weaned off to 3 L of oxygen. ?Chest x-ray shows evidence of left lower lobe pneumonia. ?No cultures performed on admission.  Currently on doxycycline.  Will add Omnicef. ?Continues to have some shortness of breath. ?Bilateral examination reveals wheezing. ?Continue nebulizer therapy as well as steroids. ? ?NSTEMI (non-ST elevated myocardial infarction) ([place]) ?HLD. ?HTN. ?Presented with complaints of chest tightness. ?Troponins were minimally elevated patient was started on IV heparin drip.  Cardiology considers this as non-STEMI. ?Underwent cardiac catheterization which shows nonobstructive CAD. ?Echocardiogram performed, EF 55 to 60%. ?Currently patient will be on 81 mg aspirin, 75 mg Plavix, 12.5 mg twice daily metoprolol as well as 40 mg daily Crestor (Crestor dose increased from 10 mg). ?Cardiology currently signed off. ?Outpatient follow-up. ? ?Depression ?We will continue Wellbutrin XL.  Continue as needed Xanax. ? ?Spinal stenosis, lumbar region, with neurogenic claudication ?Patient has chronic pain and is on Percocet.  We will continue. ?Also on chronic gabapentin which we will  continue. ?Patient reports that he had a fall before his September presentation and has developed some numbness in bilateral upper and lower extremity and is supposed to see neurosurgery outpatient. ?CT scan performed in September 2022 after the fall does not reveal any evidence of fracture or acute abnormality in his spine. ? ?Delayed surgical wound healing of toe amputation stump ([place]) ?Appreciate wound care consultation.  Monitor. ? ?Protein-calorie malnutrition, severe ?Body mass index is 20.94 kg/m?[name] ?Nutrition Problem: Severe Malnutrition (in the context of chronic illness) ?Etiology: poor appetite ?Nutrition Interventions: ?Interventions: Refer to RD note for recommendations  ? ?Pressure injury of skin ?Present on admission. ?Left lower buttock. ?Wound care consulted. ?For right toe ulcer, cleanse right lateral foot wound with saline, pat dry. Place a small piece of Xeroform gauze over the wound, then dry gauze, secure with a few turns of kerlix. ?  ?For the left buttock/hip wound, cleanse with saline, place a small piece of xeroform gauze over the wound, and cover with a foam dressing. Change both every 2 days. ? ?GERD without esophagitis ?Continue Protonix. ? ?Tobacco use ?Continue nicotine patch. ?Patient was counseled on tobacco cessation. ? ?Subjective: No nausea no vomiting no fever no chills.  No chest pain abdominal pain.  Continues to report shortness of breath as well as cough although improving.  Reports lower back pain. ? ?Physical Exam: ?Vitals:  ? 06/02/21 1137 06/02/21 1147 06/02/21 1200 06/02/21 1633  ?BP: 119/78   109/77  ?Pulse: 79 79 84 (!) 102  ?Resp: '18 18  18  '$ ?Temp: 98.3 ?F (36.8 ?C)   98.7 ?F (37.1 ?C)  ?TempSrc: Oral   Oral  ?SpO2: 94% 94% 97% 90%  ?Weight:      ?Height:      ? ?  General: Appear in mild distress; no visible Abnormal Neck Mass Or lumps, Conjunctiva normal ?Cardiovascular: S1 and S2 Present, no Murmur, ?Respiratory: good respiratory effort, Bilateral Air entry present  and no Crackles, Occasional wheezes ?Abdomen: Bowel Sound present, Non tender ?Extremities: no Pedal edema ?Neurology: alert and oriented to time, place, and person ?Gait not checked due to patient safety concerns  ? ?Data Reviewed: ?I have Reviewed nursing notes, Vitals, and Lab results since pt's last encounter. Pertinent lab results CBC and BMP ?I have ordered test including CBC and BMP ?I have ordered imaging studies chest x-ray. ?I have reviewed the last note from cardiology,   ? ?Family Communication: None at bedside ? ?Disposition: ?Status is: Inpatient ?Remains inpatient appropriate because: Ongoing issues with hypoxia and pneumonia requiring close observation. ? ?Author: ?Berle Mull, MD ?06/02/2021 4:48 PM ? ?For on call review [email or web address]. ?

## 2021-06-02 NOTE — Progress Notes (Signed)
? ?Progress Note ? ?Patient Name: Raymond [name] ?Date of Encounter: 06/02/2021 ? ?[facility] Cardiologist: Carlyle Dolly, MD  ? ?Subjective  ? ?BP 126/94.  Creatinine 0.6, hemoglobin stable at 9.3.  Reports chest pain has improved. ? ?Inpatient Medications  ?  ?Scheduled Meds: ? aspirin EC  81 mg Oral Daily  ? clopidogrel  75 mg Oral Daily  ? doxycycline  100 mg Oral Q12H  ? feeding supplement  237 mL Oral TID BM  ? guaiFENesin  600 mg Oral BID  ? heparin  5,000 Units Subcutaneous Q8H  ? insulin aspart  0-15 Units Subcutaneous Q4H  ? ipratropium-albuterol  3 mL Nebulization QID  ? metoprolol tartrate  12.5 mg Oral BID  ? multivitamin with minerals  1 tablet Oral Daily  ? nicotine  21 mg Transdermal Daily  ? nitroGLYCERIN  1 inch Topical Q6H  ? nutrition supplement (JUVEN)  1 packet Oral BID BM  ? predniSONE  40 mg Oral Q breakfast  ? rosuvastatin  40 mg Oral Daily  ? sodium chloride flush  3 mL Intravenous Q12H  ? sodium chloride flush  3 mL Intravenous Q12H  ? ?Continuous Infusions: ? sodium chloride    ? ?PRN Meds: ?sodium chloride, acetaminophen, ALPRAZolam, alum & mag hydroxide-simeth, fentaNYL (SUBLIMAZE) injection, magnesium hydroxide, nitroGLYCERIN, ondansetron (ZOFRAN) IV, sodium chloride flush, traZODone  ? ?Vital Signs  ?  ?Vitals:  ? 06/02/21 0454 06/02/21 0757 06/02/21 0920 06/02/21 0932  ?BP: 115/86 117/86 (!) 126/94   ?Pulse: 78 73 79 75  ?Resp: '17 18  18  '$ ?Temp: 98.3 ?F (36.8 ?C) 97.8 ?F (36.6 ?C)    ?TempSrc: Oral Oral    ?SpO2: 98% 95%  94%  ?Weight:      ?Height:      ? ? ?Intake/Output Summary (Last 24 hours) at 06/02/2021 1058 ?Last data filed at 06/02/2021 0757 ?Gross per 24 hour  ?Intake 2064.94 ml  ?Output 2100 ml  ?Net -35.06 ml  ? ? ?  05/31/2021  ?  8:39 PM 05/31/2021  ?  8:13 PM 02/15/2021  ?  3:21 PM  ?Last 3 Weights  ?Weight (lbs) 133 lb 11.2 oz 131 lb 135 lb  ?Weight (kg) 60.646 kg 59.421 kg 61.236 kg  ?   ? ?Telemetry  ?  ?NSR - Personally Reviewed ? ?ECG  ?  ?No new ECG - Personally  Reviewed ? ?Physical Exam  ? ?GEN: No acute distress.   ?Neck: No JVD ?Cardiac: RRR, no murmurs, rubs, or gallops.  ?Respiratory: Clear to auscultation bilaterally. ?GI: Soft, nontender, non-distended  ?MS: No edema; No deformity. ?Neuro:  Nonfocal  ?Psych: Normal affect  ? ?Labs  ?  ?High Sensitivity Troponin:   ?Recent Labs  ?Lab 05/31/21 ?2145 05/31/21 ?2356  ?TROPONINIHS 55* 55*  ?   ?Chemistry ?Recent Labs  ?Lab 06/01/21 ?0737 06/02/21 ?0315  ?NA 132* 132*  ?K 4.3 4.1  ?CL 96* 99  ?CO2 28 27  ?GLUCOSE 139* 107*  ?BUN 9 9  ?CREATININE 0.59* 0.61  ?CALCIUM 7.6* 7.7*  ?GFRNONAA >60 >60  ?ANIONGAP 8 6  ?  ?Lipids No results for input(s): CHOL, TRIG, HDL, LABVLDL, LDLCALC, CHOLHDL in the last 168 hours.  ?Hematology ?Recent Labs  ?Lab 06/01/21 ?1062 06/02/21 ?0315  ?WBC 7.1 16.8*  ?RBC 4.58 4.54  ?HGB 9.3* 9.3*  ?HCT 32.0* 32.2*  ?MCV 69.9* 70.9*  ?MCH 20.3* 20.5*  ?MCHC 29.1* 28.9*  ?RDW 20.3* 20.1*  ?PLT 369 346  ? ?Thyroid No results  for input(s): TSH, FREET4 in the last 168 hours.  ?BNPNo results for input(s): BNP, PROBNP in the last 168 hours.  ?DDimer No results for input(s): DDIMER in the last 168 hours.  ? ?Radiology  ?  ?CARDIAC CATHETERIZATION ? ?Result Date: 06/01/2021 ?  Prox RCA lesion is 50% stenosed.   Mid Cx lesion is 20% stenosed.   Mid LAD lesion is 20% stenosed. Moderate eccentric stenosis in the mid RCA. This does not appear to be flow limiting Mild disease in the mid LAD and mid Circumflex LVEDP 14 mmHg Recommendations: Medical management of CAD. No culprit lesion is seen.  ? ?ECHOCARDIOGRAM COMPLETE ? ?Result Date: 06/01/2021 ?   ECHOCARDIOGRAM REPORT   Patient Name:   Raymond [name] Date of Exam: 06/01/2021 Medical Rec #:  [id number]   Height:       67.0 in Accession #:    [id number]  Weight:       133.7 lb Date of Birth:  March 01, 1961   BSA:          1.704 m? Patient Age:    61 years    BP:           99/82 mmHg Patient Gender: M           HR:           94 bpm. Exam Location:  Inpatient Procedure: 2D  Echo Indications:    elevated troponin  History:        Patient has prior history of Echocardiogram examinations, most                 recent 01/23/2009. COPD; Risk Factors:Dyslipidemia and Current                 Smoker.  Sonographer:    Johny Chess RDCS Referring Phys: [id number] [place]  1. Left ventricular ejection fraction, by estimation, is 55 to 60%. The left ventricle has normal function. The left ventricle has no regional wall motion abnormalities. Left ventricular diastolic parameters are indeterminate.  2. Right ventricular systolic function is normal. The right ventricular size is normal. Tricuspid regurgitation signal is inadequate for assessing PA pressure.  3. Left atrial size was mild to moderately dilated.  4. The mitral valve is normal in structure. Mild mitral valve regurgitation. No evidence of mitral stenosis.  5. The aortic valve has an indeterminant number of cusps. There is mild calcification of the aortic valve. Aortic valve regurgitation is not visualized. No aortic stenosis is present.  6. The inferior vena cava is dilated in size with >50% respiratory variability, suggesting right atrial pressure of 8 mmHg. Comparison(s): Prior images unable to be directly viewed, comparison made by report only. Conclusion(s)/Recommendation(s): Normal biventricular function without evidence of hemodynamically significant valvular heart disease. FINDINGS  Left Ventricle: Left ventricular ejection fraction, by estimation, is 55 to 60%. The left ventricle has normal function. The left ventricle has no regional wall motion abnormalities. The left ventricular internal cavity size was normal in size. There is  no left ventricular hypertrophy. Left ventricular diastolic parameters are indeterminate. Right Ventricle: The right ventricular size is normal. No increase in right ventricular wall thickness. Right ventricular systolic function is normal. Tricuspid regurgitation signal is  inadequate for assessing PA pressure. Left Atrium: Left atrial size was mild to moderately dilated. Right Atrium: Right atrial size was normal in size. Pericardium: There is no evidence of pericardial effusion. Presence of epicardial fat layer. Mitral Valve: The mitral valve is normal  in structure. Mild mitral valve regurgitation. No evidence of mitral valve stenosis. Tricuspid Valve: The tricuspid valve is normal in structure. Tricuspid valve regurgitation is trivial. No evidence of tricuspid stenosis. Aortic Valve: The aortic valve has an indeterminant number of cusps. There is mild calcification of the aortic valve. Aortic valve regurgitation is not visualized. No aortic stenosis is present. Pulmonic Valve: The pulmonic valve was not well visualized. Pulmonic valve regurgitation is not visualized. No evidence of pulmonic stenosis. Aorta: The aortic root, ascending aorta, aortic arch and descending aorta are all structurally normal, with no evidence of dilitation or obstruction. Venous: The inferior vena cava is dilated in size with greater than 50% respiratory variability, suggesting right atrial pressure of 8 mmHg. IAS/Shunts: The atrial septum is grossly normal.  LEFT VENTRICLE PLAX 2D LVIDd:         5.10 cm   Diastology LVIDs:         3.60 cm   LV e' medial:  5.40 cm/s LV PW:         0.90 cm   LV e' lateral: 13.20 cm/s LV IVS:        0.90 cm LVOT diam:     2.30 cm LV SV:         69 LV SV Index:   41 LVOT Area:     4.15 cm?  RIGHT VENTRICLE             IVC RV Basal diam:  2.80 cm     IVC diam: 2.10 cm RV S prime:     12.10 cm/s TAPSE (M-mode): 1.8 cm LEFT ATRIUM             Index        RIGHT ATRIUM           Index LA diam:        3.40 cm 2.00 cm/m?   RA Area:     14.60 cm? LA Vol (A2C):   55.1 ml 32.33 ml/m?  RA Volume:   34.60 ml  20.30 ml/m? LA Vol (A4C):   62.4 ml 36.62 ml/m? LA Biplane Vol: 61.8 ml 36.26 ml/m?  AORTIC VALVE LVOT Vmax:   81.90 cm/s LVOT Vmean:  55.900 cm/s LVOT VTI:    0.167 m  AORTA Ao  Root diam: 3.60 cm Ao Asc diam:  3.70 cm  SHUNTS Systemic VTI:  0.17 m Systemic Diam: 2.30 cm Buford Dresser MD Electronically signed by Buford Dresser MD Signature Date/Time: 06/01/2021/7:30:34 PM    F

## 2021-06-03 ENCOUNTER — Inpatient Hospital Stay (HOSPITAL_COMMUNITY): Payer: Medicare HMO

## 2021-06-03 DIAGNOSIS — K922 Gastrointestinal hemorrhage, unspecified: Secondary | ICD-10-CM | POA: Diagnosis not present

## 2021-06-03 DIAGNOSIS — J9621 Acute and chronic respiratory failure with hypoxia: Secondary | ICD-10-CM | POA: Diagnosis not present

## 2021-06-03 LAB — IRON AND TIBC
Iron: 57 ug/dL (ref 45–182)
Saturation Ratios: 17 % — ABNORMAL LOW (ref 17.9–39.5)
TIBC: 335 ug/dL (ref 250–450)
UIBC: 278 ug/dL

## 2021-06-03 LAB — COMPREHENSIVE METABOLIC PANEL
ALT: 12 U/L (ref 0–44)
AST: 7 U/L — ABNORMAL LOW (ref 15–41)
Albumin: 1.7 g/dL — ABNORMAL LOW (ref 3.5–5.0)
Alkaline Phosphatase: 73 U/L (ref 38–126)
Anion gap: 5 (ref 5–15)
BUN: 42 mg/dL — ABNORMAL HIGH (ref 6–20)
CO2: 29 mmol/L (ref 22–32)
Calcium: 7.6 mg/dL — ABNORMAL LOW (ref 8.9–10.3)
Chloride: 98 mmol/L (ref 98–111)
Creatinine, Ser: 0.72 mg/dL (ref 0.61–1.24)
GFR, Estimated: >60 mL/min (ref >60)
Glucose, Bld: 136 mg/dL — ABNORMAL HIGH (ref 70–99)
Potassium: 4.5 mmol/L (ref 3.5–5.1)
Sodium: 132 mmol/L — ABNORMAL LOW (ref 135–145)
Total Bilirubin: 0.6 mg/dL (ref 0.3–1.2)
Total Protein: 4.5 g/dL — ABNORMAL LOW (ref 6.5–8.1)

## 2021-06-03 LAB — CBC WITH DIFFERENTIAL/PLATELET
Abs Immature Granulocytes: 0.08 10*3/uL — ABNORMAL HIGH (ref 0.00–0.07)
Abs Immature Granulocytes: 0.16 10*3/uL — ABNORMAL HIGH (ref 0.00–0.07)
Basophils Absolute: 0 10*3/uL (ref 0.0–0.1)
Basophils Absolute: 0 10*3/uL (ref 0.0–0.1)
Basophils Relative: 0 %
Basophils Relative: 0 %
Eosinophils Absolute: 0 10*3/uL (ref 0.0–0.5)
Eosinophils Absolute: 0 10*3/uL (ref 0.0–0.5)
Eosinophils Relative: 0 %
Eosinophils Relative: 0 %
HCT: 29.8 % — ABNORMAL LOW (ref 39.0–52.0)
HCT: 28 % — ABNORMAL LOW (ref 39.0–52.0)
Hemoglobin: 8.5 g/dL — ABNORMAL LOW (ref 13.0–17.0)
Hemoglobin: 8 g/dL — ABNORMAL LOW (ref 13.0–17.0)
Immature Granulocytes: 1 %
Immature Granulocytes: 1 %
Lymphocytes Relative: 9 %
Lymphocytes Relative: 11 %
Lymphs Abs: 1.2 10*3/uL (ref 0.7–4.0)
Lymphs Abs: 2.1 10*3/uL (ref 0.7–4.0)
MCH: 20.5 pg — ABNORMAL LOW (ref 26.0–34.0)
MCH: 20.6 pg — ABNORMAL LOW (ref 26.0–34.0)
MCHC: 28.5 g/dL — ABNORMAL LOW (ref 30.0–36.0)
MCHC: 28.6 g/dL — ABNORMAL LOW (ref 30.0–36.0)
MCV: 72 fL — ABNORMAL LOW (ref 80.0–100.0)
MCV: 72 fL — ABNORMAL LOW (ref 80.0–100.0)
Monocytes Absolute: 0.8 10*3/uL (ref 0.1–1.0)
Monocytes Absolute: 1.2 10*3/uL — ABNORMAL HIGH (ref 0.1–1.0)
Monocytes Relative: 6 %
Monocytes Relative: 7 %
Neutro Abs: 11.4 10*3/uL — ABNORMAL HIGH (ref 1.7–7.7)
Neutro Abs: 15.3 10*3/uL — ABNORMAL HIGH (ref 1.7–7.7)
Neutrophils Relative %: 84 %
Neutrophils Relative %: 81 %
Platelets: 422 10*3/uL — ABNORMAL HIGH (ref 150–400)
Platelets: 420 10*3/uL — ABNORMAL HIGH (ref 150–400)
RBC: 4.14 MIL/uL — ABNORMAL LOW (ref 4.22–5.81)
RBC: 3.89 MIL/uL — ABNORMAL LOW (ref 4.22–5.81)
RDW: 19.8 % — ABNORMAL HIGH (ref 11.5–15.5)
RDW: 19.9 % — ABNORMAL HIGH (ref 11.5–15.5)
WBC: 13.5 10*3/uL — ABNORMAL HIGH (ref 4.0–10.5)
WBC: 18.8 10*3/uL — ABNORMAL HIGH (ref 4.0–10.5)
nRBC: 0 % (ref 0.0–0.2)
nRBC: 0 % (ref 0.0–0.2)

## 2021-06-03 LAB — RETICULOCYTES
Immature Retic Fract: 20.1 % — ABNORMAL HIGH (ref 2.3–15.9)
RBC.: 4.12 MIL/uL — ABNORMAL LOW (ref 4.22–5.81)
Retic Count, Absolute: 70 10*3/uL (ref 19.0–186.0)
Retic Ct Pct: 1.7 % (ref 0.4–3.1)

## 2021-06-03 LAB — BLOOD GAS, ARTERIAL
Acid-Base Excess: 5.3 mmol/L — ABNORMAL HIGH (ref 0.0–2.0)
Bicarbonate: 30.5 mmol/L — ABNORMAL HIGH (ref 20.0–28.0)
Drawn by: 36277
O2 Saturation: 95.2 %
Patient temperature: 37
pCO2 arterial: 46 mmHg (ref 32–48)
pH, Arterial: 7.43 (ref 7.35–7.45)
pO2, Arterial: 71 mmHg — ABNORMAL LOW (ref 83–108)

## 2021-06-03 LAB — GLUCOSE, CAPILLARY
Glucose-Capillary: 113 mg/dL — ABNORMAL HIGH (ref 70–99)
Glucose-Capillary: 132 mg/dL — ABNORMAL HIGH (ref 70–99)
Glucose-Capillary: 137 mg/dL — ABNORMAL HIGH (ref 70–99)
Glucose-Capillary: 140 mg/dL — ABNORMAL HIGH (ref 70–99)
Glucose-Capillary: 148 mg/dL — ABNORMAL HIGH (ref 70–99)
Glucose-Capillary: 150 mg/dL — ABNORMAL HIGH (ref 70–99)
Glucose-Capillary: 169 mg/dL — ABNORMAL HIGH (ref 70–99)

## 2021-06-03 LAB — CBC
HCT: 37.8 % — ABNORMAL LOW (ref 39.0–52.0)
Hemoglobin: 10.7 g/dL — ABNORMAL LOW (ref 13.0–17.0)
MCH: 20.3 pg — ABNORMAL LOW (ref 26.0–34.0)
MCHC: 28.3 g/dL — ABNORMAL LOW (ref 30.0–36.0)
MCV: 71.9 fL — ABNORMAL LOW (ref 80.0–100.0)
Platelets: 348 10*3/uL (ref 150–400)
RBC: 5.26 MIL/uL (ref 4.22–5.81)
RDW: 20.4 % — ABNORMAL HIGH (ref 11.5–15.5)
WBC: 13.3 10*3/uL — ABNORMAL HIGH (ref 4.0–10.5)
nRBC: 0 % (ref 0.0–0.2)

## 2021-06-03 LAB — PROTIME-INR
INR: 1.2 (ref 0.8–1.2)
Prothrombin Time: 15 seconds (ref 11.4–15.2)

## 2021-06-03 LAB — BASIC METABOLIC PANEL
Anion gap: 6 (ref 5–15)
BUN: 35 mg/dL — ABNORMAL HIGH (ref 6–20)
CO2: 30 mmol/L (ref 22–32)
Calcium: 7.5 mg/dL — ABNORMAL LOW (ref 8.9–10.3)
Chloride: 95 mmol/L — ABNORMAL LOW (ref 98–111)
Creatinine, Ser: 0.65 mg/dL (ref 0.61–1.24)
GFR, Estimated: >60 mL/min (ref >60)
Glucose, Bld: 137 mg/dL — ABNORMAL HIGH (ref 70–99)
Potassium: 4.4 mmol/L (ref 3.5–5.1)
Sodium: 131 mmol/L — ABNORMAL LOW (ref 135–145)

## 2021-06-03 LAB — PREPARE RBC (CROSSMATCH)

## 2021-06-03 LAB — VITAMIN B12: Vitamin B-12: 360 pg/mL (ref 180–914)

## 2021-06-03 LAB — FERRITIN: Ferritin: 15 ng/mL — ABNORMAL LOW (ref 24–336)

## 2021-06-03 MED ORDER — OXYCODONE-ACETAMINOPHEN 10-325 MG PO TABS: 1.0000 | ORAL_TABLET | Freq: Four times a day (QID) | ORAL | 0 refills | Status: DC | PRN

## 2021-06-03 MED ORDER — SODIUM CHLORIDE 0.9 % IV BOLUS
1000.0000 mL | INTRAVENOUS | Status: AC
  Administered 2021-06-03: 1000 mL via INTRAVENOUS

## 2021-06-03 MED ORDER — PANTOPRAZOLE SODIUM 40 MG IV SOLR
40.0000 mg | Freq: Two times a day (BID) | INTRAVENOUS | Status: DC
  Administered 2021-06-03: 40 mg via INTRAVENOUS
  Filled 2021-06-03: qty 10

## 2021-06-03 MED ORDER — PREDNISONE 10 MG PO TABS: ORAL_TABLET | ORAL | 0 refills | Status: DC

## 2021-06-03 MED ORDER — GABAPENTIN 100 MG PO CAPS
100.0000 mg | ORAL_CAPSULE | Freq: Three times a day (TID) | ORAL | Status: DC
Start: 2021-06-03 — End: 2021-06-15
  Administered 2021-06-04 – 2021-06-15 (×34): 100 mg via ORAL
  Filled 2021-06-03 (×34): qty 1

## 2021-06-03 MED ORDER — NITROGLYCERIN 0.4 MG SL SUBL: 0.4000 mg | SUBLINGUAL_TABLET | SUBLINGUAL | 0 refills | Status: AC | PRN

## 2021-06-03 MED ORDER — NOREPINEPHRINE 4 MG/250ML-% IV SOLN
0.0000 ug/min | INTRAVENOUS | Status: DC
  Filled 2021-06-03: qty 250

## 2021-06-03 MED ORDER — CYANOCOBALAMIN 1000 MCG/ML IJ SOLN
1000.0000 ug | Freq: Once | INTRAMUSCULAR | Status: AC
  Administered 2021-06-03: 1000 ug via SUBCUTANEOUS
  Filled 2021-06-03: qty 1

## 2021-06-03 MED ORDER — SACCHAROMYCES BOULARDII 250 MG PO CAPS
250.0000 mg | ORAL_CAPSULE | Freq: Two times a day (BID) | ORAL | 0 refills | Status: AC
Start: 2021-06-03 — End: ?

## 2021-06-03 MED ORDER — PANTOPRAZOLE SODIUM 40 MG IV SOLR: 40.0000 mg | Freq: Two times a day (BID) | INTRAVENOUS | Status: DC

## 2021-06-03 MED ORDER — JUVEN PO PACK: 1.0000 | PACK | Freq: Two times a day (BID) | ORAL | 0 refills | Status: AC

## 2021-06-03 MED ORDER — SACCHAROMYCES BOULARDII 250 MG PO CAPS
250.0000 mg | ORAL_CAPSULE | Freq: Two times a day (BID) | ORAL | Status: DC
  Administered 2021-06-03 – 2021-06-15 (×24): 250 mg via ORAL
  Filled 2021-06-03 (×28): qty 1

## 2021-06-03 MED ORDER — DOXYCYCLINE HYCLATE 100 MG PO TABS: 100.0000 mg | ORAL_TABLET | Freq: Two times a day (BID) | ORAL | 0 refills | Status: DC

## 2021-06-03 MED ORDER — SODIUM CHLORIDE 0.9 % IV SOLN: INTRAVENOUS | Status: DC

## 2021-06-03 MED ORDER — ENSURE ENLIVE PO LIQD: 237.0000 mL | Freq: Three times a day (TID) | ORAL | 0 refills | Status: AC

## 2021-06-03 MED ORDER — SODIUM CHLORIDE 0.9% IV SOLUTION: Freq: Once | INTRAVENOUS | Status: AC

## 2021-06-03 MED ORDER — SODIUM CHLORIDE 0.9 % IV BOLUS
500.0000 mL | Freq: Once | INTRAVENOUS | Status: AC
  Administered 2021-06-03: 500 mL via INTRAVENOUS

## 2021-06-03 MED ORDER — ADULT MULTIVITAMIN W/MINERALS CH: 1.0000 | ORAL_TABLET | Freq: Every day | ORAL | 0 refills | Status: AC

## 2021-06-03 MED ORDER — NICOTINE 21 MG/24HR TD PT24
21.0000 mg | MEDICATED_PATCH | Freq: Every day | TRANSDERMAL | 0 refills | Status: AC
Start: 2021-06-03 — End: ?

## 2021-06-03 MED ORDER — PANTOPRAZOLE 80MG IVPB - SIMPLE MED
80.0000 mg | Freq: Once | INTRAVENOUS | Status: DC
  Filled 2021-06-03: qty 100

## 2021-06-03 MED ORDER — LOPERAMIDE HCL 2 MG PO CAPS
2.0000 mg | ORAL_CAPSULE | ORAL | Status: DC | PRN
  Administered 2021-06-03 – 2021-06-14 (×4): 2 mg via ORAL
  Filled 2021-06-03 (×4): qty 1

## 2021-06-03 MED ORDER — GUAIFENESIN ER 600 MG PO TB12
600.0000 mg | ORAL_TABLET | Freq: Two times a day (BID) | ORAL | 0 refills | Status: AC
Start: 2021-06-03 — End: 2021-07-04

## 2021-06-03 MED ORDER — ROSUVASTATIN CALCIUM 40 MG PO TABS: 40.0000 mg | ORAL_TABLET | Freq: Every day | ORAL | 0 refills | Status: AC

## 2021-06-03 MED ORDER — PANTOPRAZOLE INFUSION (NEW) - SIMPLE MED
8.0000 mg/h | INTRAVENOUS | Status: DC
  Administered 2021-06-03 – 2021-06-04 (×2): 8 mg/h via INTRAVENOUS
  Filled 2021-06-03 (×2): qty 80
  Filled 2021-06-03: qty 100

## 2021-06-03 NOTE — Progress Notes (Signed)
?  Patient Name: Raymond [name]   ?MRN: [id number]   ?Date of Birth/ Sex: 1960-11-09 , male   ?   ?Admission Date: 05/31/2021  ?Attending Provider: Lavina Hamman, MD  ?Primary Diagnosis: Acute and chronic respiratory failure with hypoxia ([place]) ?  ? ?Indication: ?Pt was in his usual state of health until this PM, when he was noted to be unresponsive. Code blue was subsequently called. At the time of arrival on scene, patient was minimally responsive and pulses palpable.  ?Technical Description:  ?- CPR performance duration:  NA   ?- Was defibrillation or cardioversion used? No   ?- Was external pacer placed? No  ?- Was patient intubated pre/post CPR? No  ? ?Medications Administered: Y = Yes; Blank = No ?Amiodarone    ?Atropine    ?Calcium    ?Epinephrine    ?Lidocaine    ?Magnesium    ?Norepinephrine    ?Phenylephrine    ?Sodium bicarbonate    ?Vasopressin    ? ?Post CPR evaluation:  ?- Final Status - Was patient successfully resuscitated ? Yes ?- What is current rhythm? NSR ?- What is current hemodynamic status? Stable ? ?Miscellaneous Information:  ?- Labs sent, including: CBC, CMP, ABG, CXR  ?- Primary team notified?  Yes  ?- Family Notified? No  ?- Additional notes/ transfer status: NA  ?   ?Idamae Schuller, MD  ?06/03/2021, 10:40 PM  ?

## 2021-06-03 NOTE — TOC Progression Note (Signed)
Transition of Care (TOC) - Progression Note  ? ? ?Patient Details  ?Name: TAMOTSU WIEDERHOLT ?MRN: [id number] ?Date of Birth: 1961/01/15 ? ?Transition of Care (TOC) CM/SW Contact  ?Bary Castilla, LCSW ?Phone Number:[phone] ?06/03/2021, 3:23 PM ? ?Clinical Narrative:    ?CSW had not received a call back from [facility] therefore CSW completed a follow up phone call to ascertain eligibility. CSW was notified that pt is managed by [facility] and not [facility] therefore facility will need to start authorization. ? ?TOC team will continue to assist with discharge planning needs.  ? ? ?Expected Discharge Plan: [place] ?Barriers to Discharge: Continued Medical Work up ? ?Expected Discharge Plan and Services ?Expected Discharge Plan: [place] ?In-house Referral: Clinical Social Work ?  ?  ?Living arrangements for the past 2 months: [place] ?                ?  ?  ?  ?  ?  ?  ?  ?  ?  ?  ? ? ?Social Determinants of Health (SDOH) Interventions ?  ? ?Readmission Risk Interventions ? ?  12/13/2020  ?  2:02 PM  ?Readmission Risk Prevention Plan  ?Transportation Screening Complete  ?PCP or Specialist Appt within 5-7 Days Complete  ?Home Care Screening Complete  ?Medication Review (RN CM) Complete  ? ? ?

## 2021-06-03 NOTE — Consult Note (Signed)
? ?NAME:  Raymond [name], MRN:  [id number], DOB:  1961-01-05, LOS: 2 ?ADMISSION DATE:  05/31/2021, CONSULTATION DATE:  06/03/21 ?REFERRING MD:  [place] Crane, CHIEF COMPLAINT:  Hypotension with acute gib  ? ?History of Present Illness:  ?61 yo with pmh CAD s/p PCI, COPD, Chronic hypoxic resp failure, GERD, tobacco abuse, t2dm  presented originally on 05/31/21 with acute onset chest pain and aecopd. He underwent cardiac catheterization on 3/24 which revealed non obstructive cad. He was maintained on dapt and was planning for d/c however on 3/26 pt was noted to have black bm with trachycardia and some relative hypotension per chart review.  ? ?Gi was reportedly consulted this evening and had recommended stopping plavix. He was maintained on asa. Unfortunately pt cont to have decreasing hgb and worsening hemodynamics. Hgb down 2 gm and sbp 80's with tachycardia. TRH has reached back out to GI and started pt on protonix gtt, given ivf and held asa. They have consulted CCM for transfer to ICU at this time.  ? ?GI recs or plan pending at this time.  ? ?Pertinent  Medical History  ?As above ? ?Significant Hospital Events: ?Including procedures, antibiotic start and stop dates in addition to other pertinent events   ?3/24 cardiac catheterization ?3/26 acute gib, hypotensive, transferring to ICU ? ? ?Interim History / Subjective:  ?As above ? ?Objective   ?Blood pressure (!) 80/53, pulse (!) 104, temperature (!) 97.3 ?F (36.3 ?C), temperature source Oral, resp. rate 10, height '5\' 7"'$  (1.702 m), weight 60.6 kg, SpO2 100 %. ?   ?   ? ?Intake/Output Summary (Last 24 hours) at 06/03/2021 2246 ?Last data filed at 06/03/2021 1545 ?Gross per 24 hour  ?Intake 240 ml  ?Output 1403 ml  ?Net -1163 ml  ? ?Filed Weights  ? 05/31/21 2013 05/31/21 2039  ?Weight: 59.4 kg 60.6 kg  ? ? ?Examination: ?General: arousable and able to converse. Appears acutely ill ?HENT: NCAT, eomi, poor dentition, mm dry and pale ?Lungs: diminished bilaterally ?Cardiovascular:  tachycardic ?Abdomen: soft, mildly ttp non distended, bs + ?Extremities: no c/c/e, muscle wasting in all 4 extremities, R foot with bandage intact ?Neuro: no focal deficits, arousable and converses, follows commands ?GU: deferred ? ?Resolved Hospital Problem list   ? ? ?Assessment & Plan:  ?Acute gib:  ?-await gi recs ?-appears cta has been ordered but pt not stable for this at this time.  ?-will need volume resuscitation ?-q4h h&h ?-maintain protonix gtt ?-stop a/c and dapt ?-check coags ? ?Shock, suspected hemorrhagic:  ?-transfuse as needed with recent nstemi <8 ?-low dose pressors as needed ?-volume resuscitate ? ?Cad s/p pci:  ?-h/o, no recent intervention ?-unfortunately having to hold any dapt or a/c therapies ?-high risk for further cardiac compromise ? ?Copd:  ?-with recent aecopd ?-monitor ?-nebs prn ?-to complete doxy course ? ?Chronic hypoxic resp failure:  ?-on 2L and utilizing at this time  ? ?GERD:  ?-on ppi gtt ?-awaiting GI eval ? ?T2dm:  ?-monitor BS but pt is NPO so will hold on scheduled insulin ? ? ?Best Practice (right click and "Reselect all SmartList Selections" daily)  ? ?Diet/type: NPO ?DVT prophylaxis: other ?GI prophylaxis: PPI ?Lines: N/A ?Foley:  N/A ?Code Status:  full code ?Last date of multidisciplinary goals of care discussion [awaiting family contact] ? ?Labs   ?CBC: ?Recent Labs  ?Lab 06/01/21 ?4081 06/02/21 ?4481 06/03/21 ?8563 06/03/21 ?1938  ?WBC 7.1 16.8* 13.3* 13.5*  ?NEUTROABS  --   --   --  11.4*  ?  HGB 9.3* 9.3* 10.7* 8.5*  ?HCT 32.0* 32.2* 37.8* 29.8*  ?MCV 69.9* 70.9* 71.9* 72.0*  ?PLT 369 346 348 422*  ? ? ?Basic Metabolic Panel: ?Recent Labs  ?Lab 06/01/21 ?2671 06/02/21 ?2458 06/03/21 ?1938  ?NA 132* 132* 131*  ?K 4.3 4.1 4.4  ?CL 96* 99 95*  ?CO2 '28 27 30  '$ ?GLUCOSE 139* 107* 137*  ?BUN 9 9 35*  ?CREATININE 0.59* 0.61 0.65  ?CALCIUM 7.6* 7.7* 7.5*  ? ?GFR: ?Estimated Creatinine Clearance: 84.2 mL/min (by C-G formula based on SCr of 0.65 mg/dL). ?Recent Labs  ?Lab  06/01/21 ?0998 06/02/21 ?3382 06/03/21 ?5053 06/03/21 ?1938  ?WBC 7.1 16.8* 13.3* 13.5*  ? ? ?Liver Function Tests: ?No results for input(s): AST, ALT, ALKPHOS, BILITOT, PROT, ALBUMIN in the last 168 hours. ?No results for input(s): LIPASE, AMYLASE in the last 168 hours. ?No results for input(s): AMMONIA in the last 168 hours. ? ?ABG ?   ?Component Value Date/Time  ? PHART 7.433 12/08/2020 1312  ? PCO2ART 42.0 12/08/2020 1312  ? PO2ART 80 (L) 12/08/2020 1312  ? HCO3 28.2 (H) 12/08/2020 1312  ? TCO2 29 12/08/2020 1312  ? ACIDBASEDEF 0.1 03/20/2007 0820  ? O2SAT 96.0 12/08/2020 1312  ?  ? ?Coagulation Profile: ?No results for input(s): INR, PROTIME in the last 168 hours. ? ?Cardiac Enzymes: ?No results for input(s): CKTOTAL, CKMB, CKMBINDEX, TROPONINI in the last 168 hours. ? ?HbA1C: ?Hemoglobin-A1c  ?Date/Time Value Ref Range Status  ?09/25/2012 03:54 PM <0.40  Final  ? ?Hgb A1c MFr Bld  ?Date/Time Value Ref Range Status  ?05/31/2021 09:45 PM 5.1 4.8 - 5.6 % Final  ?  Comment:  ?  (NOTE) ?Pre diabetes:          5.7%-6.4% ? ?Diabetes:              >6.4% ? ?Glycemic control for   <7.0% ?adults with diabetes ?  ?12/06/2020 07:58 PM 5.5 4.8 - 5.6 % Final  ?  Comment:  ?  (NOTE) ?Pre diabetes:          5.7%-6.4% ? ?Diabetes:              >6.4% ? ?Glycemic control for   <7.0% ?adults with diabetes ?  ? ? ?CBG: ?Recent Labs  ?Lab 06/03/21 ?9767 06/03/21 ?0813 06/03/21 ?1204 06/03/21 ?1712 06/03/21 ?2003  ?GLUCAP 132* 137* 169* 140* 113*  ? ? ?Review of Systems:   ?Pain everywhere, from chest down to toes. Nausea, vomiting dark black contents. Black stool. Denies bright red blood. Denies previous events. No recent illnesses. R foot wound issue x2 months.  ? ?Past Medical History:  ?He,  has a past medical history of Anxiety, Back pain, Bipolar disorder ([place]), Borderline hypertension, Chronic pain, COPD (chronic obstructive pulmonary disease) ([place]), Depression with anxiety, GERD (gastroesophageal reflux disease), Headache,  Hip pain, Hypercholesterolemia, Stroke ([place]), and Tobacco use.  ? ?Surgical History:  ? ?Past Surgical History:  ?Procedure Laterality Date  ? AMPUTATION Right 12/08/2020  ? Procedure: RAY AMPUTATION OF RIGHT FIFTH TOE;  Surgeon: Angelia Mould, MD;  Location[facility] OR;  Service: Vascular;  Laterality: Right;  ? ANGIOPLASTY ILLIAC ARTERY Right 12/08/2020  ? Procedure: ILIAC ANGIOPLASTY WITH INSERTION OF RIGHT COMMON ILIAC 8MM X 79MM VIABAHN STENT;  Surgeon: Angelia Mould, MD;  Location: [place];  Service: Vascular;  Laterality: Right;  ? ANTERIOR FUSION CERVICAL SPINE    ? BACK SURGERY    ? CHOLECYSTECTOMY    ? COLONOSCOPY N/A 10/28/2012  ?  HKV:QQVZDG polyp-hyperplastic. Internal hemorrhoids. Distal 5 cm of terminal ileum appeared normal.  ? CORONARY ANGIOPLASTY WITH STENT PLACEMENT  1980s  ? ENDARTERECTOMY FEMORAL Right 12/08/2020  ? Procedure: RIGHT FEMORAL ENDARTERECTOMY WITH ILIOFEMORAL THROMBECTOMY;  Surgeon: Angelia Mould, MD;  Location: [facility] OR;  Service: Vascular;  Laterality: Right;  ? ESOPHAGOGASTRODUODENOSCOPY N/A 10/28/2012  ? LOV:FIEPPIRJ gastric mucosa with mottling and submucosal petechiae. Mild chronic gastritis but no H. pylori home path. Small hiatal hernia. Duodenal lipoma  ? EYE SURGERY  Left eye  ? as a child  ? FEMORAL-POPLITEAL BYPASS GRAFT Right 12/08/2020  ? Procedure: RIGHT FEMORAL-BELOW KNEE POPLITEAL ARTERY BYPASS GRAFT;  Surgeon: Angelia Mould, MD;  Location: [facility] OR;  Service: Vascular;  Laterality: Right;  ? LUMBAR SPINE SURGERY    ? 3 x   ?  ? ?Social History:  ? reports that he has been smoking cigarettes. He has a 45.00 pack-year smoking history. He has never used smokeless tobacco. He reports that he does not drink alcohol and does not use drugs.  ? ?Family History:  ?His family history includes Stomach cancer in his mother; Throat cancer in his father. There is no history of Colon cancer.  ? ?Allergies ?Allergies  ?Allergen Reactions  ? Morphine And Related  Shortness Of Breath and Palpitations  ? Penicillins Anaphylaxis  ? Vicodin [Hydrocodone-Acetaminophen] Shortness Of Breath and Palpitations  ? Latex Hives and Rash  ?  ? ?Home Medications  ?Prior to Admission medi

## 2021-06-03 NOTE — Progress Notes (Addendum)
Received a page from resident internal medicine regarding the patient being minimal responsive.  Presented at bedside.  Patient is lethargic.  Having ground coffee emesis.  States he hurts from his chest all the way down to his toes.  A CODE BLUE was called minutes earlier.  Per IM resident patient was minimally responsive however he had palpable pulses.  There were no chest compressions.   ? ?1 L normal saline IV fluid bolus ordered due to hypotension. ? ?Consulted PCCM, Dr. Ruthann Cancer, due to concern for hemodynamic instability with ongoing GI bleed and hypotension. ?GI Dr. Watt Climes made aware. ? ?Total 2 units PRBCs ordered to be transfused at the time of this dictation. ?

## 2021-06-03 NOTE — Progress Notes (Addendum)
Received a message from bedside RN regarding the patient's hemoglobin level drop 8.5K from 10.7K and recurrent ground coffee emesis and melena after starting night shift.  ? ?Reviewed the patient's chart, patient has a history of mild gastritis seen on EGD done in 2014.  Patient recently started on aspirin and Plavix post left heart cath, no stent placement.  Plavix was discontinued earlier.  Discontinued aspirin tonight after discussing with GI, Dr. Watt Climes, and cardiology, Dr. Alfred Levins.   ? ?Due to concern for upper GI bleed, made the patient n.p.o., ordered stat CTA GI bleed, Protonix drip, 1 unit PRBCs to be transfused.  Continue IV fluid hydration to maintain MAP greater than 65. ? ?We will continue to closely monitor and treat as indicated. ? ?GI has been consulted and will see in consultation.  We will keep n.p.o. for possible endoscopy in the next 12 to 24 hours. ? ? ?

## 2021-06-03 NOTE — Progress Notes (Signed)
?   06/03/21 1507  ?Assess: MEWS Score  ?Temp 98.5 ?F (36.9 ?C)  ?BP 91/66  ?Pulse Rate (!) 101  ?ECG Heart Rate (!) 101  ?Resp 20  ?Level of Consciousness Alert  ?SpO2 92 %  ?O2 Device Nasal Cannula  ?O2 Flow Rate (L/min) 3 L/min  ?Assess: MEWS Score  ?MEWS Temp 0  ?MEWS Systolic 1  ?MEWS Pulse 1  ?MEWS RR 0  ?MEWS LOC 0  ?MEWS Score 2  ?MEWS Score Color Yellow  ?Assess: if the MEWS score is Yellow or Red  ?Were vital signs taken at a resting state? Yes  ?Focused Assessment No change from prior assessment  ?Early Detection of Sepsis Score *See Row Information* Medium  ?MEWS guidelines implemented *See Row Information* Yes  ?Take Vital Signs  ?Increase Vital Sign Frequency  Yellow: Q 2hr X 2 then Q 4hr X 2, if remains yellow, continue Q 4hrs  ?Escalate  ?MEWS: Escalate Yellow: discuss with charge nurse/RN and consider discussing with provider and RRT  ?Notify: Charge Nurse/RN  ?Name of Charge Nurse/RN Notified Brooke, RN  ?Date Charge Nurse/RN Notified 06/03/21  ?Time Charge Nurse/RN Notified 1510  ?Notify: Provider  ?Provider Name/Title Diamantina Providence Patel/ MD  ?Date Provider Notified 06/03/21  ?Time Provider Notified [phone]  ?Notification Type  ?(Secure chat)  ?Notification Reason Change in status  ?Provider response See new orders  ?Date of Provider Response 06/03/21  ?Time of Provider Response [phone]  ?Document  ?Patient Outcome Stabilized after interventions  ?Progress note created (see row info) Yes  ? ? ?

## 2021-06-03 NOTE — Discharge Summary (Signed)
?Physician Discharge Summary ?  ?Patient: Raymond [name] MRN: [id number] DOB: 1961/01/20  ?Admit date:     05/31/2021  ?Discharge date:   ?Discharge Physician: Berle Mull  ?PCP: [name], MD ? ?Recommendations at discharge: ? Follow up with PCP in 1 week ? ?Discharge Diagnoses: ?Principal Problem: ?  Acute and chronic respiratory failure with hypoxia (HCC) ?Active Problems: ?  COPD with acute exacerbation ([place]) ?  CAP (community acquired pneumonia) ?  NSTEMI (non-ST elevated myocardial infarction) ([place]) ?  Dyslipidemia ?  Depression ?  Spinal stenosis, lumbar region, with neurogenic claudication ?  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  Tobacco use ?  GERD without esophagitis ?  Pressure injury of skin ?  Protein-calorie malnutrition, severe ? ? ?Hospital Course: ?Past medical history of CAD SP PCI, COPD, chronic respiratory failure, GERD, active smoker, PVD, type II DM.  Presents with complaints of chest pain and elevated troponin as well as COPD exacerbation. ?Cardiology was consulted.  Underwent cardiac catheterization on 3/24 which showed nonobstructive CAD.  Medical management recommended. ?Continues to have ongoing COPD exacerbation with hypoxia ? ?Assessment and Plan: ?* Acute and chronic respiratory failure with hypoxia (HCC) ?COPD with acute exacerbation secondary to community-acquired pneumonia. ?Patient uses 2 L of oxygen at home. ?Was requiring 4 to 5 L of oxygen.  Now able to be weaned off to 3 L of oxygen. ?Chest x-ray shows evidence of left lower lobe pneumonia. ?No cultures performed on admission.  Currently on doxycycline. ?Continue nebulizer therapy as well as steroids. ? ?NSTEMI (non-ST elevated myocardial infarction) ([place]) ?HLD. ?HTN. ?Presented with complaints of chest tightness. ?Troponins were minimally elevated patient was started on IV heparin drip.  Cardiology considers this as non-STEMI. ?Underwent cardiac catheterization which shows nonobstructive  CAD. ?Echocardiogram performed, EF 55 to 60%. ?Currently patient will be on 81 mg aspirin, 75 mg Plavix, 12.5 mg twice daily metoprolol as well as 40 mg daily Crestor (Crestor dose increased from 10 mg). ?Cardiology currently signed off. ?Outpatient follow-up. ? ?Depression ?We will continue Wellbutrin XL. ? ?Spinal stenosis, lumbar region, with neurogenic claudication ?Patient has chronic pain and is on Percocet.  We will continue. ?Also on chronic gabapentin which we will continue. ?Patient reports that he had a fall before his September presentation and has developed some numbness in bilateral upper and lower extremity and is supposed to see neurosurgery outpatient. ?CT scan performed in September 2022 after the fall does not reveal any evidence of fracture or acute abnormality in his spine. ?On chronic percocet, will continue. ? 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  Monitor. ? ?Protein-calorie malnutrition, severe ?Body mass index is 20.94 kg/m?[name] ?Nutrition Problem: Severe Malnutrition (in the context of chronic illness) ?Etiology: poor appetite ?Nutrition Interventions: ?Interventions: Refer to RD note for recommendations  ? ?Pressure injury of skin ?Present on admission. ?Left lower buttock. ?Wound care consulted. ?For right toe ulcer, cleanse right lateral foot wound with saline, pat dry. Place a small piece of Xeroform gauze over the wound, then dry gauze, secure with a few turns of kerlix. ?  ?For the left buttock/hip wound, cleanse with saline, place a small piece of xeroform gauze over the wound, and cover with a foam dressing. Change both every 2 days. ? ?GERD without esophagitis ?Continue Protonix. ? ?Tobacco use ?Continue nicotine patch. ?Patient was counseled on tobacco cessation. ? ?Consultants: Cardiology  ?Procedures performed:  ?Echocardiogram  ?Cardiac Catheterization  ?DISCHARGE MEDICATION: ?Allergies as  of 06/03/2021   ? ?   Reactions   ? Morphine And Related Shortness Of Breath, Palpitations  ? Penicillins Anaphylaxis  ? Vicodin [hydrocodone-acetaminophen] Shortness Of Breath, Palpitations  ? Latex Hives, Rash  ? ?  ? ?  ?Medication List  ?  ? ?STOP taking these medications   ? ?ondansetron 4 MG tablet ?Commonly known as: ZOFRAN ?  ? ?  ? ?TAKE these medications   ? ?albuterol 0.63 MG/3ML nebulizer solution ?Commonly known as: ACCUNEB ?Take 1 ampule by nebulization every 6 (six) hours as needed for wheezing or shortness of breath. ?  ?albuterol 108 (90 Base) MCG/ACT inhaler ?Commonly known as: VENTOLIN HFA ?Inhale 2 puffs into the lungs every 6 (six) hours as needed for wheezing. ?  ?allopurinol 100 MG tablet ?Commonly known as: ZYLOPRIM ?Take 100 mg by mouth in the morning. ?  ?aspirin 81 MG tablet ?Take 81 mg by mouth daily. ?  ?buPROPion 150 MG 24 hr tablet ?Commonly known as: WELLBUTRIN XL ?Take 1 tablet by mouth daily at 6 (six) AM. ?  ?calcium carbonate 750 MG chewable tablet ?Commonly known as: TUMS EX ?Chew 2 tablets by mouth as needed for heartburn. ?  ?clopidogrel 75 MG tablet ?Commonly known as: PLAVIX ?Take 1 tablet (75 mg total) by mouth daily at 6 (six) AM. ?  ?doxycycline 100 MG tablet ?Commonly known as: VIBRA-TABS ?Take 1 tablet (100 mg total) by mouth 2 (two) times daily for 3 days. ?  ?Dulcolax Soft Chews 1200 MG Chew ?Generic drug: Magnesium Hydroxide ?Chew 1 tablet by mouth as needed (constipation). ?  ?feeding supplement Liqd ?Take 237 mLs by mouth 3 (three) times daily between meals. ?  ?nutrition supplement (JUVEN) Pack ?Take 1 packet by mouth 2 (two) times daily between meals. ?  ?gabapentin 300 MG capsule ?Commonly known as: NEURONTIN ?Take 300 mg by mouth 3 (three) times daily. ?  ?guaiFENesin 600 MG 12 hr tablet ?Commonly known as: [place] ?Take 1 tablet (600 mg total) by mouth 2 (two) times daily. ?  ?magnesium oxide 400 MG tablet ?Commonly known as: MAG-OX ?Take 1 tablet by mouth daily. ?  ?metFORMIN 500 MG  tablet ?Commonly known as: GLUCOPHAGE ?Take 2 tablets by mouth 2 (two) times daily. ?  ?metoprolol tartrate 25 MG tablet ?Commonly known as: LOPRESSOR ?TAKE 1/2 TABLET BY MOUTH TWICE DAILY Needs appointment for further refills ?What changed: See the new instructions. ?  ?multivitamin with minerals Tabs tablet ?Take 1 tablet by mouth daily. ?  ?nicotine 21 mg/24hr patch ?Commonly known as: NICODERM CQ - dosed in mg/24 hours ?Place 1 patch (21 mg total) onto the skin daily. ?  ?nitroGLYCERIN 0.4 MG SL tablet ?Commonly known as: NITROSTAT ?Place 1 tablet (0.4 mg total) under the tongue every 5 (five) minutes x 3 doses as needed for chest pain. ?  ?omeprazole 40 MG capsule ?Commonly known as: PRILOSEC ?Take 1 capsule by mouth daily at 6 (six) AM. ?  ?oxyCODONE-acetaminophen 10-325 MG tablet ?Commonly known as: PERCOCET ?Take 1-2 tablets by mouth 4 (four) times daily as needed for pain. max FIVE PER DAY ?  ?predniSONE 10 MG tablet ?Commonly known as: DELTASONE ?Take '40mg'$  daily for 3days,Take '30mg'$  daily for 3days,Take '20mg'$  daily for 3days,Take '10mg'$  daily for 3days, then stop ?  ?rosuvastatin 40 MG tablet ?Commonly known as: CRESTOR ?Take 1 tablet (40 mg total) by mouth daily. ?What changed:  ?medication strength ?how much to take ?when to take this ?  ?saccharomyces boulardii 250 MG capsule ?  Commonly known as: FLORASTOR ?Take 1 capsule (250 mg total) by mouth 2 (two) times daily. ?  ?silver sulfADIAZINE 1 % cream ?Commonly known as: SILVADENE ?Apply 1 application topically 2 (two) times daily. apply a 1/16 inch (1.5 mm) thick layer to entire burn area ?  ?tamsulosin 0.4 MG Caps capsule ?Commonly known as: FLOMAX ?Take 0.4 mg by mouth in the morning. ?  ? ?  ? ?  ?  ? ? ?  ?Discharge Care Instructions  ?(From admission, onward)  ?  ? ? ?  ? ?  Start     Ordered  ? 06/03/21 0000  Discharge wound care:       ?Comments: Cleanse right lateral foot wound with saline, pat dry. Place a small piece of Xeroform gauze over the  wound, then dry gauze, secure with a few turns of kerlix. ? ?For the left buttock/hip wound, cleanse with saline, place a small piece of xeroform gauze over the wound, and cover with a foam dressing. Change both every 2 days.

## 2021-06-03 NOTE — Progress Notes (Signed)
[facility] ?PROGRESS NOTE ? ?Patient: Raymond [name] WNI:[id number]   ?PCP: [name], MD DOB: 02-Feb-1961   ?DOA: 05/31/2021   DOS: 06/03/2021   ? ?Patient reported to the RN that he has some black color bowel movement.  I am unsure of the significance of this finding. ?Patient is more tachycardic and relatively hypotensive. ?We will check CBC, type and screen.  Hold heparin.  Hold Plavix.  Give IV Protonix.  Monitor. ?Earlier in response to hypotension patient's Percocet and gabapentin were reduced and patient was started on IV normal saline infusion. ? ?Author: ?Berle Mull, MD ?[facility] ?06/03/2021 5:58 PM   ?If 7PM-7AM, please contact night-coverage at [email or web address]  ?

## 2021-06-04 ENCOUNTER — Inpatient Hospital Stay (HOSPITAL_COMMUNITY): Payer: Medicare HMO

## 2021-06-04 ENCOUNTER — Encounter (HOSPITAL_COMMUNITY): Payer: Self-pay | Admitting: Cardiovascular Disease

## 2021-06-04 DIAGNOSIS — J9621 Acute and chronic respiratory failure with hypoxia: Secondary | ICD-10-CM | POA: Diagnosis not present

## 2021-06-04 LAB — PREPARE RBC (CROSSMATCH)

## 2021-06-04 LAB — OCCULT BLOOD X 1 CARD TO LAB, STOOL: Fecal Occult Bld: POSITIVE — AB

## 2021-06-04 LAB — BASIC METABOLIC PANEL
Anion gap: 6 (ref 5–15)
BUN: 58 mg/dL — ABNORMAL HIGH (ref 6–20)
CO2: 25 mmol/L (ref 22–32)
Calcium: 7.3 mg/dL — ABNORMAL LOW (ref 8.9–10.3)
Chloride: 103 mmol/L (ref 98–111)
Creatinine, Ser: 0.64 mg/dL (ref 0.61–1.24)
GFR, Estimated: >60 mL/min (ref >60)
Glucose, Bld: 120 mg/dL — ABNORMAL HIGH (ref 70–99)
Potassium: 5.4 mmol/L — ABNORMAL HIGH (ref 3.5–5.1)
Sodium: 134 mmol/L — ABNORMAL LOW (ref 135–145)

## 2021-06-04 LAB — CBC
HCT: 30.8 % — ABNORMAL LOW (ref 39.0–52.0)
Hemoglobin: 9.7 g/dL — ABNORMAL LOW (ref 13.0–17.0)
MCH: 24 pg — ABNORMAL LOW (ref 26.0–34.0)
MCHC: 31.5 g/dL (ref 30.0–36.0)
MCV: 76.2 fL — ABNORMAL LOW (ref 80.0–100.0)
Platelets: 290 10*3/uL (ref 150–400)
RBC: 4.04 MIL/uL — ABNORMAL LOW (ref 4.22–5.81)
RDW: 20.7 % — ABNORMAL HIGH (ref 11.5–15.5)
WBC: 13.3 10*3/uL — ABNORMAL HIGH (ref 4.0–10.5)
nRBC: 0 % (ref 0.0–0.2)

## 2021-06-04 LAB — POTASSIUM: Potassium: 5.1 mmol/L (ref 3.5–5.1)

## 2021-06-04 LAB — GLUCOSE, CAPILLARY: Glucose-Capillary: 133 mg/dL — ABNORMAL HIGH (ref 70–99)

## 2021-06-04 LAB — HEMOGLOBIN AND HEMATOCRIT, BLOOD
HCT: 32.9 % — ABNORMAL LOW (ref 39.0–52.0)
Hemoglobin: 10.2 g/dL — ABNORMAL LOW (ref 13.0–17.0)

## 2021-06-04 LAB — MAGNESIUM: Magnesium: 1.8 mg/dL (ref 1.7–2.4)

## 2021-06-04 MED ORDER — ORAL CARE MOUTH RINSE
15.0000 mL | Freq: Two times a day (BID) | OROMUCOSAL | Status: DC
  Administered 2021-06-04 – 2021-06-14 (×19): 15 mL via OROMUCOSAL

## 2021-06-04 MED ORDER — PREDNISONE 20 MG PO TABS
40.0000 mg | ORAL_TABLET | Freq: Every day | ORAL | Status: DC
  Administered 2021-06-05 – 2021-06-07 (×3): 40 mg via ORAL
  Filled 2021-06-04 (×3): qty 2

## 2021-06-04 MED ORDER — SODIUM CHLORIDE 0.9% IV SOLUTION: Freq: Once | INTRAVENOUS | Status: AC

## 2021-06-04 MED ORDER — LACTATED RINGERS IV BOLUS
1000.0000 mL | Freq: Once | INTRAVENOUS | Status: AC
  Administered 2021-06-04: 1000 mL via INTRAVENOUS

## 2021-06-04 MED ORDER — CHLORHEXIDINE GLUCONATE CLOTH 2 % EX PADS
6.0000 | MEDICATED_PAD | Freq: Every day | CUTANEOUS | Status: DC
  Administered 2021-06-04 – 2021-06-08 (×4): 6 via TOPICAL

## 2021-06-04 MED ORDER — SODIUM CHLORIDE 0.9 % IV SOLN
250.0000 mg | Freq: Every day | INTRAVENOUS | Status: AC
  Administered 2021-06-04 – 2021-06-07 (×4): 250 mg via INTRAVENOUS
  Filled 2021-06-04 (×4): qty 20

## 2021-06-04 MED ORDER — PROCHLORPERAZINE EDISYLATE 10 MG/2ML IJ SOLN
10.0000 mg | Freq: Four times a day (QID) | INTRAMUSCULAR | Status: DC | PRN
  Administered 2021-06-04: 10 mg via INTRAVENOUS
  Filled 2021-06-04 (×2): qty 2

## 2021-06-04 MED ORDER — PREDNISONE 20 MG PO TABS
20.0000 mg | ORAL_TABLET | Freq: Every day | ORAL | Status: DC
Start: 2021-06-05 — End: 2021-06-04

## 2021-06-04 MED ORDER — IOHEXOL 350 MG/ML SOLN
100.0000 mL | Freq: Once | INTRAVENOUS | Status: AC | PRN
  Administered 2021-06-04: 100 mL via INTRAVENOUS

## 2021-06-04 NOTE — TOC Progression Note (Signed)
Transition of Care (TOC) - Initial/Assessment Note  ? ? ?Patient Details  ?Name: Raymond [name] ?MRN: [id number] ?Date of Birth: [birth year] ? ?Transition of Care (TOC) CM/SW Contact:    ?Paulene Floor Khyla Mccumbers, LCSWA ?Phone Number: ?06/04/2021, 3:35 PM ? ?Clinical Narrative:                 ?CSW received a call from [facility].  The facility is extending a bed offer to the patient and will begin insurance auth when he is closer to being medically ready. ? ? ?Expected Discharge Plan: [place] ?Barriers to Discharge: Continued Medical Work up ? ?Patient Goals and CMS Choice ?Patient states their goals for this hospitalization and ongoing recovery are:: SNF ?CMS Medicare.gov Compare Post Acute Care list provided to:: Patient ?Choice offered to / list presented to : Patient ? ?Expected Discharge Plan and Services ?Expected Discharge Plan: [place] ?In-house Referral: Clinical Social Work ?  ?  ?Living arrangements for the past 2 months: [place] ?                ?  ?  ?  ?  ?  ?  ?  ?  ?  ?  ? ?Prior Living Arrangements/Services ?Living arrangements for the past 2 months: [place] ?Lives with:: Self ?Patient language and need for interpreter reviewed:: Yes ?Do you feel safe going back to the place where you live?: No   SNF  ?Need for Family Participation in Patient Care: Yes (Comment) ?Care giver support system in place?: Yes (comment) ?  ?Criminal Activity/Legal Involvement Pertinent to Current Situation/Hospitalization: No - Comment as needed ? ?Activities of Daily Living ?  ?  ? ?Permission Sought/Granted ?Permission sought to share information with : Case Manager, Customer service manager, Family Supports ?Permission granted to share information with : Yes, Verbal Permission Granted ? Share Information with NAME: [name] ? Permission granted to share info w AGENCY: SNF ? Permission granted to share info w Relationship: Daughter ? Permission granted to share info  w Contact Information: [name] [phone] ? ?Emotional Assessment ?Appearance:: Appears stated age ?Attitude/Demeanor/Rapport: Gracious ?Affect (typically observed): Calm ?Orientation: : Oriented to Self, Oriented to Place, Oriented to  Time, Oriented to Situation ?Alcohol / Substance Use: Not Applicable ?Psych Involvement: No (comment) ? ?Admission diagnosis:  NSTEMI (non-ST elevated myocardial infarction) ([place]) [I21.4] ?Acute and chronic respiratory failure with hypoxia ([place]) [J96.21] ?Patient Active Problem List  ? Diagnosis Date Noted  ? CAP (community acquired pneumonia) 06/02/2021  ? Pressure injury of skin 06/01/2021  ? Protein-calorie malnutrition, severe 06/01/2021  ? Acute and chronic respiratory failure with hypoxia ([place]) 06/01/2021  ? NSTEMI (non-ST elevated myocardial infarction) ([place]) 05/31/2021  ? Dyslipidemia 05/31/2021  ? GERD without esophagitis 05/31/2021  ? 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 Depression 12/06/2020  ? Hyperlipidemia 12/06/2020  ? Tobacco use 12/06/2020  ? Peripheral vascular disease of lower extremity with ulceration ([place]) 12/06/2020  ? 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 Dry gangrene ([place]) 12/06/2020  ? Chronic respiratory failure ([place]) 12/06/2020  ? Gastric wall thickening 08/02/2020  ? Drug-induced constipation 08/02/2020  ? Moderate protein malnutrition ([place]) 07/21/2020  ? COPD with acute exacerbation ([place]) 06/22/2017  ? Hyperglycemia 06/22/2017  ? Anxiety 06/22/2017  ? Type 2 diabetes mellitus without complication ([place]) 12/87/8676  ? COPD (chronic obstructive pulmonary disease) ([place]) 06/22/2017  ? 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 CVA (cerebral infarction)  11/10/2013  ? Chest pain 04/05/2013  ? OA (osteoarthritis) of knee 02/24/2013  ? Chronic pain syndrome 02/24/2013  ? Spinal stenosis, lumbar region, with neurogenic claudication 02/24/2013  ? Gastritis 11/11/2012  ? Abnormal weight loss 11/11/2012  ?  Gastroesophageal reflux disease 10/15/2012  ? Abdominal pain, other specified site 10/15/2012  ? ?PCP:  [name], MD ?Pharmacy:   ?[facility] - [place], [place] - [address] ?[address] ?[place] [address] ?Phone: [phone] Fax: [phone] ? ? ? ? ?Social Determinants of Health (SDOH) Interventions ?  ? ?Readmission Risk Interventions ? ?  12/13/2020  ?  2:02 PM  ?Readmission Risk Prevention Plan  ?Transportation Screening Complete  ?PCP or Specialist Appt within 5-7 Days Complete  ?Home Care Screening Complete  ?Medication Review (RN CM) Complete  ? ? ? ?

## 2021-06-04 NOTE — Progress Notes (Signed)
? ?NAME:  Raymond [name], MRN:  [id number], DOB:  [birth year], LOS: 3 ?ADMISSION DATE:  05/31/2021, CONSULTATION DATE:  06/03/21 ?REFERRING MD:  [place] Crane, CHIEF COMPLAINT:  Hypotension with acute gib  ? ?History of Present Illness:  ?61 yo with pmh CAD s/p PCI, COPD, Chronic hypoxic resp failure, GERD, tobacco abuse, t2dm  presented originally on 05/31/21 with acute onset chest pain and aecopd. He underwent cardiac catheterization on 3/24 which revealed non obstructive cad. He was maintained on dapt and was planning for d/c however on 3/26 pt was noted to have black bm with trachycardia and some relative hypotension per chart review.  ? ?Gi was reportedly consulted this evening and had recommended stopping plavix. He was maintained on asa. Unfortunately pt cont to have decreasing hgb and worsening hemodynamics. Hgb down 2 gm and sbp 80's with tachycardia. TRH has reached back out to GI and started pt on protonix gtt, given ivf and held asa. They have consulted CCM for transfer to ICU at this time.  ? ?GI recs or plan pending at this time.  ? ?Pertinent  Medical History  ?As above ? ?Significant Hospital Events: ?Including procedures, antibiotic start and stop dates in addition to other pertinent events   ?3/24 cardiac catheterization ?3/26 acute gib, hypotensive, transferring to ICU ? ? ?Interim History / Subjective:  ?Patient is awake, alert and oriented. He reports a small amount of emesis overnight. Endorses epigastric pain and acid reflux.  ? ?Denies melena or hematochezia prior to this hospitalization. Denies NSAIDs use.  ? ?Objective   ?Blood pressure (!) 117/59, pulse 72, temperature 98.9 ?F (37.2 ?C), temperature source Oral, resp. rate 10, height '5\' 7"'$  (1.702 m), weight 63.3 kg, SpO2 92 %. ?   ?   ? ?Intake/Output Summary (Last 24 hours) at 06/04/2021 0829 ?Last data filed at 06/04/2021 0600 ?Gross per 24 hour  ?Intake 2609.9 ml  ?Output 3400 ml  ?Net -790.1 ml  ? ? ?Filed Weights  ? 05/31/21 2013 05/31/21 2039 06/04/21  0600  ?Weight: 59.4 kg 60.6 kg 63.3 kg  ? ? ?Examination: ?General: chronically ill male. Not in acute distress.  ?HENT: NCAT, eomi, poor dentition, mm dry and pale ?Lungs: mild wheezing ?Cardiovascular: RRR, no murmur ?Abdomen: soft, mildly ttp non distended, bs + ?Extremities: no c/c/e, muscle wasting in all 4 extremities, ?Neuro: no focal deficits, arousable and converses, follows commands ?GU: deferred ? ?Resolved Hospital Problem list   ? ? ?Assessment & Plan:  ?Acute GI bleed ?Hemorrhagic shock s/p 2 u RBC ?Iron deficiency anemia ?Likely upper GI bleed. Had an EGD in 2014 which showed gastric erosion. Biopsy sample negative for malignancy or H. Pylori.  ?Blood pressure stable at normal range.  ?-Pending GI evaluation  ?-NPO ?-H & H BID ?-maintain protonix gtt ?-Holding ASA and Plavix ?-Start Ferrlecit  ?If BP remains stable this pm, will transfer out ? ?NSTEMI ?Non obstructive CAD ?PVD ?No recent intervention. His chest pain could be demand ischemia 2/2 GI bleed.  ?-Holding ASA and plavix  ?-Continue Lipitor ? ?COPD exacerbation  ?Chronic hypoxic respiratory failure on 2L ?Mild wheezing heard. Continue Duonebs Q6H. ?-to complete 5 days doxy course ? ?Type 2 DM ?A1c 5.1  ?No indication for CBG monitoring ? ?Protein-calorie malnutrition, severe ?Continue supplement  ? ?Depression ?-Continue Welbutrin ? ?Tobacco use disorder  ?Continue nicotine patch ? ?Best Practice (right click and "Reselect all SmartList Selections" daily)  ? ?Diet/type: NPO ?DVT prophylaxis: other ?GI prophylaxis: PPI ?Lines: N/A ?Foley:  N/A ?Code  Status:  full code ?Last date of multidisciplinary goals of care discussion: will update family  ?Labs   ?CBC: ?Recent Labs  ?Lab 06/02/21 ?2778 06/03/21 ?2423 06/03/21 ?1938 06/03/21 ?2235 06/04/21 ?5361  ?WBC 16.8* 13.3* 13.5* 18.8* 13.3*  ?NEUTROABS  --   --  11.4* 15.3*  --   ?HGB 9.3* 10.7* 8.5* 8.0* 9.7*  ?HCT 32.2* 37.8* 29.8* 28.0* 30.8*  ?MCV 70.9* 71.9* 72.0* 72.0* 76.2*  ?PLT 346 348  422* 420* 290  ? ? ? ?Basic Metabolic Panel: ?Recent Labs  ?Lab 06/01/21 ?4431 06/02/21 ?5400 06/03/21 ?1938 06/03/21 ?2235 06/04/21 ?8676  ?NA 132* 132* 131* 132* 134*  ?K 4.3 4.1 4.4 4.5 5.4*  ?CL 96* 99 95* 98 103  ?CO2 '28 27 30 29 25  '$ ?GLUCOSE 139* 107* 137* 136* 120*  ?BUN 9 9 35* 42* 58*  ?CREATININE 0.59* 0.61 0.65 0.72 0.64  ?CALCIUM 7.6* 7.7* 7.5* 7.6* 7.3*  ?MG  --   --   --   --  1.8  ? ? ?GFR: ?Estimated Creatinine Clearance: 87.9 mL/min (by C-G formula based on SCr of 0.64 mg/dL). ?Recent Labs  ?Lab 06/03/21 ?1950 06/03/21 ?1938 06/03/21 ?2235 06/04/21 ?9326  ?WBC 13.3* 13.5* 18.8* 13.3*  ? ? ? ?Liver Function Tests: ?Recent Labs  ?Lab 06/03/21 ?2235  ?AST 7*  ?ALT 12  ?ALKPHOS 73  ?BILITOT 0.6  ?PROT 4.5*  ?ALBUMIN 1.7*  ? ?No results for input(s): LIPASE, AMYLASE in the last 168 hours. ?No results for input(s): AMMONIA in the last 168 hours. ? ?ABG ?   ?Component Value Date/Time  ? PHART 7.43 06/03/2021 2230  ? PCO2ART 46 06/03/2021 2230  ? PO2ART 71 (L) 06/03/2021 2230  ? HCO3 30.5 (H) 06/03/2021 2230  ? TCO2 29 12/08/2020 1312  ? ACIDBASEDEF 0.1 03/20/2007 0820  ? O2SAT 95.2 06/03/2021 2230  ? ?  ? ?Coagulation Profile: ?Recent Labs  ?Lab 06/03/21 ?2235  ?INR 1.2  ? ? ?Cardiac Enzymes: ?No results for input(s): CKTOTAL, CKMB, CKMBINDEX, TROPONINI in the last 168 hours. ? ?HbA1C: ?Hemoglobin-A1c  ?Date/Time Value Ref Range Status  ?09/25/2012 03:54 PM <0.40  Final  ? ?Hgb A1c MFr Bld  ?Date/Time Value Ref Range Status  ?05/31/2021 09:45 PM 5.1 4.8 - 5.6 % Final  ?  Comment:  ?  (NOTE) ?Pre diabetes:          5.7%-6.4% ? ?Diabetes:              >6.4% ? ?Glycemic control for   <7.0% ?adults with diabetes ?  ?12/06/2020 07:58 PM 5.5 4.8 - 5.6 % Final  ?  Comment:  ?  (NOTE) ?Pre diabetes:          5.7%-6.4% ? ?Diabetes:              >6.4% ? ?Glycemic control for   <7.0% ?adults with diabetes ?  ? ? ?CBG: ?Recent Labs  ?Lab 06/03/21 ?1204 06/03/21 ?1712 06/03/21 ?2003 06/03/21 ?2234 06/04/21 ?0247   ?GLUCAP 169* 140* 113* 150* 133*  ? ? ? ?Review of Systems:   ?Pain everywhere, from chest down to toes. Nausea, vomiting dark black contents. Black stool. Denies bright red blood. Denies previous events. No recent illnesses. R foot wound issue x2 months.  ? ?Past Medical History:  ?He,  has a past medical history of Anxiety, Back pain, Bipolar disorder ([place]), Borderline hypertension, Chronic pain, COPD (chronic obstructive pulmonary disease) ([place]), Depression with anxiety, GERD (gastroesophageal reflux disease), Headache, Hip pain, Hypercholesterolemia, Stroke (  [place]), and Tobacco use.  ? ?Surgical History:  ? ?Past Surgical History:  ?Procedure Laterality Date  ? AMPUTATION Right 12/08/2020  ? Procedure: RAY AMPUTATION OF RIGHT FIFTH TOE;  Surgeon: Angelia Mould, MD;  Location[facility] OR;  Service: Vascular;  Laterality: Right;  ? ANGIOPLASTY ILLIAC ARTERY Right 12/08/2020  ? Procedure: ILIAC ANGIOPLASTY WITH INSERTION OF RIGHT COMMON ILIAC 8MM X 79MM VIABAHN STENT;  Surgeon: Angelia Mould, MD;  Location: [place];  Service: Vascular;  Laterality: Right;  ? ANTERIOR FUSION CERVICAL SPINE    ? BACK SURGERY    ? CHOLECYSTECTOMY    ? COLONOSCOPY N/A 10/28/2012  ? YIF:OYDXAJ polyp-hyperplastic. Internal hemorrhoids. Distal 5 cm of terminal ileum appeared normal.  ? CORONARY ANGIOPLASTY WITH STENT PLACEMENT  1980s  ? ENDARTERECTOMY FEMORAL Right 12/08/2020  ? Procedure: RIGHT FEMORAL ENDARTERECTOMY WITH ILIOFEMORAL THROMBECTOMY;  Surgeon: Angelia Mould, MD;  Location: [facility] OR;  Service: Vascular;  Laterality: Right;  ? ESOPHAGOGASTRODUODENOSCOPY N/A 10/28/2012  ? OIN:OMVEHMCN gastric mucosa with mottling and submucosal petechiae. Mild chronic gastritis but no H. pylori home path. Small hiatal hernia. Duodenal lipoma  ? EYE SURGERY  Left eye  ? as a child  ? FEMORAL-POPLITEAL BYPASS GRAFT Right 12/08/2020  ? Procedure: RIGHT FEMORAL-BELOW KNEE POPLITEAL ARTERY BYPASS GRAFT;  Surgeon: Angelia Mould,  MD;  Location: [place];  Service: Vascular;  Laterality: Right;  ? LEFT HEART CATH AND CORONARY ANGIOGRAPHY N/A 06/01/2021  ? Procedure: LEFT HEART CATH AND CORONARY ANGIOGRAPHY;  Surgeon: Burnell Blanks,

## 2021-06-04 NOTE — Progress Notes (Signed)
eLink Physician-Brief Progress Note ?Patient Name: Raymond [name] ?DOB: November 27, 1960 ?MRN: [id number] ? ? ?Date of Service ? 06/04/2021  ?HPI/Events of Note ? N/V - In spite of Zofran.   ?eICU Interventions ? Plan: ?Add Compazine 10 mg IV Q 6 hours PRN N/V.  ? ? ? ?Intervention Category ?Major Interventions: Other: ? ?Blu Mcglaun Cornelia Copa ?06/04/2021, 2:39 AM ?

## 2021-06-04 NOTE — Consult Note (Addendum)
UNASSIGNED PATIENT ?Reason for Consult: Melena with anemia. ?Referring Physician: CCM. ? ?Raymond [name] is an 61 y.o. male.  ?HPI: Raymond [name] is a 61 year old white male with multiple medical problems listed below who is on home oxygen for chronic respiratory failure complicated by bipolar disorder, chronic pain,heavy tobacco abuse, peripheral vascular disease, CAD status post MI and PCI with stent placement, type 2 diabetes and osteoarthritis who was noted to have hypertension with some coffee-ground emesis and followed by melenic stools during this hospitalization for which she was transferred to the ICU.  He received 2 units of packed red blood cells and seems to have done well since then.  He initially presented to the ER with chest pain and palpitation was given and a GI cocktail and started on heparin with Nitropaste.  He denies have any GI complaints at the present time.  There is no history of abdominal pain nausea vomiting.  On reviewing the records in epic he had an EGD and colonoscopy on 10/28/2012 in [place] and was noted to have gastric erosions and a small hiatal hernia with lipoma in the duodenum and a small 7 mm polyp was removed from the anal verge on the colonoscopy and internal hemorrhoids were noted.  Due to his elevated troponins and chest pain he had a cardiac catheterization which showed nonobstructive CAD with EF of 55 to 60%.  ? ?Past Medical History:  ?Diagnosis Date  ? Anxiety   ? Back pain   ? Bipolar disorder ([place])   ? Borderline hypertension   ? Chronic pain   ? COPD (chronic obstructive pulmonary disease) ([place])   ? Depression with anxiety   ? GERD (gastroesophageal reflux disease)   ? Headache   ? Hip pain   ? Hypercholesterolemia   ? Stroke [facility])   ? Tobacco use   ? ?Past Surgical History:  ?Procedure Laterality Date  ? AMPUTATION Right 12/08/2020  ? Procedure: RAY AMPUTATION OF RIGHT FIFTH TOE;  Surgeon: Angelia Mould, MD;  Location: [facility] OR;  Service: Vascular;  Laterality:  Right;  ? ANGIOPLASTY ILLIAC ARTERY Right 12/08/2020  ? Procedure: ILIAC ANGIOPLASTY WITH INSERTION OF RIGHT COMMON ILIAC 8MM X 79MM VIABAHN STENT;  Surgeon: Angelia Mould, MD;  Location: [place];  Service: Vascular;  Laterality: Right;  ? ANTERIOR FUSION CERVICAL SPINE    ? BACK SURGERY    ? CHOLECYSTECTOMY    ? COLONOSCOPY N/A 10/28/2012  ? HMC:NOBSJG polyp-hyperplastic. Internal hemorrhoids. Distal 5 cm of terminal ileum appeared normal.  ? CORONARY ANGIOPLASTY WITH STENT PLACEMENT  1980s  ? ENDARTERECTOMY FEMORAL Right 12/08/2020  ? Procedure: RIGHT FEMORAL ENDARTERECTOMY WITH ILIOFEMORAL THROMBECTOMY;  Surgeon: Angelia Mould, MD;  Location: [facility] OR;  Service: Vascular;  Laterality: Right;  ? ESOPHAGOGASTRODUODENOSCOPY N/A 10/28/2012  ? GEZ:MOQHUTML gastric mucosa with mottling and submucosal petechiae. Mild chronic gastritis but no H. pylori home path. Small hiatal hernia. Duodenal lipoma  ? EYE SURGERY  Left eye  ? as a child  ? FEMORAL-POPLITEAL BYPASS GRAFT Right 12/08/2020  ? Procedure: RIGHT FEMORAL-BELOW KNEE POPLITEAL ARTERY BYPASS GRAFT;  Surgeon: Angelia Mould, MD;  Location: [place];  Service: Vascular;  Laterality: Right;  ? LEFT HEART CATH AND CORONARY ANGIOGRAPHY N/A 06/01/2021  ? Procedure: LEFT HEART CATH AND CORONARY ANGIOGRAPHY;  Surgeon: Burnell Blanks, MD;  Location: [place] [facility];  Service: Cardiovascular;  Laterality: N/A;  ? LUMBAR SPINE SURGERY    ? 3 x   ? ?Family History  ?Problem  Relation Age of Onset  ? Stomach cancer Mother   ? Throat cancer Father   ? Colon cancer Neg Hx   ? ?Social History:  reports that he has been smoking cigarettes. He has a 45.00 pack-year smoking history. He has never used smokeless tobacco. He reports that he does not drink alcohol and does not use drugs. ? ?Allergies:  ?Allergies  ?Allergen Reactions  ? Morphine And Related Shortness Of Breath and Palpitations  ? Penicillins Anaphylaxis  ? Vicodin [Hydrocodone-Acetaminophen]  Shortness Of Breath and Palpitations  ? Latex Hives and Rash  ? ?Medications: I have reviewed the patient's current medications. ?Prior to Admission:  ?Medications Prior to Admission  ?Medication Sig Dispense Refill Last Dose  ? albuterol (ACCUNEB) 0.63 MG/3ML nebulizer solution Take 1 ampule by nebulization every 6 (six) hours as needed for wheezing or shortness of breath.   05/30/2021  ? albuterol (PROVENTIL HFA;VENTOLIN HFA) 108 (90 BASE) MCG/ACT inhaler Inhale 2 puffs into the lungs every 6 (six) hours as needed for wheezing.   05/31/2021  ? allopurinol (ZYLOPRIM) 100 MG tablet Take 100 mg by mouth in the morning.   05/30/2021  ? aspirin 81 MG tablet Take 81 mg by mouth daily.   05/31/2021  ? buPROPion (WELLBUTRIN XL) 150 MG 24 hr tablet Take 1 tablet by mouth daily at 6 (six) AM.   05/30/2021  ? calcium carbonate (TUMS EX) 750 MG chewable tablet Chew 2 tablets by mouth as needed for heartburn.   Past Week  ? clopidogrel (PLAVIX) 75 MG tablet Take 1 tablet (75 mg total) by mouth daily at 6 (six) AM. 30 tablet 2 05/30/2021  ? gabapentin (NEURONTIN) 300 MG capsule Take 300 mg by mouth 3 (three) times daily.   05/30/2021  ? Magnesium Hydroxide (DULCOLAX SOFT CHEWS) 1200 MG CHEW Chew 1 tablet by mouth as needed (constipation).   Past Week  ? magnesium oxide (MAG-OX) 400 MG tablet Take 1 tablet by mouth daily.   05/30/2021  ? metFORMIN (GLUCOPHAGE) 500 MG tablet Take 2 tablets by mouth 2 (two) times daily.   05/30/2021  ? metoprolol tartrate (LOPRESSOR) 25 MG tablet TAKE 1/2 TABLET BY MOUTH TWICE DAILY Needs appointment for further refills (Patient taking differently: Take 12.5 mg by mouth 2 (two) times daily.) 15 tablet 0 05/30/2021 at 1200  ? omeprazole (PRILOSEC) 40 MG capsule Take 1 capsule by mouth daily at 6 (six) AM.   05/30/2021  ? ondansetron (ZOFRAN) 4 MG tablet Take 4 mg by mouth 3 (three) times daily as needed.   Past Week  ? rosuvastatin (CRESTOR) 10 MG tablet Take 2 tablets (20 mg total) by mouth at bedtime.    05/30/2021  ? tamsulosin (FLOMAX) 0.4 MG CAPS capsule Take 0.4 mg by mouth in the morning.   05/30/2021  ? [DISCONTINUED] oxyCODONE-acetaminophen (PERCOCET) 10-325 MG per tablet Take 1-2 tablets by mouth 4 (four) times daily as needed for pain. max FIVE PER DAY   05/30/2021  ? silver sulfADIAZINE (SILVADENE) 1 % cream Apply 1 application topically 2 (two) times daily. apply a 1/16 inch (1.5 mm) thick layer to entire burn area     ? ?Scheduled: ? allopurinol  100 mg Oral q AM  ? buPROPion  150 mg Oral Q0600  ? Chlorhexidine Gluconate Cloth  6 each Topical Daily  ? doxycycline  100 mg Oral Q12H  ? feeding supplement  237 mL Oral TID BM  ? gabapentin  100 mg Oral TID  ? guaiFENesin  600 mg  Oral BID  ? ipratropium-albuterol  3 mL Nebulization QID  ? magnesium oxide  400 mg Oral Daily  ? mouth rinse  15 mL Mouth Rinse BID  ? multivitamin with minerals  1 tablet Oral Daily  ? nicotine  21 mg Transdermal Daily  ? nutrition supplement (JUVEN)  1 packet Oral BID BM  ? [START ON 06/07/2021] pantoprazole  40 mg Intravenous Q12H  ? [START ON 06/05/2021] predniSONE  40 mg Oral Q breakfast  ? rosuvastatin  40 mg Oral Daily  ? saccharomyces boulardii  250 mg Oral BID  ? tamsulosin  0.4 mg Oral q AM  ? ?Continuous: ? ferric gluconate (FERRLECIT) IVPB Stopped (06/04/21 1235)  ? pantoprazole    ? pantoprazole 8 mg/hr (06/04/21 1300)  ? ? ?Results for orders placed or performed during the hospital encounter of 05/31/21 (from the past 48 hour(s))  ?Glucose, capillary     Status: Abnormal  ? Collection Time: 06/02/21  4:36 PM  ?Result Value Ref Range  ? Glucose-Capillary 107 (H) 70 - 99 mg/dL  ?  Comment: Glucose reference range applies only to samples taken after fasting for at least 8 hours.  ?Glucose, capillary     Status: Abnormal  ? Collection Time: 06/02/21  8:23 PM  ?Result Value Ref Range  ? Glucose-Capillary 110 (H) 70 - 99 mg/dL  ?  Comment: Glucose reference range applies only to samples taken after fasting for at least 8 hours.   ?Glucose, capillary     Status: Abnormal  ? Collection Time: 06/03/21 12:22 AM  ?Result Value Ref Range  ? Glucose-Capillary 148 (H) 70 - 99 mg/dL  ?  Comment: Glucose reference range applies only to samples ta

## 2021-06-04 NOTE — Progress Notes (Signed)
Physical Therapy Treatment ?Patient Details ?Name: Raymond [name] ?MRN: [id number] ?DOB: Jul 15, 1960 ?Today's Date: 06/04/2021 ? ? ?History of Present Illness 61 yo admitted 3/23 with chest pain. 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 ?  ?PT Comments  ? ? Pt dozing in bed on entry, able to wake up and states he is feeling very uncomfortable, agreeable to getting up with therapy. Pt reports he is feeling much better but also reports that he feels much weaker. Pt asks for additional person to help him to the chair but with encouragement pt agrees to assist in his movement and is able to come to the EoB with modA, requires maxA to scoot to EoB and then once feet are on the ground he is able to assist in lateral scooting from bed to drop arm recliner on his L. Once pt up in chair request to sit in very erect position to decrease back pain, however with pt's decreased core strength PT concern for falling forward, scooted hips back in chair and placed tray table in front of him for safety. Pt expresses gratitude to be up in recliner. D/c plans remain appropriate at this time. PT will continue to follow acutely.  ?  ?Recommendations for follow up therapy are one component of a multi-disciplinary discharge planning process, led by the attending physician.  Recommendations may be updated based on patient status, additional functional criteria and insurance authorization. ? ?Follow Up Recommendations ? Skilled nursing-short term rehab (<3 hours/day) ?  ?  ?Assistance Recommended at Discharge Frequent or constant Supervision/Assistance  ?Patient can return home with the following A lot of help with walking and/or transfers;A little help with bathing/dressing/bathroom;Assistance with cooking/housework;Assist for transportation;Help with stairs or ramp for entrance ?  ?Equipment Recommendations ? Wheelchair (measurements PT);Wheelchair cushion (measurements PT)  ?  ?    ?Precautions / Restrictions Precautions ?Precautions: Fall ?Precaution Comments: Watch O2, incontinent ?Restrictions ?Weight Bearing Restrictions: No  ?  ? ?Mobility ? Bed Mobility ?Overal bed mobility: Needs Assistance ?Bed Mobility: Supine to Sit ?  ?  ?Supine to sit: HOB elevated, Mod assist, Max assist ?  ?  ?General bed mobility comments: modA for managing LE off bed, bringing R UE across body, bringing trunk to upright, and then max A for pad scooting hips to EoB ?  ? ?Transfers ?Overall transfer level: Needs assistance ?  ?Transfers: Bed to chair/wheelchair/BSC ?  ?  ?  ?  ?  ? Lateral/Scoot Transfers: Mod assist ?General transfer comment: pt able to assist in lateral scooting from bed to chair, pt able to lean forward and off weight his hip, requires modA for lateral scoot with pad x5 to get to the chair ?  ? ?Ambulation/Gait ?  ?  ?  ?  ?  ?  ?  ?General Gait Details: unable ? ? ?  ? ? ?  ?Balance Overall balance assessment: Needs assistance ?  ?Sitting balance-Leahy Scale: Fair ?Sitting balance - Comments: due to rods in his back pt required increased upright sitting position and is able to hold him self up ?  ?  ?  ?Standing balance comment: LE weakness keeps pt from being able to come to standing ?  ?  ?  ?  ?  ?  ?  ?  ?  ?  ?  ?  ? ?  ?Cognition Arousal/Alertness: Awake/alert ?Behavior During Therapy: [facility] for tasks assessed/performed ?Overall Cognitive Status: Impaired/Different  from baseline ?Area of Impairment: Safety/judgement ?  ?  ?  ?  ?  ?  ?  ?  ?  ?  ?  ?  ?Safety/Judgement: Decreased awareness of safety, Decreased awareness of deficits ?  ?  ?General Comments: decrease awareness of safety, has difficulty keeping UE propped up and when they fall down he is not able to bring them back up ?  ?  ? ?  ?   ?General Comments  SpO2 on 4L O2 via [place] 88%O2 despite cues for purse lip breathing, increased 5L O2 and SpO2 increased to 91%O2, HR 80-90s, BP 113/57 ?  ?  ? ?Pertinent Vitals/Pain Pain  Assessment ?Pain Assessment: 0-10 ?Pain Score: 10-Worst pain ever ?Pain Location: lower back ?Pain Descriptors / Indicators: Guarding, Aching ?Pain Intervention(s): Monitored during session, Repositioned  ? ? ? ?PT Goals (current goals can now be found in the care plan section) Acute Rehab PT Goals ?Patient Stated Goal: be able to return to walking and home ?PT Goal Formulation: With patient ?Time For Goal Achievement: 06/15/21 ?Potential to Achieve Goals: Fair ?Progress towards PT goals: Progressing toward goals ? ?  ?Frequency ? ? ? Min 3X/week ? ? ? ?  ?PT Plan Current plan remains appropriate  ? ? ?   ?AM-PAC PT "6 Clicks" Mobility   ?Outcome Measure ? Help needed turning from your back to your side while in a flat bed without using bedrails?: A Little ?Help needed moving from lying on your back to sitting on the side of a flat bed without using bedrails?: A Lot ?Help needed moving to and from a bed to a chair (including a wheelchair)?: A Lot ?Help needed standing up from a chair using your arms (e.g., wheelchair or bedside chair)?: Total ?Help needed to walk in hospital room?: Total ?Help needed climbing 3-5 steps with a railing? : Total ?6 Click Score: 10 ? ?  ?End of Session Equipment Utilized During Treatment: Gait belt;Oxygen ?Activity Tolerance: Patient tolerated treatment well ?Patient left: in chair;with call bell/phone within reach;with chair alarm set ?Nurse Communication: Mobility status ?PT Visit Diagnosis: Other abnormalities of gait and mobility (R26.89);Difficulty in walking, not elsewhere classified (R26.2);Muscle weakness (generalized) (M62.81);Other symptoms and signs involving the nervous system (R29.898) ?  ? ? ?Time: 1132-1202 ?PT Time Calculation (min) (ACUTE ONLY): 30 min ? ?Charges:  $Therapeutic Activity: 23-37 mins          ?          ? ?Ammie Warrick B. Migdalia Dk PT, DPT ?Acute Rehabilitation Services ?Pager [phone] ?Office [phone] ? ? ? ?[place] ?06/04/2021,  12:30 PM ? ?

## 2021-06-04 NOTE — Progress Notes (Signed)
Received Cardiac Rehab order however pt without culprit for ?NSTE and cardiology signed off. Will not follow. ?Yves Dill CES, ACSM ?7:22 AM ?06/04/2021 ? ?

## 2021-06-04 NOTE — Progress Notes (Signed)
Transferred to [place]. Signed out to Dr. Karleen Hampshire with [facility]. They will pick up patient tomorrow.  ?

## 2021-06-05 ENCOUNTER — Encounter (HOSPITAL_COMMUNITY)
Admission: AD | Disposition: A | Discharge status: Skilled Nursing Facility | Payer: Self-pay | Source: Other Acute Inpatient Hospital | Attending: Internal Medicine

## 2021-06-05 DIAGNOSIS — E43 Unspecified severe protein-calorie malnutrition: Secondary | ICD-10-CM | POA: Diagnosis not present

## 2021-06-05 DIAGNOSIS — J9621 Acute and chronic respiratory failure with hypoxia: Secondary | ICD-10-CM | POA: Diagnosis not present

## 2021-06-05 DIAGNOSIS — J441 Chronic obstructive pulmonary disease with (acute) exacerbation: Secondary | ICD-10-CM | POA: Diagnosis not present

## 2021-06-05 DIAGNOSIS — K219 Gastro-esophageal reflux disease without esophagitis: Secondary | ICD-10-CM | POA: Diagnosis not present

## 2021-06-05 LAB — BASIC METABOLIC PANEL
Anion gap: 6 (ref 5–15)
BUN: 25 mg/dL — ABNORMAL HIGH (ref 6–20)
CO2: 26 mmol/L (ref 22–32)
Calcium: 8 mg/dL — ABNORMAL LOW (ref 8.9–10.3)
Chloride: 103 mmol/L (ref 98–111)
Creatinine, Ser: 0.57 mg/dL — ABNORMAL LOW (ref 0.61–1.24)
GFR, Estimated: >60 mL/min (ref >60)
Glucose, Bld: 92 mg/dL (ref 70–99)
Potassium: 3.7 mmol/L (ref 3.5–5.1)
Sodium: 135 mmol/L (ref 135–145)

## 2021-06-05 LAB — HEMOGLOBIN AND HEMATOCRIT, BLOOD
HCT: 32.7 % — ABNORMAL LOW (ref 39.0–52.0)
HCT: 32.8 % — ABNORMAL LOW (ref 39.0–52.0)
Hemoglobin: 10.1 g/dL — ABNORMAL LOW (ref 13.0–17.0)
Hemoglobin: 10.1 g/dL — ABNORMAL LOW (ref 13.0–17.0)

## 2021-06-05 LAB — MAGNESIUM: Magnesium: 1.7 mg/dL (ref 1.7–2.4)

## 2021-06-05 SURGERY — ESOPHAGOGASTRODUODENOSCOPY (EGD) WITH PROPOFOL
Anesthesia: Monitor Anesthesia Care

## 2021-06-05 MED ORDER — PANTOPRAZOLE SODIUM 40 MG IV SOLR
40.0000 mg | Freq: Two times a day (BID) | INTRAVENOUS | Status: DC
  Administered 2021-06-05 – 2021-06-13 (×18): 40 mg via INTRAVENOUS
  Filled 2021-06-05 (×18): qty 10

## 2021-06-05 MED ORDER — IPRATROPIUM-ALBUTEROL 0.5-2.5 (3) MG/3ML IN SOLN
3.0000 mL | Freq: Three times a day (TID) | RESPIRATORY_TRACT | Status: DC
  Administered 2021-06-06 – 2021-06-08 (×8): 3 mL via RESPIRATORY_TRACT
  Filled 2021-06-05 (×8): qty 3

## 2021-06-05 MED ORDER — SODIUM CHLORIDE 0.9 % IV SOLN: INTRAVENOUS | Status: DC

## 2021-06-05 MED ORDER — INSULIN ASPART 100 UNIT/ML IJ SOLN
0.0000 [IU] | Freq: Three times a day (TID) | INTRAMUSCULAR | Status: DC
  Administered 2021-06-06: 1 [IU] via SUBCUTANEOUS
  Administered 2021-06-07 – 2021-06-08 (×4): 2 [IU] via SUBCUTANEOUS
  Administered 2021-06-09 – 2021-06-10 (×2): 1 [IU] via SUBCUTANEOUS
  Administered 2021-06-10: 7 [IU] via SUBCUTANEOUS
  Administered 2021-06-12: 2 [IU] via SUBCUTANEOUS
  Administered 2021-06-13: 1 [IU] via SUBCUTANEOUS
  Administered 2021-06-13 – 2021-06-14 (×3): 3 [IU] via SUBCUTANEOUS
  Administered 2021-06-14 – 2021-06-15 (×2): 2 [IU] via SUBCUTANEOUS

## 2021-06-05 NOTE — Progress Notes (Signed)
? ? ? [facility] ?                                                                            ? ? ?Raymond [name], is a 61 y.o. male, DOB - [birth year], QIO:[id number] ?Admit date - 05/31/2021    ?Outpatient Primary MD for the patient is [name], MD ? ?LOS - 4  days ? ? ? ?Brief summary  ? ?Past medical history of CAD SP PCI, COPD, chronic respiratory failure, GERD, active smoker, PVD, type II DM.  Presents with complaints of chest pain and elevated troponin as well as COPD exacerbation. ?Cardiology was consulted.  Underwent cardiac catheterization on 3/24 which showed nonobstructive CAD.  Medical management recommended. Patient developed Malena, associated with hypotension and tachycardia.  ?GI consulted and he was started on PPI GTT and transferred to ICU for hypotension and possible pressors.  ?Pt did not require vasopressors and he was transferred to [facility] on 06/05/21.  ?Plan for EGD tomorrow.  ? ? ?Assessment & Plan  ? ? ?Assessment and Plan: ? ? ? ?GI bleed.  ?Likely upper GI bleed,GI consulted and he is scheduled for EGD in am.  ?NPO after midnight.  ?Continue with PPI BID,  ?Holding the aspirin and plavix .  ?Hemoglobin stable.  ? ? ? ?* Acute and chronic respiratory failure with hypoxia ([place]) ?COPD with acute exacerbation secondary to community-acquired pneumonia. ?Oxygen dependent.  ?Complete the course of doxycycline and prednisone.  [name]  ?Continue with bronchodilators .  ? ?NSTEMI (non-ST elevated myocardial infarction) ([place]) ?HLD. ?HTN. ?Presented with complaints of chest tightness. ?Troponins were minimally elevated patient was started on IV heparin drip.  Cardiology considers this as non-STEMI. ?Underwent cardiac catheterization which shows nonobstructive CAD. ?Echocardiogram performed, EF 55 to 60%. ?Continue with crestor, and anti platelet agents are on hold for GI bleed.  ?Cardiology currently signed off. ?Outpatient follow-up. ? ?Depression ?We will continue Wellbutrin XL. ? ?Spinal  stenosis, lumbar region, with neurogenic claudication ?Patient has chronic pain and is on Percocet.  We will continue. ?Also on chronic gabapentin which we will continue. ?Patient reports that he had a fall before his September presentation and has developed some numbness in bilateral upper and lower extremity and is supposed to see neurosurgery outpatient. ?CT scan performed in September 2022 after the fall does not reveal any evidence of fracture or acute abnormality in his spine. ?On chronic percocet, will continue. ? 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  Monitor. ? ?Protein-calorie malnutrition, severe ?Body mass index is 20.94 kg/m?[name] ?Nutrition Problem: Severe Malnutrition (in the context of chronic illness) ?Etiology: poor appetite ?Nutrition Interventions: ?Interventions: Refer to RD note for recommendations  ? ?Pressure injury of skin ?Present on admission. ?Left lower buttock. ?Wound care consulted. ?For right toe ulcer, cleanse right lateral foot wound with saline, pat dry. Place a small piece of Xeroform gauze over the wound, then dry gauze, secure with a few turns of kerlix. ?  ?For the left buttock/hip wound, cleanse with saline, place a small piece of xeroform gauze over the wound, and cover with a foam dressing. Change both every 2 days. ? ? ?Tobacco use ?Continue nicotine patch. ?  Patient was counseled on tobacco cessation. ? ? ?Leukocytosis:  ?Possibly from prednisone.  ? ?  ? ? ?RN Pressure Injury Documentation: ?Pressure Injury 05/31/21 Sacrum Medial Stage 1 -  Intact skin with non-blanchable redness of a localized area usually over a bony prominence. (Active)  ?05/31/21 2100  ?Location: Sacrum  ?Location Orientation: Medial  ?Staging: Stage 1 -  Intact skin with non-blanchable redness of a localized area usually over a bony prominence.  ?Wound Description (Comments):   ?Present on Admission: Yes  ?Dressing Type Foam - Lift dressing to  assess site every shift 06/05/21 0800  ?   ?Pressure Injury 05/31/21 Ischial tuberosity Left Stage 2 -  Partial thickness loss of dermis presenting as a shallow open injury with a red, pink wound bed without slough. (Active)  ?05/31/21 2300  ?Location: Ischial tuberosity  ?Location Orientation: Left  ?Staging: Stage 2 -  Partial thickness loss of dermis presenting as a shallow open injury with a red, pink wound bed without slough.  ?Wound Description (Comments):   ?Present on Admission: Yes  ?Dressing Type Foam - Lift dressing to assess site every shift 06/05/21 0800  ? ? ?Malnutrition Type: ? ?Nutrition Problem: Severe Malnutrition (in the context of chronic illness) ?Etiology: poor appetite ? ? ?Malnutrition Characteristics: ? ?Signs/Symptoms: severe fat depletion, severe muscle depletion, percent weight loss (20.6% x 11.5 months) ?Percent weight loss: 20.6 % ? ? ?Nutrition Interventions: ? ?Interventions: Refer to RD note for recommendations ? ?Estimated body mass index is 21.86 kg/m? as calculated from the following: ?  Height as of this encounter: '5\' 7"'$  (1.702 m). ?  Weight as of this encounter: 63.3 kg. ? ?Code Status: full code.  ?DVT Prophylaxis:   ? ? ?Level of Care: Level of care: Telemetry Medical ?Family Communication: none at bedside.  ? ?Disposition Plan:     Remains inpatient appropriate:  EGD scheduled for tomorrow.  ? ?Procedures:  ?EGD scheduled for tomorrow.  ? ?Consultants:   ? ?Gastroenterology ?PCCM.  ? ?Antimicrobials:  ? ?Anti-infectives (From admission, onward)  ? ? Start     Dose/Rate Route Frequency Ordered Stop  ? 06/03/21 0000  doxycycline (VIBRA-TABS) 100 MG tablet       ? 100 mg Oral 2 times daily 06/03/21 0800 06/06/21 2359  ? 06/01/21 2200  doxycycline (VIBRA-TABS) tablet 100 mg       ? 100 mg Oral Every 12 hours 06/01/21 1847 06/06/21 2159  ? ?  ? ? ? ?Medications ? ?Scheduled Meds: ? allopurinol  100 mg Oral q AM  ? buPROPion  150 mg Oral Q0600  ? Chlorhexidine Gluconate Cloth  6  each Topical Daily  ? doxycycline  100 mg Oral Q12H  ? feeding supplement  237 mL Oral TID BM  ? gabapentin  100 mg Oral TID  ? guaiFENesin  600 mg Oral BID  ? ipratropium-albuterol  3 mL Nebulization QID  ? magnesium oxide  400 mg Oral Daily  ? mouth rinse  15 mL Mouth Rinse BID  ? multivitamin with minerals  1 tablet Oral Daily  ? nicotine  21 mg Transdermal Daily  ? nutrition supplement (JUVEN)  1 packet Oral BID BM  ? pantoprazole  40 mg Intravenous Q12H  ? predniSONE  40 mg Oral Q breakfast  ? rosuvastatin  40 mg Oral Daily  ? saccharomyces boulardii  250 mg Oral BID  ? tamsulosin  0.4 mg Oral q AM  ? ?Continuous Infusions: ? ferric gluconate (FERRLECIT) IVPB 250 mg (06/05/21  0929)  ? ?PRN Meds:.acetaminophen, ALPRAZolam, alum & mag hydroxide-simeth, loperamide, nitroGLYCERIN, ondansetron (ZOFRAN) IV, [DISCONTINUED] oxyCODONE **AND** oxyCODONE-acetaminophen, prochlorperazine, traZODone ? ? ? ?Subjective:  ? ?Raymond [name] was seen and examined today.   Pt requesting to increase pain meds.  ? ?Objective:  ? ?Vitals:  ? 06/05/21 1100 06/05/21 1136 06/05/21 1200 06/05/21 1224  ?BP: 126/75  125/66   ?Pulse: 85  90 94  ?Resp: '11  11 14  '$ ?Temp:    (!) 97.5 ?F (36.4 ?C)  ?TempSrc:    Oral  ?SpO2: 99% 98% 100% 100%  ?Weight:      ?Height:      ? ? ?Intake/Output Summary (Last 24 hours) at 06/05/2021 1330 ?Last data filed at 06/05/2021 1314 ?Gross per 24 hour  ?Intake 1104.99 ml  ?Output 3300 ml  ?Net -2195.01 ml  ? ?Filed Weights  ? 05/31/21 2013 05/31/21 2039 06/04/21 0600  ?Weight: 59.4 kg 60.6 kg 63.3 kg  ? ? ? ?Exam ?General exam: Appears calm and comfortable  ?Respiratory system: Clear to auscultation. Respiratory effort normal. ?Cardiovascular system: S1 & S2 heard, RRR. No JVD, No pedal edema. ?Gastrointestinal system: Abdomen is nondistended, soft and nontender. Normal bowel sounds heard. ?Central nervous system: Alert and oriented. No focal neurological deficits. ?Extremities: Symmetric 5 x 5 power. ?Skin: No  rashes, lesions or ulcers ?Psychiatry: Mood & affect appropriate.  ? ? ?Data Reviewed:  I have personally reviewed following labs and imaging studies ? ? ?CBC ?Lab Results  ?Component Value Date  ? WBC 13.3 (H) 03/27

## 2021-06-05 NOTE — Progress Notes (Signed)
Subjective: ?No acute events.  No complaints. ? ?Objective: ?Vital signs in last 24 hours: ?Temp:  [98.3 ?F (36.8 ?C)-98.9 ?F (37.2 ?C)] 98.4 ?F (36.9 ?C) (03/28 0327) ?Pulse Rate:  [74-119] 80 (03/28 0500) ?Resp:  [7-23] 7 (03/28 0500) ?BP: (67-132)/(30-71) 95/56 (03/28 0500) ?SpO2:  [87 %-100 %] 100 % (03/28 0500) ?Last BM Date : 06/04/21 ? ?Intake/Output from previous day: ?03/27 0701 - 03/28 0700 ?In: 235 [P.O.:240; I.V.:427.9; IV Piggyback:327.1] ?Out: [place] [[id number]] ?Intake/Output this shift: ?No intake/output data recorded. ? ?General appearance: alert and no distress ?Resp: some rhonchi in both lung fields ?Cardio: regular rate and rhythm, S1, S2 normal, no murmur, click, rub or gallop ?GI: soft, non-tender; bowel sounds normal; no masses,  no organomegaly ?Extremities: extremities normal, atraumatic, no cyanosis or edema ? ?Lab Results: ?Recent Labs  ?  06/03/21 ?1938 06/03/21 ?2235 06/04/21 ?2706 06/04/21 ?1835  ?WBC 13.5* 18.8* 13.3*  --   ?HGB 8.5* 8.0* 9.7* 10.2*  ?HCT 29.8* 28.0* 30.8* 32.9*  ?PLT 422* 420* 290  --   ? ?BMET ?Recent Labs  ?  06/03/21 ?1938 06/03/21 ?2235 06/04/21 ?2376 06/04/21 ?1041  ?NA 131* 132* 134*  --   ?K 4.4 4.5 5.4* 5.1  ?CL 95* 98 103  --   ?CO2 '30 29 25  '$ --   ?GLUCOSE 137* 136* 120*  --   ?BUN 35* 42* 58*  --   ?CREATININE 0.65 0.72 0.64  --   ?CALCIUM 7.5* 7.6* 7.3*  --   ? ?LFT ?Recent Labs  ?  06/03/21 ?2235  ?PROT 4.5*  ?ALBUMIN 1.7*  ?AST 7*  ?ALT 12  ?ALKPHOS 73  ?BILITOT 0.6  ? ?PT/INR ?Recent Labs  ?  06/03/21 ?2235  ?LABPROT 15.0  ?INR 1.2  ? ?Hepatitis Panel ?No results for input(s): HEPBSAG, HCVAB, HEPAIGM, HEPBIGM in the last 72 hours. ?C-Diff ?No results for input(s): CDIFFTOX in the last 72 hours. ?Fecal Lactopherrin ?No results for input(s): FECLLACTOFRN in the last 72 hours. ? ?Studies/Results: ?DG CHEST PORT 1 VIEW ? ?Result Date: 06/03/2021 ?CLINICAL DATA:  Respiratory failure with hypoxia EXAM: PORTABLE CHEST 1 VIEW COMPARISON:  06/02/2021 FINDINGS:  Cardiac shadow is stable. Postsurgical changes in the cervical spine are seen. Right lung is clear. Persistent left basilar density is noted. No acute bony abnormality is seen. IMPRESSION: Stable left basilar atelectasis. Electronically Signed   By: Inez Catalina M.D.   On: 06/03/2021 22:59  ? ?DG Abd Portable 1V ? ?Result Date: 06/03/2021 ?CLINICAL DATA:  Nausea and vomiting. EXAM: PORTABLE ABDOMEN - 1 VIEW COMPARISON:  Jul 22, 2020 FINDINGS: The study is limited secondary to patient rotation. Gastric distension is suspected, however, this is limited in evaluation secondary to previously noted study limitations. The bowel gas pattern is otherwise normal. A large stool burden is seen. No radio-opaque calculi or other significant radiographic abnormality are seen. Postoperative changes seen within the lower lumbar spine. A total right hip replacement is seen without evidence of hardware complication. IMPRESSION: Suspected gastric distension, however, this is limited in evaluation secondary to previously noted study limitations. Correlation with follow-up plain film imaging versus abdomen pelvis CT is recommended if gastric outlet obstruction is of clinical concern. Electronically Signed   By: Virgina Norfolk M.D.   On: 06/03/2021 18:57  ? ?CT ANGIO GI BLEED ? ?Result Date: 06/04/2021 ?CLINICAL DATA:  Lower GI bleed. EXAM: CTA ABDOMEN AND PELVIS WITHOUT AND WITH CONTRAST TECHNIQUE: Multidetector CT imaging of the abdomen and pelvis was performed using the standard  protocol during bolus administration of intravenous contrast. Multiplanar reconstructed images and MIPs were obtained and reviewed to evaluate the vascular anatomy. RADIATION DOSE REDUCTION: This exam was performed according to the departmental dose-optimization program which includes automated exposure control, adjustment of the mA and/or kV according to patient size and/or use of iterative reconstruction technique. CONTRAST:  173m OMNIPAQUE IOHEXOL 350  MG/ML SOLN COMPARISON:  CTA aortobifemoral and runoff of 12/06/2020 FINDINGS: VASCULAR Aorta: There is moderate heterogeneous mixed plaque. Some of the soft plaque is ulcerative but no penetrating ulcer is seen or critical stenosis. No aneurysm. No dissection. Celiac: There are minimal nonstenosing calcifications, with scattered non stenosing calcific plaque in the splenic artery. Other branch arteries are clear. SMA: Normal caliber with minimal nonstenosing calcific plaques in the proximal vessel. Renals: Dual left and single right renal arteries left the more cephalad artery is dominant. There are scattered nonstenosing bilateral renal artery calcifications. Neither renal artery is significantly stenotic. IMA: Severe mixed plaque origin stenosis again noted. The remainder of the vessel opacifies well. Inflow: interval stenting of the previously occluded right common femoral artery, which is now adequately patent. Right internal iliac artery is calcified and occluded, there is moderate patchy mixed plaque in the left common iliac artery without stenosis. Left internal iliac artery demonstrated patchy calcification without significant stenosis. Right external iliac artery with moderate patchy mixed plaque and up to 50% stenosis mid segment, otherwise patent. Similar moderate disease in the left external iliac artery but no more than 40% stenosis mid segment. Proximal Outflow: Right common femoral artery is unremarkable. Left common femoral artery up to 50% stenotic due to mixed plaque. There are new postsurgical changes in the right groin with interval right fem-pop bypass graft, only the proximal end of which is seen and it is patent. Left superficial femoral artery is moderately diseased and at least 50% stenotic proximally, as before. Veins: The IVC, portal vein and pelvic deep veins are clear. Review of the MIP images confirms the above findings. NON-VASCULAR Lower chest: Interval development of a small right  and moderate-sized left pleural effusions, with adjacent consolidation or atelectasis in the left lower lobe. Small circumferential pericardial effusion noted with a cardiac size upper-normal. Pericardial fluid is also new. Hepatobiliary: 19 cm length liver with uniform enhancement. Gallbladder is absent without biliary dilatation. Pancreas: No focal abnormality. Spleen: No focal abnormality.  No splenomegaly. Adrenals/Urinary Tract: There is no adrenal or renal cortical masslike abnormality. There is asymmetric cortical contrast retention on the right with moderate hydroureteronephrosis, etiology unknown as the distal ureter is obscured by a right hip arthroplasty. This was not seen previously nor was any intrarenal stone identified on the prior study. The right lateral and posterior right bladder wall are also obscured. The bladder is normal in thickness where visible. There is no hydronephrosis or stone disease on the left. Stomach/Bowel: There is fold thickening of the stomach with stomach distended with fluid and food products. No GI tract arterial or venous contrast extravasation is seen. There are thickened folds in the left abdominal small bowel, fluid in the ascending colon. Normal caliber appendix is visible. There is moderate stool retention transverse and descending colon. There is no colonic thickening or inflammatory change. Lymphatic: No adenopathy is seen except for slightly prominent chronic retroperitoneal nodes in the gastrohepatic ligament and periportal spaces. Reproductive: Prominent prostate measuring 4.7 cm transverse. Other: Increased body wall anasarca and mesenteric congestive features, small amount of pelvic ascites and perihepatic ascites new in the interval. Musculoskeletal: Degenerative  and postsurgical changes lumbar spine with solid fusion L3-4 and intact posterior hardware, advanced disc collapse and endplate sclerosis A2-V6 with vacuum phenomenon and marginal osteophytes. Right hip  arthroplasty. IMPRESSION: VASCULAR 1. No significant stenosis in the SMA with again noted high-grade IMA origin stenosis, unchanged. 2. Aortoiliac atherosclerosis, interval right common iliac arterial stentin

## 2021-06-05 NOTE — Progress Notes (Signed)
Occupational Therapy Treatment ?Patient Details ?Name: Raymond [name] ?MRN: [id number] ?DOB: 16-Jun-1960 ?Today's Date: 06/05/2021 ? ? ?History of present illness 61 yo admitted 3/23 with chest pain. 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  ?OT comments ? Session focused on progression of dynamic sitting balance EOB, further assessment of ataxic movements (worsened from initial presentation s/p CVA per pt), and compensatory strategies for basic ADLs d/t coordination deficits. Pt requires Mod A for bed mobility but once EOB, able to accept mild dynamic challenges without LOB. Due to weakness and ataxia, pt requires significant assist for scooting along bedside. Due to difficulties with meals, provided plastic tumbler with lid/straw for pt to trial to maximize independence and encourage hydration. DC recs remain appropriate.   ? ?Recommendations for follow up therapy are one component of a multi-disciplinary discharge planning process, led by the attending physician.  Recommendations may be updated based on patient status, additional functional criteria and insurance authorization. ?   ?Follow Up Recommendations ? Skilled nursing-short term rehab (<3 hours/day)  ?  ?Assistance Recommended at Discharge Frequent or constant Supervision/Assistance  ?Patient can return home with the following ? A lot of help with walking and/or transfers;Two people to help with bathing/dressing/bathroom;Assistance with cooking/housework;Assistance with feeding;Direct supervision/assist for medications management;Direct supervision/assist for financial management;Assist for transportation;Help with stairs or ramp for entrance ?  ?Equipment Recommendations ? Other (comment) (TBD pending progress)  ?  ?Recommendations for Other Services   ? ?  ?Precautions / Restrictions Precautions ?Precautions: Fall ?Precaution Comments: Watch O2, incontinent, ataxic ?Restrictions ?Weight Bearing  Restrictions: No  ? ? ?  ? ?Mobility Bed Mobility ?Overal bed mobility: Needs Assistance ?Bed Mobility: Supine to Sit, Sit to Supine ?  ?  ?Supine to sit: Mod assist, HOB elevated ?Sit to supine: Mod assist ?  ?General bed mobility comments: able to assist in bring LEs off of bed, unable to lift trunk via handheld assist so increased assist needed to push shoulders upright. Mod A to get B LE back into bed ?  ? ?Transfers ?Overall transfer level: Needs assistance ?  ?  ?  ?  ?  ?  ?  ?  ?General transfer comment: attempted lateral scooting along bed (declined OOB to chair) with Max A needed ?  ?  ?Balance Overall balance assessment: Needs assistance ?Sitting-balance support: No upper extremity supported, Feet supported, Bilateral upper extremity supported ?Sitting balance-Leahy Scale: Fair ?Sitting balance - Comments: able to statically sit and accept mild dynamic balance challenges but limitations evident when reaching up or outside of BOS ?  ?  ?  ?  ?  ?  ?  ?  ?  ?  ?  ?  ?  ?  ?  ?   ? ?ADL either performed or assessed with clinical judgement  ? ?ADL Overall ADL's : Needs assistance/impaired ?Eating/Feeding: Maximal assistance;Sitting ?Eating/Feeding Details (indicate cue type and reason): able to reach for cup but once holding styrofoam cup, thumb went straight through the cup. reports difficulty eating; discussed finger foods instead though reported difficulty with a burger. provided plastic tumblr with lid and straw for pt to trial ?  ?  ?  ?  ?  ?  ?  ?  ?  ?  ?  ?  ?  ?  ?  ?  ?  ?General ADL Comments: focus on dynamic sitting balance, futher analysis of ataxia/coordination and feeding strategies ?  ? ?  Extremity/Trunk Assessment Upper Extremity Assessment ?Upper Extremity Assessment: RUE deficits/detail;LUE deficits/detail ?RUE Deficits / Details: pt demonstrating bil UE ataxia with over and under shooting with finger to nose, strength grossly 3/5 able to statically hold UE out without shakiness but  coordination deficits noted with voluntarily movements > involuntary movements. Attempted to hold styrofoam cup but thumb went straight through the cup. says fingers are numb ?RUE Sensation: decreased light touch;decreased proprioception ?RUE Coordination: decreased fine motor;decreased gross motor ?LUE Deficits / Details: pt demonstrating bil UE ataxia with over and under shooting with finger to nose, strength grossly 3/5, decreased grip on LUE ?LUE Sensation: decreased light touch;decreased proprioception ?LUE Coordination: decreased fine motor;decreased gross motor ?  ?Lower Extremity Assessment ?Lower Extremity Assessment: Defer to PT evaluation ?  ?  ?  ? ?Vision   ?Vision Assessment?: No apparent visual deficits ?  ?Perception   ?  ?Praxis   ?  ? ?Cognition Arousal/Alertness: Awake/alert ?Behavior During Therapy: [facility] for tasks assessed/performed ?Overall Cognitive Status: Impaired/Different from baseline ?Area of Impairment: Safety/judgement ?  ?  ?  ?  ?  ?  ?  ?  ?  ?  ?  ?  ?Safety/Judgement: Decreased awareness of safety, Decreased awareness of deficits ?  ?  ?General Comments: pleasant, improving insight into deficits though timeline difficult to follow with when ataxic movements occurred ?  ?  ?   ?Exercises   ? ?  ?Shoulder Instructions   ? ? ?  ?General Comments Spo2 on supplemental O2. RN present, discussing ataxic movements and positioing to minimize back pain. provided yellow theraband with further education needed  ? ? ?Pertinent Vitals/ Pain       Pain Assessment ?Pain Assessment: Faces ?Faces Pain Scale: Hurts little more ?Pain Location: lower back ?Pain Descriptors / Indicators: Guarding, Aching ?Pain Intervention(s): Monitored during session, Limited activity within patient's tolerance, Repositioned ? ?Home Living   ?  ?  ?  ?  ?  ?  ?  ?  ?  ?  ?  ?  ?  ?  ?  ?  ?  ?  ? ?  ?Prior Functioning/Environment    ?  ?  ?  ?   ? ?Frequency ? Min 2X/week  ? ? ? ? ?  ?Progress Toward Goals ? ?OT  Goals(current goals can now be found in the care plan section) ? Progress towards OT goals: Progressing toward goals ? ?Acute Rehab OT Goals ?Patient Stated Goal: be able to improve strength, feed self ?OT Goal Formulation: With patient ?Time For Goal Achievement: 06/15/21 ?Potential to Achieve Goals: Fair ?ADL Goals ?Pt Will Perform Lower Body Bathing: with min assist;sitting/lateral leans;sit to/from stand;with adaptive equipment ?Pt Will Perform Lower Body Dressing: with min assist;sitting/lateral leans;sit to/from stand;with adaptive equipment ?Pt Will Transfer to Toilet: with min assist;stand pivot transfer ?Pt/caregiver will Perform Home Exercise Program: Increased ROM;Increased strength;Both right and left upper extremity;With theraband;With minimal assist;With written HEP provided ?Additional ADL Goal #1: Patient will demonstrate increased activity tolerance by being able to sit EOB for 5-7 to complete functional ADL/IADL task.  ?Plan Discharge plan remains appropriate   ? ?Co-evaluation ? ? ?   ?  ?  ?  ?  ? ?  ?AM-PAC OT "6 Clicks" Daily Activity     ?Outcome Measure ? ? Help from another person eating meals?: A Lot ?Help from another person taking care of personal grooming?: A Little ?Help from another person toileting, which includes using toliet, bedpan, or  urinal?: A Lot ?Help from another person bathing (including washing, rinsing, drying)?: A Lot ?Help from another person to put on and taking off regular upper body clothing?: A Little ?Help from another person to put on and taking off regular lower body clothing?: Total ?6 Click Score: 13 ? ?  ?End of Session Equipment Utilized During Treatment: Oxygen ? ?OT Visit Diagnosis: Unsteadiness on feet (R26.81);Other abnormalities of gait and mobility (R26.89);Muscle weakness (generalized) (M62.81);Pain;Adult, failure to thrive (R62.7) ?  ?Activity Tolerance Patient tolerated treatment well ?  ?Patient Left in bed;with call bell/phone within reach;with  nursing/sitter in room ?  ?Nurse Communication Mobility status ?  ? ?   ? ?Time: 9937-1696 ?OT Time Calculation (min): 32 min ? ?Charges: OT General Charges ?$OT Visit: 1 Visit ?OT Treatments ?$Self Care/Home Management : 8-22 mi

## 2021-06-06 ENCOUNTER — Encounter (HOSPITAL_COMMUNITY): Payer: Self-pay | Admitting: Internal Medicine

## 2021-06-06 ENCOUNTER — Inpatient Hospital Stay (HOSPITAL_COMMUNITY): Payer: Medicare HMO | Admitting: Anesthesiology

## 2021-06-06 ENCOUNTER — Inpatient Hospital Stay (HOSPITAL_COMMUNITY): Payer: Medicare HMO

## 2021-06-06 ENCOUNTER — Other Ambulatory Visit: Payer: Self-pay

## 2021-06-06 ENCOUNTER — Encounter (HOSPITAL_COMMUNITY)
Admission: AD | Disposition: A | Discharge status: Skilled Nursing Facility | Payer: Self-pay | Source: Other Acute Inpatient Hospital | Attending: Internal Medicine

## 2021-06-06 DIAGNOSIS — I10 Essential (primary) hypertension: Secondary | ICD-10-CM

## 2021-06-06 DIAGNOSIS — J441 Chronic obstructive pulmonary disease with (acute) exacerbation: Secondary | ICD-10-CM | POA: Diagnosis not present

## 2021-06-06 DIAGNOSIS — J9621 Acute and chronic respiratory failure with hypoxia: Secondary | ICD-10-CM | POA: Diagnosis not present

## 2021-06-06 DIAGNOSIS — N1330 Unspecified hydronephrosis: Secondary | ICD-10-CM | POA: Diagnosis present

## 2021-06-06 DIAGNOSIS — D62 Acute posthemorrhagic anemia: Secondary | ICD-10-CM | POA: Diagnosis not present

## 2021-06-06 DIAGNOSIS — D509 Iron deficiency anemia, unspecified: Secondary | ICD-10-CM

## 2021-06-06 DIAGNOSIS — I2510 Atherosclerotic heart disease of native coronary artery without angina pectoris: Secondary | ICD-10-CM

## 2021-06-06 DIAGNOSIS — I214 Non-ST elevation (NSTEMI) myocardial infarction: Secondary | ICD-10-CM | POA: Diagnosis not present

## 2021-06-06 DIAGNOSIS — K254 Chronic or unspecified gastric ulcer with hemorrhage: Secondary | ICD-10-CM

## 2021-06-06 HISTORY — PX: BIOPSY: SHX5522

## 2021-06-06 HISTORY — PX: ESOPHAGOGASTRODUODENOSCOPY (EGD) WITH PROPOFOL: SHX5813

## 2021-06-06 LAB — GLUCOSE, CAPILLARY
Glucose-Capillary: 86 mg/dL (ref 70–99)
Glucose-Capillary: 102 mg/dL — ABNORMAL HIGH (ref 70–99)
Glucose-Capillary: 146 mg/dL — ABNORMAL HIGH (ref 70–99)
Glucose-Capillary: 241 mg/dL — ABNORMAL HIGH (ref 70–99)

## 2021-06-06 LAB — BASIC METABOLIC PANEL
Anion gap: 7 (ref 5–15)
BUN: 17 mg/dL (ref 6–20)
CO2: 28 mmol/L (ref 22–32)
Calcium: 8.3 mg/dL — ABNORMAL LOW (ref 8.9–10.3)
Chloride: 102 mmol/L (ref 98–111)
Creatinine, Ser: 0.53 mg/dL — ABNORMAL LOW (ref 0.61–1.24)
GFR, Estimated: >60 mL/min (ref >60)
Glucose, Bld: 79 mg/dL (ref 70–99)
Potassium: 3.4 mmol/L — ABNORMAL LOW (ref 3.5–5.1)
Sodium: 137 mmol/L (ref 135–145)

## 2021-06-06 LAB — HEMOGLOBIN AND HEMATOCRIT, BLOOD
HCT: 32.3 % — ABNORMAL LOW (ref 39.0–52.0)
HCT: 28.1 % — ABNORMAL LOW (ref 39.0–52.0)
Hemoglobin: 9.7 g/dL — ABNORMAL LOW (ref 13.0–17.0)
Hemoglobin: 8.5 g/dL — ABNORMAL LOW (ref 13.0–17.0)

## 2021-06-06 LAB — MAGNESIUM: Magnesium: 1.8 mg/dL (ref 1.7–2.4)

## 2021-06-06 SURGERY — ESOPHAGOGASTRODUODENOSCOPY (EGD) WITH PROPOFOL
Anesthesia: Monitor Anesthesia Care

## 2021-06-06 MED ORDER — ISOSORBIDE MONONITRATE ER 30 MG PO TB24
15.0000 mg | ORAL_TABLET | Freq: Every day | ORAL | Status: DC
  Administered 2021-06-06 – 2021-06-13 (×8): 15 mg via ORAL
  Filled 2021-06-06 (×8): qty 1

## 2021-06-06 MED ORDER — FLUCONAZOLE 200 MG PO TABS
400.0000 mg | ORAL_TABLET | Freq: Once | ORAL | Status: AC
  Administered 2021-06-06: 400 mg via ORAL
  Filled 2021-06-06: qty 2

## 2021-06-06 MED ORDER — FLUCONAZOLE 100 MG PO TABS
300.0000 mg | ORAL_TABLET | Freq: Every day | ORAL | Status: DC
  Administered 2021-06-07 – 2021-06-15 (×8): 300 mg via ORAL
  Filled 2021-06-06 (×3): qty 3
  Filled 2021-06-06 (×2): qty 2
  Filled 2021-06-06 (×2): qty 3
  Filled 2021-06-06: qty 2
  Filled 2021-06-06 (×2): qty 3
  Filled 2021-06-06 (×2): qty 2

## 2021-06-06 MED ORDER — METOCLOPRAMIDE HCL 5 MG/ML IJ SOLN
5.0000 mg | Freq: Four times a day (QID) | INTRAMUSCULAR | Status: DC
  Administered 2021-06-06 – 2021-06-14 (×29): 5 mg via INTRAVENOUS
  Filled 2021-06-06 (×28): qty 2

## 2021-06-06 MED ORDER — PHENYLEPHRINE 40 MCG/ML (10ML) SYRINGE FOR IV PUSH (FOR BLOOD PRESSURE SUPPORT)
PREFILLED_SYRINGE | INTRAVENOUS | Status: DC | PRN
  Administered 2021-06-06: 120 ug via INTRAVENOUS
  Administered 2021-06-06 (×3): 80 ug via INTRAVENOUS

## 2021-06-06 MED ORDER — SODIUM CHLORIDE 0.9 % IV SOLN: INTRAVENOUS | Status: DC

## 2021-06-06 MED ORDER — PROPOFOL 10 MG/ML IV BOLUS
INTRAVENOUS | Status: DC | PRN
  Administered 2021-06-06: 30 mg via INTRAVENOUS

## 2021-06-06 MED ORDER — PROPOFOL 500 MG/50ML IV EMUL
INTRAVENOUS | Status: DC | PRN
  Administered 2021-06-06: 150 ug/kg/min via INTRAVENOUS

## 2021-06-06 MED ORDER — LACTATED RINGERS IV SOLN: INTRAVENOUS | Status: DC | PRN

## 2021-06-06 MED ORDER — EPHEDRINE SULFATE-NACL 50-0.9 MG/10ML-% IV SOSY
PREFILLED_SYRINGE | INTRAVENOUS | Status: DC | PRN
  Administered 2021-06-06: 10 mg via INTRAVENOUS

## 2021-06-06 MED ORDER — LIDOCAINE 2% (20 MG/ML) 5 ML SYRINGE
INTRAMUSCULAR | Status: DC | PRN
  Administered 2021-06-06: 60 mg via INTRAVENOUS

## 2021-06-06 SURGICAL SUPPLY — 15 items

## 2021-06-06 NOTE — Progress Notes (Addendum)
? ? ? [facility] ?                                                                            ? ? ?Raymond [name], is a 61 y.o. male, DOB - [birth year], DQQ:[id number] ?Admit date - 05/31/2021    ?Outpatient Primary MD for the patient is [name], MD ? ?LOS - 5  days ? ? ? ?Brief summary  ? ?Past medical history of CAD SP PCI, COPD, chronic respiratory failure, GERD, active smoker, PVD, type II DM.  Presents with complaints of chest pain and elevated troponin as well as COPD exacerbation. ?Cardiology was consulted.  Underwent cardiac catheterization on 3/24 which showed nonobstructive CAD.  Medical management recommended. Patient developed Malena, associated with hypotension and tachycardia.  ?GI consulted and he was started on PPI GTT and transferred to ICU for hypotension and possible pressors.  ?Pt did not require vasopressors and he was transferred to [facility] on 06/05/21.  ?Patient underwent EGD on 3/29 that showed large cratered ulcer in distal third of stomach ? ? ?Assessment & Plan  ? ? ?Assessment and Plan: ? ? ? ?GI bleed.  ?Likely upper GI bleed, ?EGD on 3/29 showed large cratered ulcer in distal third of stomach ?Continue PPI ?Discussed with GI and recommended to continue to hold aspirin and plavix for now.  ?Continue to follow hemoglobin ? ?Acute blood loss anemia ?-Secondary to GI bleeding ?-Status post 2 units PRBC ?-Continue to follow hemoglobin ? ?Retained gastric contents ?-May have component of gastroparesis since he is on chronic opiates ?-We will give a trial of Reglan ? ?Esophageal candidiasis ?-Started on Diflucan ? ?* Acute and chronic respiratory failure with hypoxia ([place]) ?COPD with acute exacerbation secondary to community-acquired pneumonia. ?Uses oxygen as needed at home ?Complete the course of doxycycline and prednisone.  [name]  ?Continue with bronchodilators .  ? ?NSTEMI (non-ST elevated myocardial infarction) ([place]) ?HLD. ?HTN. ?Presented with complaints of chest tightness. ?Troponins  were minimally elevated patient was started on IV heparin drip.  Cardiology considers this as non-STEMI. ?Underwent cardiac catheterization which shows nonobstructive CAD. ?Echocardiogram performed, EF 55 to 60%. ?Continue with crestor, and anti platelet agents are on hold for GI bleed.  ?Cardiology currently signed off. ?Outpatient follow-up. ? ?Right hydronephrosis ?-Appears to be incidental finding on CT ?-Could not visualize distal ureter due to artifact from right hip arthroplasty ?-We will check renal ultrasound to further evaluate ?-Renal function currently normal ?-No signs of UTI or fever ?-Will likely discuss further with urology ? ?Depression ?We will continue Wellbutrin XL. ? ?Spinal stenosis, lumbar region, with neurogenic claudication ?Patient has chronic pain and is on Percocet.  We will continue. ?Also on chronic gabapentin which we will continue. ?Patient reports that he had a fall before his September presentation and has developed some numbness in bilateral upper and lower extremity and is supposed to see neurosurgery outpatient. ?CT scan performed in September 2022 after the fall does not reveal any evidence of fracture or acute abnormality in his spine. ?On chronic percocet, will continue. ? 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  Monitor. ? ?Protein-calorie malnutrition, severe ?Body mass index is 20.94 kg/m?[name] ?Nutrition  Problem: Severe Malnutrition (in the context of chronic illness) ?Etiology: poor appetite ?Nutrition Interventions: ?Interventions: Refer to RD note for recommendations  ? ?Pressure injury of skin ?Present on admission. ?Left lower buttock. ?Wound care consulted. ?For right toe ulcer, cleanse right lateral foot wound with saline, pat dry. Place a small piece of Xeroform gauze over the wound, then dry gauze, secure with a few turns of kerlix. ?  ?For the left buttock/hip wound, cleanse with saline, place a small piece  of xeroform gauze over the wound, and cover with a foam dressing. Change both every 2 days. ? ? ?Tobacco use ?Continue nicotine patch. ?Patient was counseled on tobacco cessation. ? ? ?Leukocytosis:  ?Possibly from prednisone.  ? ?  ? ? ?RN Pressure Injury Documentation: ?Pressure Injury 05/31/21 Sacrum Medial Stage 1 -  Intact skin with non-blanchable redness of a localized area usually over a bony prominence. (Active)  ?05/31/21 2100  ?Location: Sacrum  ?Location Orientation: Medial  ?Staging: Stage 1 -  Intact skin with non-blanchable redness of a localized area usually over a bony prominence.  ?Wound Description (Comments):   ?Present on Admission: Yes  ?Dressing Type Foam - Lift dressing to assess site every shift 06/06/21 0800  ?   ?Pressure Injury 05/31/21 Ischial tuberosity Left Stage 2 -  Partial thickness loss of dermis presenting as a shallow open injury with a red, pink wound bed without slough. (Active)  ?05/31/21 2300  ?Location: Ischial tuberosity  ?Location Orientation: Left  ?Staging: Stage 2 -  Partial thickness loss of dermis presenting as a shallow open injury with a red, pink wound bed without slough.  ?Wound Description (Comments):   ?Present on Admission: Yes  ?Dressing Type Foam - Lift dressing to assess site every shift 06/06/21 0800  ? ? ?Malnutrition Type: ? ?Nutrition Problem: Severe Malnutrition (in the context of chronic illness) ?Etiology: poor appetite ? ? ?Malnutrition Characteristics: ? ?Signs/Symptoms: severe fat depletion, severe muscle depletion, percent weight loss (20.6% x 11.5 months) ?Percent weight loss: 20.6 % ? ? ?Nutrition Interventions: ? ?Interventions: Refer to RD note for recommendations ? ?Estimated body mass index is 21.86 kg/m? as calculated from the following: ?  Height as of this encounter: '5\' 7"'$  (1.702 m). ?  Weight as of this encounter: 63.3 kg. ? ?Code Status: full code.  ?DVT Prophylaxis:  Place and maintain sequential compression device Start: 06/05/21  1735 ? ? ?Level of Care: Level of care: Telemetry Medical ?Family Communication: none at bedside.  ? ?Disposition Plan:     Remains inpatient appropriate:  Awaiting SNF placement ? ?Procedures:  ?3/29 EGD: candidal esophagitis, large non-obstructing, cratered ulcer occupying distal 1/3 of stomach in pre-pyloric region with patchy areas of necrosis. Large amount of solid debris in stomach ? ?Consultants:   ? ?Gastroenterology ?PCCM.  ? ?Antimicrobials:  ? ?Anti-infectives (From admission, onward)  ? ? Start     Dose/Rate Route Frequency Ordered Stop  ? 06/07/21 1000  fluconazole (DIFLUCAN) tablet 300 mg       ? 300 mg Oral Daily 06/06/21 1806 06/20/21 0959  ? 06/06/21 1900  fluconazole (DIFLUCAN) tablet 400 mg       ? 400 mg Oral  Once 06/06/21 1806    ? 06/03/21 0000  doxycycline (VIBRA-TABS) 100 MG tablet       ? 100 mg Oral 2 times daily 06/03/21 0800 06/06/21 2359  ? 06/01/21 2200  doxycycline (VIBRA-TABS) tablet 100 mg       ? 100 mg Oral Every 12  hours 06/01/21 1847 06/06/21 2159  ? ?  ? ? ? ?Medications ? ?Scheduled Meds: ? allopurinol  100 mg Oral q AM  ? buPROPion  150 mg Oral Q0600  ? Chlorhexidine Gluconate Cloth  6 each Topical Daily  ? doxycycline  100 mg Oral Q12H  ? feeding supplement  237 mL Oral TID BM  ? [START ON 06/07/2021] fluconazole  300 mg Oral Daily  ? fluconazole  400 mg Oral Once  ? gabapentin  100 mg Oral TID  ? guaiFENesin  600 mg Oral BID  ? insulin aspart  0-9 Units Subcutaneous TID WC  ? ipratropium-albuterol  3 mL Nebulization TID  ? isosorbide mononitrate  15 mg Oral Daily  ? magnesium oxide  400 mg Oral Daily  ? mouth rinse  15 mL Mouth Rinse BID  ? metoCLOPramide (REGLAN) injection  5 mg Intravenous Q6H  ? multivitamin with minerals  1 tablet Oral Daily  ? nicotine  21 mg Transdermal Daily  ? nutrition supplement (JUVEN)  1 packet Oral BID BM  ? pantoprazole  40 mg Intravenous Q12H  ? predniSONE  40 mg Oral Q breakfast  ? rosuvastatin  40 mg Oral Daily  ? saccharomyces boulardii   250 mg Oral BID  ? tamsulosin  0.4 mg Oral q AM  ? ?Continuous Infusions: ? ferric gluconate (FERRLECIT) IVPB 135 mL/hr at 06/06/21 1237  ? ?PRN Meds:.acetaminophen, ALPRAZolam, alum & mag hydroxide-simeth, l

## 2021-06-06 NOTE — Anesthesia Postprocedure Evaluation (Signed)
Anesthesia Post Note ? ?Patient: Raymond [name] ? ?Procedure(s) Performed: ESOPHAGOGASTRODUODENOSCOPY (EGD) WITH PROPOFOL ?BIOPSY ? ?  ? ?Patient location during evaluation: PACU ?Anesthesia Type: MAC ?Level of consciousness: awake and alert, oriented and patient cooperative ?Pain management: pain level controlled ?Vital Signs Assessment: post-procedure vital signs reviewed and stable ?Respiratory status: spontaneous breathing, nonlabored ventilation and respiratory function stable ?Cardiovascular status: stable and blood pressure returned to baseline ?Postop Assessment: no apparent nausea or vomiting ?Anesthetic complications: no ? ? ?No notable events documented. ? ?Last Vitals:  ?Vitals:  ? 06/06/21 1415 06/06/21 1438  ?BP: (!) 102/58 122/72  ?Pulse: 95 97  ?Resp: 12 15  ?Temp: (!) 36.4 ?C (!) 36.4 ?C  ?SpO2: 98% 100%  ?  ?Last Pain:  ?Vitals:  ? 06/06/21 1438  ?TempSrc: Oral  ?PainSc:   ? ? ?  ?  ?  ?  ?  ?  ? ?Anyra Kaufman,E. Emony Dormer ? ? ? ? ?

## 2021-06-06 NOTE — Progress Notes (Signed)
Patient states he is having 5/10 chest pain. He states it radiates to back and down both legs and feels like a squeezing pain. 1 SL nitro given and EKG obtained. Pain now 4/10. Gave patient his PRN percocet per his request. Message sent to Dr. Roderic Palau about above. ?

## 2021-06-06 NOTE — Anesthesia Preprocedure Evaluation (Addendum)
Anesthesia Evaluation  ?Patient identified by MRN, date of birth, ID band ?Patient awake ? ? ? ?Reviewed: ?Allergy & Precautions, NPO status , Patient's Chart, lab work & pertinent test results, reviewed documented beta blocker date and time  ? ?History of Anesthesia Complications ?Negative for: history of anesthetic complications ? ?Airway ?Mallampati: II ? ?TM Distance: >3 FB ?Neck ROM: Full ? ? ? Dental ? ?(+) Edentulous Lower, Edentulous Upper ?  ?Pulmonary ?COPD,  COPD inhaler, Current Smoker and Patient abstained from smoking.,  ?  ?breath sounds clear to auscultation ? ? ? ? ? ? Cardiovascular ?hypertension, Pt. on medications and Pt. on home beta blockers ?(-) angina+ CAD (06/01/2021 Cath: non-obstructing) and + Peripheral Vascular Disease  ? ?Rhythm:Regular Rate:Normal ? ?06/01/2021 ECHO: EF 55-60%. The left ventricle has normal function. The left ventricle has no regional wall motion abnormalities, normal RVF, no significant valvular abnormalities ?  ?Neuro/Psych ?Anxiety Depression Bipolar Disorder CVA, No Residual Symptoms   ? GI/Hepatic ?Neg liver ROS, GERD  Medicated and Poorly Controlled,  ?Endo/Other  ?diabetes (glu 102), Oral Hypoglycemic Agents ? Renal/GU ?negative Renal ROS  ? ?  ?Musculoskeletal ? ?(+) Arthritis ,  ? Abdominal ?  ?Peds ? Hematology ? ?(+) Blood dyscrasia (Hb 9.7), anemia ,   ?Anesthesia Other Findings ? ? Reproductive/Obstetrics ? ?  ? ? ? ? ? ? ? ? ? ? ? ? ? ?  ?  ? ? ? ? ? ? ? ?Anesthesia Physical ?Anesthesia Plan ? ?ASA: 3 ? ?Anesthesia Plan: MAC  ? ?Post-op Pain Management: Minimal or no pain anticipated  ? ?Induction:  ? ?PONV Risk Score and Plan: 0 and Treatment may vary due to age or medical condition ? ?Airway Management Planned: Natural Airway and Nasal Cannula ? ?Additional Equipment: None ? ?Intra-op Plan:  ? ?Post-operative Plan:  ? ?Informed Consent: I have reviewed the patients History and Physical, chart, labs and discussed the  procedure including the risks, benefits and alternatives for the proposed anesthesia with the patient or authorized representative who has indicated his/her understanding and acceptance.  ? ? ? ? ? ?Plan Discussed with: CRNA and Surgeon ? ?Anesthesia Plan Comments:   ? ? ? ? ? ?Anesthesia Quick Evaluation ? ?

## 2021-06-06 NOTE — Progress Notes (Signed)
PT Cancellation Note ? ?Patient Details ?Name: [name] ?MRN: [id number] ?DOB: 12-25-1960 ? ? ?Cancelled Treatment:    Reason Eval/Treat Not Completed: (P) Patient at procedure or test/unavailable (Current Location: [facility]) Will continue efforts per PT plan of care as schedule permits. ? ? ?Kara Pacer Merrianne Mccumbers ?06/06/2021, 1:42 PM ? ? ?

## 2021-06-06 NOTE — Care Management Important Message (Signed)
Important Message ? ?Patient Details  ?Name: Raymond [name] ?MRN: [id number] ?Date of Birth: 05/28/1960 ? ? ?Medicare Important Message Given:  Yes ? ? ? ? ?Shelda Altes ?06/06/2021, 11:07 AM ?

## 2021-06-06 NOTE — Transfer of Care (Signed)
Immediate Anesthesia Transfer of Care Note ? ?Patient: Raymond [name] ? ?Procedure(s) Performed: ESOPHAGOGASTRODUODENOSCOPY (EGD) WITH PROPOFOL ?BIOPSY ? ?Patient Location: PACU ? ?Anesthesia Type:MAC ? ?Level of Consciousness: awake and alert  ? ?Airway & Oxygen Therapy: Patient Spontanous Breathing and Patient connected to nasal cannula oxygen ? ?Post-op Assessment: Report given to RN and Post -op Vital signs reviewed and stable ? ?Post vital signs: Reviewed and stable ? ?Last Vitals:  ?Vitals Value Taken Time  ?BP 98/49 06/06/21 1402  ?Temp    ?Pulse 98 06/06/21 1402  ?Resp 17 06/06/21 1402  ?SpO2 97 % 06/06/21 1402  ?Vitals shown include unvalidated device data. ? ?Last Pain:  ?Vitals:  ? 06/06/21 1251  ?TempSrc: Temporal  ?PainSc:   ?   ? ?Patients Stated Pain Goal: 0 (06/06/21 0800) ? ?Complications: No notable events documented. ?

## 2021-06-06 NOTE — Op Note (Signed)
[facility] ?Patient Name: Raymond [name] ?Procedure Date : 06/06/2021 ?MRN: [id number] ?Attending MD: Juanita Craver , MD ?Date of Birth: 27-Mar-1960 ?CSN: [id number] ?Age: 61 ?Admit Type: Inpatient ?Procedure:                EGD with biopsies. ?Indications:              Iron deficiency anemia, Gastro-esophageal reflux  ?                          disease, Coffee-ground emesis, Melena ?Providers:                Juanita Craver, MD, Ladoris Gene, RN, Frazier Richards,  ?                          Technician ?Referring MD:             [name], MD ?Medicines:                Monitored Anesthesia Care ?Complications:            No immediate complications. ?Estimated Blood Loss:     Estimated blood loss was minimal. ?Procedure:                Pre-Anesthesia Assessment: - Prior to the  ?                          procedure, a history and physical was performed,  ?                          and patient medications and allergies were  ?                          reviewed. The patient's tolerance of previous  ?                          anesthesia was also reviewed. The risks and  ?                          benefits of the procedure and the sedation options  ?                          and risks were discussed with the patient. All  ?                          questions were answered, and informed consent was  ?                          obtained. Prior Anticoagulants: The patient has  ?                          taken no previous anticoagulant or antiplatelet  ?                          agents. ASA Grade Assessment: IV - A patient with  ?  severe systemic disease that is a constant threat  ?                          to life. After reviewing the risks and benefits,  ?                          the patient was deemed in satisfactory condition to  ?                          undergo the procedure. After obtaining informed  ?                          consent, the endoscope was passed under direct  ?                           vision. Throughout the procedure, the patient's  ?                          blood pressure, pulse, and oxygen saturations were  ?                          monitored continuously. The [id number]-[id number] ([id number])  ?                          Olympus endoscope was introduced through the mouth,  ?                          and advanced to the second part of duodenum. The  ?                          EGD was extremely difficult and visualizationw as  ?                          limited due to debris in the cardi, fundus and bosy  ?                          of the stomach. Completion of the procedure was  ?                          aided by lavage. The patient tolerated the  ?                          procedure well. ?Scope In: ?Scope Out: ?Findings: ?     There wer whitish exudates in the entire esophagus consistent with  ?     candidiasis; widely patent esophagus and GEJ; SCJ was measured at 40 cm. ?     A large, non-obstructing, cratered gastric ulcer with pigmented material  ?     was found extending over the distal 1/3 of the stomach with patchy areas  ?     of ?necrotic mucosa were also noted ?ischemia-biopsies done; the exam  ?     was limited due to large amount of solid debris were taken with a cold  ?     forceps for histology. ?  The examined duodenum was normal. ?Impression:               - Whitish exudates in the esophagus consistent with  ?                          esophageal candidiasis; otherwise widely patent  ?                          esophagus and GEJ. ?                          - Large non-obstructing, cratered ulcer occupying  ?                          the distal 1/3 of the stomach in the pre-pyloric  ?                          region with patchy necrotic areas ?ischemia-no  ?                          active bleeding noted-biopsies done. ?                          - Large amount of solid debris in the fundus,  ?                          cardia and body-complete visualization of the  ?                           gastric mucosa was not possible inspite of  ?                          aggressive lavage. ?                          - Normal examined duodenum. ?Moderate Sedation: ?     MAC used. ?Recommendation:           - Clear liquid diet today. ?                          - Treat with Diflucan for 10 days. ?                          - Continue present medications; avoid the use of  ?                          all NSAIDS.[name] ?                          - Await pathology results. ?                          - Repeat EGD in 4-6 weeks for re-evalaution of the  ?                          ulcer.[name] ?Procedure  Code(s):        --- Professional --- ?                          [phone], Esophagogastroduodenoscopy, flexible,  ?                          transoral; with biopsy, single or multiple ?Diagnosis Code(s):        --- Professional --- ?                          D50.9, Iron deficiency anemia, unspecified ?                          K92.1, Melena (includes Hematochezia) ?                          K92.0, Hematemesis ?                          K21.9, Gastro-esophageal reflux disease without  ?                          esophagitis ?                          K25.9, Gastric ulcer, unspecified as acute or  ?                          chronic, without hemorrhage or perforation ?CPT copyright 2019 American Medical Association. All rights reserved. ?The codes documented in this report are preliminary and upon coder review may  ?be revised to meet current compliance requirements. ?Juanita Craver, MD ?Juanita Craver, MD ?06/06/2021 2:17:07 PM ?This report has been signed electronically. ?Number of Addenda: 0 ?

## 2021-06-06 NOTE — TOC Progression Note (Signed)
Transition of Care (TOC) - Progression Note  ? ? ?Patient Details  ?Name: LINDWOOD MOGEL ?MRN: [id number] ?Date of Birth: [birth year] ? ?Transition of Care (TOC) CM/SW Contact  ?Milas Gain, LCSWA ?Phone Number: ?06/06/2021, 1:20 PM ? ?Clinical Narrative:    ? ?CSW spoke with Bryson Ha with [facility] who confirmed she will start insurance authorization for patient. CSW will continue to follow and assist with patients dc planning needs. ? ?Expected Discharge Plan: [place] ?Barriers to Discharge: Continued Medical Work up ? ?Expected Discharge Plan and Services ?Expected Discharge Plan: [place] ?In-house Referral: Clinical Social Work ?  ?  ?Living arrangements for the past 2 months: [place] ?                ?  ?  ?  ?  ?  ?  ?  ?  ?  ?  ? ? ?Social Determinants of Health (SDOH) Interventions ?  ? ?Readmission Risk Interventions ? ?  12/13/2020  ?  2:02 PM  ?Readmission Risk Prevention Plan  ?Transportation Screening Complete  ?PCP or Specialist Appt within 5-7 Days Complete  ?Home Care Screening Complete  ?Medication Review (RN CM) Complete  ? ? ?

## 2021-06-06 NOTE — Assessment & Plan Note (Addendum)
Baseline hemoglobin around 10. ?status post 2 units PRBC ?Secondary to GI bleeding ?Continue to follow hemoglobin ?

## 2021-06-07 ENCOUNTER — Ambulatory Visit: Payer: Medicare HMO | Admitting: Vascular Surgery

## 2021-06-07 ENCOUNTER — Inpatient Hospital Stay (HOSPITAL_COMMUNITY): Payer: Medicare HMO

## 2021-06-07 ENCOUNTER — Inpatient Hospital Stay (HOSPITAL_COMMUNITY)
Admission: RE | Admit: 2021-06-07 | Discharge: 2021-06-07 | Disposition: A | Discharge status: Home / Self Care | Payer: Medicare HMO | Source: Ambulatory Visit | Attending: Vascular Surgery | Admitting: Vascular Surgery

## 2021-06-07 ENCOUNTER — Encounter (HOSPITAL_COMMUNITY): Payer: Medicare HMO

## 2021-06-07 DIAGNOSIS — D62 Acute posthemorrhagic anemia: Secondary | ICD-10-CM | POA: Diagnosis not present

## 2021-06-07 DIAGNOSIS — J441 Chronic obstructive pulmonary disease with (acute) exacerbation: Secondary | ICD-10-CM | POA: Diagnosis not present

## 2021-06-07 DIAGNOSIS — J9621 Acute and chronic respiratory failure with hypoxia: Secondary | ICD-10-CM | POA: Diagnosis not present

## 2021-06-07 DIAGNOSIS — T8789 Other complications of amputation stump: Secondary | ICD-10-CM | POA: Diagnosis not present

## 2021-06-07 LAB — BPAM RBC
Blood Product Expiration Date: 202304042359
Blood Product Expiration Date: 202304042359
Blood Product Expiration Date: 202304052359
Blood Product Expiration Date: 202304052359
ISSUE DATE / TIME: 202303262236
ISSUE DATE / TIME: 202303270012
Unit Type and Rh: 6200
Unit Type and Rh: 6200
Unit Type and Rh: 6200
Unit Type and Rh: 6200

## 2021-06-07 LAB — CBC
HCT: 27 % — ABNORMAL LOW (ref 39.0–52.0)
Hemoglobin: 8.1 g/dL — ABNORMAL LOW (ref 13.0–17.0)
MCH: 23.7 pg — ABNORMAL LOW (ref 26.0–34.0)
MCHC: 30 g/dL (ref 30.0–36.0)
MCV: 78.9 fL — ABNORMAL LOW (ref 80.0–100.0)
Platelets: 324 10*3/uL (ref 150–400)
RBC: 3.42 MIL/uL — ABNORMAL LOW (ref 4.22–5.81)
RDW: 22.3 % — ABNORMAL HIGH (ref 11.5–15.5)
WBC: 16 10*3/uL — ABNORMAL HIGH (ref 4.0–10.5)
nRBC: 0 % (ref 0.0–0.2)

## 2021-06-07 LAB — MAGNESIUM: Magnesium: 1.8 mg/dL (ref 1.7–2.4)

## 2021-06-07 LAB — TYPE AND SCREEN
ABO/RH(D): A POS
Antibody Screen: NEGATIVE
Unit division: 0
Unit division: 0
Unit division: 0
Unit division: 0

## 2021-06-07 LAB — BASIC METABOLIC PANEL
Anion gap: 5 (ref 5–15)
BUN: 13 mg/dL (ref 6–20)
CO2: 30 mmol/L (ref 22–32)
Calcium: 8.1 mg/dL — ABNORMAL LOW (ref 8.9–10.3)
Chloride: 101 mmol/L (ref 98–111)
Creatinine, Ser: 0.56 mg/dL — ABNORMAL LOW (ref 0.61–1.24)
GFR, Estimated: >60 mL/min (ref >60)
Glucose, Bld: 85 mg/dL (ref 70–99)
Potassium: 3.3 mmol/L — ABNORMAL LOW (ref 3.5–5.1)
Sodium: 136 mmol/L (ref 135–145)

## 2021-06-07 LAB — GLUCOSE, CAPILLARY
Glucose-Capillary: 153 mg/dL — ABNORMAL HIGH (ref 70–99)
Glucose-Capillary: 182 mg/dL — ABNORMAL HIGH (ref 70–99)
Glucose-Capillary: 119 mg/dL — ABNORMAL HIGH (ref 70–99)
Glucose-Capillary: 181 mg/dL — ABNORMAL HIGH (ref 70–99)

## 2021-06-07 LAB — SURGICAL PATHOLOGY

## 2021-06-07 MED ORDER — GADOBUTROL 1 MMOL/ML IV SOLN
6.0000 mL | Freq: Once | INTRAVENOUS | Status: AC | PRN
  Administered 2021-06-07: 6 mL via INTRAVENOUS

## 2021-06-07 MED ORDER — PREDNISONE 20 MG PO TABS
20.0000 mg | ORAL_TABLET | Freq: Every day | ORAL | Status: AC
  Administered 2021-06-08 – 2021-06-09 (×2): 20 mg via ORAL
  Filled 2021-06-07 (×2): qty 1

## 2021-06-07 MED ORDER — INFLUENZA VAC SPLIT QUAD 0.5 ML IM SUSY
0.5000 mL | PREFILLED_SYRINGE | INTRAMUSCULAR | Status: DC
  Filled 2021-06-07: qty 0.5

## 2021-06-07 NOTE — Progress Notes (Signed)
Nutrition Follow-up ? ?DOCUMENTATION CODES:  ?Severe malnutrition in context of chronic illness ? ?INTERVENTION:  ?Adjust diet to GI soft per GI, encourage PO intake ?Continue Ensure Enlive po BID, each supplement provides 350 kcal and 20 grams of protein. ?Continue Juven BID, each packet provides 95 calories, 2.5 grams of protein (collagen) + micronutrients for wound healing ?Continue MVI with minerals daily ? ?NUTRITION DIAGNOSIS:  ?Severe Malnutrition (in the context of chronic illness) related to poor appetite as evidenced by severe fat depletion, severe muscle depletion, percent weight loss (20.6% x 11.5 months). ?- remains applicable  ? ?GOAL:  ?Patient will meet greater than or equal to 90% of their needs ?- progressing, supplements in place ? ?MONITOR:  ?PO intake, Supplement acceptance, Diet advancement ? ?REASON FOR ASSESSMENT:  ?Consult ?Assessment of nutrition requirement/status ? ?ASSESSMENT:  ?61 y.o. male with hx of COPD on 2L home O2, GERD, tobacco use, PVD, CAD, HLD, DM type 2, presented to ED with chest pain and SOB. Found to have NSTEMI ? ?3/24 - left heart cath and coronary angiography ?3/26 - acute GI bleed, transferred to ICU ?3/28 - transferred out of ICU ?3/29 - EGD, findings: esophageal candidiasis; Large non-obstructing, cratered ulcer occupying the distal 1/3 of the stomach in the prepyloric region with patchy necrotic areas. ? ?Pt resting in bedside chair at the time of assessment. Pt reports that he has been eating well at each meal. Documented intake at ~50% average. Pt reports this AM he had pancakes and eggs. Routinely drinking ensures. ? ?Pt inquired about healthy changes he could make to his diet upon discharge. Pt reports that he is going to quit smoking at discharge and wants to starting eating a more nutritious diet and taking his vitamins. Discussed the importance of eating from all food groups particularly with eating fruits and vegetables each day and limiting added sugar. Pt  agreeable. Has good family support with his children living on either side of him.  ? ?Average Meal Intake: ?3/25-3/30: 48% intake x 5 recorded meals ? ?Nutritionally Relevant Medications: ?Scheduled Meds: ? Ensure Enlive  237 mL Oral TID BM  ? fluconazole  300 mg Oral Daily  ? insulin aspart  0-9 Units Subcutaneous TID WC  ? magnesium oxide  400 mg Oral Daily  ? REGLAN  5 mg Intravenous Q6H  ? multivitamin with minerals  1 tablet Oral Daily  ? JUVEN  1 packet Oral BID BM  ? pantoprazole  40 mg Intravenous Q12H  ? predniSONE  40 mg Oral Q breakfast  ? rosuvastatin  40 mg Oral Daily  ? saccharomyces boulardii  250 mg Oral BID  ? ?Continuous Infusions: ? ferric gluconate (FERRLECIT) IVPB 250 mg (06/07/21 0909)  ? ?PRN Meds: alum & mag hydroxide-simeth, ondansetron  ? ?Labs Reviewed: ?Potassium: 3.3 ?Creatinine .56 ?CBG ranges from 86-241 mg/dL over the last 24 hours ?HgbA1c 5.1% (3/23)  ? ?NUTRITION - FOCUSED PHYSICAL EXAM: ?Flowsheet Row Most Recent Value  ?Orbital Region Severe depletion  ?Upper Arm Region Severe depletion  ?Thoracic and Lumbar Region Moderate depletion  ?Buccal Region Severe depletion  ?Temple Region Moderate depletion  ?Clavicle Bone Region Severe depletion  ?Clavicle and Acromion Bone Region Severe depletion  ?Scapular Bone Region Severe depletion  ?Dorsal Hand Moderate depletion  ?Patellar Region Moderate depletion  ?Anterior Thigh Region Moderate depletion  ?Posterior Calf Region Severe depletion  ?Edema (RD Assessment) None  ?Hair Reviewed  ?Eyes Reviewed  ?Mouth Reviewed  ?Skin Reviewed  [scattered bruising]  ?Nails Reviewed  ? ?  Diet Order:   ?Diet Order   ? ?       ?  DIET SOFT Room service appropriate? Yes; Fluid consistency: Thin  Diet effective now       ?  ?  Diet - low sodium heart healthy       ?  ? ?  ?  ? ?  ? ? ?EDUCATION NEEDS:  ?Education needs have been addressed ? ?Skin:  Skin Assessment: Reviewed RN Assessment (left Ischial tuberosity stage 2, non-healing surgical incision  right foot, stage 1 to the sacrum) ? ?Last BM:  3/28 - type 7 ? ?Height:  ?Ht Readings from Last 1 Encounters:  ?05/31/21 '5\' 7"'$  (1.702 m)  ? ? ?Weight:  ?Wt Readings from Last 1 Encounters:  ?06/04/21 63.3 kg  ? ? ?Ideal Body Weight:  67.3 kg ? ?BMI:  Body mass index is 21.86 kg/m?. ? ?Estimated Nutritional Needs:  ?Kcal:  1800-2000 kcal/d ?Protein:  90-100 g/d ?Fluid:  >/=2 L/d ? ? ?Ranell Patrick, RD, LDN ?Clinical Dietitian ?RD pager # available in [place]  ?After hours/weekend pager # available in [place] ?

## 2021-06-07 NOTE — Progress Notes (Signed)
? ? ? [facility] ?                                                                            ? ? ?Raymond [name], is a 61 y.o. male, DOB - August 29, 1960, ONG:[id number] ?Admit date - 05/31/2021    ?Outpatient Primary MD for the patient is [name], MD ? ?LOS - 6  days ? ? ? ?Brief summary  ? ?Past medical history of CAD SP PCI, COPD, chronic respiratory failure, GERD, active smoker, PVD, type II DM.  Presents with complaints of chest pain and elevated troponin as well as COPD exacerbation. ?Cardiology was consulted.  Underwent cardiac catheterization on 3/24 which showed nonobstructive CAD.  Medical management recommended. Patient developed Malena, associated with hypotension and tachycardia.  ?GI consulted and he was started on PPI GTT and transferred to ICU for hypotension and possible pressors.  ?Pt did not require vasopressors and he was transferred to [facility] on 06/05/21.  ?Patient underwent EGD on 3/29 that showed large cratered ulcer in distal third of stomach ? ? ?Assessment & Plan  ? ? ?Assessment and Plan: ? ? ? ?GI bleed.  ?Likely upper GI bleed, ?EGD on 3/29 showed large cratered ulcer in distal third of stomach ?Continue PPI ?Discussed with GI and recommended to continue to hold aspirin and plavix for another 7 days (until 06/14/2021) ?Continue to follow hemoglobin ? ?Acute blood loss anemia ?-Secondary to GI bleeding ?-Status post 2 units PRBC ?-Continue to follow hemoglobin ? ?Retained gastric contents ?-May have component of gastroparesis since he is on chronic opiates ?-Appears to be tolerating Reglan ? ?Esophageal candidiasis ?-Started on Diflucan ? 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 ?* Acute and chronic respiratory failure with hypoxia ([place]) ?COPD with acute exacerbation secondary to community-acquired pneumonia. ?Uses oxygen as needed at home ?Complete the course of doxycycline and prednisone taper ?Continue with bronchodilators .  ? ?NSTEMI (non-ST elevated myocardial infarction) ([place]) ?HLD. ?HTN. ?Presented with complaints of chest tightness. ?Troponins were minimally elevated patient was started on IV heparin drip.  Cardiology considers this as non-STEMI. ?Underwent cardiac catheterization which shows nonobstructive CAD. ?Echocardiogram performed, EF 55 to 60%. ?Continue with crestor, and anti platelet agents are on hold for GI bleed.  ?Cardiology currently signed off. ?Outpatient follow-up. ? 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 Abner Greenspan who felt it would be reasonable to have patient follow-up with urology in the next couple weeks with repeat CT abdomen with renal stone protocol ? ?Depression ?We will continue Wellbutrin XL. ? ?Chronic  degenerative disc disease ?Reports having surgery on the lumbar spine as well as cervical spine in the past (between 10 to 20 years ago) ?Imaging indicates ACDF as well as lumbar fusion in the past ?His neurosurgeon, Dr. Carloyn Manner is now retired ? ? 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  Monitor. ? ?Protein-calorie malnutrition, severe ?Body mass index is 20.94 kg/m?[name] ?Nutrition Problem: Severe Malnutrition (in the context of chronic  illness) ?Etiology: poor appetite ?Nutrition Interventions: ?Interventions: Refer to RD note for recommendations  ? ?Pressure injury of skin ?Present on admission. ?Left lower buttock. ?Wound care consulted. ?For right toe ulcer, cleanse right lateral foot wound with saline, pat dry. Place a small piece of Xeroform gauze over the wound, then dry gauze, secure with a few turns of kerlix. ?  ?For the left buttock/hip wound, cleanse with saline, place a small piece of xeroform gauze over the wound, and cover with a foam dressing. Change both every 2 days. ? ? ?Tobacco use ?Continue nicotine patch. ?Patient was counseled on tobacco cessation. ? ? ?Leukocytosis:  ?Possibly from prednisone.  ? ?  ? ? ?RN Pressure Injury Documentation: ?Pressure Injury 05/31/21 Sacrum Medial Stage 1 -  Intact skin with non-blanchable redness of a localized area usually over a bony prominence. (Active)  ?05/31/21 2100  ?Location: Sacrum  ?Location Orientation: Medial  ?Staging: Stage 1 -  Intact skin with non-blanchable redness of a localized area usually over a bony prominence.  ?Wound Description (Comments):   ?Present on Admission: Yes  ?Dressing Type Foam - Lift dressing to assess site every shift 06/06/21 0800  ?   ?Pressure Injury 05/31/21 Ischial tuberosity Left Stage 2 -  Partial thickness loss of dermis presenting as a shallow open injury with a red, pink wound bed without slough. (Active)  ?05/31/21 2300  ?Location: Ischial tuberosity  ?Location Orientation: Left  ?Staging: Stage 2 -  Partial thickness loss of dermis presenting as a shallow open injury with a red, pink wound bed without slough.  ?Wound Description (Comments):   ?Present on Admission: Yes  ?Dressing Type Foam - Lift dressing to assess site every shift 06/06/21 2100  ? ? ?Malnutrition Type: ? ?Nutrition Problem: Severe Malnutrition (in the context of chronic illness) ?Etiology: poor appetite ? ? ?Malnutrition Characteristics: ? ?Signs/Symptoms: severe fat depletion,  severe muscle depletion, percent weight loss (20.6% x 11.5 months) ?Percent weight loss: 20.6 % ? ? ?Nutrition Interventions: ? ?Interventions: Refer to RD note for recommendations ? ?Estimated body mass index is 21.86 kg/m? as calculated from the following: ?  Height as of this encounter: '5\' 7"'$  (1.702 m). ?  Weight as of this encounter: 63.3 kg. ? ?Code Status: full code.  ?DVT Prophylaxis:  Place and maintain sequential compression device Start: 06/05/21 1735 ? ? ?Level of Care: Level of care: Telemetry Medical ?Family Communication: none at bedside.  ? ?Disposition Plan:     Remains inpatient appropriate:  Awaiting SNF placement ? ?Procedures:  ?3/29 EGD: candidal esophagitis, large non-obstructing, cratered ulcer occupying distal 1/3 of stomach in pre-pyloric region with patchy areas of necrosis. Large amount of solid debris in stomach ? ?Consultants:   ? ?Gastroenterology ?PCCM.  ? ?Antimicrobials:  ? ?Anti-infectives (From admission, onward)  ? ? Start     Dose/Rate Route Frequency Ordered Stop  ? 06/07/21 1000  fluconazole (DIFLUCAN) tablet 300 mg       ? 300 mg Oral Daily 06/06/21 1806 06/20/21 0959  ? 06/06/21 1900  fluconazole (DIFLUCAN) tablet 400 mg       ? 400 mg Oral  Once 06/06/21 1806 06/06/21 2134  ? 06/03/21 0000  doxycycline (VIBRA-TABS) 100 MG tablet       ? 100 mg Oral 2 times daily 06/03/21 0800 06/06/21 2359  ? 06/01/21 2200  doxycycline (VIBRA-TABS) tablet 100 mg       ? 100 mg Oral Every 12 hours 06/01/21 1847 06/06/21 2159  ? ?  ? ? ? ?Medications ? ?Scheduled Meds: ? allopurinol  100 mg Oral q AM  ? buPROPion  150 mg Oral Q0600  ? Chlorhexidine Gluconate Cloth  6 each Topical Daily  ? feeding supplement  237 mL Oral TID BM  ? fluconazole  300 mg Oral Daily  ? gabapentin  100 mg Oral TID  ? guaiFENesin  600 mg Oral BID  ? [START ON 06/08/2021] influenza vac split quadrivalent PF  0.5 mL Intramuscular Tomorrow-1000  ? insulin aspart  0-9 Units Subcutaneous TID WC  ? ipratropium-albuterol  3  mL Nebulization TID  ? isosorbide mononitrate  15 mg Oral Daily  ? magnesium oxide  400 mg Oral Daily  ? mouth rinse  15 mL Mouth Rinse BID  ? metoCLOPramide (REGLAN) injection  5 mg Intravenous Q6H  ?

## 2021-06-07 NOTE — Progress Notes (Signed)
Physical Therapy Treatment ?Patient Details ?Name: Raymond [name] ?MRN: [id number] ?DOB: 1960/03/19 ?Today's Date: 06/07/2021 ? ? ?History of Present Illness 61 yo admitted 3/23 with chest pain. 3/24 pt s/p cardiac cath with coffee ground emesis and respiratory failure. 3/29 EGD. 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 ?  ?PT Comments  ? ? Pt pleasant and eager to get OOB with increased ability to repeat standing trials with bil knees blocked. Pt remains with bil UE ataxia making his ability to use Upper body to power up or control balance not feasible. Pt able to stand max 30 sec prior to fatigue and educated for transfers and bil LE HEP ?   ?Recommendations for follow up therapy are one component of a multi-disciplinary discharge planning process, led by the attending physician.  Recommendations may be updated based on patient status, additional functional criteria and insurance authorization. ? ?Follow Up Recommendations ? Skilled nursing-short term rehab (<3 hours/day) ?  ?  ?Assistance Recommended at Discharge Frequent or constant Supervision/Assistance  ?Patient can return home with the following A lot of help with walking and/or transfers;A little help with bathing/dressing/bathroom;Assistance with cooking/housework;Assist for transportation;Help with stairs or ramp for entrance ?  ?Equipment Recommendations ? Wheelchair (measurements PT);Wheelchair cushion (measurements PT)  ?  ?Recommendations for Other Services   ? ? ?  ?Precautions / Restrictions Precautions ?Precautions: Fall ?Precaution Comments: Watch O2, incontinent, ataxia bil UE and RLE  ?  ? ?Mobility ? Bed Mobility ?Overal bed mobility: Needs Assistance ?Bed Mobility: Supine to Sit ?  ?  ?Supine to sit: Min assist, HOB elevated ?  ?  ?General bed mobility comments: HOB 30 degrees with min assist, rail and increased time to transition to sitting EOB ?  ? ?Transfers ?Overall transfer level: Needs  assistance ?  ?Transfers: Sit to/from Stand, Bed to chair/wheelchair/BSC ?Sit to Stand: +2 physical assistance, Mod assist ?Stand pivot transfers: Mod assist ?  ?  ?  ?  ?General transfer comment: pt able to stand x 5 with min-mod assist with +2 assist for safety with bil knees blocked. Ataxia in bil UE prevents pt from being able to utilize upper body strength to stand or stabilize with transfers. Stood pivot from bed to recliner ?  ? ?Ambulation/Gait ?  ?  ?  ?  ?  ?  ?  ?General Gait Details: unable ? ? ?Stairs ?  ?  ?  ?  ?  ? ? ?Wheelchair Mobility ?  ? ?Modified Rankin (Stroke Patients Only) ?  ? ? ?  ?Balance Overall balance assessment: Needs assistance ?Sitting-balance support: No upper extremity supported, Feet supported ?Sitting balance-Leahy Scale: Fair ?Sitting balance - Comments: static sitting with guarding for safety ?  ?Standing balance support: No upper extremity supported ?Standing balance-Leahy Scale: Poor ?Standing balance comment: bil knees blocked and physical assist with belt to maintain standing ?  ?  ?  ?  ?  ?  ?  ?  ?  ?  ?  ?  ? ?  ?Cognition Arousal/Alertness: Awake/alert ?Behavior During Therapy: [facility] for tasks assessed/performed ?Overall Cognitive Status: Impaired/Different from baseline ?Area of Impairment: Safety/judgement, Problem solving ?  ?  ?  ?  ?  ?  ?  ?  ?  ?  ?  ?  ?Safety/Judgement: Decreased awareness of safety, Decreased awareness of deficits ?  ?Problem Solving: Slow processing, Requires verbal cues, Requires tactile cues ?General Comments: pleasant  with pt with wet back pack and linens in bed with pt unaware. pt aware of difficulty with ataxia but cannot plan or problem solve how to function without direct cues ?  ?  ? ?  ?Exercises General Exercises - Lower Extremity ?Long Arc Quad: AROM, Both, 15 reps, Seated ?Hip Flexion/Marching: AROM, Both, 15 reps, Seated ? ?  ?General Comments   ?  ?  ? ?Pertinent Vitals/Pain Pain Assessment ?Pain Assessment: No/denies pain   ? ? ?Home Living   ?  ?  ?  ?  ?  ?  ?  ?  ?  ?   ?  ?Prior Function    ?  ?  ?   ? ?PT Goals (current goals can now be found in the care plan section) Progress towards PT goals: Progressing toward goals ? ?  ?Frequency ? ? ? Min 3X/week ? ? ? ?  ?PT Plan Current plan remains appropriate  ? ? ?Co-evaluation   ?  ?  ?  ?  ? ?  ?AM-PAC PT "6 Clicks" Mobility   ?Outcome Measure ? Help needed turning from your back to your side while in a flat bed without using bedrails?: A Little ?Help needed moving from lying on your back to sitting on the side of a flat bed without using bedrails?: A Lot ?Help needed moving to and from a bed to a chair (including a wheelchair)?: A Lot ?Help needed standing up from a chair using your arms (e.g., wheelchair or bedside chair)?: A Lot ?Help needed to walk in hospital room?: Total ?Help needed climbing 3-5 steps with a railing? : Total ?6 Click Score: 11 ? ?  ?End of Session Equipment Utilized During Treatment: Gait belt;Oxygen ?Activity Tolerance: Patient tolerated treatment well ?Patient left: in chair;with call bell/phone within reach;with chair alarm set ?Nurse Communication: Mobility status;Precautions ?PT Visit Diagnosis: Other abnormalities of gait and mobility (R26.89);Difficulty in walking, not elsewhere classified (R26.2);Muscle weakness (generalized) (M62.81);Other symptoms and signs involving the nervous system (R29.898) ?  ? ? ?Time: 8546-2703 ?PT Time Calculation (min) (ACUTE ONLY): 30 min ? ?Charges:  $Therapeutic Exercise: 8-22 mins ?$Therapeutic Activity: 8-22 mins          ?          ? ?Gerda Yin P, PT ?Acute Rehabilitation Services ?Pager: [phone] ?Office: [phone] ? ? ? ?Vashti Bolanos B Javonte Elenes ?06/07/2021, 10:36 AM ? ?

## 2021-06-07 NOTE — TOC Progression Note (Signed)
Transition of Care (TOC) - Progression Note  ? ? ?Patient Details  ?Name: Raymond [name] ?MRN: [id number] ?Date of Birth: 1960/12/27 ? ?Transition of Care (TOC) CM/SW Contact  ?Milas Gain, LCSWA ?Phone Number: ?06/07/2021, 11:54 AM ? ?Clinical Narrative:    ? ?Bryson Ha with [facility] informed CSW that she received insurance authorization approval for patient. Patient has SNF bed at [facility] when medically ready for dc. CSW informed MD. CSW will continue to follow and assist with patients dc planning needs. ? ?Expected Discharge Plan: [place] ?Barriers to Discharge: Continued Medical Work up ? ?Expected Discharge Plan and Services ?Expected Discharge Plan: [place] ?In-house Referral: Clinical Social Work ?  ?  ?Living arrangements for the past 2 months: [place] ?                ?  ?  ?  ?  ?  ?  ?  ?  ?  ?  ? ? ?Social Determinants of Health (SDOH) Interventions ?  ? ?Readmission Risk Interventions ? ?  12/13/2020  ?  2:02 PM  ?Readmission Risk Prevention Plan  ?Transportation Screening Complete  ?PCP or Specialist Appt within 5-7 Days Complete  ?Home Care Screening Complete  ?Medication Review (RN CM) Complete  ? ? ?

## 2021-06-07 NOTE — Progress Notes (Signed)
Subjective: ?No acute events. ? ?Objective: ?Vital signs in last 24 hours: ?Temp:  [97.8 ?F (36.6 ?C)] 97.8 ?F (36.6 ?C) (03/29 2017) ?Pulse Rate:  [79-101] 87 (03/30 1406) ?Resp:  [15-17] 17 (03/30 1406) ?BP: (108)/(66-67) 108/67 (03/29 2017) ?SpO2:  [92 %-100 %] 95 % (03/30 1406) ?FiO2 (%):  [32 %] 32 % (03/30 1406) ?Last BM Date : 06/05/21 ? ?Intake/Output from previous day: ?03/29 0701 - 03/30 0700 ?In: 828.4 [I.V.:437.9; IV Piggyback:390.5] ?Out: 1625 [Urine:1625] ?Intake/Output this shift: ?Total I/O ?In: 120 [P.O.:120] ?Out: -  ? ?General appearance: alert and no distress ?GI: soft, non-tender; bowel sounds normal; no masses,  no organomegaly ? ?Lab Results: ?Recent Labs  ?  06/06/21 ?0803 06/06/21 ?1823 06/07/21 ?9604  ?WBC  --   --  16.0*  ?HGB 9.7* 8.5* 8.1*  ?HCT 32.3* 28.1* 27.0*  ?PLT  --   --  324  ? ?BMET ?Recent Labs  ?  06/05/21 ?5409 06/06/21 ?8119 06/07/21 ?1478  ?NA 135 137 136  ?K 3.7 3.4* 3.3*  ?CL 103 102 101  ?CO2 '26 28 30  '$ ?GLUCOSE 92 79 85  ?BUN 25* 17 13  ?CREATININE 0.57* 0.53* 0.56*  ?CALCIUM 8.0* 8.3* 8.1*  ? ?LFT ?No results for input(s): PROT, ALBUMIN, AST, ALT, ALKPHOS, BILITOT, BILIDIR, IBILI in the last 72 hours. ?PT/INR ?No results for input(s): LABPROT, INR in the last 72 hours. ?Hepatitis Panel ?No results for input(s): HEPBSAG, HCVAB, HEPAIGM, HEPBIGM in the last 72 hours. ?C-Diff ?No results for input(s): CDIFFTOX in the last 72 hours. ?Fecal Lactopherrin ?No results for input(s): FECLLACTOFRN in the last 72 hours. ? ?Studies/Results: ?US RENAL ? ?Result Date: 06/06/2021 ?CLINICAL DATA:  Right hydronephrosis EXAM: RENAL / URINARY TRACT ULTRASOUND COMPLETE COMPARISON:  06/04/2021 CT abdomen pelvis FINDINGS: Right Kidney: Renal measurements: 11 x 4.7 x 4.4 cm = volume: 120 mL. Echogenicity within normal limits. Moderate hydronephrosis. No mass. Left Kidney: Renal measurements: 11.4 x 6.2 x 4.7 cm = volume: 173 mL. Echogenicity within normal limits. No mass or hydronephrosis  visualized. Trace perinephric fluid. Bladder: Appears normal for degree of bladder distention. Other: Incidental note is made of ascites and a left pleural effusion. IMPRESSION: 1. Moderate right hydronephrosis. 2. The left kidney is normal in appearance, with trace perinephric fluid. 3. Left pleural effusion. 4. Ascites. Electronically Signed   By: Merilyn Baba M.D.   On: 06/06/2021 19:42   ? ?Medications: Scheduled: ? allopurinol  100 mg Oral q AM  ? buPROPion  150 mg Oral Q0600  ? Chlorhexidine Gluconate Cloth  6 each Topical Daily  ? feeding supplement  237 mL Oral TID BM  ? fluconazole  300 mg Oral Daily  ? gabapentin  100 mg Oral TID  ? guaiFENesin  600 mg Oral BID  ? [START ON 06/08/2021] influenza vac split quadrivalent PF  0.5 mL Intramuscular Tomorrow-1000  ? insulin aspart  0-9 Units Subcutaneous TID WC  ? ipratropium-albuterol  3 mL Nebulization TID  ? isosorbide mononitrate  15 mg Oral Daily  ? magnesium oxide  400 mg Oral Daily  ? mouth rinse  15 mL Mouth Rinse BID  ? metoCLOPramide (REGLAN) injection  5 mg Intravenous Q6H  ? multivitamin with minerals  1 tablet Oral Daily  ? nicotine  21 mg Transdermal Daily  ? nutrition supplement (JUVEN)  1 packet Oral BID BM  ? pantoprazole  40 mg Intravenous Q12H  ? predniSONE  40 mg Oral Q breakfast  ? rosuvastatin  40 mg Oral  Daily  ? saccharomyces boulardii  250 mg Oral BID  ? tamsulosin  0.4 mg Oral q AM  ? ?Continuous: ? ?Assessment/Plan: ?1) Large gastric ulcer. ?2) Anemia - stable. ? ? The patient is stable.  His HGB is at 8.1 g/dL.   ? ?Plan: ?1) Continue with pantoprazole. ?2) Follow HGB and transfuse if necessary. ? LOS: 6 days  ? ?Durward Matranga D ?06/07/2021, 3:19 PM  ?

## 2021-06-08 ENCOUNTER — Encounter (HOSPITAL_COMMUNITY): Payer: Self-pay | Admitting: Internal Medicine

## 2021-06-08 ENCOUNTER — Inpatient Hospital Stay (HOSPITAL_COMMUNITY): Payer: Medicare HMO

## 2021-06-08 DIAGNOSIS — I10 Essential (primary) hypertension: Secondary | ICD-10-CM | POA: Diagnosis not present

## 2021-06-08 DIAGNOSIS — T8789 Other complications of amputation stump: Secondary | ICD-10-CM | POA: Diagnosis not present

## 2021-06-08 DIAGNOSIS — J9621 Acute and chronic respiratory failure with hypoxia: Secondary | ICD-10-CM | POA: Diagnosis not present

## 2021-06-08 DIAGNOSIS — E1165 Type 2 diabetes mellitus with hyperglycemia: Secondary | ICD-10-CM | POA: Diagnosis not present

## 2021-06-08 DIAGNOSIS — I639 Cerebral infarction, unspecified: Secondary | ICD-10-CM | POA: Diagnosis not present

## 2021-06-08 DIAGNOSIS — J441 Chronic obstructive pulmonary disease with (acute) exacerbation: Secondary | ICD-10-CM | POA: Diagnosis not present

## 2021-06-08 DIAGNOSIS — D62 Acute posthemorrhagic anemia: Secondary | ICD-10-CM | POA: Diagnosis not present

## 2021-06-08 HISTORY — DX: Cerebral infarction, unspecified: I63.9

## 2021-06-08 LAB — LIPID PANEL
Cholesterol: 57 mg/dL (ref 0–200)
HDL: 41 mg/dL (ref >40)
LDL Cholesterol: 6 mg/dL (ref 0–99)
Total CHOL/HDL Ratio: 1.4 RATIO
Triglycerides: 50 mg/dL (ref <150)
VLDL: 10 mg/dL (ref 0–40)

## 2021-06-08 LAB — BASIC METABOLIC PANEL
Anion gap: 7 (ref 5–15)
BUN: 15 mg/dL (ref 6–20)
CO2: 28 mmol/L (ref 22–32)
Calcium: 8.1 mg/dL — ABNORMAL LOW (ref 8.9–10.3)
Chloride: 99 mmol/L (ref 98–111)
Creatinine, Ser: 0.61 mg/dL (ref 0.61–1.24)
GFR, Estimated: >60 mL/min (ref >60)
Glucose, Bld: 88 mg/dL (ref 70–99)
Potassium: 3.9 mmol/L (ref 3.5–5.1)
Sodium: 134 mmol/L — ABNORMAL LOW (ref 135–145)

## 2021-06-08 LAB — GLUCOSE, CAPILLARY
Glucose-Capillary: 108 mg/dL — ABNORMAL HIGH (ref 70–99)
Glucose-Capillary: 162 mg/dL — ABNORMAL HIGH (ref 70–99)
Glucose-Capillary: 159 mg/dL — ABNORMAL HIGH (ref 70–99)
Glucose-Capillary: 87 mg/dL (ref 70–99)

## 2021-06-08 LAB — CBC
HCT: 29.2 % — ABNORMAL LOW (ref 39.0–52.0)
Hemoglobin: 9.1 g/dL — ABNORMAL LOW (ref 13.0–17.0)
MCH: 24.5 pg — ABNORMAL LOW (ref 26.0–34.0)
MCHC: 31.2 g/dL (ref 30.0–36.0)
MCV: 78.5 fL — ABNORMAL LOW (ref 80.0–100.0)
Platelets: 401 10*3/uL — ABNORMAL HIGH (ref 150–400)
RBC: 3.72 MIL/uL — ABNORMAL LOW (ref 4.22–5.81)
RDW: 23.3 % — ABNORMAL HIGH (ref 11.5–15.5)
WBC: 20.5 10*3/uL — ABNORMAL HIGH (ref 4.0–10.5)
nRBC: 0 % (ref 0.0–0.2)

## 2021-06-08 MED ORDER — SODIUM CHLORIDE 0.9 % IV SOLN: INTRAVENOUS | Status: DC

## 2021-06-08 MED ORDER — IPRATROPIUM-ALBUTEROL 0.5-2.5 (3) MG/3ML IN SOLN
3.0000 mL | Freq: Two times a day (BID) | RESPIRATORY_TRACT | Status: DC
  Administered 2021-06-09 – 2021-06-15 (×13): 3 mL via RESPIRATORY_TRACT
  Filled 2021-06-08 (×14): qty 3

## 2021-06-08 MED ORDER — STROKE: EARLY STAGES OF RECOVERY BOOK
Freq: Once | Status: DC
  Filled 2021-06-08: qty 1

## 2021-06-08 NOTE — Consult Note (Deleted)
Neurology Consult H&P ? ?Raymond [name] ?MR# [id number] ?06/08/2021 ? ? ?CC: 61 year old male admitted to hospitalist service for acute and chronic resp failure with hypoxia since 05/31/21. Per consult request, Patient with significant BUE and BLE ataxia-been progressing over the past 5 months. Per patient, his ataxia has been progressing 3 weeks.  ? ?History is obtained from: Patient, Attending MD and chart. ? ?HPI: Raymond [name] is a 61 y.o. male who reported his PMHX to include CVA 11/10/2013, several back surgeries, HTN, Tob dependence, Hip pain and hypercholesteremia. Today Neurology was consulted due to stroke found on MRI and BUE/BLE ataxia.  ? ? ?LKW: unknown ?tNK given: No outside of window ?IR Thrombectomy No, not indicated , no LVO presentation ?Modified Rankin Scale: 5-Severe disability-bedridden, incontinent, needs constant attention ?NIHSS: NIH Stroke Scale ?1a. Level of Consciousness 0-Alert ?1b. Level of Consciousness Questions 0-Answers both questions correctly ?1c. Level of Consciousness Commands 0-Performs both tasks correctly ?2. Best Gaze 0-Normal ?3. Visual 0-No visual loss ?4. Facial Palsy 0-Normal ?5a. Motor Arm Left 0-No drift ?5b. Motor Arm Right 0-No drift ?6a. Motor Leg Left 1-Drift but does not hit bed ?6b. Motor Leg Right 1-Drift but does not hit bed ?7. Ataxia 2-Present in two limbs ?8. Sensory 1-Mild to moderate sensory loss ?9. Best Language 0-No aphasia ?10. Dysarthria 0-Normal ?11. Extinction and Inattention 0-No abnormality ? ?NIHSS Total: 5   ? ?ROS: A complete ROS was performed and is negative except as noted in the HPI.   ?Past Medical History:  ?Diagnosis Date  ? Anxiety   ? Back pain   ? Bipolar disorder ([place])   ? Borderline hypertension   ? Chronic pain   ? COPD (chronic obstructive pulmonary disease) ([place])   ? Depression with anxiety   ? GERD (gastroesophageal reflux disease)   ? Headache   ? Hip pain   ? Hypercholesterolemia   ? Right hemisphere, cerebral infarction ([place]) 11/10/2013   ? Stroke [facility])   ? Tobacco use   ? ? ? ?Family History  ?Problem Relation Age of Onset  ? Stomach cancer Mother   ? Throat cancer Father   ? Colon cancer Neg Hx   ? ? ?Social History:  reports that he has been smoking cigarettes. He has a 45.00 pack-year smoking history. He has never used smokeless tobacco. He reports that he does not drink alcohol and does not use drugs. ? ? ?Prior to Admission medications   ?Medication Sig Start Date End Date Taking? Authorizing Provider  ?albuterol (ACCUNEB) 0.63 MG/3ML nebulizer solution Take 1 ampule by nebulization every 6 (six) hours as needed for wheezing or shortness of breath. 10/13/12  Yes [provider]  ?albuterol (PROVENTIL HFA;VENTOLIN HFA) 108 (90 BASE) MCG/ACT inhaler Inhale 2 puffs into the lungs every 6 (six) hours as needed for wheezing.   Yes [provider]  ?allopurinol (ZYLOPRIM) 100 MG tablet Take 100 mg by mouth in the morning. 10/13/12  Yes [provider]  ?aspirin 81 MG tablet Take 81 mg by mouth daily.   Yes [provider]  ?buPROPion (WELLBUTRIN XL) 150 MG 24 hr tablet Take 1 tablet by mouth daily at 6 (six) AM. 05/09/20  Yes [provider]  ?calcium carbonate (TUMS EX) 750 MG chewable tablet Chew 2 tablets by mouth as needed for heartburn.   Yes [provider]  ?clopidogrel (PLAVIX) 75 MG tablet Take 1 tablet (75 mg total) by mouth daily at 6 (six) AM. 12/14/20  Yes  Georgette Shell, MD  ?gabapentin (NEURONTIN) 300 MG capsule Take 300 mg by mouth 3 (three) times daily.   Yes [provider]  ?Magnesium Hydroxide (DULCOLAX SOFT CHEWS) 1200 MG CHEW Chew 1 tablet by mouth as needed (constipation).   Yes [provider]  ?magnesium oxide (MAG-OX) 400 MG tablet Take 1 tablet by mouth daily. 11/27/20  Yes [provider]  ?metFORMIN (GLUCOPHAGE) 500 MG tablet Take 2 tablets by mouth 2 (two) times daily.   Yes [provider]  ?metoprolol tartrate (LOPRESSOR) 25 MG  tablet TAKE 1/2 TABLET BY MOUTH TWICE DAILY Needs appointment for further refills ?Patient taking differently: Take 12.5 mg by mouth 2 (two) times daily. 05/28/21  Yes BranchAlphonse Guild, MD  ?omeprazole (PRILOSEC) 40 MG capsule Take 1 capsule by mouth daily at 6 (six) AM. 05/09/20  Yes [provider]  ?ondansetron (ZOFRAN) 4 MG tablet Take 4 mg by mouth 3 (three) times daily as needed. 09/27/20  Yes [provider]  ?rosuvastatin (CRESTOR) 10 MG tablet Take 2 tablets (20 mg total) by mouth at bedtime. 12/13/20  Yes Georgette Shell, MD  ?tamsulosin (FLOMAX) 0.4 MG CAPS capsule Take 0.4 mg by mouth in the morning.   Yes [provider]  ?feeding supplement (ENSURE ENLIVE / ENSURE PLUS) LIQD Take 237 mLs by mouth 3 (three) times daily between meals. 06/03/21   Lavina Hamman, MD  ?guaiFENesin (MUCINEX) 600 MG 12 hr tablet Take 1 tablet (600 mg total) by mouth 2 (two) times daily. 06/03/21 07/04/21  Lavina Hamman, MD  ?Multiple Vitamin (MULTIVITAMIN WITH MINERALS) TABS tablet Take 1 tablet by mouth daily. 06/03/21   Lavina Hamman, MD  ?nicotine (NICODERM CQ - DOSED IN MG/24 HOURS) 21 mg/24hr patch Place 1 patch (21 mg total) onto the skin daily. 06/03/21   Lavina Hamman, MD  ?nitroGLYCERIN (NITROSTAT) 0.4 MG SL tablet Place 1 tablet (0.4 mg total) under the tongue every 5 (five) minutes x 3 doses as needed for chest pain. 06/03/21   Lavina Hamman, MD  ?nutrition supplement, JUVEN, (JUVEN) PACK Take 1 packet by mouth 2 (two) times daily between meals. 06/03/21   Lavina Hamman, MD  ?oxyCODONE-acetaminophen (PERCOCET) 10-325 MG tablet Take 1-2 tablets by mouth 4 (four) times daily as needed for pain. max FIVE PER DAY 06/03/21   Lavina Hamman, MD  ?predniSONE (DELTASONE) 10 MG tablet Take '40mg'$  daily for 3days,Take '30mg'$  daily for 3days,Take '20mg'$  daily for 3days,Take '10mg'$  daily for 3days, then stop 06/03/21   Lavina Hamman, MD  ?rosuvastatin (CRESTOR) 40 MG tablet Take 1 tablet (40 mg total)  by mouth daily. 06/03/21   Lavina Hamman, MD  ?saccharomyces boulardii (FLORASTOR) 250 MG capsule Take 1 capsule (250 mg total) by mouth 2 (two) times daily. 06/03/21   Lavina Hamman, MD  ?silver sulfADIAZINE (SILVADENE) 1 % cream Apply 1 application topically 2 (two) times daily. apply a 1/16 inch (1.5 mm) thick layer to entire burn area 11/21/20   [provider]  ? ? ?Exam: ?Current vital signs: ?BP 120/64   Pulse 99   Temp 98.4 ?F (36.9 ?C) (Oral)   Resp 12   Ht '5\' 7"'$  (1.702 m)   Wt 63.3 kg   SpO2 93%   BMI 21.86 kg/m?  ? ?Physical Exam  ?Constitutional: Thin ill appearing 61 year old male ?Psych: Affect appropriate to situation ?Eyes: No scleral injection ?HENT: No OP obstruction. ?Head: Normocephalic.  ?Cardiovascular: Normal  rate and regular rhythm.  ?Respiratory: Effort normal, symmetric excursions bilaterally, no audible wheezing. ?GI: Soft.  No distension. There is no tenderness.  ?Skin: WDI, Right toe amputation wrapped with ace bandage ? ?Neuro: ?Mental Status: ?Patient is awake, alert, oriented to person, place, month, year, and situation. ?Patient is able to give a clear and coherent history. ?Speech is fluent, intact comprehension and repetition. ?No signs of aphasia or neglect. ?Visual Fields are full. Pupils are equal, round, and reactive to light. ?EOMI without ptosis or diploplia.  ?Facial sensation is symmetric to temperature ?Facial movement is symmetric.  ?Hearing is intact to voice. ?Uvula midline and palate elevates symmetrically. ?Shoulder shrug is symmetric. ?Tongue is midline without atrophy or fasciculations.  ?Tone is normal. Bulk is normal. 5/5 strength was present in all four extremities. ?Sensation is symmetric to light touch and temperature in the arms and legs. Some decreased sensation/numbness in left hand ?Toes are downgoing bilaterally. ?FNF and HKS are abnormal bilaterally. ?Gait - Deferred and per PT/OT notes patient is a two assist and knee blocking to balance  while stepping and cannot stand alone however does activate hips and knees to assist with standing.   ? ?I have reviewed labs in epic and the pertinent results are: ?Hgb A1C-5.1, Vit B12-360  ? ?I have reviewe

## 2021-06-08 NOTE — Progress Notes (Signed)
Inpatient Rehab Admissions Coordinator Note:  ? ?Per updated therapy recommendations patient was screened for CIR candidacy by Michel Santee, PT. At this time, pt appears to be a potential candidate for CIR. I will place an order for rehab consult for full assessment, per our protocol.  Please contact me any with questions.. ? ?Shann Medal, PT, DPT ?[phone] ?06/08/21 ?4:53 PM  ?

## 2021-06-08 NOTE — Progress Notes (Signed)
Physical Therapy Treatment ?Patient Details ?Name: Raymond [name] ?MRN: [id number] ?DOB: Jun 27, 1960 ?Today's Date: 06/08/2021 ? ? ?History of Present Illness 61 yo admitted 3/23 with chest pain. 3/24 pt s/p cardiac cath with coffee ground emesis and respiratory failure. 3/29 EGD. 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 ?  ?PT Comments  ? ? The pt is highly motivated and eager to work with therapy this morning. SpO2 82% on 3L upon our arrival, improved to 94% on 5L and maintained in 90s through session. The pt was able to maintain static sitting at EOB, and participated in exercises for UE and LE coordination with minor LOB. The pt was then able to complete sit-stand transfers with modA of 1 and bilateral knee blocking, and completed x4 partial squats from standing position for LE strengthening. He was able to initiate steps for pivotal steps to recliner, but required mod-maxA of 2 to safely maintain balance with stepping. Given significant coordination and strength deficits noted this admission and pt's motivation to return to greater independence, recommend acute inpatient rehab after d/c to facilitate return to greater independence with mobility and transfers and to improve safety with mobility.    ?  ?Recommendations for follow up therapy are one component of a multi-disciplinary discharge planning process, led by the attending physician.  Recommendations may be updated based on patient status, additional functional criteria and insurance authorization. ? ?Follow Up Recommendations ? Acute inpatient rehab (3hours/day) ?  ?  ?Assistance Recommended at Discharge Frequent or constant Supervision/Assistance  ?Patient can return home with the following A lot of help with walking and/or transfers;A little help with bathing/dressing/bathroom;Assistance with cooking/housework;Assist for transportation;Help with stairs or ramp for entrance ?  ?Equipment Recommendations ?  Wheelchair (measurements PT);Wheelchair cushion (measurements PT)  ?  ?Recommendations for Other Services   ? ? ?  ?Precautions / Restrictions Precautions ?Precautions: Fall ?Precaution Comments: Watch O2, incontinent, ataxia bil UE and RLE ?Restrictions ?Weight Bearing Restrictions: No  ?  ? ?Mobility ? Bed Mobility ?Overal bed mobility: Needs Assistance ?Bed Mobility: Supine to Sit ?  ?  ?Supine to sit: Min assist, HOB elevated ?  ?  ?General bed mobility comments: minA to complete transition, poor ability to generate movement with UE ?  ? ?Transfers ?Overall transfer level: Needs assistance ?Equipment used: 1 person hand held assist, 2 person hand held assist ?Transfers: Sit to/from Stand ?Sit to Stand: Mod assist ?  ?Step pivot transfers: Mod assist, +2 physical assistance, +2 safety/equipment ?  ?  ?  ?General transfer comment: pt completed x4 sit-stand from EOB with x4 partial squat and return to upright standing. relies on bilateral blocking of BLE and modA at trunk, but was able to activate at hips and knees to improve standing position. ModA of 2 to take small steps to R to pivot to chair, good initiation but mod-maxA to maintain balance ?  ? ?Ambulation/Gait ?  ?  ?  ?  ?  ?  ?  ?General Gait Details: only pivotal steps at this time ? ? ? ? ?  ?Balance Overall balance assessment: Needs assistance ?Sitting-balance support: No upper extremity supported, Feet supported ?Sitting balance-Leahy Scale: Fair ?Sitting balance - Comments: static sitting with guarding for safety ?  ?Standing balance support: No upper extremity supported ?Standing balance-Leahy Scale: Poor ?Standing balance comment: bil knees blocked and physical assist with belt to maintain standing ?  ?  ?  ?  ?  ?  ?  ?  ?  ?  ?  ?  ? ?  ?  Cognition Arousal/Alertness: Awake/alert ?Behavior During Therapy: [facility] for tasks assessed/performed ?Overall Cognitive Status: Impaired/Different from baseline ?Area of Impairment: Safety/judgement, Problem  solving ?  ?  ?  ?  ?  ?  ?  ?  ?  ?  ?  ?  ?Safety/Judgement: Decreased awareness of safety, Decreased awareness of deficits ?  ?Problem Solving: Slow processing, Requires verbal cues, Requires tactile cues ?General Comments: pleasant with pt with wet back pack and linens in bed with pt unaware. pt aware of difficulty with ataxia but cannot plan or problem solve how to function without direct cues ?  ?  ? ?  ?Exercises Other Exercises ?Other Exercises: sit-stand with partial squat with modA at trunk and bilateral knee blocking. x4 ?Other Exercises: toe taps to bottle on the floor. x10 each foot with dysmetria and jerky movements. knocked over x3 with RLE ? ?  ?General Comments General comments (skin integrity, edema, etc.): SpO2 82% on 3L upon arrival, improved to 95% on 5L. ?  ?  ? ?Pertinent Vitals/Pain Pain Assessment ?Pain Assessment: No/denies pain ?Pain Intervention(s): Monitored during session  ? ? ? ?PT Goals (current goals can now be found in the care plan section) Acute Rehab PT Goals ?Patient Stated Goal: be able to return to walking and home ?PT Goal Formulation: With patient ?Time For Goal Achievement: 06/15/21 ?Potential to Achieve Goals: Fair ?Progress towards PT goals: Progressing toward goals ? ?  ?Frequency ? ? ? Min 3X/week ? ? ? ?  ?PT Plan Current plan remains appropriate  ? ? ?Co-evaluation PT/OT/SLP Co-Evaluation/Treatment: Yes ?Reason for Co-Treatment: For patient/therapist safety;Complexity of the patient's impairments (multi-system involvement);To address functional/ADL transfers ?PT goals addressed during session: Mobility/safety with mobility;Balance;Proper use of DME;Strengthening/ROM ?  ?  ? ?  ?AM-PAC PT "6 Clicks" Mobility   ?Outcome Measure ? Help needed turning from your back to your side while in a flat bed without using bedrails?: A Little ?Help needed moving from lying on your back to sitting on the side of a flat bed without using bedrails?: A Lot ?Help needed moving to and  from a bed to a chair (including a wheelchair)?: A Lot ?Help needed standing up from a chair using your arms (e.g., wheelchair or bedside chair)?: A Lot ?Help needed to walk in hospital room?: Total ?Help needed climbing 3-5 steps with a railing? : Total ?6 Click Score: 11 ? ?  ?End of Session Equipment Utilized During Treatment: Gait belt;Oxygen ?Activity Tolerance: Patient tolerated treatment well ?Patient left: in chair;with call bell/phone within reach;with chair alarm set ?Nurse Communication: Mobility status;Precautions ?PT Visit Diagnosis: Other abnormalities of gait and mobility (R26.89);Difficulty in walking, not elsewhere classified (R26.2);Muscle weakness (generalized) (M62.81);Other symptoms and signs involving the nervous system (R29.898) ?  ? ? ?Time: [phone] ?PT Time Calculation (min) (ACUTE ONLY): 36 min ? ?Charges:  $Therapeutic Exercise: 8-22 mins          ?          ? ?West Carbo, PT, DPT  ? ?Acute Rehabilitation Department ?Pager #: [phone] - [phone] ? ? ?Sandra Cockayne ?06/08/2021, 10:04 AM ? ?

## 2021-06-08 NOTE — Progress Notes (Signed)
? ? ? [facility] ?                                                                            ? ? ?Raymond [name], is a 61 y.o. male, DOB - May 10, 1960, OEU:[id number] ?Admit date - 05/31/2021    ?Outpatient Primary MD for the patient is [name], MD ? ?LOS - 7  days ? ? ? ?Brief summary  ? ?Past medical history of CAD SP PCI, COPD, chronic respiratory failure, GERD, active smoker, PVD, type II DM.  Presents with complaints of chest pain and elevated troponin as well as COPD exacerbation. ?Cardiology was consulted.  Underwent cardiac catheterization on 3/24 which showed nonobstructive CAD.  Medical management recommended. Patient developed Malena, associated with hypotension and tachycardia.  ?GI consulted and he was started on PPI GTT and transferred to ICU for hypotension and possible pressors.  ?Pt did not require vasopressors and he was transferred to [facility] on 06/05/21.  ?Patient underwent EGD on 3/29 that showed large cratered ulcer in distal third of stomach ? ? ?Assessment & Plan  ? ? ?Assessment and Plan: ? ? ? ?GI bleed.  ?Likely upper GI bleed, ?EGD on 3/29 showed large cratered ulcer in distal third of stomach ?Continue PPI ?Discussed with GI and recommended to continue to hold aspirin and plavix for another 7 days (until 06/14/2021) ?Continue to follow hemoglobin ? ?Acute blood loss anemia ?-Secondary to GI bleeding ?-Status post 2 units PRBC ?-Continue to follow hemoglobin ? ?Retained gastric contents ?-May have component of gastroparesis since he is on chronic opiates ?-Appears to be tolerating Reglan ? ?Esophageal candidiasis ?-Started on Diflucan ? ?Ataxia ?-Patient reports significant/progressive bilateral upper extremity and lower extremity limb ataxia which has been progressing over the past 5 months ?-Currently, patient has difficulty feeding himself/drinking out of a cup ?-He has difficulty walking with a walker since he is unable to support his weight on his arms ?-Etiology of symptoms is  not entirely clear ?-MRI brain shows normal volume of cerebellum, but does comment on small acute infarct in posterior right frontal lobe (suspect incidental) ?-B12 [id number] ?-discussed with neurology, Dr. Quinn Axe who will see in consult ?-PT/OT following, now recommending CIR ? ?* Acute and chronic respiratory failure with hypoxia (HCC) ?COPD with acute exacerbation secondary to community-acquired pneumonia. ?Uses oxygen as needed at home ?Completed the course of doxycycline and currently on prednisone taper ?Continue with bronchodilators .  ? ?NSTEMI (non-ST elevated myocardial infarction) ([place]) ?HLD. ?HTN. ?Presented with complaints of chest tightness. ?Troponins were minimally elevated patient was started on IV heparin drip.  Cardiology considers this as non-STEMI. ?Underwent cardiac catheterization which shows nonobstructive CAD. ?Echocardiogram performed, EF 55 to 60%. ?Continue with crestor, and anti platelet agents are on hold for GI bleed.  ?Cardiology currently signed off. ?Outpatient follow-up. ? 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 Abner Greenspan who felt it would be reasonable to have patient follow-up with urology in the next couple  weeks with repeat CT abdomen with renal stone protocol ? ?Depression ?We will continue Wellbutrin XL. ? ?Chronic degenerative disc disease ?Reports having surgery on the lumbar spine as well as cervical spine in the past (between 10 to 20 years ago) ?Imaging indicates ACDF as well as lumbar fusion in the past ?His neurosurgeon, Dr. Carloyn Manner is now retired ? ? 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  Monitor. ? ?Protein-calorie  malnutrition, severe ?Body mass index is 20.94 kg/m?[name] ?Nutrition Problem: Severe Malnutrition (in the context of chronic illness) ?Etiology: poor appetite ?Nutrition Interventions: ?Interventions: Refer to RD note for recommendations  ? ?Pressure injury of skin ?Present on admission. ?Left lower buttock. ?Wound care consulted. ?For right toe ulcer, cleanse right lateral foot wound with saline, pat dry. Place a small piece of Xeroform gauze over the wound, then dry gauze, secure with a few turns of kerlix. ?  ?For the left buttock/hip wound, cleanse with saline, place a small piece of xeroform gauze over the wound, and cover with a foam dressing. Change both every 2 days. ? ? ?Tobacco use ?Continue nicotine patch. ?Patient was counseled on tobacco cessation. ? ? ?Leukocytosis:  ?Possibly from prednisone.  ? ?  ? ? ?RN Pressure Injury Documentation: ?Pressure Injury 05/31/21 Sacrum Medial Stage 1 -  Intact skin with non-blanchable redness of a localized area usually over a bony prominence. (Active)  ?05/31/21 2100  ?Location: Sacrum  ?Location Orientation: Medial  ?Staging: Stage 1 -  Intact skin with non-blanchable redness of a localized area usually over a bony prominence.  ?Wound Description (Comments):   ?Present on Admission: Yes  ?Dressing Type Foam - Lift dressing to assess site every shift 06/06/21 0800  ?   ?Pressure Injury 05/31/21 Ischial tuberosity Left Stage 2 -  Partial thickness loss of dermis presenting as a shallow open injury with a red, pink wound bed without slough. (Active)  ?05/31/21 2300  ?Location: Ischial tuberosity  ?Location Orientation: Left  ?Staging: Stage 2 -  Partial thickness loss of dermis presenting as a shallow open injury with a red, pink wound bed without slough.  ?Wound Description (Comments):   ?Present on Admission: Yes  ?Dressing Type Foam - Lift dressing to assess site every shift 06/07/21 2026  ? ? ?Malnutrition Type: ? ?Nutrition Problem: Severe Malnutrition (in the context  of chronic illness) ?Etiology: poor appetite ? ? ?Malnutrition Characteristics: ? ?Signs/Symptoms: severe fat depletion, severe muscle depletion, percent weight loss (20.6% x 11.5 months) ?Percent weight loss: 20.6 % ? ? ?Nutrition Interventions: ? ?Interventions: Refer to RD note for recommendations ? ?Estimated body mass index is 21.86 kg/m? as calculated from the following: ?  Height as of this encounter: '5\' 7"'$  (1.702 m). ?  Weight as of this encounter: 63.3 kg. ? ?Code Status: full code.  ?DVT Prophylaxis:  Place and maintain sequential compression device Start: 06/05/21 1735 ? ? ?Level of Care: Level of care: Telemetry Medical ?Family Communication: discussed with daughter at bedside ? ?Disposition Plan:     Remains inpatient appropriate:  Awaiting SNF placement ? ?Procedures:  ?3/29 EGD: candidal esophagitis, large non-obstructing, cratered ulcer occupying distal 1/3 of stomach in pre-pyloric region with patchy areas of necrosis. Large amount of solid debris in stomach ? ?Consultants:   ? ?Gastroenterology ?PCCM.  ?Neurology ? ?Antimicrobials:  ? ?Anti-infectives (From admission, onward)  ? ? Start     Dose/Rate Route Frequency Ordered Stop  ? 06/07/21 1000  fluconazole (DIFLUCAN) tablet 300 mg       ?  300 mg Oral Daily 06/06/21 1806 06/20/21 0959  ? 06/06/21 1900  fluconazole (DIFLUCAN) tablet 400 mg       ? 400 mg Oral  Once 06/06/21 1806 06/06/21 2134  ? 06/03/21 0000  doxycycline (VIBRA-TABS) 100 MG tablet       ? 100 mg Oral 2 times daily 06/03/21 0800 06/06/21 2359  ? 06/01/21 2200  doxycycline (VIBRA-TABS) tablet 100 mg       ? 100 mg Oral Every 12 hours 06/01/21 1847 06/06/21 2159  ? ?  ? ? ? ?Medications ? ?Scheduled Meds: ? allopurinol  100 mg Oral q AM  ? buPROPion  150 mg Oral Q0600  ? Chlorhexidine Gluconate Cloth  6 each Topical Daily  ? feeding supplement  237 mL Oral TID BM  ? fluconazole  300 mg Oral Daily  ? gabapentin  100 mg Oral TID  ? guaiFENesin  600 mg Oral BID  ? influenza vac split  quadrivalent PF  0.5 mL Intramuscular Tomorrow-1000  ? insulin aspart  0-9 Units Subcutaneous TID WC  ? ipratropium-albuterol  3 mL Nebulization TID  ? isosorbide mononitrate  15 mg Oral Daily  ? magn

## 2021-06-08 NOTE — Progress Notes (Signed)
Patient requested vaccine and when reviewing questions for influenza vaccine, stated he had an allergy to latex. Previously had flu vaccine and had rash and itching at the site. Patient also has some neurologic changes being worked up at this time. Reviewed with pharmacist Arrie Senate and Dr. Kathie Dike. Will hold off on giving vaccine at this time. Patient notified and verbalized understanding. ?

## 2021-06-08 NOTE — Progress Notes (Signed)
Occupational Therapy Treatment ?Patient Details ?Name: Raymond [name] ?MRN: [id number] ?DOB: [birth year] ?Today's Date: 06/08/2021 ? ? ?History of present illness 61 yo admitted 3/23 with chest pain. 3/24 pt s/p cardiac cath with coffee ground emesis and respiratory failure. 3/29 EGD. 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  ?OT comments ? Patient seen for second time to date to work on self feeding with built up handles. OT attempting session x2, however patient had not been brought a lunch tray therefore lunch ordered and patient practicing with pudding. Patient able to self feed with min A, bringing spoon to his mouth 70% of the time. Patient would benefit from dycem due to difficulty coordinating bimanual tasks. Continue to recommend AIR to promote increased level of function and independence.   ? ?Recommendations for follow up therapy are one component of a multi-disciplinary discharge planning process, led by the attending physician.  Recommendations may be updated based on patient status, additional functional criteria and insurance authorization. ?   ?Follow Up Recommendations ? Acute inpatient rehab (3hours/day)  ?  ?Assistance Recommended at Discharge Frequent or constant Supervision/Assistance  ?Patient can return home with the following ? A lot of help with walking and/or transfers;Two people to help with bathing/dressing/bathroom;Assistance with cooking/housework;Assistance with feeding;Direct supervision/assist for medications management;Direct supervision/assist for financial management;Assist for transportation;Help with stairs or ramp for entrance ?  ?Equipment Recommendations ? Other (comment) (Defer to next venue)  ?  ?Recommendations for Other Services   ? ?  ?Precautions / Restrictions Precautions ?Precautions: Fall ?Precaution Comments: Watch O2, incontinent, ataxia bil UE and RLE ?Restrictions ?Weight Bearing Restrictions: No  ? ? ?  ? ?Mobility  Bed Mobility ?Overal bed mobility: Needs Assistance ?Bed Mobility: Sit to Supine ?  ?  ?Supine to sit: Min assist, HOB elevated ?Sit to supine: Mod assist ?  ?General bed mobility comments: required mod A to bring BLEs back into bed ?  ? ?Transfers ?Overall transfer level: Needs assistance ?Equipment used: 2 person hand held assist ?Transfers: Sit to/from Stand ?Sit to Stand: Mod assist ?Stand pivot transfers: Mod assist, +2 safety/equipment, +2 physical assistance ?  ?Step pivot transfers: Mod assist, +2 physical assistance, +2 safety/equipment ?  ?  ?General transfer comment: patient fatigued after sitting in the chair, requiring increased assist to complete stand pivot to bed, assist also provided at bilateral knees to scoot back into the bed ?  ?  ?Balance Overall balance assessment: Needs assistance ?Sitting-balance support: No upper extremity supported, Feet supported ?Sitting balance-Leahy Scale: Fair ?Sitting balance - Comments: static sitting with guarding for safety ?  ?Standing balance support: No upper extremity supported ?Standing balance-Leahy Scale: Poor ?Standing balance comment: bil knees blocked and physical assist with belt to maintain standing ?  ?  ?  ?  ?  ?  ?  ?  ?  ?  ?  ?   ? ?ADL either performed or assessed with clinical judgement  ? ?ADL Overall ADL's : Needs assistance/impaired ?Eating/Feeding: Moderate assistance;Sitting;Bed level ?Eating/Feeding Details (indicate cue type and reason): Patient provided built up handle to self feed, successful 70% of the time with min A required for the remainder ?Grooming: Minimal assistance;Cueing for safety;Cueing for sequencing;Sitting ?  ?  ?  ?  ?  ?  ?  ?  ?  ?Toilet Transfer: Stand-pivot;Moderate assistance;Cueing for sequencing;Cueing for safety ?  ?  ?  ?  ?  ?Functional mobility during ADLs:  Moderate assistance;Cueing for sequencing;Cueing for safety ?General ADL Comments: Patient seen BID due to need for built up handles to promote self  feeding ?  ? ?Extremity/Trunk Assessment   ?  ?  ?  ?  ?  ? ?Vision   ?  ?  ?Perception   ?  ?Praxis   ?  ? ?Cognition Arousal/Alertness: Awake/alert ?Behavior During Therapy: [facility] for tasks assessed/performed ?Overall Cognitive Status: Within Functional Limits for tasks assessed ?Area of Impairment: Safety/judgement, Problem solving ?  ?  ?  ?  ?  ?  ?  ?  ?  ?  ?  ?  ?Safety/Judgement: Decreased awareness of safety, Decreased awareness of deficits ?  ?Problem Solving: Slow processing, Requires verbal cues, Requires tactile cues ?General Comments: pleasant with pt with wet back pack and linens in bed with pt unaware. pt aware of difficulty with ataxia but cannot plan or problem solve how to function without direct cues ?  ?  ?   ?Exercises   ? ?  ?Shoulder Instructions   ? ? ?  ?General Comments    ? ? ?Pertinent Vitals/ Pain       Pain Assessment ?Pain Assessment: No/denies pain ? ?Home Living   ?  ?  ?  ?  ?  ?  ?  ?  ?  ?  ?  ?  ?  ?  ?  ?  ?  ?  ? ?  ?Prior Functioning/Environment    ?  ?  ?  ?   ? ?Frequency ? Min 2X/week  ? ? ? ? ?  ?Progress Toward Goals ? ?OT Goals(current goals can now be found in the care plan section) ? Progress towards OT goals: Progressing toward goals ? ?Acute Rehab OT Goals ?Patient Stated Goal: to work on feeding myself ?OT Goal Formulation: With patient ?Time For Goal Achievement: 06/15/21 ?Potential to Achieve Goals: Fair  ?Plan Discharge plan remains appropriate   ? ?Co-evaluation ? ? ? PT/OT/SLP Co-Evaluation/Treatment: Yes ?Reason for Co-Treatment: Necessary to address cognition/behavior during functional activity;To address functional/ADL transfers;For patient/therapist safety ?  ?OT goals addressed during session: ADL's and self-care;Proper use of Adaptive equipment and DME ?  ? ?  ?AM-PAC OT "6 Clicks" Daily Activity     ?Outcome Measure ? ? Help from another person eating meals?: A Little ?Help from another person taking care of personal grooming?: A Little ?Help from  another person toileting, which includes using toliet, bedpan, or urinal?: A Lot ?Help from another person bathing (including washing, rinsing, drying)?: A Lot ?Help from another person to put on and taking off regular upper body clothing?: A Little ?Help from another person to put on and taking off regular lower body clothing?: A Lot ?6 Click Score: 15 ? ?  ?End of Session Equipment Utilized During Treatment: Gait belt;Oxygen ? ?OT Visit Diagnosis: Unsteadiness on feet (R26.81);Other abnormalities of gait and mobility (R26.89);Muscle weakness (generalized) (M62.81);Pain;Adult, failure to thrive (R62.7) ?  ?Activity Tolerance Patient limited by fatigue ?  ?Patient Left in bed;with call bell/phone within reach;with bed alarm set ?  ?Nurse Communication Mobility status ?  ? ?   ? ?Time: 0973-5329 ?OT Time Calculation (min): 29 min ? ?Charges: OT General Charges ?$OT Visit: 1 Visit ?OT Treatments ?$Self Care/Home Management : 23-37 mins ? ?Raymond [name], OTR/L ?Acute Rehabilitation Services ?[phone] ?[phone]  ? ?Raymond [name] ?06/08/2021, 3:21 PM ?

## 2021-06-08 NOTE — TOC Progression Note (Addendum)
Transition of Care (TOC) - Progression Note  ? ? ?Patient Details  ?Name: Raymond [name] ?MRN: [id number] ?Date of Birth: [birth year] ? ?Transition of Care (TOC) CM/SW Contact  ?Milas Gain, LCSWA ?Phone Number: ?06/08/2021, 3:20 PM ? ?Clinical Narrative:    ? ?Patient has current recommendation for CIR. CSW informed Bryson Ha with [employer]. Weekend Education officer, museum will follow up with Bryson Ha tomorrow on if CIR is confirmed plan for patient after CIR has a chance to speak with patient.CSW will continue to follow and assist with patients dc planning needs. ? ?Expected Discharge Plan: [place] ?Barriers to Discharge: Continued Medical Work up ? ?Expected Discharge Plan and Services ?Expected Discharge Plan: [place] ?In-house Referral: Clinical Social Work ?  ?  ?Living arrangements for the past 2 months: [place] ?                ?  ?  ?  ?  ?  ?  ?  ?  ?  ?  ? ? ?Social Determinants of Health (SDOH) Interventions ?  ? ?Readmission Risk Interventions ? ?  12/13/2020  ?  2:02 PM  ?Readmission Risk Prevention Plan  ?Transportation Screening Complete  ?PCP or Specialist Appt within 5-7 Days Complete  ?Home Care Screening Complete  ?Medication Review (RN CM) Complete  ? ? ?

## 2021-06-08 NOTE — Progress Notes (Signed)
Occupational Therapy Treatment ?Patient Details ?Name: Raymond [name] ?MRN: [id number] ?DOB: February 21, 1961 ?Today's Date: 06/08/2021 ? ? ?History of present illness 61 yo admitted 3/23 with chest pain. 3/24 pt s/p cardiac cath with coffee ground emesis and respiratory failure. 3/29 EGD. 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  ?OT comments ? Patient seen in conjunction with PT in order to progress towards goals. Patient remains unaware of soiled linens (urine) and benefits from step by step commands in order to motor plan. Patient continues to demonstrate ataxia with self care activities, and would benefit from built up handles for self feeding. Currently patient is unable to self feed (OT bringing handles later to work on self feeding). Patient completed x4 sit-stand from EOB with x4 partial squat and return to upright standing. Patient relies on bilateral blocking of BLE and modA at trunk, but was able to activate at hips and knees to improve standing position. ModA of 2 to take small steps to R to pivot to chair, good initiation but mod-maxA to maintain balance. Recommendation updated to CIR due to multi-faceted deficits, and would greatly benefit from strategies to aide with ataxic movements. OT will continue to follow.   ? ?Recommendations for follow up therapy are one component of a multi-disciplinary discharge planning process, led by the attending physician.  Recommendations may be updated based on patient status, additional functional criteria and insurance authorization. ?   ?Follow Up Recommendations ? Acute inpatient rehab (3hours/day)  ?  ?Assistance Recommended at Discharge Frequent or constant Supervision/Assistance  ?Patient can return home with the following ? A lot of help with walking and/or transfers;Two people to help with bathing/dressing/bathroom;Assistance with cooking/housework;Assistance with feeding;Direct supervision/assist for medications  management;Direct supervision/assist for financial management;Assist for transportation;Help with stairs or ramp for entrance ?  ?Equipment Recommendations ? Other (comment) (Defer to next venue)  ?  ?Recommendations for Other Services   ? ?  ?Precautions / Restrictions Precautions ?Precautions: Fall ?Precaution Comments: Watch O2, incontinent, ataxia bil UE and RLE ?Restrictions ?Weight Bearing Restrictions: No  ? ? ?  ? ?Mobility Bed Mobility ?Overal bed mobility: Needs Assistance ?Bed Mobility: Supine to Sit ?  ?  ?Supine to sit: Min assist, HOB elevated ?  ?  ?General bed mobility comments: minA to complete transition, poor ability to generate movement with UE ?  ? ?Transfers ?Overall transfer level: Needs assistance ?Equipment used: 1 person hand held assist, 2 person hand held assist ?Transfers: Sit to/from Stand ?Sit to Stand: Mod assist ?  ?  ?Step pivot transfers: Mod assist, +2 physical assistance, +2 safety/equipment ?  ?  ?General transfer comment: pt completed x4 sit-stand from EOB with x4 partial squat and return to upright standing. relies on bilateral blocking of BLE and modA at trunk, but was able to activate at hips and knees to improve standing position. ModA of 2 to take small steps to R to pivot to chair, good initiation but mod-maxA to maintain balance ?  ?  ?Balance Overall balance assessment: Needs assistance ?Sitting-balance support: No upper extremity supported, Feet supported ?Sitting balance-Leahy Scale: Fair ?Sitting balance - Comments: static sitting with guarding for safety ?  ?Standing balance support: No upper extremity supported ?Standing balance-Leahy Scale: Poor ?Standing balance comment: bil knees blocked and physical assist with belt to maintain standing ?  ?  ?  ?  ?  ?  ?  ?  ?  ?  ?  ?   ? ?  ADL either performed or assessed with clinical judgement  ? ?ADL Overall ADL's : Needs assistance/impaired ?  ?  ?Grooming: Minimal assistance;Cueing for safety;Cueing for  sequencing;Sitting ?  ?  ?  ?  ?  ?  ?  ?  ?  ?Toilet Transfer: Stand-pivot;Moderate assistance;Cueing for sequencing;Cueing for safety ?  ?  ?  ?  ?  ?Functional mobility during ADLs: Moderate assistance;Cueing for sequencing;Cueing for safety ?General ADL Comments: focus on stand pivots, self care activities, and continued strateiges for ataxia and coordination ?  ? ?Extremity/Trunk Assessment   ?  ?  ?  ?  ?  ? ?Vision   ?  ?  ?Perception   ?  ?Praxis   ?  ? ?Cognition Arousal/Alertness: Awake/alert ?Behavior During Therapy: [facility] for tasks assessed/performed ?Overall Cognitive Status: Impaired/Different from baseline ?Area of Impairment: Safety/judgement, Problem solving ?  ?  ?  ?  ?  ?  ?  ?  ?  ?  ?  ?  ?Safety/Judgement: Decreased awareness of safety, Decreased awareness of deficits ?  ?Problem Solving: Slow processing, Requires verbal cues, Requires tactile cues ?General Comments: pleasant with pt with wet back pack and linens in bed with pt unaware. pt aware of difficulty with ataxia but cannot plan or problem solve how to function without direct cues ?  ?  ?   ?Exercises   ? ?  ?Shoulder Instructions   ? ? ?  ?General Comments    ? ? ?Pertinent Vitals/ Pain       Pain Assessment ?Pain Assessment: No/denies pain ? ?Home Living   ?  ?  ?  ?  ?  ?  ?  ?  ?  ?  ?  ?  ?  ?  ?  ?  ?  ?  ? ?  ?Prior Functioning/Environment    ?  ?  ?  ?   ? ?Frequency ? Min 2X/week  ? ? ? ? ?  ?Progress Toward Goals ? ?OT Goals(current goals can now be found in the care plan section) ? Progress towards OT goals: Progressing toward goals ? ?Acute Rehab OT Goals ?Patient Stated Goal: to feed myself ?OT Goal Formulation: With patient ?Time For Goal Achievement: 06/15/21 ?Potential to Achieve Goals: Fair  ?Plan Discharge plan needs to be updated   ? ?Co-evaluation ? ? ? PT/OT/SLP Co-Evaluation/Treatment: Yes ?Reason for Co-Treatment: Necessary to address cognition/behavior during functional activity;To address functional/ADL  transfers;For patient/therapist safety ?  ?OT goals addressed during session: ADL's and self-care;Proper use of Adaptive equipment and DME ?  ? ?  ?AM-PAC OT "6 Clicks" Daily Activity     ?Outcome Measure ? ? Help from another person eating meals?: A Lot ?Help from another person taking care of personal grooming?: A Little ?Help from another person toileting, which includes using toliet, bedpan, or urinal?: A Lot ?Help from another person bathing (including washing, rinsing, drying)?: A Lot ?Help from another person to put on and taking off regular upper body clothing?: A Little ?Help from another person to put on and taking off regular lower body clothing?: A Lot ?6 Click Score: 14 ? ?  ?End of Session Equipment Utilized During Treatment: Gait belt;Oxygen ? ?OT Visit Diagnosis: Unsteadiness on feet (R26.81);Other abnormalities of gait and mobility (R26.89);Muscle weakness (generalized) (M62.81);Pain;Adult, failure to thrive (R62.7) ?  ?Activity Tolerance Patient tolerated treatment well ?  ?Patient Left in chair;with call bell/phone within reach;with chair alarm set ?  ?Nurse Communication Mobility status ?  ? ?   ? ?  Time: [phone] ?OT Time Calculation (min): 36 min ? ?Charges: OT General Charges ?$OT Visit: 1 Visit ?OT Treatments ?$Self Care/Home Management : 8-22 mins ? ?Corinne Ports E. Joli Koob, OTR/L ?Acute Rehabilitation Services ?[phone] ?[phone]  ? ?Corinne Ports Lucresha Dismuke ?06/08/2021, 1:04 PM ?

## 2021-06-08 NOTE — Care Management Important Message (Signed)
Important Message ? ?Patient Details  ?Name: Raymond [name] ?MRN: [id number] ?Date of Birth: Mar 31, 1960 ? ? ?Medicare Important Message Given:  Yes ? ? ? ? ?Shelda Altes ?06/08/2021, 9:26 AM ?

## 2021-06-09 ENCOUNTER — Inpatient Hospital Stay (HOSPITAL_COMMUNITY): Payer: Medicare HMO

## 2021-06-09 DIAGNOSIS — D62 Acute posthemorrhagic anemia: Secondary | ICD-10-CM | POA: Diagnosis not present

## 2021-06-09 DIAGNOSIS — D509 Iron deficiency anemia, unspecified: Secondary | ICD-10-CM | POA: Diagnosis not present

## 2021-06-09 DIAGNOSIS — T8789 Other complications of amputation stump: Secondary | ICD-10-CM

## 2021-06-09 DIAGNOSIS — J441 Chronic obstructive pulmonary disease with (acute) exacerbation: Secondary | ICD-10-CM | POA: Diagnosis not present

## 2021-06-09 DIAGNOSIS — M4802 Spinal stenosis, cervical region: Secondary | ICD-10-CM | POA: Diagnosis present

## 2021-06-09 DIAGNOSIS — J9621 Acute and chronic respiratory failure with hypoxia: Secondary | ICD-10-CM | POA: Diagnosis not present

## 2021-06-09 DIAGNOSIS — K250 Acute gastric ulcer with hemorrhage: Secondary | ICD-10-CM | POA: Diagnosis not present

## 2021-06-09 LAB — CBC
HCT: 28.7 % — ABNORMAL LOW (ref 39.0–52.0)
Hemoglobin: 8.9 g/dL — ABNORMAL LOW (ref 13.0–17.0)
MCH: 24.3 pg — ABNORMAL LOW (ref 26.0–34.0)
MCHC: 31 g/dL (ref 30.0–36.0)
MCV: 78.4 fL — ABNORMAL LOW (ref 80.0–100.0)
Platelets: 327 10*3/uL (ref 150–400)
RBC: 3.66 MIL/uL — ABNORMAL LOW (ref 4.22–5.81)
RDW: 23.7 % — ABNORMAL HIGH (ref 11.5–15.5)
WBC: 19.9 10*3/uL — ABNORMAL HIGH (ref 4.0–10.5)
nRBC: 0 % (ref 0.0–0.2)

## 2021-06-09 LAB — RENAL FUNCTION PANEL
Albumin: 1.8 g/dL — ABNORMAL LOW (ref 3.5–5.0)
Anion gap: 9 (ref 5–15)
BUN: 11 mg/dL (ref 6–20)
CO2: 28 mmol/L (ref 22–32)
Calcium: 8.1 mg/dL — ABNORMAL LOW (ref 8.9–10.3)
Chloride: 97 mmol/L — ABNORMAL LOW (ref 98–111)
Creatinine, Ser: 0.68 mg/dL (ref 0.61–1.24)
GFR, Estimated: >60 mL/min (ref >60)
Glucose, Bld: 111 mg/dL — ABNORMAL HIGH (ref 70–99)
Phosphorus: 3.6 mg/dL (ref 2.5–4.6)
Potassium: 3.6 mmol/L (ref 3.5–5.1)
Sodium: 134 mmol/L — ABNORMAL LOW (ref 135–145)

## 2021-06-09 LAB — GLUCOSE, CAPILLARY
Glucose-Capillary: 107 mg/dL — ABNORMAL HIGH (ref 70–99)
Glucose-Capillary: 75 mg/dL (ref 70–99)
Glucose-Capillary: 132 mg/dL — ABNORMAL HIGH (ref 70–99)
Glucose-Capillary: 205 mg/dL — ABNORMAL HIGH (ref 70–99)

## 2021-06-09 MED ORDER — GADOBUTROL 1 MMOL/ML IV SOLN
6.0000 mL | Freq: Once | INTRAVENOUS | Status: AC | PRN
  Administered 2021-06-09: 6 mL via INTRAVENOUS

## 2021-06-09 NOTE — Consult Note (Addendum)
NEUROLOGY CONSULTATION NOTE  ? ?Date of service: June 09, 2021 ?Patient Name: Raymond [name] ?MRN:  [id number] ?DOB:  Mar 10, 1961 ?Reason for consult: "BL UE ataxia and weakness" ?Requesting Provider: Kathie Dike, MD ?_ _ _   _ __   _ __ _ _  __ __   _ __   __ _ ? ?History of Present Illness  ?Raymond [name] is a 61 y.o. male with PMH significant for  CAD SP PCI, COPD, chronic respiratory failure, GERD, active smoker, PVD, type II DM. He initially presented with chest pain and worked up for elevated troponin with cardiac cath which was notable for non obstructive CAD. Hospitalization complicated by GI bleed requiring ICU admission. Workup revealed large cratered ulcer in distal third of stomach. ? ?He was noted to have poor coordination in BL upper extremities along with weakness which prompted this neurology consult. ? ?Patient reports fall in a cast iron bath tub about a year ago and immediately afterwards his hands were clumsy. Had to really think to get his hands to work. This improved after a week but about 5 months ago, this slowly started coming back and has worsened over the last few months to the point that he cant do anything with his hands now.  ? ?Also reports that a few months ago, he accidentally stepped on something with his R foot and ended up having infection which required amputation of the toe, required stent placement in the arteries in his legs. ? ?He reports shock sensation running down his spine when he bends his nack, endorses hearing cracks in his neck every time he moves his neck. Endorses saddle anesthesia. He can feel a little bit when his bladder is full but unable to hold urine for more than a minute of 2 and immediately has to go the bathroom and when he urinates, he is unable to tell when his bladder is completely empty, it just stops and that's how he knows that he is done urinating. He endorses constipation. ? ?He had workup here with MRI brain w/o contrast which demonstrated an  incidental R frontal white matter stroke. ? ?ROS  ? ?Constitutional Denies weight loss, fever and chills.   ?HEENT Denies changes in vision and hearing.   ?Respiratory Denies SOB and cough.   ?CV Denies palpitations and CP   ?GI Denies abdominal pain, nausea, vomiting and diarrhea.   ?GU + for dysuria and urinary frequency.   ?MSK + myalgia and joint pain.   ?Skin Denies rash and pruritus.   ?Neurological Denies headache and syncope.   ?Psychiatric Denies recent changes in mood. Denies anxiety and depression.   ? ?Past History  ? ?Past Medical History:  ?Diagnosis Date  ? Anxiety   ? Back pain   ? Bipolar disorder ([place])   ? Borderline hypertension   ? Chronic pain   ? COPD (chronic obstructive pulmonary disease) ([place])   ? Depression with anxiety   ? GERD (gastroesophageal reflux disease)   ? Headache   ? Hip pain   ? Hypercholesterolemia   ? 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 Stroke ([place]) 11/10/2013  ? Tobacco use   ? ?Past Surgical History:  ?Procedure Laterality Date  ? AMPUTATION Right 12/08/2020  ? Procedure: RAY AMPUTATION OF RIGHT FIFTH TOE;  Surgeon: Angelia Mould, MD;  Location: [facility] OR;  Service: Vascular;  Laterality: Right;  ? ANGIOPLASTY ILLIAC ARTERY Right 12/08/2020  ? Procedure: ILIAC ANGIOPLASTY WITH INSERTION OF RIGHT COMMON  ILIAC 8MM X 79MM VIABAHN STENT;  Surgeon: Angelia Mould, MD;  Location: [facility] OR;  Service: Vascular;  Laterality: Right;  ? ANTERIOR FUSION CERVICAL SPINE    ? BACK SURGERY    ? BIOPSY  06/06/2021  ? Procedure: BIOPSY;  Surgeon: Juanita Craver, MD;  Location: [facility] ENDOSCOPY;  Service: Gastroenterology;;  ? CHOLECYSTECTOMY    ? COLONOSCOPY N/A 10/28/2012  ? ZSW:FUXNAT polyp-hyperplastic. Internal hemorrhoids. Distal 5 cm of terminal ileum appeared normal.  ? CORONARY ANGIOPLASTY WITH STENT PLACEMENT  1980s  ? ENDARTERECTOMY FEMORAL Right 12/08/2020  ? Procedure: RIGHT FEMORAL ENDARTERECTOMY WITH ILIOFEMORAL THROMBECTOMY;  Surgeon: Angelia Mould, MD;  Location: [facility] OR;  Service: Vascular;  Laterality: Right;  ? ESOPHAGOGASTRODUODENOSCOPY N/A 10/28/2012  ? FTD:DUKGURKY gastric mucosa with mottling and submucosal petechiae. Mild chronic gastritis but no H. pylori home path. Small hiatal hernia. Duodenal lipoma  ? ESOPHAGOGASTRODUODENOSCOPY (EGD) WITH PROPOFOL N/A 06/06/2021  ? Procedure: ESOPHAGOGASTRODUODENOSCOPY (EGD) WITH PROPOFOL;  Surgeon: Juanita Craver, MD;  Location: [facility] ENDOSCOPY;  Service: Gastroenterology;  Laterality: N/A;  ? EYE SURGERY  Left eye  ? as a child  ? FEMORAL-POPLITEAL BYPASS GRAFT Right 12/08/2020  ? Procedure: RIGHT FEMORAL-BELOW KNEE POPLITEAL ARTERY BYPASS GRAFT;  Surgeon: Angelia Mould, MD;  Location: [place];  Service: Vascular;  Laterality: Right;  ? LEFT HEART CATH AND CORONARY ANGIOGRAPHY N/A 06/01/2021  ? Procedure: LEFT HEART CATH AND CORONARY ANGIOGRAPHY;  Surgeon: Burnell Blanks, MD;  Location: [place] [place] CV LAB;  Service: Cardiovascular;  Laterality: N/A;  ? LUMBAR SPINE SURGERY    ? 3 x   ? ?Family History  ?Problem Relation Age of Onset  ? Stomach cancer Mother   ? Throat cancer Father   ? Colon cancer Neg Hx   ? ?Social History  ? ?Socioeconomic History  ? Marital status: Married  ?  Spouse name: Not on file  ? Number of children: 4  ? Years of education: 59  ? Highest education level: Not on file  ?Occupational History  ? Occupation: Disabled  ?Tobacco Use  ? Smoking status: Every Day  ?  Packs/day: 1.50  ?  Years: 30.00  ?  Pack years: 45.00  ?  Types: Cigarettes  ? Smokeless tobacco: Never  ?Vaping Use  ? Vaping Use: Never used  ?Substance and Sexual Activity  ? Alcohol use: No  ?  Alcohol/week: 0.0 standard drinks  ?  Comment: history of ETOH abuse in past, none in 4 years  ? Drug use: No  ? Sexual activity: Not Currently  ?Other Topics Concern  ? Not on file  ?Social History Narrative  ? Lives at home alone.  ? 12 pack of [place] Dew per day.  ? Right-handed.  ? ?Social Determinants of Health   ? ?Financial Resource Strain: Not on file  ?Food Insecurity: Not on file  ?Transportation Needs: Not on file  ?Physical Activity: Not on file  ?Stress: Not on file  ?Social Connections: Not on file  ? ?Allergies  ?Allergen Reactions  ? Morphine And Related Shortness Of Breath and Palpitations  ? Penicillins Anaphylaxis  ? Vicodin [Hydrocodone-Acetaminophen] Shortness Of Breath and Palpitations  ? Latex Hives and Rash  ? ? ?Medications  ? ?Medications Prior to Admission  ?Medication Sig Dispense Refill Last Dose  ? albuterol (ACCUNEB) 0.63 MG/3ML nebulizer solution Take 1 ampule by nebulization every 6 (six) hours as needed for wheezing or shortness of breath.   05/30/2021  ? albuterol (PROVENTIL HFA;VENTOLIN HFA) 108 (  90 BASE) MCG/ACT inhaler Inhale 2 puffs into the lungs every 6 (six) hours as needed for wheezing.   05/31/2021  ? allopurinol (ZYLOPRIM) 100 MG tablet Take 100 mg by mouth in the morning.   05/30/2021  ? aspirin 81 MG tablet Take 81 mg by mouth daily.   05/31/2021  ? buPROPion (WELLBUTRIN XL) 150 MG 24 hr tablet Take 1 tablet by mouth daily at 6 (six) AM.   05/30/2021  ? calcium carbonate (TUMS EX) 750 MG chewable tablet Chew 2 tablets by mouth as needed for heartburn.   Past Week  ? clopidogrel (PLAVIX) 75 MG tablet Take 1 tablet (75 mg total) by mouth daily at 6 (six) AM. 30 tablet 2 05/30/2021  ? gabapentin (NEURONTIN) 300 MG capsule Take 300 mg by mouth 3 (three) times daily.   05/30/2021  ? Magnesium Hydroxide (DULCOLAX SOFT CHEWS) 1200 MG CHEW Chew 1 tablet by mouth as needed (constipation).   Past Week  ? magnesium oxide (MAG-OX) 400 MG tablet Take 1 tablet by mouth daily.   05/30/2021  ? metFORMIN (GLUCOPHAGE) 500 MG tablet Take 2 tablets by mouth 2 (two) times daily.   05/30/2021  ? metoprolol tartrate (LOPRESSOR) 25 MG tablet TAKE 1/2 TABLET BY MOUTH TWICE DAILY Needs appointment for further refills (Patient taking differently: Take 12.5 mg by mouth 2 (two) times daily.) 15 tablet 0 05/30/2021 at  1200  ? omeprazole (PRILOSEC) 40 MG capsule Take 1 capsule by mouth daily at 6 (six) AM.   05/30/2021  ? ondansetron (ZOFRAN) 4 MG tablet Take 4 mg by mouth 3 (three) times daily as needed.   Past Week  ? rosuvastatin (

## 2021-06-09 NOTE — Progress Notes (Signed)
? ? ? [facility] ?                                                                            ? ? ?Raymond [name], is a 61 y.o. male, DOB - 02-23-1961, IEP:[id number] ?Admit date - 05/31/2021    ?Outpatient Primary MD for the patient is [name], MD ? ?LOS - 8  days ? ? ? ?Brief summary  ? ?Past medical history of CAD SP PCI, COPD, chronic respiratory failure, GERD, active smoker, PVD, type II DM.  Presents with complaints of chest pain and elevated troponin as well as COPD exacerbation. ?Cardiology was consulted.  Underwent cardiac catheterization on 3/24 which showed nonobstructive CAD.  Medical management recommended. Patient developed Malena, associated with hypotension and tachycardia.  ?GI consulted and he was started on PPI GTT and transferred to ICU for hypotension and possible pressors.  ?Pt did not require vasopressors and he was transferred to [facility] on 06/05/21.  ?Patient underwent EGD on 3/29 that showed large cratered ulcer in distal third of stomach ?Hemoglobin has been stable and respiratory status appears to be at baseline ?He complained of weakness and ataxia, MRI C spine shows severe spinal stenosis with mass effect on cord ?Neurology and Neurosurgery consulted ? ? ?Assessment & Plan  ? ? ?Assessment and Plan: ? ? ? ?GI bleed.  ?Likely upper GI bleed, ?EGD on 3/29 showed large cratered ulcer in distal third of stomach ?Continue PPI ?Discussed with GI and recommended to continue to hold aspirin and plavix for another 7 days (until 06/14/2021) ?Continue to follow hemoglobin ? ?Acute blood loss anemia ?-Secondary to GI bleeding ?-Status post 2 units PRBC ?-Continue to follow hemoglobin ? ?Retained gastric contents ?-May have component of gastroparesis since he is on chronic opiates ?-Appears to be tolerating Reglan ? ?Esophageal candidiasis ?-Started on Diflucan ? 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 He also endorses intermittent urinary incontience ?-Currently, patient has difficulty feeding himself/drinking out of a cup ?-He has difficulty walking with a walker since he is unable to support his weight on his arms ?-Etiology of symptoms is not entirely clear ?-MRI brain shows normal volume of cerebellum, but does comment on small acute infarct in posterior right frontal lobe (suspect incidental) ?-B12 360 ?-Neurology consulted and ordered MRI C/T spine which showed severe spinal stenosis with mass effect on cord ?-Neurosurgery consulted ?-PT/OT following, now recommending CIR ? ?Acute cerebral infarct ?-incidental finding on MRI brain ?-Neurology following ?-MRA head unremarkable ?-carotid dopplers without any significant stenosis bilaterally ? ?* Acute and chronic respiratory failure with hypoxia ([place]) ?COPD with acute exacerbation secondary to community-acquired pneumonia. ?Uses oxygen as needed at home ?Completed the course of doxycycline and currently on prednisone taper ?Continue with bronchodilators .  ? ?NSTEMI (non-ST elevated myocardial infarction) ([place]) ?HLD. ?HTN. ?Presented with complaints of chest tightness. ?Troponins were minimally elevated patient was started on IV heparin drip.  Cardiology considers this as non-STEMI. ?Underwent cardiac catheterization which shows nonobstructive CAD. ?Echocardiogram  performed, EF 55 to 60%. ?Continue with crestor, and anti platelet agents are on hold for GI bleed.  ?Cardiology currently signed off. ?Outpatient follow-up. ? ?Right hydronephrosis ?-Appears to be incidental finding on CT ?-Could not visualize distal ureter due to artifact from right hip arthroplasty ?-Renal ultrasound shows moderate right-sided hydronephrosis without any obvious stones ?-Renal function currently normal and he is otherwise asymptomatic ?-No  signs of UTI or fever ?-Discussed with urology, Dr. Abner Greenspan who felt it would be reasonable to have patient follow-up with urology in the next couple weeks with repeat CT abdomen with renal stone protocol ? ?Depression ?We will continue Wellbutrin XL. ? ?Chronic degenerative disc disease ?Reports having surgery on the lumbar spine as well as cervical spine in the past (between 10 to 20 years ago) ?Imaging indicates ACDF as well as lumbar fusion in the past ?His neurosurgeon, Dr. Carloyn Manner is now retired ? ? ?Delayed surgical wound healing of toe amputation stump ([place]) ?Appreciate wound care consultation.  Monitor. ? ?Protein-calorie malnutrition, severe ?Body mass index is 20.94 kg/m?[name] ?Nutrition Problem: Severe Malnutrition (in the context of chronic illness) ?Etiology: poor appetite ?Nutrition Interventions: ?Interventions: Refer to RD note for recommendations  ? ?Pressure injury of skin ?Present on admission. ?Left lower buttock. ?Wound care consulted. ?For right toe ulcer, cleanse right lateral foot wound with saline, pat dry. Place a small piece of Xeroform gauze over the wound, then dry gauze, secure with a few turns of kerlix. ?  ?For the left buttock/hip wound, cleanse with saline, place a small piece of xeroform gauze over the wound, and cover with a foam dressing. Change both every 2 days. ? ? ?Tobacco use ?Continue nicotine patch. ?Patient was counseled on tobacco cessation. ? ? ?Leukocytosis:  ?Possibly from prednisone.  ? ?  ? ? ?RN Pressure Injury Documentation: ?Pressure Injury 05/31/21 Sacrum Medial Stage 1 -  Intact skin with non-blanchable redness of a localized area usually over a bony prominence. (Active)  ?05/31/21 2100  ?Location: Sacrum  ?Location Orientation: Medial  ?Staging: Stage 1 -  Intact skin with non-blanchable redness of a localized area usually over a bony prominence.  ?Wound Description (Comments):   ?Present on Admission: Yes  ?Dressing Type Foam - Lift dressing to assess site every shift  06/09/21 1147  ?   ?Pressure Injury 05/31/21 Ischial tuberosity Left Stage 2 -  Partial thickness loss of dermis presenting as a shallow open injury with a red, pink wound bed without slough. (Active)  ?05/31/21 2300  ?Location: Ischial tuberosity  ?Location Orientation: Left  ?Staging: Stage 2 -  Partial thickness loss of dermis presenting as a shallow open injury with a red, pink wound bed without slough.  ?Wound Description (Comments):   ?Present on Admission: Yes  ?Dressing Type Foam - Lift dressing to assess site every shift 06/09/21 1147  ? ? ?Malnutrition Type: ? ?Nutrition Problem: Severe Malnutrition (in the context of chronic illness) ?Etiology: poor appetite ? ? ?Malnutrition Characteristics: ? ?Signs/Symptoms: severe fat depletion, severe muscle depletion, percent weight loss (20.6% x 11.5 months) ?Percent weight loss: 20.6 % ? ? ?Nutrition Interventions: ? ?Interventions: Refer to RD note for recommendations ? ?Estimated body mass index is 21.86 kg/m? as calculated from the following: ?  Height as of this encounter: '5\' 7"'$  (1.702 m). ?  Weight as of this encounter: 63.3 kg. ? ?Code Status: full code.  ?DVT Prophylaxis:  Place and maintain sequential compression device Start: 06/05/21 1735 ? ? ?Level of Care: Level of care: Telemetry  Medical ?Family Communication: discussed with patient ? ?Disposition Plan:     Remains inpatient appropriate:  Awaiting SNF placement ? ?Procedures:  ?3/24: Cardiac cath: Nonobstructive coronary disease ?3/24 Echo: EF 55:60%, no WMA ?3/29 EGD: candidal esophagitis, large non-obstructing, cratered ulcer occupying distal 1/3 of stomach in pre-pyloric region with patchy areas of necrosis. Large amount of solid debris in stomach ? ?Consultants:   ? ?Gastroenterology ?PCCM.  ?Neurology ?Neurosurgery ? ?Antimicrobials:  ? ?Anti-infectives (From admission, onward)  ? ? Start     Dose/Rate Route Frequency Ordered Stop  ? 06/07/21 1000  fluconazole (DIFLUCAN) tablet 300 mg       ? 300  mg Oral Daily 06/06/21 1806 06/20/21 0959  ? 06/06/21 1900  fluconazole (DIFLUCAN) tablet 400 mg       ? 400 mg Oral  Once 06/06/21 1806 06/06/21 2134  ? 06/03/21 0000  doxycycline (VIBRA-TABS) 100 MG

## 2021-06-09 NOTE — Progress Notes (Signed)
VASCULAR LAB ? ? ? ?Carotid duplex has been performed. ? ?See CV proc for preliminary results. ? ? ?Sawsan Riggio, RVT ?06/09/2021, 9:21 AM ? ?

## 2021-06-09 NOTE — Assessment & Plan Note (Addendum)
History of lumbar spinal stenosis as well. ?Neurosurgery was consulted due to patient's complaints of weakness and for further work-up for acute stroke. ?-Noted to have severe spinal stenosis with mass effect on cord C2-C3 and C3-C4 ?-Neurosurgery consulted patient was recommended to undergo C2-C4 posterior cervical decompression and fusion. ?Perioperatively patient becomes severely hypotensive requiring cancellation of the surgery. ?Currently neurosurgery recommends outpatient surgery once the patient recovers from his acute medical issues. ?

## 2021-06-09 NOTE — Progress Notes (Signed)
? ? Progress Note ? ? Subjective  ?Chief Complaint: ? ?Denies N, V.  ?Has not had BM.  ?Continuing left sided chest pain, cardiology is following.  ?Being evaluated by neurology, getting carotid US and MRI head.  ? ? ? ? Objective  ? ?Vital signs in last 24 hours: ?Temp:  [97.9 ?F (36.6 ?C)-98.4 ?F (36.9 ?C)] 97.9 ?F (36.6 ?C) (04/01 0351) ?Pulse Rate:  [90-99] 99 (04/01 0351) ?Resp:  [11-16] 13 (04/01 0351) ?BP: (85-131)/(58-86) 126/86 (04/01 0351) ?SpO2:  [92 %-95 %] 95 % (04/01 0750) ?FiO2 (%):  [32 %] 32 % (03/31 1535) ?Last BM Date : 06/07/21 ? ?General:   male in no acute distress  ?Heart:  Regular rate and rhythm; no murmurs ?Pulm: Clear anteriorly; no wheezing ?Abdomen:  Soft, Obese AB, Active bowel sounds. No tenderness . , No organomegaly appreciated. ?Extremities:  without  edema. ?Neurologic:  Alert and  oriented x4;  No focal deficits.  ?Psych:  Cooperative. Normal mood and affect. ? ? ?Intake/Output from previous day: ?03/31 0701 - 04/01 0700 ?In: -  ?Out: 900 [Urine:900] ?Intake/Output this shift: ?Total I/O ?In: -  ?Out: 900 [Urine:900] ? ?Lab Results: ?Recent Labs  ?  06/07/21 ?0314 06/08/21 ?5102 06/09/21 ?0255  ?WBC 16.0* 20.5* 19.9*  ?HGB 8.1* 9.1* 8.9*  ?HCT 27.0* 29.2* 28.7*  ?PLT 324 401* 327  ? ?BMET ?Recent Labs  ?  06/07/21 ?0314 06/08/21 ?5852 06/09/21 ?0255  ?NA 136 134* 134*  ?K 3.3* 3.9 3.6  ?CL 101 99 97*  ?CO2 '30 28 28  '$ ?GLUCOSE 85 88 111*  ?BUN '13 15 11  '$ ?CREATININE 0.56* 0.61 0.68  ?CALCIUM 8.1* 8.1* 8.1*  ? ?LFT ?Recent Labs  ?  06/09/21 ?0255  ?ALBUMIN 1.8*  ? ?PT/INR ?No results for input(s): LABPROT, INR in the last 72 hours. ? ?Studies/Results: ?DG Chest 2 View ? ?Result Date: 06/08/2021 ?CLINICAL DATA:  Stroke EXAM: CHEST - 2 VIEW COMPARISON:  06/03/2021 FINDINGS: Heart size and pulmonary vascularity are normal. Small bilateral pleural effusions with evidence of perihilar infiltration may indicate pulmonary edema, new since prior study. No pneumothorax. Mediastinal contours  appear intact. Postoperative changes in the cervical spine. IMPRESSION: Perihilar infiltrates and small effusions.  New since prior study. Electronically Signed   By: Lucienne Capers M.D.   On: 06/08/2021 20:05  ? ?MR BRAIN W WO CONTRAST ? ?Result Date: 06/07/2021 ?CLINICAL DATA:  Progressive bilateral upper extremity and lower extremity ataxia EXAM: MRI HEAD WITHOUT AND WITH CONTRAST TECHNIQUE: Multiplanar, multiecho pulse sequences of the brain and surrounding structures were obtained without and with intravenous contrast. CONTRAST:  68m GADAVIST GADOBUTROL 1 MMOL/ML IV SOLN COMPARISON:  01/22/2009 MRI head, correlation is also made with CTA 05/05/2020 FINDINGS: Evaluation is somewhat limited by motion artifact. Brain: Small focus of restricted diffusion with ADC correlate in the posterior right frontal lobe (series 3, image 40 and series 300, image 40). No acute hemorrhage, mass, mass effect, or midline shift. No hydrocephalus or extra-axial collection. No parenchymal or meningeal enhancement. Minimal T2 hyperintense signal in the periventricular white matter, likely the sequela of chronic small vessel ischemic disease. Cerebral and cerebellar volumes are within normal limits. Vascular: Normal flow voids. Skull and upper cervical spine: Normal marrow signal. Sinuses/Orbits: Mucosal thickening in the left maxillary sinus and anterior ethmoid air cells. The orbits are unremarkable. Other: Fluid throughout the left mastoid air cells, with trace fluid in the right mastoid air cells. IMPRESSION: 1. Evaluation is somewhat limited by motion artifact. Within  this limitation, there is a small acute infarct in the posterior right frontal lobe. 2. No other acute intracranial process. Electronically Signed   By: Merilyn Baba M.D.   On: 06/07/2021 19:29  ? ?VAS US CAROTID ? ?Result Date: 06/09/2021 ?Carotid Arterial Duplex Study Patient Name:  Raymond [name]  Date of Exam:   06/09/2021 Medical Rec #: [id number]    Accession #:     [id number] Date of Birth: 10/04/1960    Patient Gender: M Patient Age:   61 years Exam Location:  [facility] Procedure:      VAS US CAROTID Referring Phys: Alferd Patee [facility] --------------------------------------------------------------------------------  Indications:       CVA. Risk Factors:      Hypertension, hyperlipidemia, current smoker. Limitations        Today's exam was limited due to the patient's respiratory                    variation. Comparison Study:  Prior carotid duplex done 04/10/20 indicated 1-39% ICA                    stenosis, bilaterally. Performing Technologist: Sharion Dove RVS  Examination Guidelines: A complete evaluation includes B-mode imaging, spectral Doppler, color Doppler, and power Doppler as needed of all accessible portions of each vessel. Bilateral testing is considered an integral part of a complete examination. Limited examinations for reoccurring indications may be performed as noted.  Right Carotid Findings: +----------+--------+--------+--------+------------------+------------------+           PSV cm/sEDV cm/sStenosisPlaque DescriptionComments           +----------+--------+--------+--------+------------------+------------------+ CCA Prox  118     24                                intimal thickening +----------+--------+--------+--------+------------------+------------------+ CCA Distal99      24                                intimal thickening +----------+--------+--------+--------+------------------+------------------+ ICA Prox  92      22      1-39%   heterogenous                         +----------+--------+--------+--------+------------------+------------------+ ICA Distal85      20                                                   +----------+--------+--------+--------+------------------+------------------+ ECA       88      12                                                    +----------+--------+--------+--------+------------------+------------------+ +----------+--------+-------+--------+-------------------+           PSV cm/sEDV cmsDescribeArm Pressure (mmHG) +----------+--------+-------+--------+-------------------+ [id number]                                         +----------+--------+-------+--------+-------------------+ +---------+--------+--+--------+-+ VertebralPSV cm/s63EDV cm/s3 +---------+--------+--+--------+-+  Left  Carotid Findings: +----------+--------+--------+--------+------------------+------------------+           PSV cm/sEDV cm/sStenosisPlaque DescriptionComments           +----------+--------+--------+--------+------------------+------------------+ CCA Prox  83      14                                intimal thickening +----------+--------+--------+--------+------------------+------------------+ CCA Distal83      13                                intimal thickening +----------+--------+--------+--------+------------------+------------------+ ICA Prox  121     23      1-39%   heterogenous                         +----------+--------+--------+--------+------------------+------------------+ ICA Distal102     20                                tortuous           +----------+--------+--------+--------+------------------+------------------+ ECA       187     5                                                    +----------+--------+--------+--------+------------------+------------------+ +----------+--------+--------+--------------+-------------------+           PSV cm/sEDV cm/sDescribe      Arm Pressure (mmHG) +----------+--------+--------+--------------+-------------------+ Subclavian                Not identified                    +----------+--------+--------+--------------+-------------------+ +---------+--------+--------+--------------------------------------------------+ VertebralPSV  cm/sEDV cm/sunable to insonate flow, possibly secondary to                              breathing/body habitus                             +---------+--------+--------+--------------------------------------------------+   Summary: Right Carotid: Velocities in the right ICA are consistent with a 1-39% stenosis. Left Carotid: There is no evidence of stenosis in the le

## 2021-06-09 NOTE — Assessment & Plan Note (Addendum)
Incidental finding on MRI brain of small acute infarct in right posterior frontal lobe ?Neurology consulted ?Continue statin.  LDL at goal.   ?Ideally patient would be on 81 mg aspirin and 75 mg Plavix for 21 days followed by 80 mg aspirin but currently on hold due to GI bleed. ?

## 2021-06-09 NOTE — Progress Notes (Signed)
Inpatient Rehab Admissions: ? ?Inpatient Rehab Consult received.  I met with patient at the bedside for rehabilitation assessment and to discuss goals and expectations of an inpatient rehab admission.  Pt acknowledged understanding of CIR goals and expectations. Pt would like to discuss CIR versus SNF with daughter. Will follow up with pt tomorrow or Monday. ? ?Signed: ?Gayland Curry, MS, CCC-SLP ?Admissions Coordinator ?[phone] ? ? ?

## 2021-06-10 DIAGNOSIS — J9621 Acute and chronic respiratory failure with hypoxia: Secondary | ICD-10-CM | POA: Diagnosis not present

## 2021-06-10 DIAGNOSIS — J441 Chronic obstructive pulmonary disease with (acute) exacerbation: Secondary | ICD-10-CM | POA: Diagnosis not present

## 2021-06-10 DIAGNOSIS — T8789 Other complications of amputation stump: Secondary | ICD-10-CM | POA: Diagnosis not present

## 2021-06-10 DIAGNOSIS — D62 Acute posthemorrhagic anemia: Secondary | ICD-10-CM | POA: Diagnosis not present

## 2021-06-10 DIAGNOSIS — K250 Acute gastric ulcer with hemorrhage: Secondary | ICD-10-CM | POA: Diagnosis not present

## 2021-06-10 LAB — COMPREHENSIVE METABOLIC PANEL
ALT: 25 U/L (ref 0–44)
AST: 19 U/L (ref 15–41)
Albumin: 1.6 g/dL — ABNORMAL LOW (ref 3.5–5.0)
Alkaline Phosphatase: 82 U/L (ref 38–126)
Anion gap: 5 (ref 5–15)
BUN: 11 mg/dL (ref 6–20)
CO2: 36 mmol/L — ABNORMAL HIGH (ref 22–32)
Calcium: 7.9 mg/dL — ABNORMAL LOW (ref 8.9–10.3)
Chloride: 95 mmol/L — ABNORMAL LOW (ref 98–111)
Creatinine, Ser: 0.69 mg/dL (ref 0.61–1.24)
GFR, Estimated: >60 mL/min (ref >60)
Glucose, Bld: 112 mg/dL — ABNORMAL HIGH (ref 70–99)
Potassium: 3.1 mmol/L — ABNORMAL LOW (ref 3.5–5.1)
Sodium: 136 mmol/L (ref 135–145)
Total Bilirubin: <0.1 mg/dL — ABNORMAL LOW (ref 0.3–1.2)
Total Protein: 4.3 g/dL — ABNORMAL LOW (ref 6.5–8.1)

## 2021-06-10 LAB — CBC
HCT: 27.3 % — ABNORMAL LOW (ref 39.0–52.0)
Hemoglobin: 8.3 g/dL — ABNORMAL LOW (ref 13.0–17.0)
MCH: 24.5 pg — ABNORMAL LOW (ref 26.0–34.0)
MCHC: 30.4 g/dL (ref 30.0–36.0)
MCV: 80.5 fL (ref 80.0–100.0)
Platelets: 374 10*3/uL (ref 150–400)
RBC: 3.39 MIL/uL — ABNORMAL LOW (ref 4.22–5.81)
RDW: 24.1 % — ABNORMAL HIGH (ref 11.5–15.5)
WBC: 14.8 10*3/uL — ABNORMAL HIGH (ref 4.0–10.5)
nRBC: 0 % (ref 0.0–0.2)

## 2021-06-10 LAB — GLUCOSE, CAPILLARY
Glucose-Capillary: 105 mg/dL — ABNORMAL HIGH (ref 70–99)
Glucose-Capillary: 339 mg/dL — ABNORMAL HIGH (ref 70–99)
Glucose-Capillary: 144 mg/dL — ABNORMAL HIGH (ref 70–99)
Glucose-Capillary: 94 mg/dL (ref 70–99)

## 2021-06-10 LAB — MAGNESIUM: Magnesium: 1.8 mg/dL (ref 1.7–2.4)

## 2021-06-10 MED ORDER — POTASSIUM CHLORIDE CRYS ER 20 MEQ PO TBCR
40.0000 meq | EXTENDED_RELEASE_TABLET | Freq: Once | ORAL | Status: AC
  Administered 2021-06-10: 40 meq via ORAL
  Filled 2021-06-10: qty 2

## 2021-06-10 MED ORDER — ALBUTEROL SULFATE (2.5 MG/3ML) 0.083% IN NEBU: 2.5000 mg | INHALATION_SOLUTION | Freq: Four times a day (QID) | RESPIRATORY_TRACT | Status: DC | PRN

## 2021-06-10 NOTE — Consult Note (Signed)
? ?  Providing Compassionate, Quality Care - Together ? ?Neurosurgery Consult  ?Referring physician: Dr. Roderic Palau ?Reason for referral: Cervical myelopathy ? ?Chief Complaint: Increased numbness, tingling in hands and difficulty with fine motor movement, inability to walk x5 weeks ? ?History of Present Illness: This is a 61 year old male with a history of CAD, COPD, PVD, diabetes, the complaint of chest pain and underwent cardiac cath was found to have nonobstructive coronary artery disease.  He also complains of significant worsening numbness, tingling, difficulty with fine motor movement in his hands that is been significantly progressing over the past 6 months.  He states he has been unable to walk over the past 5 weeks due to his balance difficulty and generalized weakness.  He does have a history of an anterior cervical decompression and fusion by Dr. Carloyn Manner many years ago C4-7.  MRI cervical spine was obtained given concerns for myelopathy. ? ?He also unfortunately had an GI bleed, has been off his aspirin and Plavix for greater than 1 week.  This appears to be stable. ? ?Medications: I have reviewed the patient's current medications. ?Allergies: No Known Allergies ? ?History reviewed. No pertinent family history. ?Social History:  has no history on file for tobacco use, alcohol use, and drug use. ? ?ROS: ?All pertinent positives and negatives are listed in HPI above. ? ?Physical Exam:  ?Vital signs in last 24 hours: ?Temp:  [98 ?F (36.7 ?C)-98.3 ?F (36.8 ?C)] 98 ?F (36.7 ?C) (07/25 1814) ?Pulse Rate:  [58-128] 65 (07/26 0746) ?Resp:  [11-18] 14 (07/26 0217) ?BP: (138-182)/(65-125) 153/88 (07/26 0700) ?SpO2:  [91 %-98 %] 96 % (07/26 0746) ?PE: ?Awake alert oriented x3, cachectic ?Disheveled appearing ?Speech: Fluent and appropriate ?Bilateral upper extremities 3, 4 -/5 throughout ?Interossei wasting is obvious bilaterally ?Positive Hoffmann's bilaterally ?Bilateral lower extremities 3/5 throughout ?Deep tendon  reflexes increased bilateral lower extremities and bilateral upper extremities ?Decree sensation to light touch in the distal extremities ? ? ?Impression/Assessment:  ?61 year old male with ? 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 ?Plan:  ?-MRI reviewed.  There is severe stenosis at C2-3 and C3-4 due to degenerative spondylosis with what appears to be chronic myelomalacia.  There is previous ACDF C4-7, there is also grade 1 anterior listhesis of C7-T1 without high-grade stenosis centrally. ?-Given his progressive symptoms of myelopathy and his inability walk, we did discuss surgical intervention in the form of a C2-4 posterior laminectomy, decompression and instrumentation and fusion.  We discussed all risks, benefits and expected outcomes including heart attack, stroke, death, worsening respiratory or coronary artery disease status, nonunion, need for more surgery, temporary or permanent paralysis.  I answered all of his questions.  He understands that the goal of the surgery is to prevent progression of his myelopathy, and likelihood of him having significant improvement postoperatively is low.  He would like to proceed with surgical intervention during this admission.  His medical issues appear stable, will plan for surgical decompression instrumentation and fusion C2-T4 on Tuesday, 06/12/2021. ? ?Thank you for allowing me to participate in this patient's care.  Please do not hesitate to call with questions or concerns. ? ? ?Mayo Faulk, DO ?Neurosurgeon ?[facility] ?Cell: [phone] ? ? ? ? ?

## 2021-06-10 NOTE — Progress Notes (Signed)
? ? ? [facility] ?                                                                            ? ? ?Raymond [name], is a 61 y.o. male, DOB - 10-20-1960, XLK:[id number] ?Admit date - 05/31/2021    ?Outpatient Primary MD for the patient is [name], MD ? ?LOS - 9  days ? ? ? ?Brief summary  ? ?Past medical history of CAD SP PCI, COPD, chronic respiratory failure, GERD, active smoker, PVD, type II DM.  Presents with complaints of chest pain and elevated troponin as well as COPD exacerbation. ?Cardiology was consulted.  Underwent cardiac catheterization on 3/24 which showed nonobstructive CAD.  Medical management recommended. Patient developed Malena, associated with hypotension and tachycardia.  ?GI consulted and he was started on PPI GTT and transferred to ICU for hypotension and possible pressors.  ?Pt did not require vasopressors and he was transferred to [facility] on 06/05/21.  ?Patient underwent EGD on 3/29 that showed large cratered ulcer in distal third of stomach ?Hemoglobin has been stable and respiratory status appears to be at baseline ?He complained of weakness and ataxia,  ?MRI brain shows small incidental infarct.  Seen by neurology with recommendations for dual antiplatelet therapy when cleared by neurosurgery/GI  ?MRI C spine shows severe spinal stenosis with mass effect on cord ?Seen by neurosurgery with plans for operative management on 4/4 ? ? ?Assessment & Plan  ? ? ?Assessment and Plan: ? ? ? ?GI bleed.  ?Likely upper GI bleed, ?EGD on 3/29 showed large cratered ulcer in distal third of stomach ?Continue PPI ?Discussed with GI and recommended to continue to hold aspirin and plavix for until at least 06/14/2021 ?Continue to follow hemoglobin ? ?Acute blood loss anemia ?-Secondary to GI bleeding ?-Status post 2 units PRBC ?-Continue to follow hemoglobin ? ?Retained gastric contents ?-May have component of gastroparesis since he is on chronic opiates ?-Appears to be tolerating Reglan ? ?Esophageal  candidiasis ?-Started on Diflucan ? ?Ataxia due to severe cervical stenosis and mass effect on cord ?-Patient reports significant/progressive bilateral upper extremity and lower extremity limb ataxia which has been progressing over the past 5 months, worse in the last 2 weeks where he has been unable to stand. He also endorses intermittent urinary incontience ?-Currently, patient has difficulty feeding himself/drinking out of a cup ?-He has difficulty walking with a walker since he is unable to support his weight on his arms ?-Etiology of symptoms is not entirely clear ?-MRI brain shows normal volume of cerebellum, but does comment on small acute infarct in posterior right frontal lobe (suspect incidental) ?-B12 360 ?-Neurology consulted and ordered MRI C/T spine which showed severe spinal stenosis with mass effect on cord ?-Seen by neurosurgery and plans are for operative management on 4/4 ?-PT/OT following, now recommending CIR ? ?Acute cerebral infarct ?-incidental finding on MRI brain ?-Seen by neurology ?-MRA head unremarkable ?-carotid dopplers without any significant stenosis bilaterally ?-Echocardiogram unremarkable ?-Per neuro, will need dual antiplatelet therapy with aspirin 81 mg daily and Plavix 75 mg daily x21 days followed by aspirin 81 mg daily ?-To start antiplatelet therapy when cleared with neurosurgery as well as GI ? ?* Acute  and chronic respiratory failure with hypoxia ([place]) ?COPD with acute exacerbation secondary to community-acquired pneumonia. ?Uses oxygen as needed at home ?Completed the course of doxycycline as well as prednisone taper ?Continue with bronchodilators .  ? ?NSTEMI (non-ST elevated myocardial infarction) ([place]) ?HLD. ?HTN. ?Presented with complaints of chest tightness. ?Troponins were minimally elevated patient was started on IV heparin drip.  Cardiology considers this as non-STEMI. ?Underwent cardiac catheterization which shows nonobstructive CAD. ?Echocardiogram performed, EF  55 to 60%. ?Continue with crestor, and anti platelet agents are on hold for GI bleed.  ?Cardiology currently signed off. ?Outpatient follow-up. ? 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 Abner Greenspan who felt it would be reasonable to have patient follow-up with urology in the next couple weeks with repeat CT abdomen with renal stone protocol ? ?Depression ?We will continue Wellbutrin XL. ? ?Chronic degenerative disc disease ?Reports having surgery on the lumbar spine as well as cervical spine in the past (between 10 to 20 years ago) ?Imaging indicates ACDF as well as lumbar fusion in the past ?His neurosurgeon, Dr. Carloyn Manner is now retired ? ? 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  Monitor. ? ?Protein-calorie malnutrition, severe ?Body mass index is 20.94 kg/m?[name] ?Nutrition Problem: Severe Malnutrition (in the context of chronic illness) ?Etiology: poor appetite ?Nutrition Interventions: ?Interventions: Refer to RD note for recommendations  ? ?Pressure injury of skin ?Present on admission. ?Left lower buttock. ?Wound care consulted. ?For right toe ulcer, cleanse right lateral foot wound with saline, pat dry. Place a small piece of Xeroform gauze over the wound, then dry gauze, secure with a few turns of kerlix. ?  ?For the left buttock/hip wound, cleanse with saline, place a small piece of xeroform gauze over the wound, and cover with a foam dressing. Change both every 2 days. ? ? ?Tobacco use ?Continue nicotine patch. ?Patient was counseled on tobacco cessation. ? ? ?Leukocytosis:  ?Possibly from prednisone.  ?Trending down ? ?  ? ? ?RN Pressure Injury  Documentation: ?Pressure Injury 05/31/21 Sacrum Medial Stage 1 -  Intact skin with non-blanchable redness of a localized area usually over a bony prominence. (Active)  ?05/31/21 2100  ?Location: Sacrum  ?Location Orientation: Medial  ?Staging: Stage 1 -  Intact skin with non-blanchable redness of a localized area usually over a bony prominence.  ?Wound Description (Comments):   ?Present on Admission: Yes  ?Dressing Type Foam - Lift dressing to assess site every shift 06/10/21 0740  ?   ?Pressure Injury 05/31/21 Ischial tuberosity Left Stage 2 -  Partial thickness loss of dermis presenting as a shallow open injury with a red, pink wound bed without slough. (Active)  ?05/31/21 2300  ?Location: Ischial tuberosity  ?Location Orientation: Left  ?Staging: Stage 2 -  Partial thickness loss of dermis presenting as a shallow open injury with a red, pink wound bed without slough.  ?Wound Description (Comments):   ?Present on Admission: Yes  ?Dressing Type Foam - Lift dressing to assess site every shift 06/09/21 1147  ? ? ?Malnutrition Type: ? ?Nutrition Problem: Severe Malnutrition (in the context of chronic illness) ?Etiology: poor appetite ? ? ?Malnutrition Characteristics: ? ?Signs/Symptoms: severe fat depletion, severe muscle depletion, percent weight loss (20.6% x 11.5  months) ?Percent weight loss: 20.6 % ? ? ?Nutrition Interventions: ? ?Interventions: Refer to RD note for recommendations ? ?Estimated body mass index is 21.86 kg/m? as calculated from the following: ?  Height as of this encounter: '5\' 7"'$  (1.702 m). ?  Weight as of this encounter: 63.3 kg. ? ?Code Status: full code.  ?DVT Prophylaxis:  Place and maintain sequential compression device Start: 06/05/21 1735 ? ? ?Level of Care: Level of care: Telemetry Medical ?Family Communication: discussed with patient ? ?Disposition Plan:     Remains inpatient appropriate: Needs further operative management of spinal stenosis ? ?Procedures:  ?3/24: Cardiac cath:  Nonobstructive coronary disease ?3/24 Echo: EF 55:60%, no WMA ?3/29 EGD: candidal esophagitis, large non-obstructing, cratered ulcer occupying distal 1/3 of stomach in pre-pyloric region with patchy areas of necrosis. Lar

## 2021-06-10 NOTE — Plan of Care (Signed)
Neurology plan of care ? ?Please see full neurology consult note from Dr. Lorrin Goodell yesterday 4/1. ? ?Stroke workup is completed. TTE 06/01/21 showed no intracardiac clot or other significant abnl. MRA head no hemodynamically-significant stenosis. Carotid US c/w 1-39% stenosis R ICA and no e/o stenosis L ICA.  ? ?Final recommendations: ? ?# Severe cervical stenosis with myelopathy C2-4 ?- Patient is scheduled for  C2-4 posterior laminectomy, decompression and instrumentation and fusion with Dr. Reatha Armour on Tues ? ?# Acute ischemic stroke ?- Stroke workup completed ?- Continue home rosuvastatin '40mg'$  daily; LDL is at goal ?- Ideally patient would be on ASA '81mg'$  daily + plavix '75mg'$  daily x21 days f/b ASA '81mg'$  daily monotherapy after that but he has not been able to tolerate antiplatelets 2/2 GI bleed. Would resume antiplatelets per above regimen as medically able with respect to GI bleed and with approval from neurosurgery given scheduled surgery on Tues ? ?I will arrange for outpatient neurology f/u for stroke. Neurology to sign off, but please re-engage if additional neurologic concerns arise. ? ?Su Monks, MD ?[facility] ?[phone] ? ?If 7pm- 7am, please page neurology on call as listed in [place]. ? ?

## 2021-06-10 NOTE — Plan of Care (Signed)
?  Problem: Education: ?Goal: Knowledge of General Education information will improve ?Description: Including pain rating scale, medication(s)/side effects and non-pharmacologic comfort measures ?Outcome: Progressing ?  ?Problem: Coping: ?Goal: Level of anxiety will decrease ?Outcome: Progressing ?  ?Problem: Elimination: ?Goal: Will not experience complications related to bowel motility ?Outcome: Progressing ?  ?Problem: Elimination: ?Goal: Will not experience complications related to urinary retention ?Outcome: Completed/Met ?  ?

## 2021-06-10 NOTE — Progress Notes (Signed)
? ? Progress Note ? ? Subjective  ?Chief Complaint: ? ?Eating well denies N, V. ?Has been continuing epigastric discomfort but patient states this is unchanged and same level. ?Has had several bowel movements brown, denies any melena or hematochezia. ?No further chest pain or shortness of breath. ?Being evaluated by neurosurgery for possible surgery Tuesday.  Perseveres cervical stenosis ? ? ? Objective  ? ?Vital signs in last 24 hours: ?Temp:  [97.9 ?F (36.6 ?C)-98.5 ?F (36.9 ?C)] 98.4 ?F (36.9 ?C) (04/02 1134) ?Pulse Rate:  [89-112] 95 (04/02 1134) ?Resp:  [10-17] 10 (04/02 1134) ?BP: (107-139)/(65-105) 107/65 (04/02 1134) ?SpO2:  [91 %-100 %] 96 % (04/02 1134) ?Last BM Date : 06/09/21 ? ?General:   male in no acute distress  ?Heart:  Regular rate and rhythm; no murmurs ?Pulm: Clear anteriorly; no wheezing ?Abdomen:  Soft, Obese AB, Active bowel sounds. No tenderness . , No organomegaly appreciated. ?Extremities:  without  edema. ?Neurologic:  Alert and  oriented x4;  ?Psych:  Cooperative. Normal mood and affect. ? ? ?Intake/Output from previous day: ?04/01 0701 - 04/02 0700 ?In: -  ?Out: 2600 [Urine:2600] ?Intake/Output this shift: ?No intake/output data recorded. ? ?Lab Results: ?Recent Labs  ?  06/08/21 ?2202 06/09/21 ?0255 06/10/21 ?0325  ?WBC 20.5* 19.9* 14.8*  ?HGB 9.1* 8.9* 8.3*  ?HCT 29.2* 28.7* 27.3*  ?PLT 401* 327 374  ? ?BMET ?Recent Labs  ?  06/08/21 ?5427 06/09/21 ?0255 06/10/21 ?0325  ?NA 134* 134* 136  ?K 3.9 3.6 3.1*  ?CL 99 97* 95*  ?CO2 28 28 36*  ?GLUCOSE 88 111* 112*  ?BUN '15 11 11  '$ ?CREATININE 0.61 0.68 0.69  ?CALCIUM 8.1* 8.1* 7.9*  ? ?LFT ?Recent Labs  ?  06/10/21 ?0325  ?PROT 4.3*  ?ALBUMIN 1.6*  ?AST 19  ?ALT 25  ?ALKPHOS 82  ?BILITOT <0.1*  ? ?PT/INR ?No results for input(s): LABPROT, INR in the last 72 hours. ? ?Studies/Results: ?DG Chest 2 View ? ?Result Date: 06/08/2021 ?CLINICAL DATA:  Stroke EXAM: CHEST - 2 VIEW COMPARISON:  06/03/2021 FINDINGS: Heart size and pulmonary vascularity  are normal. Small bilateral pleural effusions with evidence of perihilar infiltration may indicate pulmonary edema, new since prior study. No pneumothorax. Mediastinal contours appear intact. Postoperative changes in the cervical spine. IMPRESSION: Perihilar infiltrates and small effusions.  New since prior study. Electronically Signed   By: Lucienne Capers M.D.   On: 06/08/2021 20:05  ? ?MR ANGIO HEAD WO CONTRAST ? ?Result Date: 06/09/2021 ?CLINICAL DATA:  61 year old male with progressive extremity ataxia. 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 EXAM: MRA HEAD WITHOUT CONTRAST TECHNIQUE: Angiographic images of the Circle of Willis were acquired using MRA technique without intravenous contrast. COMPARISON:  Brain MRI 06/07/2021. FINDINGS: Anterior circulation: Antegrade flow in both ICA siphons. No siphon stenosis. Ophthalmic and left posterior communicating artery origins appear normal. Mild motion artifact at the ICA termini, MCA and ACA origins which remain patent. Continued motion artifact affecting detail of the bilateral MCA and ACA branches, with no branch occlusion evident. Posterior circulation: Antegrade flow in the posterior circulation with dominant appearing distal right vertebral artery as on the MRI. Patent distal vertebral arteries and PICA origins. Patent vertebrobasilar junction and basilar artery with no stenosis identified. Patent AICA, SCA and PCA origins. Left posterior communicating artery is present, right is diminutive or absent. Bilateral PCA branches are within normal limits. Anatomic variants: Dominant right vertebral artery. Other: No intracranial mass  effect or ventriculomegaly. IMPRESSION: Negative intracranial MRA when allowing for motion artifact at the level of the MCA and ACA branches. Electronically Signed   By: Genevie Ann M.D.   On: 06/09/2021 11:52  ? ?MR CERVICAL SPINE W WO CONTRAST ? ?Addendum Date: 06/09/2021   ?ADDENDUM REPORT:  06/09/2021 12:19 ADDENDUM: Study discussed by telephone with Dr. Quinn Axe on 06/09/2021 at 12:03 . Electronically Signed   By: Genevie Ann M.D.   On: 06/09/2021 12:19  ? ?Result Date: 06/09/2021 ?CLINICAL DATA:  61 year old male with progressive extremity ataxia. 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 EXAM: MRI CERVICAL SPINE WITHOUT AND WITH CONTRAST TECHNIQUE: Multiplanar and multiecho pulse sequences of the cervical spine, to include the craniocervical junction and cervicothoracic junction were obtained without and with intravenous contrast. CONTRAST:  43m GADAVIST GADOBUTROL 1 MMOL/ML IV SOLN COMPARISON:  Brain MRI 06/07/2021. Cervical spine MRI 01/15/2005. FINDINGS: Study is intermittently degraded by motion artifact despite repeated imaging attempts. Alignment: Increase straightening of cervical lordosis compared to the preoperative MRI in 2006, and new 2-3 mm degenerative appearing anterolisthesis of C7 on T1. Vertebrae: Hardware susceptibility artifact related to interval C4 through C7 ACDF. Degenerative endplate marrow signal changes at both C2-C3 and C3-C4 (see below), with no convincing No acute osseous abnormality identified. Cord: Abnormal spinal cord at both the C2-C3 and C3-C4 levels, where degenerative cord compression appears associated with subtle cord signal changes suspicious for myelomalacia. Below C3 spinal cord signal and morphology appear more normal. No abnormal intradural enhancement. Degenerative appearing dorsal dural thickening. Posterior Fossa, vertebral arteries, paraspinal tissues: Cervicomedullary junction is within normal limits. Negative visible posterior fossa. Evidence of dominant right vertebral artery. No definite acute neck soft tissue finding. Disc levels: C2-C3: Bulky right eccentric posterior disc osteophyte complex (series 6, image 12) superimposed on moderate to severe thickening of the ligament flavum and at least moderate bilateral  facet hypertrophy. Degenerative facet joint fluid on the left. Moderate to severe spinal stenosis and spinal cord mass effect (series 6, images 12 and 13 and series 4, image 9) with suspected abnormal cord signal as above. Moderate right and moderate to severe left C3 foraminal stenosis. C3-C4: Severe disc space loss and bulky circumferential disc osteophyte complex eccentric to the left. Moderate facet and ligament flavum hypertrophy. Moderate spinal stenosis and spinal cord mass effect. Moderate to severe bilateral C4 foraminal stenosis. C4-C5:  Prior ACDF.  No spinal stenosis. C5-C6: Prior ACDF. Mild right paracentral endplate spurring but no significant spinal stenosis. C6-C7:  Prior ACDF.  No spinal stenosis. C7-T1: Anterolisthesis with disc space loss and lobulated left greater than right paracentral and foraminal disc osteophyte complex. Borderline to mild spinal stenosis (series 6, image 35). No spinal cord mass effect. Mild to moderate facet hypertrophy. Mild to moderate bilateral C8 foraminal stenosis. Thoracic spine is detailed separately today. IMPRESSION: 1. 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 2. 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 3. Degenerative moderate, occasionally severe, neural foraminal stenosis at the bilateral C3, C4, C8 nerve levels. Electronically Signed: By: HGenevie AnnM.D. On: 06/09/2021 11:59  ? ?MR THORACIC SPINE W WO CONTRAST ? ?Result Date: 06/09/2021 ?CLINICAL DATA:  61year old male with progressive extremity ataxia. Evidence of a small acute infarct in the posterior right frontal lobe on recent  MRI. EXAM: MRI THORACIC WITHOUT AND WITH CONTRAST TECHNIQUE: Multiplanar and multiecho pulse sequences of the thoracic spine were obtained without and with intravenous contrast. CONTRAST:   2m GADAVIST GADOBUTROL 1 MMOL/ML IV SOLN in conjunction with contrast enhanced imaging of the cervical spine reported separately. COMPARISON:  Cervical spine MRI today reported separately. Chest CT 09/02/2013

## 2021-06-11 ENCOUNTER — Other Ambulatory Visit: Payer: Self-pay | Admitting: Neurological Surgery

## 2021-06-11 DIAGNOSIS — D62 Acute posthemorrhagic anemia: Secondary | ICD-10-CM | POA: Diagnosis not present

## 2021-06-11 DIAGNOSIS — K250 Acute gastric ulcer with hemorrhage: Secondary | ICD-10-CM | POA: Diagnosis not present

## 2021-06-11 DIAGNOSIS — D509 Iron deficiency anemia, unspecified: Secondary | ICD-10-CM | POA: Diagnosis not present

## 2021-06-11 LAB — BASIC METABOLIC PANEL
Anion gap: 6 (ref 5–15)
BUN: 10 mg/dL (ref 6–20)
CO2: 33 mmol/L — ABNORMAL HIGH (ref 22–32)
Calcium: 7.9 mg/dL — ABNORMAL LOW (ref 8.9–10.3)
Chloride: 92 mmol/L — ABNORMAL LOW (ref 98–111)
Creatinine, Ser: 0.59 mg/dL — ABNORMAL LOW (ref 0.61–1.24)
GFR, Estimated: >60 mL/min (ref >60)
Glucose, Bld: 68 mg/dL — ABNORMAL LOW (ref 70–99)
Potassium: 4.3 mmol/L (ref 3.5–5.1)
Sodium: 131 mmol/L — ABNORMAL LOW (ref 135–145)

## 2021-06-11 LAB — CBC
HCT: 28.4 % — ABNORMAL LOW (ref 39.0–52.0)
Hemoglobin: 8.7 g/dL — ABNORMAL LOW (ref 13.0–17.0)
MCH: 25 pg — ABNORMAL LOW (ref 26.0–34.0)
MCHC: 30.6 g/dL (ref 30.0–36.0)
MCV: 81.6 fL (ref 80.0–100.0)
Platelets: 363 10*3/uL (ref 150–400)
RBC: 3.48 MIL/uL — ABNORMAL LOW (ref 4.22–5.81)
RDW: 24.8 % — ABNORMAL HIGH (ref 11.5–15.5)
WBC: 14.4 10*3/uL — ABNORMAL HIGH (ref 4.0–10.5)
nRBC: 0.1 % (ref 0.0–0.2)

## 2021-06-11 LAB — GLUCOSE, CAPILLARY
Glucose-Capillary: 101 mg/dL — ABNORMAL HIGH (ref 70–99)
Glucose-Capillary: 117 mg/dL — ABNORMAL HIGH (ref 70–99)
Glucose-Capillary: 105 mg/dL — ABNORMAL HIGH (ref 70–99)
Glucose-Capillary: 86 mg/dL (ref 70–99)

## 2021-06-11 MED ORDER — CHLORHEXIDINE GLUCONATE CLOTH 2 % EX PADS
6.0000 | MEDICATED_PAD | Freq: Once | CUTANEOUS | Status: AC
  Administered 2021-06-12: 6 via TOPICAL

## 2021-06-11 MED ORDER — POLYETHYLENE GLYCOL 3350 17 G PO PACK
17.0000 g | PACK | Freq: Every day | ORAL | Status: DC
  Administered 2021-06-11 – 2021-06-13 (×2): 17 g via ORAL
  Filled 2021-06-11 (×4): qty 1

## 2021-06-11 MED ORDER — CHLORHEXIDINE GLUCONATE CLOTH 2 % EX PADS
6.0000 | MEDICATED_PAD | Freq: Once | CUTANEOUS | Status: AC
  Administered 2021-06-11: 6 via TOPICAL

## 2021-06-11 MED ORDER — VANCOMYCIN HCL IN DEXTROSE 1-5 GM/200ML-% IV SOLN
1000.0000 mg | INTRAVENOUS | Status: DC
  Filled 2021-06-11: qty 200

## 2021-06-11 NOTE — Progress Notes (Signed)
Inpatient Rehab Admissions Coordinator:  ?Saw pt at bedside. Pt informed AC he would prefer to get therapy at a SNF in [place] instead of pursuing CIR. TOC made aware. AC will sign off. ? ? ?Gayland Curry, MS, CCC-SLP ?Admissions Coordinator ?[phone] ? ?

## 2021-06-11 NOTE — Progress Notes (Signed)
UNASSIGNED PATIENT  ?Subjective: ?Since I last evaluated the patient, he seems to be doing well and denies having any abdominal pain, nausea or vomiting.  No history of melena hematochezia.  He is scheduled for C2-4 posterior cervical decompression, instrumentation and fusion. ? ?Objective: ?Vital signs in last 24 hours: ?Temp:  [98.5 ?F (36.9 ?C)-99 ?F (37.2 ?C)] 98.5 ?F (36.9 ?C) (04/03 1436) ?Pulse Rate:  [83-96] 83 (04/03 1436) ?Resp:  [16-23] 21 (04/03 1436) ?BP: (94-125)/(64-71) 94/64 (04/03 1436) ?SpO2:  [97 %-100 %] 97 % (04/03 1436) ?Last BM Date : 06/10/21 ? ?Intake/Output from previous day: ?04/02 0701 - 04/03 0700 ?In: -  ?Out: 1700 [Urine:1700] ?Intake/Output this shift: ?Total I/O ?In: 240 [P.O.:240] ?Out: 1000 [Urine:1000] ? ?General appearance: alert, cooperative, appears stated age, fatigued, and no distress ?Resp: clear to auscultation bilaterally ?Cardio: regular rate and rhythm, S1, S2 normal, no murmur, click, rub or gallop ?GI: soft, non-tender; bowel sounds normal; no masses,  no organomegaly ? ?Lab Results: ?Recent Labs  ?  06/09/21 ?1610 06/10/21 ?0325 06/11/21 ?0148  ?WBC 19.9* 14.8* 14.4*  ?HGB 8.9* 8.3* 8.7*  ?HCT 28.7* 27.3* 28.4*  ?PLT 327 374 363  ? ?BMET ?Recent Labs  ?  06/09/21 ?0255 06/10/21 ?0325 06/11/21 ?0148  ?NA 134* 136 131*  ?K 3.6 3.1* 4.3  ?CL 97* 95* 92*  ?CO2 28 36* 33*  ?GLUCOSE 111* 112* 68*  ?BUN '11 11 10  '$ ?CREATININE 0.68 0.69 0.59*  ?CALCIUM 8.1* 7.9* 7.9*  ? ?LFT ?Recent Labs  ?  06/10/21 ?0325  ?PROT 4.3*  ?ALBUMIN 1.6*  ?AST 19  ?ALT 25  ?ALKPHOS 82  ?BILITOT <0.1*  ? ?PT/INR ?No results for input(s): LABPROT, INR in the last 72 hours. ?Hepatitis Panel ?No results for input(s): HEPBSAG, HCVAB, HEPAIGM, HEPBIGM in the last 72 hours. ?C-Diff ?No results for input(s): CDIFFTOX in the last 72 hours. ?No results for input(s): CDIFFPCR in the last 72 hours. ?Fecal Lactopherrin ?No results for input(s): FECLLACTOFRN in the last 72 hours. ? ?Studies/Results: ?No results  found. ? ?Medications: I have reviewed the patient's current medications. ?Prior to Admission:  ?Medications Prior to Admission  ?Medication Sig Dispense Refill Last Dose  ? albuterol (ACCUNEB) 0.63 MG/3ML nebulizer solution Take 1 ampule by nebulization every 6 (six) hours as needed for wheezing or shortness of breath.   05/30/2021  ? albuterol (PROVENTIL HFA;VENTOLIN HFA) 108 (90 BASE) MCG/ACT inhaler Inhale 2 puffs into the lungs every 6 (six) hours as needed for wheezing.   05/31/2021  ? allopurinol (ZYLOPRIM) 100 MG tablet Take 100 mg by mouth in the morning.   05/30/2021  ? aspirin 81 MG tablet Take 81 mg by mouth daily.   05/31/2021  ? buPROPion (WELLBUTRIN XL) 150 MG 24 hr tablet Take 1 tablet by mouth daily at 6 (six) AM.   05/30/2021  ? calcium carbonate (TUMS EX) 750 MG chewable tablet Chew 2 tablets by mouth as needed for heartburn.   Past Week  ? clopidogrel (PLAVIX) 75 MG tablet Take 1 tablet (75 mg total) by mouth daily at 6 (six) AM. 30 tablet 2 05/30/2021  ? gabapentin (NEURONTIN) 300 MG capsule Take 300 mg by mouth 3 (three) times daily.   05/30/2021  ? Magnesium Hydroxide (DULCOLAX SOFT CHEWS) 1200 MG CHEW Chew 1 tablet by mouth as needed (constipation).   Past Week  ? magnesium oxide (MAG-OX) 400 MG tablet Take 1 tablet by mouth daily.   05/30/2021  ? metFORMIN (GLUCOPHAGE) 500 MG tablet Take  2 tablets by mouth 2 (two) times daily.   05/30/2021  ? metoprolol tartrate (LOPRESSOR) 25 MG tablet TAKE 1/2 TABLET BY MOUTH TWICE DAILY Needs appointment for further refills (Patient taking differently: Take 12.5 mg by mouth 2 (two) times daily.) 15 tablet 0 05/30/2021 at 1200  ? omeprazole (PRILOSEC) 40 MG capsule Take 1 capsule by mouth daily at 6 (six) AM.   05/30/2021  ? ondansetron (ZOFRAN) 4 MG tablet Take 4 mg by mouth 3 (three) times daily as needed.   Past Week  ? rosuvastatin (CRESTOR) 10 MG tablet Take 2 tablets (20 mg total) by mouth at bedtime.   05/30/2021  ? tamsulosin (FLOMAX) 0.4 MG CAPS capsule Take  0.4 mg by mouth in the morning.   05/30/2021  ? [DISCONTINUED] oxyCODONE-acetaminophen (PERCOCET) 10-325 MG per tablet Take 1-2 tablets by mouth 4 (four) times daily as needed for pain. max FIVE PER DAY   05/30/2021  ? silver sulfADIAZINE (SILVADENE) 1 % cream Apply 1 application topically 2 (two) times daily. apply a 1/16 inch (1.5 mm) thick layer to entire burn area     ? ?Scheduled: ?  stroke: mapping our early stages of recovery book   Does not apply Once  ? allopurinol  100 mg Oral q AM  ? buPROPion  150 mg Oral Q0600  ? feeding supplement  237 mL Oral TID BM  ? fluconazole  300 mg Oral Daily  ? gabapentin  100 mg Oral TID  ? guaiFENesin  600 mg Oral BID  ? influenza vac split quadrivalent PF  0.5 mL Intramuscular Tomorrow-1000  ? insulin aspart  0-9 Units Subcutaneous TID WC  ? ipratropium-albuterol  3 mL Nebulization BID  ? isosorbide mononitrate  15 mg Oral Daily  ? magnesium oxide  400 mg Oral Daily  ? mouth rinse  15 mL Mouth Rinse BID  ? metoCLOPramide (REGLAN) injection  5 mg Intravenous Q6H  ? multivitamin with minerals  1 tablet Oral Daily  ? nicotine  21 mg Transdermal Daily  ? nutrition supplement (JUVEN)  1 packet Oral BID BM  ? pantoprazole  40 mg Intravenous Q12H  ? polyethylene glycol  17 g Oral Daily  ? rosuvastatin  40 mg Oral Daily  ? saccharomyces boulardii  250 mg Oral BID  ? tamsulosin  0.4 mg Oral q AM  ? ?Continuous: ? ?Assessment/Plan: ?1) Large gastric ulcer with necrosis noted on EGD done on 06/06/2021-it would be important to [place] NSAIDS DURINGA ND AFTER THE SURGERY, Patient to be continued on PPI's postoperatively as well. ?2) Acute blood loss anemia status post 2 units of PRBC's. ?3) Esophageal candidiasis on Diflucan. ?4) Acute on chronic respiratory failure with hypoxia ?5) COPD with acute exacerbation secondary to CAP. ?6) Ataxia due to cervical stenosis-decompression planned for tomorrow. ?7) NSTEMI/HTN/Hyperlipidemia.[name] ?8) Protein calorie and malnutrition. ?9)  Chronic DDD. ? LOS: 10 days  ? ?Raymond [name] ?06/11/2021, 2:41 PM ? ? ?

## 2021-06-11 NOTE — Care Management Important Message (Signed)
Important Message ? ?Patient Details  ?Name: Raymond [name] ?MRN: [id number] ?Date of Birth: Nov 09, 1960 ? ? ?Medicare Important Message Given:  Yes ? ? ? ? ?Shelda Altes ?06/11/2021, 9:25 AM ?

## 2021-06-11 NOTE — Progress Notes (Signed)
Physical Therapy Treatment ?Patient Details ?Name: Raymond [name] ?MRN: [id number] ?DOB: 1960/10/22 ?Today's Date: 06/11/2021 ? ? ?History of Present Illness 61 yo admitted 3/23 with chest pain. 3/24 pt s/p cardiac cath with coffee ground emesis and respiratory failure. 3/29 EGD. 4/1 C4-7 mass effect on spinal cord noted on imaging. Plan of ACDF 4/4.  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 ?  ?PT Comments  ? ? Pt pleasant and reports right hip pain as well as continued ataxia and desire to progress function with pt stating Sx tomorrow. Pt attempting to assist with transfers with bil UE and LE limited by weakness and ataxia with incontinent bowel and continued fatigue and increasing assist with repeated transfers during session. Returned to bed per pt request and will continue to follow.  ?   ?Recommendations for follow up therapy are one component of a multi-disciplinary discharge planning process, led by the attending physician.  Recommendations may be updated based on patient status, additional functional criteria and insurance authorization. ? ?Follow Up Recommendations ? Acute inpatient rehab (3hours/day) ?  ?  ?Assistance Recommended at Discharge Frequent or constant Supervision/Assistance  ?Patient can return home with the following A lot of help with walking and/or transfers;A little help with bathing/dressing/bathroom;Assistance with cooking/housework;Assist for transportation;Help with stairs or ramp for entrance ?  ?Equipment Recommendations ? Wheelchair (measurements PT);Wheelchair cushion (measurements PT)  ?  ?Recommendations for Other Services   ? ? ?  ?Precautions / Restrictions Precautions ?Precautions: Fall;Cervical ?Precaution Booklet Issued: No ?Precaution Comments: Watch O2, incontinent, ataxia bil UE and RLE  ?  ? ?Mobility ? Bed Mobility ?Overal bed mobility: Needs Assistance ?Bed Mobility: Rolling, Sidelying to Sit, Sit to Supine ?Rolling: Mod  assist ?Sidelying to sit: Mod assist ?  ?Sit to supine: Mod assist ?  ?General bed mobility comments: mod assist for rolling for pericare and rise from side to sitting as well as return to bed end of session as pt stated he couldn't tolerate sitting due to hip pain ?  ? ?Transfers ?Overall transfer level: Needs assistance ?  ?Transfers: Sit to/from Stand, Bed to chair/wheelchair/BSC ?Sit to Stand: Max assist ?Stand pivot transfers: Mod assist, +2 safety/equipment ?  ?Squat pivot transfers: Max assist, +2 safety/equipment ?  ?  ?General transfer comment: pt with initial stand from bed with mod assist with left knee blocked and RLE immediately extending at him upon standing with pt unable to control ataxia to step with pt returned to sitting then RLE positioned with +2 assist and blocking to stand again and stand pivot toward right to chair. Pt with urge to void with max +2 assist for squat pivot and stand pivot bil chair<>BSC then chair to bed as pt stated uncomfortable and unable to tolerate sitting. PT with progressive fatigue and assist required through trials with increased lack of control of ataxia. PT initially attempting to grasp therapist waist with bil UE to rise ?  ? ?Ambulation/Gait ?  ?  ?  ?  ?  ?  ?  ?General Gait Details: unable ? ? ?Stairs ?  ?  ?  ?  ?  ? ? ?Wheelchair Mobility ?  ? ?Modified Rankin (Stroke Patients Only) ?  ? ? ?  ?Balance Overall balance assessment: Needs assistance ?Sitting-balance support: No upper extremity supported, Feet supported ?Sitting balance-Leahy Scale: Poor ?Sitting balance - Comments: pt with anterior lean and guarding for safety with sitting EOB, chair and  BSC this session ?  ?Standing balance support: No upper extremity supported ?Standing balance-Leahy Scale: Poor ?Standing balance comment: bil knees blocked and physical assist with belt to maintain standing ?  ?  ?  ?  ?  ?  ?  ?  ?  ?  ?  ?  ? ?  ?Cognition Arousal/Alertness: Awake/alert ?Behavior During Therapy:  Flat affect ?Overall Cognitive Status: Impaired/Different from baseline ?  ?  ?  ?  ?  ?  ?  ?  ?  ?  ?  ?  ?  ?Safety/Judgement: Decreased awareness of safety, Decreased awareness of deficits ?  ?Problem Solving: Slow processing, Requires verbal cues, Requires tactile cues ?General Comments: pt with incontinent bowel and unaware but asking therapy to check. Maintained ataxia with pt with decreased awareness of deficits ?  ?  ? ?  ?Exercises   ? ?  ?General Comments   ?  ?  ? ?Pertinent Vitals/Pain Pain Assessment ?Pain Score: 4  ?Pain Location: right hip pain ?Pain Descriptors / Indicators: Guarding, Aching ?Pain Intervention(s): Limited activity within patient's tolerance, Monitored during session, Repositioned  ? ? ?Home Living   ?  ?  ?  ?  ?  ?  ?  ?  ?  ?   ?  ?Prior Function    ?  ?  ?   ? ?PT Goals (current goals can now be found in the care plan section) Progress towards PT goals: Progressing toward goals ? ?  ?Frequency ? ? ? Min 3X/week ? ? ? ?  ?PT Plan Current plan remains appropriate  ? ? ?Co-evaluation   ?  ?  ?  ?  ? ?  ?AM-PAC PT "6 Clicks" Mobility   ?Outcome Measure ? Help needed turning from your back to your side while in a flat bed without using bedrails?: A Lot ?Help needed moving from lying on your back to sitting on the side of a flat bed without using bedrails?: A Lot ?Help needed moving to and from a bed to a chair (including a wheelchair)?: Total ?Help needed standing up from a chair using your arms (e.g., wheelchair or bedside chair)?: Total ?Help needed to walk in hospital room?: Total ?Help needed climbing 3-5 steps with a railing? : Total ?6 Click Score: 8 ? ?  ?End of Session Equipment Utilized During Treatment: Gait belt;Oxygen ?Activity Tolerance: Patient tolerated treatment well ?Patient left: in bed;with call bell/phone within reach;with bed alarm set ?Nurse Communication: Mobility status;Precautions ?PT Visit Diagnosis: Other abnormalities of gait and mobility  (R26.89);Difficulty in walking, not elsewhere classified (R26.2);Muscle weakness (generalized) (M62.81);Other symptoms and signs involving the nervous system (R29.898) ?  ? ? ?Time: 1829-9371 ?PT Time Calculation (min) (ACUTE ONLY): 30 min ? ?Charges:  $Therapeutic Activity: 23-37 mins          ?          ? ?Kendryck Lacroix P, PT ?Acute Rehabilitation Services ?Pager: [phone] ?Office: [phone] ? ? ? ?Preciosa Bundrick B Clarke Amburn ?06/11/2021, 10:19 AM ? ?

## 2021-06-11 NOTE — Progress Notes (Signed)
? ?  Providing Compassionate, Quality Care - Together ? ?NEUROSURGERY PROGRESS NOTE ? ? ?S: No issues overnight.  ? ?O: EXAM:  ?BP 106/65 (BP Location: Right Arm)   Pulse 92   Temp 98.6 ?F (37 ?C) (Oral)   Resp (!) 23   Ht '5\' 7"'$  (1.702 m)   Wt 63.3 kg   SpO2 99%   BMI 21.86 kg/m?  ? ?Awake alert oriented x3, cachectic ?Disheveled appearing ?Speech: Fluent and appropriate ?Bilateral upper extremities 3, 4 -/5 throughout ?Interossei wasting is obvious bilaterally ?Positive Hoffmann's bilaterally ?Bilateral lower extremities 3/5 throughout ?Deep tendon reflexes increased bilateral lower extremities and bilateral upper extremities ?Decreased sensation to light touch in the distal extremities ?   ? ?ASSESSMENT:  ?61 y.o. male with  ? 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 ?PLAN: ?-Planned OR tomorrow for C2-4 posterior cervical decompression, instrumentation and fusion.  I discussed all risks, benefits and expected outcomes with the patient.  He agrees to proceed. ?-He appears medically optimized, does understand there is some increased risk given his recent medical issues however his myelopathy is quite severe and therefore he would like intervention as soon as possible. ? ? ? ?Thank you for allowing me to participate in this patient's care.  Please do not hesitate to call with questions or concerns. ? ? ?Demetreus Lothamer, DO ?Neurosurgeon ?[facility] ?Cell: [phone] ? ?

## 2021-06-11 NOTE — TOC Progression Note (Addendum)
Transition of Care (TOC) - Progression Note  ? ? ?Patient Details  ?Name: PARRISH BONN ?MRN: [id number] ?Date of Birth: 17-May-1960 ? ?Transition of Care (TOC) CM/SW Contact  ?Milas Gain, LCSWA ?Phone Number: ?06/11/2021, 12:43 PM ? ?Clinical Narrative:    ? ?CSW received message from CIR patient still interested in SNF. Patient has SN[facility] bed [facility]. Insurance Josem Kaufmann has been approved. Patient updated.CSW informed Bryson Ha with [facility]. CSW will continue to follow and assist with patients dc planning needs. ? ?Expected Discharge Plan: [place] ?Barriers to Discharge: Continued Medical Work up ? ?Expected Discharge Plan and Services ?Expected Discharge Plan: [place] ?In-house Referral: Clinical Social Work ?  ?  ?Living arrangements for the past 2 months: [place] ?                ?  ?  ?  ?  ?  ?  ?  ?  ?  ?  ? ? ?Social Determinants of Health (SDOH) Interventions ?  ? ?Readmission Risk Interventions ? ?  12/13/2020  ?  2:02 PM  ?Readmission Risk Prevention Plan  ?Transportation Screening Complete  ?PCP or Specialist Appt within 5-7 Days Complete  ?Home Care Screening Complete  ?Medication Review (RN CM) Complete  ? ? ?

## 2021-06-11 NOTE — Progress Notes (Signed)
? ? ? [facility] ?                                                                            ? ? ?Raymond [name], is a 61 y.o. male, DOB - [birth year], FSF:[id number] ?Admit date - 05/31/2021    ?Outpatient Primary MD for the patient is [name], MD ? ?LOS - 10  days ? ? ? ?Brief summary  ? ?Past medical history of CAD SP PCI, COPD, chronic respiratory failure, GERD, active smoker, PVD, type II DM.  Presents with complaints of chest pain and elevated troponin as well as COPD exacerbation. ?Cardiology was consulted.  Underwent cardiac catheterization on 3/24 which showed nonobstructive CAD.  Medical management recommended. Patient developed Malena, associated with hypotension and tachycardia.  ?GI consulted and he was started on PPI GTT and transferred to ICU for hypotension and possible pressors.  ?Pt did not require vasopressors and he was transferred to [facility] on 06/05/21.  ?Patient underwent EGD on 3/29 that showed large cratered ulcer in distal third of stomach ?Hemoglobin has been stable and respiratory status appears to be at baseline ?He complained of weakness and ataxia,  ?MRI brain shows small incidental infarct.  Seen by neurology with recommendations for dual antiplatelet therapy when cleared by neurosurgery/GI  ?MRI C spine shows severe spinal stenosis with mass effect on cord ?Seen by neurosurgery with plans for operative management on 4/4 ? ? ?Assessment & Plan  ? ? ?Assessment and Plan: ? ? ? ?GI bleed.  ?Likely upper GI bleed, ?EGD on 3/29 showed large cratered ulcer in distal third of stomach ?Continue PPI ?Discussed with GI and recommended to continue to hold aspirin and plavix for until at least 06/14/2021 ?Continue to follow hemoglobin ? ?Acute blood loss anemia ?-Secondary to GI bleeding ?-Status post 2 units PRBC ?-Continue to follow hemoglobin ? ?Retained gastric contents ?-May have component of gastroparesis since he is on chronic opiates ?-Appears to be tolerating Reglan ? ?Esophageal  candidiasis ?-Started on Diflucan ? ?Ataxia due to severe cervical stenosis and mass effect on cord ?-Patient reports significant/progressive bilateral upper extremity and lower extremity limb ataxia which has been progressing over the past 5 months, worse in the last 2 weeks where he has been unable to stand. He also endorses intermittent urinary incontience ?-Currently, patient has difficulty feeding himself/drinking out of a cup ?-He has difficulty walking with a walker since he is unable to support his weight on his arms ?-Etiology of symptoms is not entirely clear ?-MRI brain shows normal volume of cerebellum, but does comment on small acute infarct in posterior right frontal lobe (suspect incidental) ?-B12 360 ?-Neurology consulted and ordered MRI C/T spine which showed severe spinal stenosis with mass effect on cord ?-Seen by neurosurgery and plans are for operative management on 4/4 ?-PT/OT following, now recommending CIR ? ?Acute cerebral infarct ?-incidental finding on MRI brain ?-Seen by neurology ?-MRA head unremarkable ?-carotid dopplers without any significant stenosis bilaterally ?-Echocardiogram unremarkable ?-Per neuro, will need dual antiplatelet therapy with aspirin 81 mg daily and Plavix 75 mg daily x21 days followed by aspirin 81 mg daily ?-To start antiplatelet therapy when cleared with neurosurgery as well as GI ? ?* Acute  and chronic respiratory failure with hypoxia ([place]) ?COPD with acute exacerbation secondary to community-acquired pneumonia. ?Uses oxygen as needed at home ?Completed the course of doxycycline as well as prednisone taper ?Continue with bronchodilators .  ? ?NSTEMI (non-ST elevated myocardial infarction) ([place]) ?HLD. ?HTN. ?Presented with complaints of chest tightness. ?Troponins were minimally elevated patient was started on IV heparin drip.  Cardiology considers this as non-STEMI. ?Underwent cardiac catheterization which shows nonobstructive CAD. ?Echocardiogram performed, EF  55 to 60%. ?Continue with crestor, and anti platelet agents are on hold for GI bleed.  ?Cardiology currently signed off. ?Outpatient follow-up. ? 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 Abner Greenspan who felt it would be reasonable to have patient follow-up with urology in the next couple weeks with repeat CT abdomen with renal stone protocol ? ?Depression ?We will continue Wellbutrin XL. ? ?Chronic degenerative disc disease ?Reports having surgery on the lumbar spine as well as cervical spine in the past (between 10 to 20 years ago) ?Imaging indicates ACDF as well as lumbar fusion in the past ?His previous neurosurgeon, Dr. Carloyn Manner is now retired ? ? 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  Monitor. ? ?Protein-calorie malnutrition, severe ?Body mass index is 20.94 kg/m?[name] ?Nutrition Problem: Severe Malnutrition (in the context of chronic illness) ?Etiology: poor appetite ?Nutrition Interventions: ?Interventions: Refer to RD note for recommendations  ? ?Pressure injury of skin ?Present on admission. ?Left lower buttock. ?Wound care consulted. ?For right toe ulcer, cleanse right lateral foot wound with saline, pat dry. Place a small piece of Xeroform gauze over the wound, then dry gauze, secure with a few turns of kerlix. ?  ?For the left buttock/hip wound, cleanse with saline, place a small piece of xeroform gauze over the wound, and cover with a foam dressing. Change both every 2 days. ? ? ?Tobacco use ?Continue nicotine patch. ?Patient was counseled on tobacco cessation. ? ? ?Leukocytosis:  ?Possibly from prednisone.  ?Trending down ? ?  ? ? ?RN Pressure Injury  Documentation: ?Pressure Injury 05/31/21 Sacrum Medial Stage 1 -  Intact skin with non-blanchable redness of a localized area usually over a bony prominence. (Active)  ?05/31/21 2100  ?Location: Sacrum  ?Location Orientation: Medial  ?Staging: Stage 1 -  Intact skin with non-blanchable redness of a localized area usually over a bony prominence.  ?Wound Description (Comments):   ?Present on Admission: Yes  ?Dressing Type Other (Comment) 06/11/21 0537  ?   ?Pressure Injury 05/31/21 Ischial tuberosity Left Stage 2 -  Partial thickness loss of dermis presenting as a shallow open injury with a red, pink wound bed without slough. (Active)  ?05/31/21 2300  ?Location: Ischial tuberosity  ?Location Orientation: Left  ?Staging: Stage 2 -  Partial thickness loss of dermis presenting as a shallow open injury with a red, pink wound bed without slough.  ?Wound Description (Comments):   ?Present on Admission: Yes  ?Dressing Type Other (Comment) 06/11/21 0537  ? ? ?Malnutrition Type: ? ?Nutrition Problem: Severe Malnutrition (in the context of chronic illness) ?Etiology: poor appetite ? ? ?Malnutrition Characteristics: ? ?Signs/Symptoms: severe fat depletion, severe muscle depletion, percent weight loss (20.6% x 11.5 months) ?Percent weight loss: 20.6 % ? ? ?Nutrition Interventions: ? ?Interventions: Refer  to RD note for recommendations ? ?Estimated body mass index is 21.86 kg/m? as calculated from the following: ?  Height as of this encounter: '5\' 7"'$  (1.702 m). ?  Weight as of this encounter: 63.3 kg. ? ?Code Status: full code.  ?DVT Prophylaxis:  Place and maintain sequential compression device Start: 06/05/21 1735 ? ? ?Level of Care: Level of care: Telemetry Medical ?Family Communication: discussed with patient ? ?Disposition Plan:     Remains inpatient appropriate: Needs further operative management of spinal stenosis ? ?Procedures:  ?3/24: Cardiac cath: Nonobstructive coronary disease ?3/24 Echo: EF 55:60%, no WMA ?3/29 EGD:  candidal esophagitis, large non-obstructing, cratered ulcer occupying distal 1/3 of stomach in pre-pyloric region with patchy areas of necrosis. Large amount of solid debris in stomach ? ?Consultants:

## 2021-06-12 ENCOUNTER — Inpatient Hospital Stay (HOSPITAL_COMMUNITY): Payer: Medicare HMO | Admitting: Certified Registered Nurse Anesthetist

## 2021-06-12 ENCOUNTER — Encounter (HOSPITAL_COMMUNITY)
Admission: AD | Disposition: A | Discharge status: Skilled Nursing Facility | Payer: Self-pay | Source: Other Acute Inpatient Hospital | Attending: Internal Medicine

## 2021-06-12 ENCOUNTER — Encounter (HOSPITAL_COMMUNITY): Payer: Self-pay | Admitting: Internal Medicine

## 2021-06-12 DIAGNOSIS — K250 Acute gastric ulcer with hemorrhage: Secondary | ICD-10-CM | POA: Diagnosis not present

## 2021-06-12 DIAGNOSIS — E876 Hypokalemia: Secondary | ICD-10-CM

## 2021-06-12 DIAGNOSIS — D509 Iron deficiency anemia, unspecified: Secondary | ICD-10-CM | POA: Diagnosis not present

## 2021-06-12 DIAGNOSIS — D62 Acute posthemorrhagic anemia: Secondary | ICD-10-CM | POA: Diagnosis not present

## 2021-06-12 LAB — CBC
HCT: 30.5 % — ABNORMAL LOW (ref 39.0–52.0)
Hemoglobin: 9.1 g/dL — ABNORMAL LOW (ref 13.0–17.0)
MCH: 24.6 pg — ABNORMAL LOW (ref 26.0–34.0)
MCHC: 29.8 g/dL — ABNORMAL LOW (ref 30.0–36.0)
MCV: 82.4 fL (ref 80.0–100.0)
Platelets: 392 10*3/uL (ref 150–400)
RBC: 3.7 MIL/uL — ABNORMAL LOW (ref 4.22–5.81)
RDW: 25.2 % — ABNORMAL HIGH (ref 11.5–15.5)
WBC: 19.4 10*3/uL — ABNORMAL HIGH (ref 4.0–10.5)
nRBC: 0 % (ref 0.0–0.2)

## 2021-06-12 LAB — BASIC METABOLIC PANEL
Anion gap: 7 (ref 5–15)
BUN: 11 mg/dL (ref 6–20)
CO2: 33 mmol/L — ABNORMAL HIGH (ref 22–32)
Calcium: 8.1 mg/dL — ABNORMAL LOW (ref 8.9–10.3)
Chloride: 93 mmol/L — ABNORMAL LOW (ref 98–111)
Creatinine, Ser: 0.66 mg/dL (ref 0.61–1.24)
GFR, Estimated: >60 mL/min (ref >60)
Glucose, Bld: 72 mg/dL (ref 70–99)
Potassium: 4.2 mmol/L (ref 3.5–5.1)
Sodium: 133 mmol/L — ABNORMAL LOW (ref 135–145)

## 2021-06-12 LAB — TYPE AND SCREEN
ABO/RH(D): A POS
Antibody Screen: NEGATIVE

## 2021-06-12 LAB — GLUCOSE, CAPILLARY
Glucose-Capillary: 87 mg/dL (ref 70–99)
Glucose-Capillary: 85 mg/dL (ref 70–99)
Glucose-Capillary: 190 mg/dL — ABNORMAL HIGH (ref 70–99)
Glucose-Capillary: 125 mg/dL — ABNORMAL HIGH (ref 70–99)

## 2021-06-12 LAB — SURGICAL PCR SCREEN
MRSA, PCR: POSITIVE — AB
Staphylococcus aureus: POSITIVE — AB

## 2021-06-12 SURGERY — POSTERIOR CERVICAL FUSION/FORAMINOTOMY LEVEL 2
Anesthesia: General

## 2021-06-12 MED ORDER — LACTATED RINGERS IV SOLN: INTRAVENOUS | Status: DC

## 2021-06-12 MED ORDER — FENTANYL CITRATE (PF) 250 MCG/5ML IJ SOLN
INTRAMUSCULAR | Status: AC
  Filled 2021-06-12: qty 5

## 2021-06-12 MED ORDER — ROCURONIUM BROMIDE 10 MG/ML (PF) SYRINGE
PREFILLED_SYRINGE | INTRAVENOUS | Status: AC
  Filled 2021-06-12: qty 10

## 2021-06-12 MED ORDER — DEXAMETHASONE SODIUM PHOSPHATE 10 MG/ML IJ SOLN
INTRAMUSCULAR | Status: AC
  Filled 2021-06-12: qty 1

## 2021-06-12 MED ORDER — CHLORHEXIDINE GLUCONATE 0.12 % MT SOLN
OROMUCOSAL | Status: AC
  Administered 2021-06-12: 15 mL via OROMUCOSAL
  Filled 2021-06-12: qty 15

## 2021-06-12 MED ORDER — ONDANSETRON HCL 4 MG/2ML IJ SOLN
INTRAMUSCULAR | Status: AC
  Filled 2021-06-12: qty 2

## 2021-06-12 MED ORDER — PROPOFOL 10 MG/ML IV BOLUS
INTRAVENOUS | Status: AC
Start: 2021-06-12 — End: ?
  Filled 2021-06-12: qty 20

## 2021-06-12 MED ORDER — LIDOCAINE 2% (20 MG/ML) 5 ML SYRINGE
INTRAMUSCULAR | Status: AC
  Filled 2021-06-12: qty 5

## 2021-06-12 MED ORDER — CHLORHEXIDINE GLUCONATE CLOTH 2 % EX PADS
6.0000 | MEDICATED_PAD | Freq: Every day | CUTANEOUS | Status: DC
  Administered 2021-06-13 – 2021-06-15 (×3): 6 via TOPICAL

## 2021-06-12 MED ORDER — MIDAZOLAM HCL 2 MG/2ML IJ SOLN
INTRAMUSCULAR | Status: AC
  Filled 2021-06-12: qty 2

## 2021-06-12 MED ORDER — ORAL CARE MOUTH RINSE: 15.0000 mL | Freq: Once | OROMUCOSAL | Status: AC

## 2021-06-12 MED ORDER — CHLORHEXIDINE GLUCONATE 0.12 % MT SOLN
15.0000 mL | Freq: Once | OROMUCOSAL | Status: AC
  Filled 2021-06-12: qty 15

## 2021-06-12 MED ORDER — MUPIROCIN 2 % EX OINT
1.0000 "application " | TOPICAL_OINTMENT | Freq: Two times a day (BID) | CUTANEOUS | Status: DC
  Administered 2021-06-12 – 2021-06-15 (×6): 1 via NASAL
  Filled 2021-06-12 (×2): qty 22

## 2021-06-12 NOTE — Progress Notes (Signed)
Patient was brought down to the preoperative area for surgical planning.  He began having hypotensive episodes, systolic blood pressure in the 60s.  Unfortunately this has been somewhat sustained into the 70s as well.  In discussion with anesthesia and his significant medical issues concomitantly occurring, we decided to cancel the surgery.  His spine decompression is certainly not emergent.  We will send him back upstairs for further medical treatment.  I will follow him peripherally.  He will likely need to further recover from his current medical issues and be seen as an outpatient for further treatment for his cervical stenosis. ? ?I met with his brother who agrees with this and understands.  I answered all of his questions.  He states his brother has not walked in closer to 1 year. ? ? ?Thank you for allowing me to participate in this patient's care.  Please do not hesitate to call with questions or concerns. ? ? ?Mahkayla Preece, DO ?Neurosurgeon ?[facility] ?Cell: [phone] ? ?

## 2021-06-12 NOTE — TOC Progression Note (Addendum)
Transition of Care (TOC) - Progression Note  ? ? ?Patient Details  ?Name: Raymond [name] ?MRN: [id number] ?Date of Birth: 1960-04-09 ? ?Transition of Care (TOC) CM/SW Contact  ?Milas Gain, LCSWA ?Phone Number: ?06/12/2021, 4:31 PM ? ?Clinical Narrative:    ? ?Patient has SNF bed at [facility]. Follow up with Bryson Ha with [facility] rehabilitation tomorrow regarding restarting insurance authorization for patient.CSW will continue to follow and assist with patients dc planning needs. ? ?Expected Discharge Plan: [place] ?Barriers to Discharge: Continued Medical Work up ? ?Expected Discharge Plan and Services ?Expected Discharge Plan: [place] ?In-house Referral: Clinical Social Work ?  ?  ?Living arrangements for the past 2 months: [place] ?                ?  ?  ?  ?  ?  ?  ?  ?  ?  ?  ? ? ?Social Determinants of Health (SDOH) Interventions ?  ? ?Readmission Risk Interventions ? ?  12/13/2020  ?  2:02 PM  ?Readmission Risk Prevention Plan  ?Transportation Screening Complete  ?PCP or Specialist Appt within 5-7 Days Complete  ?Home Care Screening Complete  ?Medication Review (RN CM) Complete  ? ? ?

## 2021-06-12 NOTE — Assessment & Plan Note (Signed)
Replaced. °

## 2021-06-12 NOTE — Progress Notes (Signed)
Surgical PCR (+) MRSA and Staph. Standing orders initiated per protocol. Jessie Foot, RN  ?

## 2021-06-12 NOTE — Progress Notes (Addendum)
? ? ? [facility] ?                                                                            ? ? ?Raymond [name], is a 61 y.o. male, DOB - September 23, 1960, RXY:[id number] ?Admit date - 05/31/2021    ?Outpatient Primary MD for the patient is [name], MD ? ?LOS - 11  days ? ? ? ?Brief summary  ? ?Past medical history of CAD SP PCI, COPD, chronic respiratory failure, GERD, active smoker, PVD, type II DM.  Presents with complaints of chest pain and elevated troponin as well as COPD exacerbation. ?Cardiology was consulted.  Underwent cardiac catheterization on 3/24 which showed nonobstructive CAD.  Medical management recommended. Patient developed Malena, associated with hypotension and tachycardia.  ?GI consulted and he was started on PPI GTT and transferred to ICU for hypotension and possible pressors.  ?Pt did not require vasopressors and he was transferred to [facility] on 06/05/21.  ?Patient underwent EGD on 3/29 that showed large cratered ulcer in distal third of stomach ?Hemoglobin has been stable and respiratory status appears to be at baseline ?He complained of weakness and ataxia,  ?MRI brain shows small incidental infarct.  Seen by neurology with recommendations for dual antiplatelet therapy when cleared by neurosurgery/GI  ?MRI C spine shows severe spinal stenosis with mass effect on cord ?Seen by neurosurgery with plans for operative management on 4/4 ? ? ?Assessment & Plan  ? ? ?Assessment and Plan: ? ? ? ?GI bleed.  ?Likely upper GI bleed, ?EGD on 3/29 showed large cratered ulcer in distal third of stomach ?Continue PPI ?Discussed with GI and recommended to continue to hold aspirin and plavix for until at least 06/14/2021 ?Continue to follow hemoglobin ? ?Acute blood loss anemia ?-Secondary to GI bleeding ?-Status post 2 units PRBC ?-Continue to follow hemoglobin ? ?Retained gastric contents ?-May have component of gastroparesis since he is on chronic opiates ?-Appears to be tolerating Reglan ? ?Esophageal  candidiasis ?-Started on Diflucan ? ?Ataxia due to severe cervical stenosis and mass effect on cord ?-Patient reports significant/progressive bilateral upper extremity and lower extremity limb ataxia which has been progressing over the past 5 months, worse in the last 2 weeks where he has been unable to stand. He also endorses intermittent urinary incontience ?-Currently, patient has difficulty feeding himself/drinking out of a cup ?-He has difficulty walking with a walker since he is unable to support his weight on his arms ?-Etiology of symptoms is not entirely clear ?-MRI brain shows normal volume of cerebellum, but does comment on small acute infarct in posterior right frontal lobe (suspect incidental) ?-B12 360 ?-Neurology consulted and ordered MRI C/T spine which showed severe spinal stenosis with mass effect on cord ?-Seen by neurosurgery and plans are for operative management on 4/4 ?-PT/OT following, now recommending CIR, but patient electing to go to SNF ? ?Acute cerebral infarct ?-incidental finding on MRI brain ?-Seen by neurology ?-MRA head unremarkable ?-carotid dopplers without any significant stenosis bilaterally ?-Echocardiogram unremarkable ?-Per neuro, will need dual antiplatelet therapy with aspirin 81 mg daily and Plavix 75 mg daily x21 days followed by aspirin 81 mg daily ?-To start antiplatelet therapy when cleared with neurosurgery  as well as GI ? ?* Acute and chronic respiratory failure with hypoxia ([facility]) ?COPD with acute exacerbation secondary to community-acquired pneumonia. ?Uses oxygen as needed at home ?Completed the course of doxycycline as well as prednisone taper ?Continue with bronchodilators .  ? ?NSTEMI (non-ST elevated myocardial infarction) ([place]) ?HLD. ?HTN. ?Presented with complaints of chest tightness. ?Troponins were minimally elevated patient was started on IV heparin drip.  Cardiology considers this as non-STEMI. ?Underwent cardiac catheterization which shows nonobstructive  CAD. ?Echocardiogram performed, EF 55 to 60%. ?Continue with crestor, and anti platelet agents are on hold for GI bleed.  ?Cardiology currently signed off. ?Outpatient follow-up. ? 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 Abner Greenspan who felt it would be reasonable to have patient follow-up with urology in the next couple weeks with repeat CT abdomen with renal stone protocol ? ?Depression ?We will continue Wellbutrin XL. ? ?Chronic degenerative disc disease ?Reports having surgery on the lumbar spine as well as cervical spine in the past (between 10 to 20 years ago) ?Imaging indicates ACDF as well as lumbar fusion in the past ?His previous neurosurgeon, Dr. Carloyn Manner is now retired ? ? 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  Monitor. ? ?Protein-calorie malnutrition, severe ?Body mass index is 21.86 kg/m?[name] ?Nutrition Problem: Severe Malnutrition (in the context of chronic illness) ?Etiology: poor appetite ?Nutrition Interventions: ?Interventions: Refer to RD note for recommendations  ? ?Pressure injury of skin ?Present on admission. ?Left lower buttock. ?Wound care consulted. ?For right toe ulcer, cleanse right lateral foot wound with saline, pat dry. Place a small piece of Xeroform gauze over the wound, then dry gauze, secure with a few turns of kerlix. ?  ?For the left buttock/hip wound, cleanse with saline, place a small piece of xeroform gauze over the wound, and cover with a foam dressing. Change both every 2 days. ? ? ?Tobacco use ?Continue nicotine patch. ?Patient was counseled on tobacco cessation. ? ? ?Leukocytosis:  ?Possibly from prednisone.  ?Trending  down ? ?Hypokalemia ?Replaced ? ? ?RN Pressure Injury Documentation: ?Pressure Injury 05/31/21 Sacrum Medial Stage 1 -  Intact skin with non-blanchable redness of a localized area usually over a bony prominence. (Active)  ?05/31/21 2100  ?Location: Sacrum  ?Location Orientation: Medial  ?Staging: Stage 1 -  Intact skin with non-blanchable redness of a localized area usually over a bony prominence.  ?Wound Description (Comments):   ?Present on Admission: Yes  ?Dressing Type Foam - Lift dressing to assess site every shift 06/11/21 2045  ?   ?Pressure Injury 05/31/21 Ischial tuberosity Left Stage 2 -  Partial thickness loss of dermis presenting as a shallow open injury with a red, pink wound bed without slough. (Active)  ?05/31/21 2300  ?Location: Ischial tuberosity  ?Location Orientation: Left  ?Staging: Stage 2 -  Partial thickness loss of dermis presenting as a shallow open injury with a red, pink wound bed without slough.  ?Wound Description (Comments):   ?Present on Admission: Yes  ?Dressing Type Other (Comment) 06/11/21 0537  ? ? ?Malnutrition Type: ? ?Nutrition Problem: Severe Malnutrition (in the context of chronic illness) ?Etiology: poor appetite ? ? ?Malnutrition Characteristics: ? ?Signs/Symptoms: severe fat depletion, severe muscle depletion, percent weight loss (20.6% x  11.5 months) ?Percent weight loss: 20.6 % ? ? ?Nutrition Interventions: ? ?Interventions: Refer to RD note for recommendations ? ?Estimated body mass index is 21.86 kg/m? as calculated from the following: ?  Height as of this encounter: '5\' 7"'$  (1.702 m). ?  Weight as of this encounter: 63.3 kg. ? ?Code Status: full code.  ?DVT Prophylaxis:  Place and maintain sequential compression device Start: 06/05/21 1735 ? ? ?Level of Care: Level of care: Med-Surg ?Family Communication: discussed with patient ? ?Disposition Plan:     Remains inpatient appropriate: Needs further operative management of spinal stenosis ? ?Procedures:  ?3/24: Cardiac  cath: Nonobstructive coronary disease ?3/24 Echo: EF 55:60%, no WMA ?3/29 EGD: candidal esophagitis, large non-obstructing, cratered ulcer occupying distal 1/3 of stomach in pre-pyloric region with patch

## 2021-06-12 NOTE — Anesthesia Preprocedure Evaluation (Signed)
Anesthesia Evaluation  ?Patient identified by MRN, date of birth, ID band ?Patient awake ? ? ? ?Reviewed: ?Allergy & Precautions, H&P , NPO status , Patient's Chart, lab work & pertinent test results ? ?Airway ?Mallampati: II ? ? ?Neck ROM: full ? ? ? Dental ?  ?Pulmonary ?COPD, Current Smoker and Patient abstained from smoking.,  ?  ?breath sounds clear to auscultation ? ? ? ? ? ? Cardiovascular ?+ Past MI, + Cardiac Stents and + Peripheral Vascular Disease  ? ?Rhythm:regular Rate:Normal ? ? ?  ?Neuro/Psych ? Headaches, PSYCHIATRIC DISORDERS Anxiety Depression Bipolar Disorder CVA   ? GI/Hepatic ?GERD  ,  ?Endo/Other  ?diabetes ? Renal/GU ?  ? ?  ?Musculoskeletal ? ?(+) Arthritis ,  ? Abdominal ?  ?Peds ? Hematology ? ?(+) Blood dyscrasia, anemia ,   ?Anesthesia Other Findings ? ? Reproductive/Obstetrics ? ?  ? ? ? ? ? ? ? ? ? ? ? ? ? ?  ?  ? ? ? ? ? ? ? ? ?Anesthesia Physical ?Anesthesia Plan ? ?ASA: 3 ? ?Anesthesia Plan: General  ? ?Post-op Pain Management:   ? ?Induction: Intravenous ? ?PONV Risk Score and Plan: 1 and Ondansetron, Dexamethasone, Treatment may vary due to age or medical condition and Midazolam ? ?Airway Management Planned: Oral ETT ? ?Additional Equipment:  ? ?Intra-op Plan:  ? ?Post-operative Plan: Extubation in OR ? ?Informed Consent: I have reviewed the patients History and Physical, chart, labs and discussed the procedure including the risks, benefits and alternatives for the proposed anesthesia with the patient or authorized representative who has indicated his/her understanding and acceptance.  ? ? ? ?Dental advisory given ? ?Plan Discussed with: CRNA, Anesthesiologist and Surgeon ? ?Anesthesia Plan Comments:   ? ? ? ? ? ? ?Anesthesia Quick Evaluation ? ?

## 2021-06-12 NOTE — Progress Notes (Signed)
Pt is A&O x4, BP low upon arriving to pre-op. Dr Marcie Bal notified and came to see the pt. IVF bolus of 365m given. Dr Dawley came in and seen pt. Surgery is cancelled for today. Latest BP 99/52. [facility] nurse notified of the event and cancellation. RRT SMenlonotified and came in, transported pt back to [facility] OR notified. ?

## 2021-06-12 NOTE — Progress Notes (Signed)
OT Cancellation Note ? ?Patient Details ?Name: Raymond [name] ?MRN: [id number] ?DOB: 09/11/1960 ? ? ?Cancelled Treatment:    Reason Eval/Treat Not Completed: Patient at procedure or test/ unavailable Off unit for surgery. Will follow-up tomorrow for OT re-evaluation.  ? ?Layla Maw ?06/12/2021, 12:02 PM ?

## 2021-06-13 DIAGNOSIS — J9621 Acute and chronic respiratory failure with hypoxia: Secondary | ICD-10-CM | POA: Diagnosis not present

## 2021-06-13 DIAGNOSIS — I959 Hypotension, unspecified: Secondary | ICD-10-CM | POA: Diagnosis present

## 2021-06-13 DIAGNOSIS — B3781 Candidal esophagitis: Secondary | ICD-10-CM | POA: Diagnosis present

## 2021-06-13 DIAGNOSIS — K250 Acute gastric ulcer with hemorrhage: Secondary | ICD-10-CM | POA: Diagnosis present

## 2021-06-13 LAB — CBC
HCT: 29.6 % — ABNORMAL LOW (ref 39.0–52.0)
HCT: 29 % — ABNORMAL LOW (ref 39.0–52.0)
Hemoglobin: 9.2 g/dL — ABNORMAL LOW (ref 13.0–17.0)
Hemoglobin: 8.7 g/dL — ABNORMAL LOW (ref 13.0–17.0)
MCH: 25.6 pg — ABNORMAL LOW (ref 26.0–34.0)
MCH: 25.1 pg — ABNORMAL LOW (ref 26.0–34.0)
MCHC: 31.1 g/dL (ref 30.0–36.0)
MCHC: 30 g/dL (ref 30.0–36.0)
MCV: 82.2 fL (ref 80.0–100.0)
MCV: 83.6 fL (ref 80.0–100.0)
Platelets: 349 10*3/uL (ref 150–400)
Platelets: 322 10*3/uL (ref 150–400)
RBC: 3.6 MIL/uL — ABNORMAL LOW (ref 4.22–5.81)
RBC: 3.47 MIL/uL — ABNORMAL LOW (ref 4.22–5.81)
RDW: 25.8 % — ABNORMAL HIGH (ref 11.5–15.5)
RDW: 25.6 % — ABNORMAL HIGH (ref 11.5–15.5)
WBC: 22.4 10*3/uL — ABNORMAL HIGH (ref 4.0–10.5)
WBC: 16.5 10*3/uL — ABNORMAL HIGH (ref 4.0–10.5)
nRBC: 0 % (ref 0.0–0.2)
nRBC: 0 % (ref 0.0–0.2)

## 2021-06-13 LAB — COMPREHENSIVE METABOLIC PANEL
ALT: 23 U/L (ref 0–44)
AST: 19 U/L (ref 15–41)
Albumin: 1.7 g/dL — ABNORMAL LOW (ref 3.5–5.0)
Alkaline Phosphatase: 90 U/L (ref 38–126)
Anion gap: 4 — ABNORMAL LOW (ref 5–15)
BUN: 13 mg/dL (ref 6–20)
CO2: 34 mmol/L — ABNORMAL HIGH (ref 22–32)
Calcium: 7.9 mg/dL — ABNORMAL LOW (ref 8.9–10.3)
Chloride: 93 mmol/L — ABNORMAL LOW (ref 98–111)
Creatinine, Ser: 0.73 mg/dL (ref 0.61–1.24)
GFR, Estimated: >60 mL/min (ref >60)
Glucose, Bld: 98 mg/dL (ref 70–99)
Potassium: 4.1 mmol/L (ref 3.5–5.1)
Sodium: 131 mmol/L — ABNORMAL LOW (ref 135–145)
Total Bilirubin: <0.1 mg/dL — ABNORMAL LOW (ref 0.3–1.2)
Total Protein: 5 g/dL — ABNORMAL LOW (ref 6.5–8.1)

## 2021-06-13 LAB — PROCALCITONIN: Procalcitonin: <0.1 ng/mL

## 2021-06-13 LAB — CORTISOL: Cortisol, Plasma: 15.6 ug/dL

## 2021-06-13 LAB — GLUCOSE, CAPILLARY
Glucose-Capillary: 118 mg/dL — ABNORMAL HIGH (ref 70–99)
Glucose-Capillary: 201 mg/dL — ABNORMAL HIGH (ref 70–99)
Glucose-Capillary: 146 mg/dL — ABNORMAL HIGH (ref 70–99)
Glucose-Capillary: 206 mg/dL — ABNORMAL HIGH (ref 70–99)

## 2021-06-13 LAB — LACTIC ACID, PLASMA: Lactic Acid, Venous: 2.2 mmol/L (ref 0.5–1.9)

## 2021-06-13 MED ORDER — MIDODRINE HCL 5 MG PO TABS
2.5000 mg | ORAL_TABLET | Freq: Three times a day (TID) | ORAL | Status: DC
  Administered 2021-06-13 – 2021-06-15 (×6): 2.5 mg via ORAL
  Filled 2021-06-13 (×6): qty 1

## 2021-06-13 MED ORDER — ALBUMIN HUMAN 5 % IV SOLN
12.5000 g | Freq: Once | INTRAVENOUS | Status: AC
  Administered 2021-06-13: 12.5 g via INTRAVENOUS
  Filled 2021-06-13: qty 250

## 2021-06-13 MED ORDER — SODIUM CHLORIDE 0.9 % IV BOLUS
500.0000 mL | Freq: Once | INTRAVENOUS | Status: AC
  Administered 2021-06-13: 500 mL via INTRAVENOUS

## 2021-06-13 MED ORDER — COSYNTROPIN 0.25 MG IJ SOLR
0.2500 mg | Freq: Once | INTRAMUSCULAR | Status: AC
  Administered 2021-06-14: 0.25 mg via INTRAVENOUS
  Filled 2021-06-13: qty 0.25

## 2021-06-13 NOTE — Assessment & Plan Note (Addendum)
Possible shock. ?Multifactorial in the setting of COPD exacerbation, acute blood loss anemia in the setting of GI bleed as well as polypharmacy. ?Lactic acid minimally elevated. ?Patient had 1 episode of hypotension requiring ICU transfer when he was identified with GI bleed and another episode of hypotension preoperatively in the PACU.  ?Echocardiogram this admission shows preserved EF. ?Patient does have leukocytosis but no other evidence of acute infection.  Procalcitonin level is negative. ?Hemoglobin is relatively stable. ?Treated with IV fluid and IV albumin x1. ?Continue midodrine. ?Check cosyntropin stimulation test.  Unable to perform it on 4/6 due to error by staff. ?

## 2021-06-13 NOTE — Assessment & Plan Note (Addendum)
Esophageal candidiasis. ?Patient developed acute GI bleed with hypotension on 3/26. ?Underwent EGD on 3/29. ?Found to have a large gastric ulcer with necrosis. ?Also found to have esophageal candidiasis on Diflucan. ?Pathology showed benign gastric ulcer without dysplasia or malignancy. ?No plan for repeat endoscopic evaluation during this hospitalization. ?Outpatient follow-up with Dr. Mann/Dr. Benson Norway in 3 months. ?GI recommend to avoid the use of all NSAIDs. ?Continue PPI postoperatively twice daily. ?

## 2021-06-13 NOTE — TOC Progression Note (Signed)
Transition of Care (TOC) - Progression Note  ? ? ?Patient Details  ?Name: Raymond [name] ?MRN: [id number] ?Date of Birth: Jan 01, 1961 ? ?Transition of Care (TOC) CM/SW Contact  ?Milas Gain, LCSWA ?Phone Number: ?06/13/2021, 10:10 AM ? ?Clinical Narrative:    ? ?CSW spoke with Bryson Ha with [facility] who confirmed she will restart insurance authorization for patient. CSW informed PT. CSW will continue to follow and assist with patients dc planning needs. ? ?Expected Discharge Plan: [place] ?Barriers to Discharge: Continued Medical Work up ? ?Expected Discharge Plan and Services ?Expected Discharge Plan: [name] ?In-house Referral: Clinical Social Work ?  ?  ?Living arrangements for the past 2 months: [place] ?                ?  ?  ?  ?  ?  ?  ?  ?  ?  ?  ? ? ?Social Determinants of Health (SDOH) Interventions ?  ? ?Readmission Risk Interventions ? ?  12/13/2020  ?  2:02 PM  ?Readmission Risk Prevention Plan  ?Transportation Screening Complete  ?PCP or Specialist Appt within 5-7 Days Complete  ?Home Care Screening Complete  ?Medication Review (RN CM) Complete  ? ? ?

## 2021-06-13 NOTE — Plan of Care (Signed)

## 2021-06-13 NOTE — Plan of Care (Signed)
?  Problem: Education: ?Goal: Knowledge of General Education information will improve ?Description: Including pain rating scale, medication(s)/side effects and non-pharmacologic comfort measures ?Outcome: Progressing ?  ?Problem: Health Behavior/Discharge Planning: ?Goal: Ability to manage health-related needs will improve ?Outcome: Progressing ?  ?Problem: Clinical Measurements: ?Goal: Ability to maintain clinical measurements within normal limits will improve ?Outcome: Progressing ?Goal: Will remain free from infection ?Outcome: Progressing ?Goal: Diagnostic test results will improve ?Outcome: Progressing ?Goal: Respiratory complications will improve ?Outcome: Progressing ?Goal: Cardiovascular complication will be avoided ?Outcome: Progressing ?  ?Problem: Activity: ?Goal: Risk for activity intolerance will decrease ?Outcome: Progressing ?  ?Problem: Nutrition: ?Goal: Adequate nutrition will be maintained ?Outcome: Progressing ?  ?Problem: Coping: ?Goal: Level of anxiety will decrease ?Outcome: Progressing ?  ?Problem: Elimination: ?Goal: Will not experience complications related to bowel motility ?Outcome: Progressing ?  ?Problem: Pain Managment: ?Goal: General experience of comfort will improve ?Outcome: Progressing ?  ?Problem: Safety: ?Goal: Ability to remain free from injury will improve ?Outcome: Progressing ?  ?Problem: Skin Integrity: ?Goal: Risk for impaired skin integrity will decrease ?Outcome: Progressing ?  ?Problem: Consults ?Goal: RH STROKE PATIENT EDUCATION ?Description: See Patient Education module for education specifics  ?Outcome: Progressing ?Goal: Nutrition Consult-if indicated ?Outcome: Progressing ?Goal: Diabetes Guidelines if Diabetic/Glucose > 140 ?Description: If diabetic or lab glucose is > 140 mg/dl - Initiate Diabetes/Hyperglycemia Guidelines & Document Interventions  ?Outcome: Progressing ?  ?Problem: RH BOWEL ELIMINATION ?Goal: RH STG MANAGE BOWEL WITH ASSISTANCE ?Description: STG  Manage Bowel with Assistance. ?Outcome: Progressing ?Goal: RH STG MANAGE BOWEL W/MEDICATION W/ASSISTANCE ?Description: STG Manage Bowel with Medication with Assistance. ?Outcome: Progressing ?  ?Problem: RH BLADDER ELIMINATION ?Goal: RH STG MANAGE BLADDER WITH ASSISTANCE ?Description: STG Manage Bladder With Assistance ?Outcome: Progressing ?Goal: RH STG MANAGE BLADDER WITH MEDICATION WITH ASSISTANCE ?Description: STG Manage Bladder With Medication With Assistance. ?Outcome: Progressing ?Goal: RH STG MANAGE BLADDER WITH EQUIPMENT WITH ASSISTANCE ?Description: STG Manage Bladder With Equipment With Assistance ?Outcome: Progressing ?  ?Problem: RH SKIN INTEGRITY ?Goal: RH STG SKIN FREE OF INFECTION/BREAKDOWN ?Outcome: Progressing ?Goal: RH STG MAINTAIN SKIN INTEGRITY WITH ASSISTANCE ?Description: STG Maintain Skin Integrity With Assistance. ?Outcome: Progressing ?Goal: RH STG ABLE TO PERFORM INCISION/WOUND CARE W/ASSISTANCE ?Description: STG Able To Perform Incision/Wound Care With Assistance. ?Outcome: Progressing ?  ?Problem: RH SAFETY ?Goal: RH STG ADHERE TO SAFETY PRECAUTIONS W/ASSISTANCE/DEVICE ?Description: STG Adhere to Safety Precautions With Assistance/Device. ?Outcome: Progressing ?Goal: RH STG DECREASED RISK OF FALL WITH ASSISTANCE ?Description: STG Decreased Risk of Fall With Assistance. ?Outcome: Progressing ?  ?Problem: RH COGNITION-NURSING ?Goal: RH STG USES MEMORY AIDS/STRATEGIES W/ASSIST TO PROBLEM SOLVE ?Description: STG Uses Memory Aids/Strategies With Assistance to Problem Solve. ?Outcome: Progressing ?Goal: RH STG ANTICIPATES NEEDS/CALLS FOR ASSIST W/ASSIST/CUES ?Description: STG Anticipates Needs/Calls for Assist With Assistance/Cues. ?Outcome: Progressing ?  ?Problem: RH PAIN MANAGEMENT ?Goal: RH STG PAIN MANAGED AT OR BELOW PT'S PAIN GOAL ?Outcome: Progressing ?  ?Problem: RH KNOWLEDGE DEFICIT ?Goal: RH STG INCREASE KNOWLEDGE OF DIABETES ?Outcome: Progressing ?Goal: RH STG INCREASE KNOWLEDGE OF  HYPERTENSION ?Outcome: Progressing ?Goal: RH STG INCREASE KNOWLEDGE OF DYSPHAGIA/FLUID INTAKE ?Outcome: Progressing ?Goal: RH STG INCREASE KNOWLEGDE OF HYPERLIPIDEMIA ?Outcome: Progressing ?Goal: RH STG INCREASE KNOWLEDGE OF STROKE PROPHYLAXIS ?Outcome: Progressing ?  ?

## 2021-06-13 NOTE — Plan of Care (Signed)
?Problem: Education: ?Goal: Knowledge of General Education information will improve ?Description: Including pain rating scale, medication(s)/side effects and non-pharmacologic comfort measures ?06/13/2021 0536 by Robley Fries, RN ?Outcome: Progressing ?06/13/2021 0053 by Robley Fries, RN ?Outcome: Progressing ?  ?Problem: Health Behavior/Discharge Planning: ?Goal: Ability to manage health-related needs will improve ?06/13/2021 0536 by Robley Fries, RN ?Outcome: Progressing ?06/13/2021 0053 by Robley Fries, RN ?Outcome: Progressing ?  ?Problem: Clinical Measurements: ?Goal: Ability to maintain clinical measurements within normal limits will improve ?06/13/2021 0536 by Robley Fries, RN ?Outcome: Progressing ?06/13/2021 0053 by Robley Fries, RN ?Outcome: Progressing ?Goal: Will remain free from infection ?06/13/2021 0536 by Robley Fries, RN ?Outcome: Progressing ?06/13/2021 0053 by Robley Fries, RN ?Outcome: Progressing ?Goal: Diagnostic test results will improve ?06/13/2021 0536 by Robley Fries, RN ?Outcome: Progressing ?06/13/2021 0053 by Robley Fries, RN ?Outcome: Progressing ?Goal: Respiratory complications will improve ?06/13/2021 0536 by Robley Fries, RN ?Outcome: Progressing ?06/13/2021 0053 by Robley Fries, RN ?Outcome: Progressing ?Goal: Cardiovascular complication will be avoided ?06/13/2021 0536 by Robley Fries, RN ?Outcome: Progressing ?06/13/2021 0053 by Robley Fries, RN ?Outcome: Progressing ?  ?Problem: Activity: ?Goal: Risk for activity intolerance will decrease ?06/13/2021 0536 by Robley Fries, RN ?Outcome: Progressing ?06/13/2021 0053 by Robley Fries, RN ?Outcome: Progressing ?  ?Problem: Nutrition: ?Goal: Adequate nutrition will be maintained ?06/13/2021 0536 by Robley Fries, RN ?Outcome: Progressing ?06/13/2021 0053 by Robley Fries, RN ?Outcome: Progressing ?  ?Problem: Coping: ?Goal: Level of anxiety will decrease ?06/13/2021 0536 by Robley Fries, RN ?Outcome:  Progressing ?06/13/2021 0053 by Robley Fries, RN ?Outcome: Progressing ?  ?Problem: Elimination: ?Goal: Will not experience complications related to bowel motility ?06/13/2021 0536 by Robley Fries, RN ?Outcome: Progressing ?06/13/2021 0053 by Robley Fries, RN ?Outcome: Progressing ?  ?Problem: Pain Managment: ?Goal: General experience of comfort will improve ?06/13/2021 0536 by Robley Fries, RN ?Outcome: Progressing ?06/13/2021 0053 by Robley Fries, RN ?Outcome: Progressing ?  ?Problem: Safety: ?Goal: Ability to remain free from injury will improve ?06/13/2021 0536 by Robley Fries, RN ?Outcome: Progressing ?06/13/2021 0053 by Robley Fries, RN ?Outcome: Progressing ?  ?Problem: Skin Integrity: ?Goal: Risk for impaired skin integrity will decrease ?06/13/2021 0536 by Robley Fries, RN ?Outcome: Progressing ?06/13/2021 0053 by Robley Fries, RN ?Outcome: Progressing ?  ?Problem: Consults ?Goal: RH STROKE PATIENT EDUCATION ?Description: See Patient Education module for education specifics  ?Outcome: Progressing ?Goal: Nutrition Consult-if indicated ?Outcome: Progressing ?Goal: Diabetes Guidelines if Diabetic/Glucose > 140 ?Description: If diabetic or lab glucose is > 140 mg/dl - Initiate Diabetes/Hyperglycemia Guidelines & Document Interventions  ?Outcome: Progressing ?  ?Problem: RH BOWEL ELIMINATION ?Goal: RH STG MANAGE BOWEL WITH ASSISTANCE ?Description: STG Manage Bowel with Assistance. ?Outcome: Progressing ?Goal: RH STG MANAGE BOWEL W/MEDICATION W/ASSISTANCE ?Description: STG Manage Bowel with Medication with Assistance. ?Outcome: Progressing ?  ?Problem: RH BLADDER ELIMINATION ?Goal: RH STG MANAGE BLADDER WITH ASSISTANCE ?Description: STG Manage Bladder With Assistance ?Outcome: Progressing ?Goal: RH STG MANAGE BLADDER WITH MEDICATION WITH ASSISTANCE ?Description: STG Manage Bladder With Medication With Assistance. ?Outcome: Progressing ?Goal: RH STG MANAGE BLADDER WITH EQUIPMENT WITH  ASSISTANCE ?Description: STG Manage Bladder With Equipment With Assistance ?Outcome: Progressing ?  ?Problem: RH SKIN INTEGRITY ?Goal: RH STG SKIN FREE OF INFECTION/BREAKDOWN ?Outcome: Progressing ?Goal: RH STG MAINTAIN SKIN INTEGRITY WITH ASSISTANCE ?Description: STG Maintain Skin Integrity With Assistance. ?Outcome: Progressing ?Goal: RH STG ABLE TO PERFORM INCISION/WOUND CARE  W/ASSISTANCE ?Description: STG Able To Perform Incision/Wound Care With Assistance. ?Outcome: Progressing ?  ?Problem: RH SAFETY ?Goal: RH STG ADHERE TO SAFETY PRECAUTIONS W/ASSISTANCE/DEVICE ?Description: STG Adhere to Safety Precautions With Assistance/Device. ?Outcome: Progressing ?Goal: RH STG DECREASED RISK OF FALL WITH ASSISTANCE ?Description: STG Decreased Risk of Fall With Assistance. ?Outcome: Progressing ?  ?Problem: RH COGNITION-NURSING ?Goal: RH STG USES MEMORY AIDS/STRATEGIES W/ASSIST TO PROBLEM SOLVE ?Description: STG Uses Memory Aids/Strategies With Assistance to Problem Solve. ?Outcome: Progressing ?Goal: RH STG ANTICIPATES NEEDS/CALLS FOR ASSIST W/ASSIST/CUES ?Description: STG Anticipates Needs/Calls for Assist With Assistance/Cues. ?Outcome: Progressing ?  ?Problem: RH PAIN MANAGEMENT ?Goal: RH STG PAIN MANAGED AT OR BELOW PT'S PAIN GOAL ?Outcome: Progressing ?  ?Problem: RH KNOWLEDGE DEFICIT ?Goal: RH STG INCREASE KNOWLEDGE OF DIABETES ?Outcome: Progressing ?Goal: RH STG INCREASE KNOWLEDGE OF HYPERTENSION ?Outcome: Progressing ?Goal: RH STG INCREASE KNOWLEDGE OF DYSPHAGIA/FLUID INTAKE ?Outcome: Progressing ?Goal: RH STG INCREASE KNOWLEGDE OF HYPERLIPIDEMIA ?Outcome: Progressing ?Goal: RH STG INCREASE KNOWLEDGE OF STROKE PROPHYLAXIS ?Outcome: Progressing ?  ?

## 2021-06-13 NOTE — Assessment & Plan Note (Signed)
Based on the discussion with urology Dr. Abner Greenspan outpatient follow-up recommended with repeat CT renal stone protocol. ?

## 2021-06-13 NOTE — Progress Notes (Signed)
?Progress Note ? ? ?Patient: Raymond [name] HTD:[id number] DOB: [birth year] DOA: 05/31/2021     Hospitalization day: 12 ?DOS: the patient was seen and examined on 06/13/2021 ? ?Brief hospital course: ?Past medical history of CAD SP PCI, COPD, chronic respiratory failure, GERD, active smoker, PVD, type II DM.  Presents with complaints of chest pain and elevated troponin as well as COPD exacerbation. ?Cardiology was consulted.  Underwent cardiac catheterization on 3/24 which showed nonobstructive CAD.  Medical management recommended. Patient developed Malena, associated with hypotension and tachycardia.  ?GI consulted and he was started on PPI GTT and transferred to ICU for hypotension and possible pressors.  ?Pt did not require vasopressors and he was transferred to [facility] on 06/05/21.  ?Patient underwent EGD on 3/29 that showed large cratered ulcer in distal third of stomach ?Hemoglobin has been stable and respiratory status appears to be at baseline ?He complained of weakness and ataxia,  ?MRI brain shows small incidental infarct.  Seen by neurology with recommendations for dual antiplatelet therapy when cleared by neurosurgery/GI  ?MRI C spine shows severe spinal stenosis with mass effect on cord ?Seen by neurosurgery with plans for operative management on 4/4 which was consulted to preop hypotension. ? ?Assessment and Plan: ?* Acute and chronic respiratory failure with hypoxia ([place]) ?COPD with acute exacerbation secondary to community-acquired pneumonia. ?Patient uses 2 L of oxygen at home. ?Was requiring 4 to 5 L of oxygen.  Now able to be weaned off to 3 L of oxygen. ?Chest x-ray shows evidence of left lower lobe pneumonia. ?No cultures performed on admission.   ?He completed a course of doxycycline  ?Continue nebulizer therapy as well as steroids. ?Respiratory status appears to be approaching baseline ? ?Hypotension ?Possible shock. ?Multifactorial in the setting of COPD exacerbation, acute blood loss anemia in the  setting of GI bleed as well as polypharmacy. ?Lactic acid minimally elevated. ?Patient had 1 episode of hypotension requiring ICU transfer when he was identified with GI bleed and another episode of hypotension preoperatively in the PACU.  ?Echocardiogram in early hospital days shows preserved EF. ?Patient does have leukocytosis but no other evidence of acute infection.  Procalcitonin level is negative. ?Hemoglobin is relatively stable. ?We will check cosyntropin stimulation test. ?Add midodrine. ?Treated with IV fluid and IV albumin x1. ? ? ?NSTEMI (non-ST elevated myocardial infarction) ([place]) ?HLD. ?HTN. ?Initially presented with complaints of chest tightness. ?Troponins were minimally elevated patient was started on IV heparin drip.  Underwent cardiac catheterization which shows nonobstructive CAD. ?Echocardiogram performed, EF 55 to 60%. ?Due to GI bleed patient is currently off of aspirin and Plavix. ?Due to hypotension patient currently off of beta-blockers. ?Continue 40 mg daily Crestor (Crestor dose increased from 10 mg). ?Cardiology currently signed off. ?Outpatient follow-up. ? ?Bleeding acute gastric ulcer ?Patient developed acute GI bleed with hypotension on 3/26. ?Underwent EGD on 3/29. ?Found to have a large gastric ulcer with necrosis. ?GI recommend to avoid the use of all NSAIDs. ?Continue PPI postoperatively twice daily. ?Also found to have esophageal candidiasis on Diflucan. ?No plan for repeat endoscopic evaluation during this hospitalization unless overt bleeding noted. ?Outpatient follow-up with Dr. Mann/Dr. Benson Norway in 3 months. ?Pathology showed benign gastric ulcer without dysplasia or malignancy. ? ? ?Acute blood loss anemia ?Baseline hemoglobin around 10. ?status post 2 units PRBC ?Secondary to GI bleeding ?Continue to follow hemoglobin ? ?Cervical spinal stenosis ?History of lumbar spinal stenosis as well. ?Neurosurgery was consulted due to patient's complaints of weakness and for further  work-up for acute stroke. ?-Noted to have severe spinal stenosis with mass effect on cord C2-C3 and C3-C4 ?-Neurosurgery consulted patient was recommended to undergo C2-C4 posterior cervical decompression and fusion. ?Perioperatively patient becomes severely hypotensive requiring cancellation of the surgery. ?Currently neurosurgery recommends outpatient surgery once the patient recovers from his acute medical issues. ? ?CVA (cerebral vascular accident) [facility]) ?Incidental finding on MRI brain of small acute infarct in right posterior frontal lobe ?-Neurology consulted ?Continue statin.  LDL at goal.  Ideally patient would be on 81 mg aspirin and 75 mg Plavix for 21 days followed by 80 mg aspirin but currently on hold due to GI bleed. ? ?Hydronephrosis, right ?Based on the discussion with urology Dr. Abner Greenspan outpatient follow-up recommended with repeat CT renal stone protocol. ? 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  Monitor. ? ?Depression ?We will continue Wellbutrin XL. ? ?Hypokalemia ?Replaced ? ?Protein-calorie malnutrition, severe ?Body mass index is 21.86 kg/m?[name] ?Nutrition Problem: Severe Malnutrition (in the context of chronic illness) ?Etiology: poor appetite ?Nutrition Interventions: ?Interventions: Refer to RD note for recommendations  ? ?Pressure injury of skin ?Present on admission. .  Stage I sacral and stage II Left lower buttock ?Continue foam dressing.   ? ?Tobacco use ?Continue nicotine patch. ?Patient was counseled on tobacco cessation. ? ?Subjective: Continues to have some shortness of breath and cough.  No nausea no vomiting. ? ?Physical Exam: ?Vitals:  ? 06/13/21 0902 06/13/21 1201 06/13/21 1531 06/13/21 1630  ?BP:  (!) 81/53 100/75 (!) 108/55  ?Pulse: 88 77 61 87  ?Resp: '19 14 17 14  '$ ?Temp:      ?TempSrc:      ?SpO2: 99% 99% 97% 100%  ?Weight:      ?Height:      ? ?General: Appear in moderate distress; no visible Abnormal Neck Mass Or lumps,  Conjunctiva normal ?Cardiovascular: S1 and S2 Present, no Murmur, ?Respiratory: increased respiratory effort, Bilateral Air entry present and  faint Crackles, Occasional wheezes ?Abdomen: Bowel Sound present, Non tender ?Extremities: no Pedal edema ?Neurology: alert and oriented to time, place, and person ?Gait not checked due to patient safety concerns  ? ?Data Reviewed: ?I have Reviewed nursing notes, Vitals, and Lab results since pt's last encounter. Pertinent lab results CBC and BMP ?I have ordered test including CBC, BMP, cosyntropin stimulation test, procalcitonin, lactic acid level ?I have reviewed the last note from neurosurgery, GI, neurology,   ? ?Family Communication: None at bedside ? ?Disposition: ?Status is: Inpatient ?Remains inpatient appropriate because: Ongoing issues with hypotension requiring aggressive intervention.  Change back to telemetry. ? ?Author: ?Berle Mull, MD ?06/13/2021 6:19 PM ? ?For on call review [email or web address]. ?

## 2021-06-13 NOTE — Progress Notes (Signed)
Physical Therapy Treatment ?Patient Details ?Name: Raymond [name] ?MRN: [id number] ?DOB: [birth year] ?Today's Date: 06/13/2021 ? ? ?History of Present Illness 61 yo admitted 3/23 with chest pain. 3/24 pt s/p cardiac cath with coffee ground emesis and respiratory failure. 3/29 EGD.  Cervical decompression sx cancelled due to low BP.  Not rescheduled at this time. 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 ?  ?PT Comments  ? ? Pt demos decreased BP throughout tx session but denies any dizziness, only c/o fatigue with activity limiting mobilization to EOB only.  Orthostatic vitals (see below) are taken in supine/sitting only as pt states he cannot tolerate standing at this time.  Pt continues to demo significant strength deficits in UE/LE's affecting mobility and ACDF has not yet been rescheduled.  Pt would benefit from SNF placement upon D/C for safety while awaiting surgery.  ?Recommendations for follow up therapy are one component of a multi-disciplinary discharge planning process, led by the attending physician.  Recommendations may be updated based on patient status, additional functional criteria and insurance authorization. ? ?Follow Up Recommendations ? Skilled nursing-short term rehab (<3 hours/day) ?  ?  ?Assistance Recommended at Discharge Frequent or constant Supervision/Assistance  ?Patient can return home with the following Assistance with cooking/housework;A lot of help with bathing/dressing/bathroom;Assist for transportation;Two people to help with walking and/or transfers;Direct supervision/assist for medications management;Help with stairs or ramp for entrance ?  ?Equipment Recommendations ? Wheelchair (measurements PT);Wheelchair cushion (measurements PT)  ?  ?Recommendations for Other Services   ? ? ?  ?Precautions / Restrictions Precautions ?Precautions: Fall;Cervical ?Precaution Comments: Cord compression c-spine  ?  ? ?Mobility ? Bed Mobility ?Overal  bed mobility: Needs Assistance ?Bed Mobility: Sidelying to Sit, Rolling, Sit to Sidelying ?Rolling: Mod assist ?Sidelying to sit: Mod assist ?  ?  ?Sit to sidelying: Max assist ?General bed mobility comments: Pt requires mod A to roll to R side for PT to complete pericare.  Total A for pericare, mod A to remain in sidelying due to pain.  Pt. transfers to EOB with mod A for trunk control.  Demos increased lean to L side which pt states improve buttocks pain.  Sitting orthostatic noted to be 74/56 upon initial sitting, 72/55 after 8 min of sitting.  Pt reports being too fatigued to attempt standing/transfers.  Attempts to side scoot in bed but demos inc difficulty and is unsuccessful in advancing pelvis.  With increased weightshift forward and support at pelvis pt is able to advance pelvis to side scoot.  Pt. transfers BTB with max A to transition to sidelying for B LE negotiation. ?Patient Response: Cooperative ? ?Transfers ?  ?  ?  ?  ?  ?  ?  ?  ?  ?  ?  ? ?Ambulation/Gait ?  ?  ?  ?  ?  ?  ?  ?  ? ? ?Stairs ?  ?  ?  ?  ?  ? ? ?Wheelchair Mobility ?  ? ?Modified Rankin (Stroke Patients Only) ?  ? ? ?  ?Balance Overall balance assessment: Needs assistance ?  ?Sitting balance-Leahy Scale: Poor ?Sitting balance - Comments: Pt able to maintain sitting balance with UE support and feet supported on floor, pronounced L sided trunk lean. ?  ?  ?  ?  ?  ?  ?  ?  ?  ?  ?  ?  ?  ?  ?  ?  ? ?  ?  Cognition Arousal/Alertness: Awake/alert ?Behavior During Therapy: [facility] for tasks assessed/performed ?Overall Cognitive Status: Within Functional Limits for tasks assessed ?  ?  ?  ?  ?  ?  ?  ?  ?  ?  ?  ?  ?  ?  ?  ?  ?General Comments: Pt. is supine in bed when PT arrives.  Currently on bed pan.  NSG present and states they would like updated orthostatics on patient.  Patient is agreeable to work with PT.  No c/o pain at rest but does complain with movement.  Pt. also reports that his arms don't always do what he tells them too, and  that he has to focus with his eyes to be able to grab things. ?  ?  ? ?  ?Exercises Total Joint Exercises ?Long Arc Quad: AROM, Both, 5 reps (R decreased ROM compared to L) ?General Exercises - Lower Extremity ?Long Arc Quad: AROM, Both, 5 reps (R dec ROM compared to L) ? ?  ?General Comments General comments (skin integrity, edema, etc.): Pt reluctant to work on standing/transfers today due to fatigue. ?  ?  ? ?Pertinent Vitals/Pain Pain Assessment ?Pain Assessment: Faces ?Faces Pain Scale: Hurts little more ?Pain Location: C/o buttucks pain with pericare and in sitting ?Pain Descriptors / Indicators: Pressure, Discomfort, Grimacing ?Pain Intervention(s): Limited activity within patient's tolerance, Monitored during session  ? ? ?Home Living   ?  ?  ?  ?  ?  ?  ?  ?  ?  ?   ?  ?Prior Function    ?  ?  ?   ? ?PT Goals (current goals can now be found in the care plan section) Progress towards PT goals: Not progressing toward goals - comment (Not progressing towards goals at this time, but c-spine surgery was cancelled affecting pt function) ? ?  ?Frequency ? ? ? Min 3X/week ? ? ? ?  ?PT Plan Discharge plan needs to be updated  ? ? ?Co-evaluation   ?  ?  ?  ?  ? ?  ?AM-PAC PT "6 Clicks" Mobility   ?Outcome Measure ? Help needed turning from your back to your side while in a flat bed without using bedrails?: A Little ?Help needed moving from lying on your back to sitting on the side of a flat bed without using bedrails?: A Lot ?Help needed moving to and from a bed to a chair (including a wheelchair)?: A Lot ?Help needed standing up from a chair using your arms (e.g., wheelchair or bedside chair)?: Total ?Help needed to walk in hospital room?: Total ?Help needed climbing 3-5 steps with a railing? : Total ?6 Click Score: 10 ? ?  ?End of Session   ?Activity Tolerance: Patient tolerated treatment well ?Patient left: in bed;with call bell/phone within reach;with bed alarm set ?Nurse Communication: Mobility status ?  ?   ? ? ?Time: 7425-9563 ?PT Time Calculation (min) (ACUTE ONLY): 36 min ? ?Charges:  $Therapeutic Activity: 23-37 mins          ?          ? ?Haile Bosler A. Valyn Latchford, PT, DPT ?Acute Rehabilitation Services ?Office: [phone]  ? ? ?[place] ?06/13/2021, 12:27 PM ? ?

## 2021-06-14 DIAGNOSIS — J9621 Acute and chronic respiratory failure with hypoxia: Secondary | ICD-10-CM | POA: Diagnosis not present

## 2021-06-14 LAB — COMPREHENSIVE METABOLIC PANEL
ALT: 18 U/L (ref 0–44)
AST: 16 U/L (ref 15–41)
Albumin: 1.7 g/dL — ABNORMAL LOW (ref 3.5–5.0)
Alkaline Phosphatase: 88 U/L (ref 38–126)
Anion gap: 8 (ref 5–15)
BUN: 10 mg/dL (ref 6–20)
CO2: 29 mmol/L (ref 22–32)
Calcium: 7.8 mg/dL — ABNORMAL LOW (ref 8.9–10.3)
Chloride: 96 mmol/L — ABNORMAL LOW (ref 98–111)
Creatinine, Ser: 0.56 mg/dL — ABNORMAL LOW (ref 0.61–1.24)
GFR, Estimated: >60 mL/min (ref >60)
Glucose, Bld: 110 mg/dL — ABNORMAL HIGH (ref 70–99)
Potassium: 3.8 mmol/L (ref 3.5–5.1)
Sodium: 133 mmol/L — ABNORMAL LOW (ref 135–145)
Total Bilirubin: 0.1 mg/dL — ABNORMAL LOW (ref 0.3–1.2)
Total Protein: 5 g/dL — ABNORMAL LOW (ref 6.5–8.1)

## 2021-06-14 LAB — CBC WITH DIFFERENTIAL/PLATELET
Abs Immature Granulocytes: 0 10*3/uL (ref 0.00–0.07)
Basophils Absolute: 0 10*3/uL (ref 0.0–0.1)
Basophils Relative: 0 %
Eosinophils Absolute: 0.1 10*3/uL (ref 0.0–0.5)
Eosinophils Relative: 1 %
HCT: 29.3 % — ABNORMAL LOW (ref 39.0–52.0)
Hemoglobin: 8.9 g/dL — ABNORMAL LOW (ref 13.0–17.0)
Lymphocytes Relative: 8 %
Lymphs Abs: 1 10*3/uL (ref 0.7–4.0)
MCH: 25.2 pg — ABNORMAL LOW (ref 26.0–34.0)
MCHC: 30.4 g/dL (ref 30.0–36.0)
MCV: 83 fL (ref 80.0–100.0)
Monocytes Absolute: 0.1 10*3/uL (ref 0.1–1.0)
Monocytes Relative: 1 %
Neutro Abs: 11.5 10*3/uL — ABNORMAL HIGH (ref 1.7–7.7)
Neutrophils Relative %: 90 %
Platelets: 340 10*3/uL (ref 150–400)
RBC: 3.53 MIL/uL — ABNORMAL LOW (ref 4.22–5.81)
RDW: 25.7 % — ABNORMAL HIGH (ref 11.5–15.5)
WBC: 12.8 10*3/uL — ABNORMAL HIGH (ref 4.0–10.5)
nRBC: 0 % (ref 0.0–0.2)
nRBC: 0 /100 WBC

## 2021-06-14 LAB — PROCALCITONIN: Procalcitonin: <0.1 ng/mL

## 2021-06-14 LAB — GLUCOSE, CAPILLARY
Glucose-Capillary: 209 mg/dL — ABNORMAL HIGH (ref 70–99)
Glucose-Capillary: 170 mg/dL — ABNORMAL HIGH (ref 70–99)
Glucose-Capillary: 225 mg/dL — ABNORMAL HIGH (ref 70–99)
Glucose-Capillary: 109 mg/dL — ABNORMAL HIGH (ref 70–99)

## 2021-06-14 LAB — C-REACTIVE PROTEIN: CRP: 11.9 mg/dL — ABNORMAL HIGH (ref <1.0)

## 2021-06-14 LAB — MAGNESIUM: Magnesium: 1.8 mg/dL (ref 1.7–2.4)

## 2021-06-14 MED ORDER — METOCLOPRAMIDE HCL 5 MG PO TABS
5.0000 mg | ORAL_TABLET | Freq: Three times a day (TID) | ORAL | Status: DC
  Administered 2021-06-14 – 2021-06-15 (×5): 5 mg via ORAL
  Filled 2021-06-14 (×5): qty 1

## 2021-06-14 MED ORDER — OXYCODONE-ACETAMINOPHEN 5-325 MG PO TABS
1.0000 | ORAL_TABLET | Freq: Four times a day (QID) | ORAL | Status: DC | PRN
Start: 2021-06-14 — End: 2021-06-15
  Administered 2021-06-14 – 2021-06-15 (×4): 1 via ORAL
  Filled 2021-06-14 (×4): qty 1

## 2021-06-14 MED ORDER — ONDANSETRON HCL 4 MG/2ML IJ SOLN: 4.0000 mg | Freq: Four times a day (QID) | INTRAMUSCULAR | Status: DC | PRN

## 2021-06-14 MED ORDER — COSYNTROPIN 0.25 MG IJ SOLR
0.2500 mg | Freq: Once | INTRAMUSCULAR | Status: AC
  Administered 2021-06-15: 0.25 mg via INTRAVENOUS
  Filled 2021-06-14: qty 0.25

## 2021-06-14 MED ORDER — PANTOPRAZOLE SODIUM 40 MG PO TBEC
40.0000 mg | DELAYED_RELEASE_TABLET | Freq: Two times a day (BID) | ORAL | Status: DC
  Administered 2021-06-14 – 2021-06-15 (×3): 40 mg via ORAL
  Filled 2021-06-14 (×3): qty 1

## 2021-06-14 NOTE — Progress Notes (Signed)
?Progress Note ?Patient: Raymond [name] DOB: September 07, 1960 DOA: 05/31/2021  ?DOS: the patient was seen and examined on 06/14/2021 ? ?Brief hospital course: ?Past medical history of CAD SP PCI, COPD, chronic respiratory failure, GERD, active smoker, PVD, type II DM.  Presents with complaints of chest pain and elevated troponin as well as COPD exacerbation. ?Cardiology was consulted.  Underwent cardiac catheterization on 3/24 which showed nonobstructive CAD.  Medical management recommended. Patient developed Malena, associated with hypotension and tachycardia.  ?GI consulted and he was started on PPI GTT and transferred to ICU for hypotension and possible pressors.  ?Pt did not require vasopressors and he was transferred to [facility] on 06/05/21.  ?Patient underwent EGD on 3/29 that showed large cratered ulcer in distal third of stomach ?Hemoglobin has been stable and respiratory status appears to be at baseline ?He complained of weakness and ataxia,  ?MRI brain shows small incidental infarct.  Seen by neurology with recommendations for dual antiplatelet therapy when cleared by neurosurgery/GI  ?MRI C spine shows severe spinal stenosis with mass effect on cord ?Seen by neurosurgery with plans for operative management on 4/4 which was consulted to preop hypotension. ? ?Assessment and Plan: ?* Acute and chronic respiratory failure with hypoxia ([place]) ?COPD with acute exacerbation secondary to community-acquired pneumonia. ?Bilateral small pleural effusion. ?Patient uses 2 L of oxygen at home. weaned off to 3 L from 5 L. ?Chest x-ray left lower lobe pneumonia. ?He completed a course of doxycycline  ?Continue nebulizer therapy as well as steroids. ?Respiratory status appears to be approaching baseline. ? ?Hypotension ?Possible shock. ?Multifactorial in the setting of COPD exacerbation, acute blood loss anemia in the setting of GI bleed as well as polypharmacy. ?Lactic acid minimally elevated. ?Patient had 1 episode of  hypotension requiring ICU transfer when he was identified with GI bleed and another episode of hypotension preoperatively in the PACU.  ?Echocardiogram this admission shows preserved EF. ?Patient does have leukocytosis but no other evidence of acute infection.  Procalcitonin level is negative. ?Hemoglobin is relatively stable. ?Treated with IV fluid and IV albumin x1. ?Continue midodrine. ?Check cosyntropin stimulation test.  Unable to perform it on 4/6 due to error by staff. ? ?NSTEMI (non-ST elevated myocardial infarction) ([place]) ?HLD. ?HTN. ?Initially presented with complaints of chest tightness. ?Troponins were minimally elevated patient was started on IV heparin drip.  Underwent cardiac catheterization which shows nonobstructive CAD. ?Echocardiogram performed, EF 55 to 60%. ?Due to GI bleed patient is currently off of aspirin and Plavix. ?Due to hypotension patient currently off of beta-blockers. ?Continue 40 mg daily Crestor (Crestor dose increased from 10 mg). ?Cardiology currently signed off. ?Outpatient follow-up. ? ?Bleeding acute gastric ulcer ?Esophageal candidiasis. ?Patient developed acute GI bleed with hypotension on 3/26. ?Underwent EGD on 3/29. ?Found to have a large gastric ulcer with necrosis. ?Also found to have esophageal candidiasis on Diflucan. ?Pathology showed benign gastric ulcer without dysplasia or malignancy. ?No plan for repeat endoscopic evaluation during this hospitalization. ?Outpatient follow-up with Dr. Mann/Dr. Benson Norway in 3 months. ?GI recommend to avoid the use of all NSAIDs. ?Continue PPI postoperatively twice daily. ? ?Acute blood loss anemia ?Baseline hemoglobin around 10. ?status post 2 units PRBC ?Secondary to GI bleeding ?Continue to follow hemoglobin ? ?Cervical spinal stenosis ?History of lumbar spinal stenosis as well. ?Neurosurgery was consulted due to patient's complaints of weakness and for further work-up for acute stroke. ?-Noted to have severe spinal stenosis with mass  effect on cord C2-C3 and C3-C4 ?-Neurosurgery consulted patient was  recommended to undergo C2-C4 posterior cervical decompression and fusion. ?Perioperatively patient becomes severely hypotensive requiring cancellation of the surgery. ?Currently neurosurgery recommends outpatient surgery once the patient recovers from his acute medical issues. ? ?CVA (cerebral vascular accident) [facility]) ?Incidental finding on MRI brain of small acute infarct in right posterior frontal lobe ?Neurology consulted ?Continue statin.  LDL at goal.   ?Ideally patient would be on 81 mg aspirin and 75 mg Plavix for 21 days followed by 80 mg aspirin but currently on hold due to GI bleed. ? ?Hydronephrosis, right ?Based on the discussion with urology Dr. Abner Greenspan outpatient follow-up recommended with repeat CT renal stone protocol. ? 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  Monitor. ? ?Depression ?We will continue Wellbutrin XL. ? ?Hypokalemia ?Replaced ? ?Protein-calorie malnutrition, severe ?Body mass index is 21.86 kg/m?[name] ?Nutrition Problem: Severe Malnutrition (in the context of chronic illness) ?Etiology: poor appetite ?Nutrition Interventions: ?Interventions: Refer to RD note for recommendations  ? ?Pressure injury of skin ?Present on admission.  ?Stage I sacral and stage II Left lower buttock ?Continue foam dressing.   ? ?Tobacco use ?Continue nicotine patch. ?Patient was counseled on tobacco cessation. ? ?Subjective: No nausea no vomiting no fever no chills.  Requesting to increase his oral pain regimen.  Reports ongoing diarrhea without any blood.  No abdominal pain. ? ?Physical Exam: ?Vitals:  ? 06/14/21 0400 06/14/21 0736 06/14/21 0812 06/14/21 1150  ?BP: 105/71 110/72 117/65 104/67  ?Pulse: 73 84 74 92  ?Resp: '18 18 17 16  '$ ?Temp: 97.7 ?F (36.5 ?C)  97.9 ?F (36.6 ?C) 98.4 ?F (36.9 ?C)  ?TempSrc: Oral  Oral Oral  ?SpO2: 93% 99% 99% 98%  ?Weight:      ?Height:      ? ?General: Appear  in mild distress; no visible Abnormal Neck Mass Or lumps, Conjunctiva normal ?Cardiovascular: S1 and S2 Present, no Murmur, ?Respiratory: increased respiratory effort, Bilateral Air entry present and  bilateral Crackles, Occasional wheezes ?Abdomen: Bowel Sound present, Non tender  ?Extremities: no Pedal edema ?Neurology: alert and oriented to time, place, and person ?Gait not checked due to patient safety concerns  ? ?Data Reviewed: ?I have Reviewed nursing notes, Vitals, and Lab results since pt's last encounter. Pertinent lab results CBC and BMP ?I have ordered test including CBC BMP procalcitonin level ?I have ordered imaging studies chest x-ray.  ? ?Family Communication: None at bedside ? ?Disposition: ?Status is: Inpatient ?Remains inpatient appropriate because: Ongoing issues with blood pressure and respiratory status.  Ideally would require goals of care conversation. ? ?Author: ?Berle Mull, MD ?06/14/2021 6:17 PM ? ?For on call review [email or web address]. ?

## 2021-06-14 NOTE — TOC Progression Note (Addendum)
Transition of Care (TOC) - Progression Note  ? ? ?Patient Details  ?Name: Raymond [name] ?MRN: [id number] ?Date of Birth: [birth year] ? ?Transition of Care (TOC) CM/SW Contact  ?Milas Gain, LCSWA ?Phone Number: ?06/14/2021, 1:39 PM ? ?Clinical Narrative:    ? ?Update-2:45pm- CSW was informed by Bryson Ha with [facility] rehabilitation patients insurance authorization has been approved. Patient has SNF bed at [facility] rehab when medically ready. CSW will continue to follow. ? ?Patient has SNF bed at [facility] when medically ready for dc. CSW spoke with Bryson Ha with [facility] who confirmed patients insurance authorization is currently pending. CSW will continue to follow and assist with patients dc planning needs. ? ?Expected Discharge Plan: [place] ?Barriers to Discharge: Continued Medical Work up ? ?Expected Discharge Plan and Services ?Expected Discharge Plan: [place] ?In-house Referral: Clinical Social Work ?  ?  ?Living arrangements for the past 2 months: [place] ?                ?  ?  ?  ?  ?  ?  ?  ?  ?  ?  ? ? ?Social Determinants of Health (SDOH) Interventions ?  ? ?Readmission Risk Interventions ? ?  12/13/2020  ?  2:02 PM  ?Readmission Risk Prevention Plan  ?Transportation Screening Complete  ?PCP or Specialist Appt within 5-7 Days Complete  ?Home Care Screening Complete  ?Medication Review (RN CM) Complete  ? ? ?

## 2021-06-14 NOTE — Progress Notes (Signed)
Nutrition Follow-up ? ?DOCUMENTATION CODES:  ?Severe malnutrition in context of chronic illness ? ?INTERVENTION:  ?Continue current diet as ordered, encourage PO intake ?Continue Ensure Enlive po BID, each supplement provides 350 kcal and 20 grams of protein. ?Continue Juven BID, each packet provides 95 calories, 2.5 grams of protein (collagen) + micronutrients for wound healing ?Continue MVI with minerals daily ? ?NUTRITION DIAGNOSIS:  ?Severe Malnutrition (in the context of chronic illness) related to poor appetite as evidenced by severe fat depletion, severe muscle depletion, percent weight loss (20.6% x 11.5 months). ?- remains applicable ? ?GOAL:  ?Patient will meet greater than or equal to 90% of their needs ?- intake improving, supplements in place ? ?MONITOR:  ?PO intake, Supplement acceptance, Diet advancement ? ?REASON FOR ASSESSMENT:  ?Consult ?Assessment of nutrition requirement/status ? ?ASSESSMENT:  ?61 y.o. male with hx of COPD on 2L home O2, GERD, tobacco use, PVD, CAD, HLD, DM type 2, presented to ED with chest pain and SOB. Found to have NSTEMI ? ?3/24 - left heart cath and coronary angiography ?3/26 - acute GI bleed, transferred to ICU ?3/28 - transferred out of ICU ?3/29 - EGD, findings: esophageal candidiasis; Large non-obstructing, cratered ulcer occupying the distal 1/3 of the stomach in the prepyloric region with patchy necrotic areas. ? ?Pt resting in bed at the time of assessment. Endorses a good appetite and that he is consuming the ensures - but does not like vanilla. ? ?Noted pt has a SNF bed pending insurance authorization  ? ?Average Meal Intake: ?3/25-3/30: 48% intake x 5 recorded meals ?3/31- 4/6: 65% intake x 5 recorded meals ? ?Nutritionally Relevant Medications: ?Scheduled Meds: ? feeding supplement  237 mL Oral TID BM  ? insulin aspart  0-9 Units Subcutaneous TID WC  ? magnesium oxide  400 mg Oral Daily  ? multivitamin with minerals  1 tablet Oral Daily  ? nutrition supplement  (JUVEN)  1 packet Oral BID BM  ? pantoprazole  40 mg Oral BID AC  ? polyethylene glycol  17 g Oral Daily  ? rosuvastatin  40 mg Oral Daily  ? saccharomyces boulardii  250 mg Oral BID  ? ?PRN Meds: alum & mag hydroxide-simeth, loperamide, nitroGLYCERIN, ondansetron  ? ?Labs Reviewed: ?Sodium 133, chloride 96 ?Creatinine .56 ?CBG ranges from 146-209 mg/dL over the last 24 hours  ?HgbA1c 5.1% (3/23)  ? ?NUTRITION - FOCUSED PHYSICAL EXAM: ?Flowsheet Row Most Recent Value  ?Orbital Region Severe depletion  ?Upper Arm Region Severe depletion  ?Thoracic and Lumbar Region Moderate depletion  ?Buccal Region Severe depletion  ?Temple Region Moderate depletion  ?Clavicle Bone Region Severe depletion  ?Clavicle and Acromion Bone Region Severe depletion  ?Scapular Bone Region Severe depletion  ?Dorsal Hand Moderate depletion  ?Patellar Region Moderate depletion  ?Anterior Thigh Region Moderate depletion  ?Posterior Calf Region Severe depletion  ?Edema (RD Assessment) None  ?Hair Reviewed  ?Eyes Reviewed  ?Mouth Reviewed  ?Skin Reviewed  [scattered bruising]  ?Nails Reviewed  ? ?Diet Order:   ?Diet Order   ? ?       ?  DIET SOFT Room service appropriate? Yes; Fluid consistency: Thin  Diet effective now       ?  ?  Diet - low sodium heart healthy       ?  ? ?  ?  ? ?  ? ?EDUCATION NEEDS:  ?Education needs have been addressed ? ?Skin:  Skin Assessment: Reviewed RN Assessment (left Ischial tuberosity stage 2, non-healing surgical incision right foot,  stage 1 to the sacrum) ? ?Last BM:  4/5 type 6 ? ?Height:  ?Ht Readings from Last 1 Encounters:  ?06/12/21 '5\' 7"'$  (1.702 m)  ? ? ?Weight:  ?Wt Readings from Last 1 Encounters:  ?06/12/21 63.3 kg  ? ?Ideal Body Weight:  67.3 kg ? ?BMI:  Body mass index is 21.86 kg/m?. ? ?Estimated Nutritional Needs:  ?Kcal:  1800-2000 kcal/d ?Protein:  90-100 g/d ?Fluid:  >/=2 L/d ? ? ?Ranell Patrick, RD, LDN ?Clinical Dietitian ?RD pager # available in [place]  ?After hours/weekend pager # available in  [place] ?

## 2021-06-14 NOTE — Care Management Important Message (Signed)
Important Message ? ?Patient Details  ?Name: Raymond [name] ?MRN: [id number] ?Date of Birth: 05-23-1960 ? ? ?Medicare Important Message Given:  Yes ? ? ? ? ?Shelda Altes ?06/14/2021, 9:14 AM ?

## 2021-06-15 ENCOUNTER — Inpatient Hospital Stay (HOSPITAL_COMMUNITY): Payer: Medicare HMO

## 2021-06-15 DIAGNOSIS — R0902 Hypoxemia: Secondary | ICD-10-CM | POA: Diagnosis not present

## 2021-06-15 DIAGNOSIS — M4802 Spinal stenosis, cervical region: Secondary | ICD-10-CM | POA: Diagnosis not present

## 2021-06-15 DIAGNOSIS — R45851 Suicidal ideations: Secondary | ICD-10-CM | POA: Diagnosis not present

## 2021-06-15 DIAGNOSIS — I252 Old myocardial infarction: Secondary | ICD-10-CM | POA: Diagnosis not present

## 2021-06-15 DIAGNOSIS — K250 Acute gastric ulcer with hemorrhage: Secondary | ICD-10-CM | POA: Diagnosis not present

## 2021-06-15 DIAGNOSIS — E119 Type 2 diabetes mellitus without complications: Secondary | ICD-10-CM | POA: Diagnosis not present

## 2021-06-15 DIAGNOSIS — N1330 Unspecified hydronephrosis: Secondary | ICD-10-CM | POA: Diagnosis not present

## 2021-06-15 DIAGNOSIS — M47812 Spondylosis without myelopathy or radiculopathy, cervical region: Secondary | ICD-10-CM | POA: Diagnosis not present

## 2021-06-15 DIAGNOSIS — R52 Pain, unspecified: Secondary | ICD-10-CM | POA: Diagnosis not present

## 2021-06-15 DIAGNOSIS — Z8673 Personal history of transient ischemic attack (TIA), and cerebral infarction without residual deficits: Secondary | ICD-10-CM | POA: Diagnosis not present

## 2021-06-15 DIAGNOSIS — W06XXXA Fall from bed, initial encounter: Secondary | ICD-10-CM | POA: Diagnosis not present

## 2021-06-15 DIAGNOSIS — J8483 Surfactant mutations of the lung: Secondary | ICD-10-CM | POA: Diagnosis not present

## 2021-06-15 DIAGNOSIS — J449 Chronic obstructive pulmonary disease, unspecified: Secondary | ICD-10-CM | POA: Diagnosis not present

## 2021-06-15 DIAGNOSIS — M25511 Pain in right shoulder: Secondary | ICD-10-CM | POA: Diagnosis not present

## 2021-06-15 DIAGNOSIS — I959 Hypotension, unspecified: Secondary | ICD-10-CM | POA: Diagnosis not present

## 2021-06-15 DIAGNOSIS — W19XXXA Unspecified fall, initial encounter: Secondary | ICD-10-CM | POA: Diagnosis not present

## 2021-06-15 DIAGNOSIS — Z7189 Other specified counseling: Secondary | ICD-10-CM | POA: Diagnosis not present

## 2021-06-15 DIAGNOSIS — T8789 Other complications of amputation stump: Secondary | ICD-10-CM | POA: Diagnosis not present

## 2021-06-15 DIAGNOSIS — S0990XA Unspecified injury of head, initial encounter: Secondary | ICD-10-CM | POA: Diagnosis not present

## 2021-06-15 DIAGNOSIS — Z043 Encounter for examination and observation following other accident: Secondary | ICD-10-CM | POA: Diagnosis not present

## 2021-06-15 DIAGNOSIS — J90 Pleural effusion, not elsewhere classified: Secondary | ICD-10-CM | POA: Diagnosis not present

## 2021-06-15 DIAGNOSIS — R0609 Other forms of dyspnea: Secondary | ICD-10-CM | POA: Diagnosis not present

## 2021-06-15 DIAGNOSIS — R531 Weakness: Secondary | ICD-10-CM | POA: Diagnosis not present

## 2021-06-15 DIAGNOSIS — Z515 Encounter for palliative care: Secondary | ICD-10-CM | POA: Diagnosis not present

## 2021-06-15 DIAGNOSIS — R5381 Other malaise: Secondary | ICD-10-CM | POA: Diagnosis not present

## 2021-06-15 DIAGNOSIS — J9600 Acute respiratory failure, unspecified whether with hypoxia or hypercapnia: Secondary | ICD-10-CM | POA: Diagnosis not present

## 2021-06-15 DIAGNOSIS — K219 Gastro-esophageal reflux disease without esophagitis: Secondary | ICD-10-CM | POA: Diagnosis not present

## 2021-06-15 DIAGNOSIS — S4991XA Unspecified injury of right shoulder and upper arm, initial encounter: Secondary | ICD-10-CM | POA: Diagnosis not present

## 2021-06-15 DIAGNOSIS — G8929 Other chronic pain: Secondary | ICD-10-CM | POA: Diagnosis not present

## 2021-06-15 DIAGNOSIS — M542 Cervicalgia: Secondary | ICD-10-CM | POA: Diagnosis not present

## 2021-06-15 DIAGNOSIS — Z7401 Bed confinement status: Secondary | ICD-10-CM | POA: Diagnosis not present

## 2021-06-15 DIAGNOSIS — M25519 Pain in unspecified shoulder: Secondary | ICD-10-CM | POA: Diagnosis not present

## 2021-06-15 DIAGNOSIS — I639 Cerebral infarction, unspecified: Secondary | ICD-10-CM | POA: Diagnosis not present

## 2021-06-15 DIAGNOSIS — J9621 Acute and chronic respiratory failure with hypoxia: Secondary | ICD-10-CM | POA: Diagnosis not present

## 2021-06-15 DIAGNOSIS — G459 Transient cerebral ischemic attack, unspecified: Secondary | ICD-10-CM | POA: Diagnosis not present

## 2021-06-15 LAB — COMPREHENSIVE METABOLIC PANEL
ALT: 21 U/L (ref 0–44)
AST: 16 U/L (ref 15–41)
Albumin: 1.8 g/dL — ABNORMAL LOW (ref 3.5–5.0)
Alkaline Phosphatase: 86 U/L (ref 38–126)
Anion gap: 6 (ref 5–15)
BUN: 12 mg/dL (ref 6–20)
CO2: 31 mmol/L (ref 22–32)
Calcium: 7.9 mg/dL — ABNORMAL LOW (ref 8.9–10.3)
Chloride: 98 mmol/L (ref 98–111)
Creatinine, Ser: 0.58 mg/dL — ABNORMAL LOW (ref 0.61–1.24)
GFR, Estimated: >60 mL/min (ref >60)
Glucose, Bld: 66 mg/dL — ABNORMAL LOW (ref 70–99)
Potassium: 3.8 mmol/L (ref 3.5–5.1)
Sodium: 135 mmol/L (ref 135–145)
Total Bilirubin: 0.3 mg/dL (ref 0.3–1.2)
Total Protein: 5 g/dL — ABNORMAL LOW (ref 6.5–8.1)

## 2021-06-15 LAB — CBC WITH DIFFERENTIAL/PLATELET
Abs Immature Granulocytes: 0 10*3/uL (ref 0.00–0.07)
Basophils Absolute: 0.1 10*3/uL (ref 0.0–0.1)
Basophils Relative: 1 %
Eosinophils Absolute: 0.1 10*3/uL (ref 0.0–0.5)
Eosinophils Relative: 1 %
HCT: 29 % — ABNORMAL LOW (ref 39.0–52.0)
Hemoglobin: 8.8 g/dL — ABNORMAL LOW (ref 13.0–17.0)
Lymphocytes Relative: 18 %
Lymphs Abs: 2.6 10*3/uL (ref 0.7–4.0)
MCH: 25.4 pg — ABNORMAL LOW (ref 26.0–34.0)
MCHC: 30.3 g/dL (ref 30.0–36.0)
MCV: 83.6 fL (ref 80.0–100.0)
Monocytes Absolute: 1 10*3/uL (ref 0.1–1.0)
Monocytes Relative: 7 %
Neutro Abs: 10.4 10*3/uL — ABNORMAL HIGH (ref 1.7–7.7)
Neutrophils Relative %: 73 %
Platelets: 353 10*3/uL (ref 150–400)
RBC: 3.47 MIL/uL — ABNORMAL LOW (ref 4.22–5.81)
RDW: 25.6 % — ABNORMAL HIGH (ref 11.5–15.5)
WBC: 14.2 10*3/uL — ABNORMAL HIGH (ref 4.0–10.5)
nRBC: 0 % (ref 0.0–0.2)
nRBC: 0 /100 WBC

## 2021-06-15 LAB — GLUCOSE, CAPILLARY
Glucose-Capillary: 70 mg/dL (ref 70–99)
Glucose-Capillary: 155 mg/dL — ABNORMAL HIGH (ref 70–99)

## 2021-06-15 LAB — ACTH STIMULATION, 3 TIME POINTS
Cortisol, 30 Min: 22.3 ug/dL
Cortisol, 60 Min: 26.5 ug/dL
Cortisol, Base: 12.9 ug/dL

## 2021-06-15 LAB — MAGNESIUM: Magnesium: 1.9 mg/dL (ref 1.7–2.4)

## 2021-06-15 MED ORDER — METOCLOPRAMIDE HCL 5 MG PO TABS: 5.0000 mg | ORAL_TABLET | Freq: Three times a day (TID) | ORAL | 0 refills | Status: AC | PRN

## 2021-06-15 MED ORDER — OXYCODONE-ACETAMINOPHEN 5-325 MG PO TABS: 1.0000 | ORAL_TABLET | ORAL | 0 refills | Status: AC | PRN

## 2021-06-15 MED ORDER — ASPIRIN EC 81 MG PO TBEC: 81.0000 mg | DELAYED_RELEASE_TABLET | Freq: Every day | ORAL | 2 refills | Status: AC

## 2021-06-15 MED ORDER — ALPRAZOLAM 0.25 MG PO TABS
0.2500 mg | ORAL_TABLET | Freq: Two times a day (BID) | ORAL | 0 refills | Status: AC | PRN
Start: 2021-06-15 — End: 2021-06-20

## 2021-06-15 MED ORDER — MIDODRINE HCL 2.5 MG PO TABS
2.5000 mg | ORAL_TABLET | Freq: Three times a day (TID) | ORAL | 0 refills | Status: AC
Start: 2021-06-15 — End: ?

## 2021-06-15 MED ORDER — PANTOPRAZOLE SODIUM 40 MG PO TBEC: 40.0000 mg | DELAYED_RELEASE_TABLET | Freq: Two times a day (BID) | ORAL | 0 refills | Status: AC

## 2021-06-15 MED ORDER — OXYCODONE-ACETAMINOPHEN 5-325 MG PO TABS
1.0000 | ORAL_TABLET | ORAL | Status: DC | PRN
  Administered 2021-06-15: 1 via ORAL
  Filled 2021-06-15: qty 1

## 2021-06-15 MED ORDER — IPRATROPIUM-ALBUTEROL 0.5-2.5 (3) MG/3ML IN SOLN: 3.0000 mL | Freq: Two times a day (BID) | RESPIRATORY_TRACT | 0 refills | Status: AC

## 2021-06-15 MED ORDER — BUPROPION HCL ER (XL) 150 MG PO TB24: ORAL_TABLET | ORAL | 0 refills | Status: AC

## 2021-06-15 MED ORDER — GABAPENTIN 100 MG PO CAPS: 100.0000 mg | ORAL_CAPSULE | Freq: Three times a day (TID) | ORAL | 0 refills | Status: AC

## 2021-06-15 MED ORDER — DULOXETINE HCL 20 MG PO CPEP: 20.0000 mg | ORAL_CAPSULE | Freq: Every day | ORAL | 0 refills | Status: AC

## 2021-06-15 MED ORDER — FLUCONAZOLE 150 MG PO TABS: 300.0000 mg | ORAL_TABLET | Freq: Every day | ORAL | 0 refills | Status: AC

## 2021-06-15 NOTE — Progress Notes (Signed)
Report given to [facility]. ?

## 2021-06-15 NOTE — Progress Notes (Signed)
Physical Therapy Treatment ?Patient Details ?Name: Raymond [name] ?MRN: [id number] ?DOB: 1961/01/07 ?Today's Date: 06/15/2021 ? ? ?History of Present Illness 61 yo admitted 3/23 with chest pain. 3/24 pt s/p cardiac cath with coffee ground emesis and respiratory failure. 3/29 EGD.  Cervical decompression sx cancelled due to low BP.  Not rescheduled at this time. 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 ?  ?PT Comments  ? ? Pt very pleasant and frustrated that he did not get dinner. Pt able to stand with decreased assist today but remains very limited in functional mobility by bil UE and RLE ataxia. Pt assisting as able to step with physical assist to control RLE and trunk but able to increase stepping trials and balance in sitting and standing this session. Pt remains incontinent of bowel with sore sacrum and denied OOB to chair this session.  ? ?bP supine 128/81 (96) ?Initial sitting 80/66 (72) ?Sitting after pivot 92/78 (84) ?   ?Recommendations for follow up therapy are one component of a multi-disciplinary discharge planning process, led by the attending physician.  Recommendations may be updated based on patient status, additional functional criteria and insurance authorization. ? ?Follow Up Recommendations ? Skilled nursing-short term rehab (<3 hours/day) ?  ?  ?Assistance Recommended at Discharge Frequent or constant Supervision/Assistance  ?Patient can return home with the following Assistance with cooking/housework;A lot of help with bathing/dressing/bathroom;Assist for transportation;Two people to help with walking and/or transfers;Direct supervision/assist for medications management;Help with stairs or ramp for entrance ?  ?Equipment Recommendations ? Wheelchair (measurements PT);Wheelchair cushion (measurements PT)  ?  ?Recommendations for Other Services   ? ? ?  ?Precautions / Restrictions Precautions ?Precautions: Fall;Cervical ?Precaution Booklet Issued:  No ?Precaution Comments: Cord compression c-spine  ?  ? ?Mobility ? Bed Mobility ?Overal bed mobility: Needs Assistance ?Bed Mobility: Sit to Supine, Sidelying to Sit ?Rolling: Mod assist ?Sidelying to sit: Mod assist ?  ?Sit to supine: Max assist ?  ?General bed mobility comments: rolling with mod assist with pad for pericare due to incontinent bowel. Pt transitioned side to sitting with mod assist. Pt unable to tolerate return to side for transition back to bed and required max assist to pivot back into bed and position ?  ? ?Transfers ?Overall transfer level: Needs assistance ?  ?Transfers: Sit to/from Stand ?Sit to Stand: Min assist ?Stand pivot transfers: Mod assist, +2 safety/equipment ?Step pivot transfers: Mod assist, +2 physical assistance, +2 safety/equipment ?  ?  ?  ?General transfer comment: pt able to stand x 3 trials with min physical assist to rise from surface with left knee blocked and pt attempting to hug P.T. for support with decreased control of bil UE. PT able to stand and pivot to chair with +2 assist to move and position RLE. Pt then stood from chair and stepped forward with mod assist to weight shift and max assist for control of RLE to step forward 2' toward foot of bed then additional stepping to side step 2' toward [facility] ?  ? ?Ambulation/Gait ?  ?  ?  ?  ?  ?  ?  ?General Gait Details: unable ? ? ?Stairs ?  ?  ?  ?  ?  ? ? ?Wheelchair Mobility ?  ? ?Modified Rankin (Stroke Patients Only) ?  ? ? ?  ?Balance Overall balance assessment: Needs assistance ?  ?Sitting balance-Leahy Scale: Fair ?Sitting balance - Comments: pt with improved sitting  balance EOB this session without UE support and able to maintain upright neck and trunk max 10 sec prior to fatigue x 4 trials ?  ?Standing balance support:  (partial UE support limited by ataxia) ?Standing balance-Leahy Scale: Poor ?Standing balance comment: physical assist of belt and pad with left knee blocked ?  ?  ?  ?  ?  ?  ?  ?  ?  ?  ?  ?  ? ?   ?Cognition Arousal/Alertness: Awake/alert ?Behavior During Therapy: [facility] for tasks assessed/performed ?Overall Cognitive Status: Impaired/Different from baseline ?  ?  ?  ?  ?  ?  ?  ?  ?  ?  ?  ?  ?  ?Safety/Judgement: Decreased awareness of deficits ?  ?  ?General Comments: decreased awareness of need for assist and safety ?  ?  ? ?  ?Exercises   ? ?  ?General Comments   ?  ?  ? ?Pertinent Vitals/Pain Pain Assessment ?Pain Assessment: 0-10 ?Pain Score: 4  ?Pain Location: back with mobility ?Pain Descriptors / Indicators: Aching, Guarding ?Pain Intervention(s): Limited activity within patient's tolerance, Monitored during session, Repositioned  ? ? ?Home Living   ?  ?  ?  ?  ?  ?  ?  ?  ?  ?   ?  ?Prior Function    ?  ?  ?   ? ?PT Goals (current goals can now be found in the care plan section) Acute Rehab PT Goals ?PT Goal Formulation: With patient ?Time For Goal Achievement: 06/29/21 ?Potential to Achieve Goals: Fair ?Progress towards PT goals: Goals downgraded-see care plan ? ?  ?Frequency ? ? ? Min 3X/week ? ? ? ?  ?PT Plan Current plan remains appropriate  ? ? ?Co-evaluation   ?  ?  ?  ?  ? ?  ?AM-PAC PT "6 Clicks" Mobility   ?Outcome Measure ? Help needed turning from your back to your side while in a flat bed without using bedrails?: A Little ?Help needed moving from lying on your back to sitting on the side of a flat bed without using bedrails?: A Lot ?Help needed moving to and from a bed to a chair (including a wheelchair)?: Total ?Help needed standing up from a chair using your arms (e.g., wheelchair or bedside chair)?: A Little ?Help needed to walk in hospital room?: Total ?Help needed climbing 3-5 steps with a railing? : Total ?6 Click Score: 11 ? ?  ?End of Session Equipment Utilized During Treatment: Gait belt;Oxygen ?Activity Tolerance: Patient tolerated treatment well ?Patient left: in bed;with call bell/phone within reach;with bed alarm set ?Nurse Communication: Mobility status ?PT Visit Diagnosis:  Other abnormalities of gait and mobility (R26.89);Difficulty in walking, not elsewhere classified (R26.2);Muscle weakness (generalized) (M62.81);Other symptoms and signs involving the nervous system (R29.898) ?  ? ? ?Time: [phone] ?PT Time Calculation (min) (ACUTE ONLY): 29 min ? ?Charges:  $Therapeutic Activity: 23-37 mins          ?          ? ?Jedrick Hutcherson P, PT ?Acute Rehabilitation Services ?Pager: [phone] ?Office: [phone] ? ? ? ?Cass Edinger B Gottfried Standish ?06/15/2021, 9:15 AM ? ?

## 2021-06-15 NOTE — Discharge Summary (Addendum)
Physician Discharge Summary   Patient: Raymond [name] MRN: [id number] DOB: 30-Oct-1960  Admit date:     05/31/2021  Discharge date: 06/15/21  Discharge Physician: Lynden Oxford  PCP: Elfredia Nevins, MD  Recommendations at discharge:  Follow up with  Gastroenterology as recommended in 3 month for a repeat EGD Cardiology as recommended in 1 month Neurosurgery Dr Amador Cunas in 2 weeks.  PCP in 1-2 week with CBC and BMP.  Neurology in 2 months.  Urology as recommended  Discharge Diagnoses: Principal Problem:   Acute and chronic respiratory failure with hypoxia (HCC) Active Problems:   NSTEMI (non-ST elevated myocardial infarction) (HCC)   Hypotension   COPD with acute exacerbation (HCC)   CAP (community acquired pneumonia)   Dyslipidemia   GERD without esophagitis   Acute blood loss anemia   Bleeding acute gastric ulcer   Esophageal candidiasis (HCC)   Spinal stenosis, lumbar region, with neurogenic claudication   CVA (cerebral vascular accident) (HCC)   Right hemisphere, cerebral infarction (HCC)   Cervical spinal stenosis   Hydronephrosis, right   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  Presents with complaints of chest pain and elevated troponin as well as COPD exacerbation. Cardiology was consulted.  Underwent cardiac catheterization on 3/24 which showed nonobstructive CAD.  Medical management recommended. Patient developed Malena, associated with hypotension and tachycardia.  GI consulted and he was started on PPI GTT and transferred to ICU for hypotension and possible pressors.  Pt did not require vasopressors and he was transferred to [facility] on 06/05/21.  Patient underwent EGD on 3/29 that showed large cratered ulcer in distal  third of stomach Hemoglobin has been stable and respiratory status appears to be at baseline He complained of weakness and ataxia,  MRI brain shows small incidental infarct.  Seen by neurology with recommendations for dual antiplatelet therapy when cleared by neurosurgery/GI  MRI C spine shows severe spinal stenosis with mass effect on cord Seen by neurosurgery with plans for operative management on 4/4 which was consulted to preop hypotension.  Assessment and Plan: * Acute and chronic respiratory failure with hypoxia (HCC) COPD with acute exacerbation secondary to community-acquired pneumonia. Bilateral small pleural effusion. Patient uses 2 L of oxygen at home. weaned off to 3 L from 5 L. Chest x-ray left lower lobe pneumonia. He completed a course of doxycycline  Continue nebulizer therapy as well as steroids. Respiratory status appears to be approaching baseline.  Hypotension Possible shock. Multifactorial in the setting of COPD exacerbation, acute blood loss anemia in the setting of GI bleed as well as polypharmacy. Lactic acid minimally elevated. Patient had 1 episode of hypotension requiring ICU transfer when he was identified with GI bleed and another episode of hypotension preoperatively in the PACU.  Echocardiogram this admission shows preserved EF. Patient does have leukocytosis but no other evidence of acute infection.  Procalcitonin level is negative. Hemoglobin is relatively stable. Treated with IV fluid and IV albumin x1. Continue midodrine. Negative cosyntropin stimulation test.  NSTEMI (non-ST elevated myocardial infarction) (HCC) HLD. HTN. Initially presented with complaints of chest tightness. Troponins were minimally elevated patient was started on IV heparin drip.  Underwent cardiac catheterization which shows nonobstructive CAD. Echocardiogram performed, EF 55 to 60%. Due to  GI bleed patient was off of aspirin and Plavix. Per my discussion with Dr Loreta Ave on  4/7, pt can start on enteric coated aspirin 81 mg now.  Due to hypotension patient currently off of beta-blockers. Continue 40 mg daily Crestor (Crestor dose increased from 10 mg). Cardiology currently signed off. Outpatient follow-up.  Bleeding acute gastric ulcer Esophageal candidiasis. Patient developed acute GI bleed with hypotension on 3/26. Underwent EGD on 3/29. Found to have a large gastric ulcer with necrosis. Also found to have esophageal candidiasis on Diflucan. Pathology showed benign gastric ulcer without dysplasia or malignancy. No plan for repeat endoscopic evaluation during this hospitalization. Outpatient follow-up with Dr. Mann/Dr. Elnoria Howard in 3 months. GI recommend to avoid the use of all NSAIDs. Continue PPI postoperatively twice daily. Per my discussion with Dr Loreta Ave on 4/7, pt can start on enteric coated aspirin 81 mg now.   Acute blood loss anemia Baseline hemoglobin around 10. status post 2 units PRBC Secondary to GI bleeding Continue to follow hemoglobin  Cervical spinal stenosis History of lumbar spinal stenosis as well. Neurosurgery was consulted due to patient's complaints of weakness and for further work-up for acute stroke. -Noted to have severe spinal stenosis with mass effect on cord C2-C3 and C3-C4 -Neurosurgery consulted patient was recommended to undergo C2-C4 posterior cervical decompression and fusion. Perioperatively patient becomes severely hypotensive requiring cancellation of the surgery. Currently neurosurgery recommends outpatient surgery once the patient recovers from his acute medical issues.  CVA (cerebral vascular accident) [facility]) Incidental finding on MRI brain of small acute infarct in right posterior frontal lobe Neurology consulted Continue statin.  LDL at goal.   Ideally patient would be on 81 mg aspirin and 75 mg Plavix for 21 days followed by 81 mg aspirin but was on hold due to GI bleed. Per my discussion with Dr Loreta Ave on 4/7, pt  can start on enteric coated aspirin 81 mg now.   Hydronephrosis, right Based on the discussion with urology Dr. Cardell Peach outpatient follow-up recommended with repeat CT renal stone protocol.  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  Monitor.  Depression Palliative care recommended to taper off the wellbutrin  Will start every other day taper with plans to stop it in 14 days.  Replacement recommended with cymbalta due to its neuropathic pain control properties. Will start at low dose 20 mg after 1 week.   Hypokalemia Replaced  Protein-calorie malnutrition, severe Body mass index is 21.86 kg/m. Nutrition Problem: Severe Malnutrition (in the context of chronic illness) Etiology: poor appetite Nutrition Interventions: Interventions: Refer to RD note for recommendations   Pressure injury of skin Present on admission.  Stage I sacral and stage II Left lower buttock Continue foam dressing.    Tobacco use Continue nicotine patch. Patient was counseled on tobacco cessation.  Diarrhea  Due to reglan, will stop it  Pain control -  [facility] Controlled Substance Reporting System database was reviewed. and patient was instructed, not to drive, operate heavy machinery, perform activities at heights, swimming or participation in water activities or provide baby-sitting services while on Pain, Sleep and Anxiety Medications; until their outpatient Physician has advised to do so again. Also recommended to not to take more than prescribed Pain, Sleep and Anxiety Medications.  Pt has been chronically on percocet 10 mg dose, due to hypotension the dose is reduced to 5 mg.   Consultants: Cardiology Neurology  Gastroenterology  PCCM   Procedures performed:  EGD Blood transfusion  Echocardiogram  Cardiac Catheterization   DISCHARGE MEDICATION: Allergies as of 06/15/2021       Reactions   Morphine And Related Shortness Of Breath,  Palpitations   Penicillins Anaphylaxis   Vicodin [hydrocodone-acetaminophen] Shortness Of Breath, Palpitations   Latex Hives, Rash        Medication List     STOP taking these medications    aspirin 81 MG tablet Replaced by: aspirin EC 81 MG tablet   calcium carbonate 750 MG chewable tablet Commonly known as: TUMS EX   clopidogrel 75 MG tablet Commonly known as: PLAVIX   Dulcolax Soft Chews 1200 MG Chew Generic drug: Magnesium Hydroxide   omeprazole 40 MG capsule Commonly known as: PRILOSEC   ondansetron 4 MG tablet Commonly known as: ZOFRAN   oxyCODONE-acetaminophen 10-325 MG tablet Commonly known as: PERCOCET Replaced by: oxyCODONE-acetaminophen 5-325 MG tablet       TAKE these medications    albuterol 0.63 MG/3ML nebulizer solution Commonly known as: ACCUNEB Take 1 ampule by nebulization every 6 (six) hours as needed for wheezing or shortness of breath.   albuterol 108 (90 Base) MCG/ACT inhaler Commonly known as: VENTOLIN HFA Inhale 2 puffs into the lungs every 6 (six) hours as needed for wheezing.   allopurinol 100 MG tablet Commonly known as: ZYLOPRIM Take 100 mg by mouth in the morning.   ALPRAZolam 0.25 MG tablet Commonly known as: XANAX Take 1 tablet (0.25 mg total) by mouth 2 (two) times daily as needed for up to 5 days for anxiety.   aspirin EC 81 MG tablet Take 1 tablet (81 mg total) by mouth daily. Swallow whole. Replaces: aspirin 81 MG tablet   buPROPion 150 MG 24 hr tablet Commonly known as: WELLBUTRIN XL Take 1 tablet (150 mg total) by mouth every other day for 8 days, THEN 1 tablet (150 mg total) every 3 (three) days for 6 days. Then stop.. Start taking on: June 15, 2021 What changed: See the new instructions.   DULoxetine 20 MG capsule Commonly known as: Cymbalta Take 1 capsule (20 mg total) by mouth daily. Start taking on: June 21, 2021   feeding supplement Liqd Take 237 mLs by mouth 3 (three) times daily between meals.    nutrition supplement (JUVEN) Pack Take 1 packet by mouth 2 (two) times daily between meals.   fluconazole 150 MG tablet Commonly known as: DIFLUCAN Take 2 tablets (300 mg total) by mouth daily for 3 days. Start taking on: June 16, 2021   gabapentin 100 MG capsule Commonly known as: NEURONTIN Take 1 capsule (100 mg total) by mouth 3 (three) times daily. What changed:  medication strength how much to take   guaiFENesin 600 MG 12 hr tablet Commonly known as: MUCINEX Take 1 tablet (600 mg total) by mouth 2 (two) times daily.   ipratropium-albuterol 0.5-2.5 (3) MG/3ML Soln Commonly known as: DUONEB Take 3 mLs by nebulization 2 (two) times daily.   magnesium oxide 400 MG tablet Commonly known as: MAG-OX Take 1 tablet by mouth daily.   metFORMIN 500 MG tablet Commonly known as: GLUCOPHAGE Take 2 tablets by mouth 2 (two) times daily.   metoCLOPramide 5 MG tablet Commonly known as: REGLAN Take 1 tablet (5 mg total) by mouth every 8 (eight) hours as needed for nausea or vomiting.   metoprolol tartrate 25 MG tablet Commonly known as: LOPRESSOR TAKE 1/2 TABLET BY MOUTH TWICE DAILY Needs appointment for further refills What changed: See the new instructions.   midodrine 2.5 MG tablet  Commonly known as: PROAMATINE Take 1 tablet (2.5 mg total) by mouth 3 (three) times daily with meals.   multivitamin with minerals Tabs tablet Take 1 tablet by mouth daily.   nicotine 21 mg/24hr patch Commonly known as: NICODERM CQ - dosed in mg/24 hours Place 1 patch (21 mg total) onto the skin daily.   nitroGLYCERIN 0.4 MG SL tablet Commonly known as: NITROSTAT Place 1 tablet (0.4 mg total) under the tongue every 5 (five) minutes x 3 doses as needed for chest pain.   oxyCODONE-acetaminophen 5-325 MG tablet Commonly known as: PERCOCET/ROXICET Take 1 tablet by mouth every 4 (four) hours as needed for moderate pain or severe pain. Replaces: oxyCODONE-acetaminophen 10-325 MG tablet    pantoprazole 40 MG tablet Commonly known as: PROTONIX Take 1 tablet (40 mg total) by mouth 2 (two) times daily before a meal.   rosuvastatin 40 MG tablet Commonly known as: CRESTOR Take 1 tablet (40 mg total) by mouth daily. What changed:  medication strength how much to take when to take this   saccharomyces boulardii 250 MG capsule Commonly known as: FLORASTOR Take 1 capsule (250 mg total) by mouth 2 (two) times daily.   silver sulfADIAZINE 1 % cream Commonly known as: SILVADENE Apply 1 application topically 2 (two) times daily. apply a 1/16 inch (1.5 mm) thick layer to entire burn area   tamsulosin 0.4 MG Caps capsule Commonly known as: FLOMAX Take 0.4 mg by mouth in the morning.               Discharge Care Instructions  (From admission, onward)           Start     Ordered   06/15/21 0000  Discharge wound care:       Comments: Cleanse right lateral foot wound with saline, pat dry. Place a small piece of Xeroform gauze over the wound, then dry gauze, secure with a few turns of kerlix. Wound care  Daily       For the left buttock/hip wound, cleanse with saline, place a small piece of xeroform gauze over the wound, and cover with a foam dressing. Change both every 2 days.   06/15/21 1119   06/03/21 0000  Discharge wound care:       Comments: Cleanse right lateral foot wound with saline, pat dry. Place a small piece of Xeroform gauze over the wound, then dry gauze, secure with a few turns of kerlix.  For the left buttock/hip wound, cleanse with saline, place a small piece of xeroform gauze over the wound, and cover with a foam dressing. Change both every 2 days.   06/03/21 0800            Follow-up Information     Elfredia Nevins, MD. Schedule an appointment as soon as possible for a visit in 1 week(s).   Specialty: Internal Medicine Contact information: [address] [place] [address] [phone]         Antoine Poche, MD.  Schedule an appointment as soon as possible for a visit in 1 month(s).   Specialty: Cardiology Contact information: [address] [place] [address] [phone]         [facility] [place]. Schedule an appointment as soon as possible for a visit in 2 week(s).   Contact information: [address] [place] [address] [phone]        Dawley, Alan Mulder, DO. Schedule an appointment as soon as possible  for a visit in 2 week(s).   Contact information: [address] [address] [place] [address] [phone]         [facility]. Schedule an appointment as soon as possible for a visit in 2 month(s).   Specialty: Neurology Contact information: [address] [place] [address] [phone]        Charna Elizabeth, MD Follow up in 3 month(s).   Specialty: Gastroenterology Contact information: [address], [place] [address] [phone]         Palliative care. Schedule an appointment as soon as possible for a visit.   Why: at SNF for continuous coversation about goals of care.               Disposition: Skilled nursing facility Diet recommendation:  Discharge Diet Orders (From admission, onward)     Start     Ordered   06/03/21 0000  Diet - low sodium heart healthy        06/03/21 0800           Discharge Exam: Filed Weights   05/31/21 2039 06/04/21 0600 06/12/21 1156  Weight: 60.6 kg 63.3 kg 63.3 kg   General: Appear in mild distress; no visible Abnormal Neck Mass Or lumps, Conjunctiva normal Cardiovascular: S1 and S2 Present, aortic systolic Murmur, Respiratory: good respiratory effort, Bilateral Air entry present and  faint bilateral basal Crackles, bilateral  wheezes Abdomen: Bowel Sound present Non tender  Extremities: no Pedal edema Neurology: alert and oriented to time, place, and person Gait not checked due to  patient safety concerns   Condition at discharge: stable  The results of significant diagnostics from this hospitalization (including imaging, microbiology, ancillary and laboratory) are listed below for reference.   Imaging Studies: DG Chest 2 View  Result Date: 06/08/2021 CLINICAL DATA:  Stroke EXAM: CHEST - 2 VIEW COMPARISON:  06/03/2021 FINDINGS: Heart size and pulmonary vascularity are normal. Small bilateral pleural effusions with evidence of perihilar infiltration may indicate pulmonary edema, new since prior study. No pneumothorax. Mediastinal contours appear intact. Postoperative changes in the cervical spine. IMPRESSION: Perihilar infiltrates and small effusions.  New since prior study. Electronically Signed   By: Burman Nieves M.D.   On: 06/08/2021 20:05   MR ANGIO HEAD WO CONTRAST  Result Date: 06/09/2021 CLINICAL DATA:  61 year old male with progressive extremity ataxia. 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 EXAM: MRA HEAD WITHOUT CONTRAST TECHNIQUE: Angiographic images of the Circle of Willis were acquired using MRA technique without intravenous contrast. COMPARISON:  Brain MRI 06/07/2021. FINDINGS: Anterior circulation: Antegrade flow in both ICA siphons. No siphon stenosis. Ophthalmic and left posterior communicating artery origins appear normal. Mild motion artifact at the ICA termini, MCA and ACA origins which remain patent. Continued motion artifact affecting detail of the bilateral MCA and ACA branches, with no branch occlusion evident. Posterior circulation: Antegrade flow in the posterior circulation with dominant appearing distal right vertebral artery as on the MRI. Patent distal vertebral arteries and PICA origins. Patent vertebrobasilar junction and basilar artery with no stenosis identified. Patent AICA, SCA and PCA origins. Left posterior communicating artery is present, right is diminutive or absent. Bilateral PCA branches are  within normal limits. Anatomic variants: Dominant right vertebral artery. Other: No intracranial mass effect or ventriculomegaly. IMPRESSION: Negative intracranial MRA when allowing for motion artifact at the level of the MCA and ACA branches. Electronically Signed  By: Odessa Fleming M.D.   On: 06/09/2021 11:52   MR BRAIN W WO CONTRAST  Result Date: 06/07/2021 CLINICAL DATA:  Progressive bilateral upper extremity and lower extremity ataxia EXAM: MRI HEAD WITHOUT AND WITH CONTRAST TECHNIQUE: Multiplanar, multiecho pulse sequences of the brain and surrounding structures were obtained without and with intravenous contrast. CONTRAST:  6mL GADAVIST GADOBUTROL 1 MMOL/ML IV SOLN COMPARISON:  01/22/2009 MRI head, correlation is also made with CTA 05/05/2020 FINDINGS: Evaluation is somewhat limited by motion artifact. Brain: Small focus of restricted diffusion with ADC correlate in the posterior right frontal lobe (series 3, image 40 and series 300, image 40). No acute hemorrhage, mass, mass effect, or midline shift. No hydrocephalus or extra-axial collection. No parenchymal or meningeal enhancement. Minimal T2 hyperintense signal in the periventricular white matter, likely the sequela of chronic small vessel ischemic disease. Cerebral and cerebellar volumes are within normal limits. Vascular: Normal flow voids. Skull and upper cervical spine: Normal marrow signal. Sinuses/Orbits: Mucosal thickening in the left maxillary sinus and anterior ethmoid air cells. The orbits are unremarkable. Other: Fluid throughout the left mastoid air cells, with trace fluid in the right mastoid air cells. IMPRESSION: 1. Evaluation is somewhat limited by motion artifact. Within this limitation, there is a small acute infarct in the posterior right frontal lobe. 2. No other acute intracranial process. Electronically Signed   By: Wiliam Ke M.D.   On: 06/07/2021 19:29   MR CERVICAL SPINE W WO CONTRAST  Addendum Date: 06/09/2021   ADDENDUM  REPORT: 06/09/2021 12:19 ADDENDUM: Study discussed by telephone with Dr. Selina Cooley on 06/09/2021 at 12:03 . Electronically Signed   By: Odessa Fleming M.D.   On: 06/09/2021 12:19   Result Date: 06/09/2021 CLINICAL DATA:  61 year old male with progressive extremity ataxia. 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 EXAM: MRI CERVICAL SPINE WITHOUT AND WITH CONTRAST TECHNIQUE: Multiplanar and multiecho pulse sequences of the cervical spine, to include the craniocervical junction and cervicothoracic junction were obtained without and with intravenous contrast. CONTRAST:  6mL GADAVIST GADOBUTROL 1 MMOL/ML IV SOLN COMPARISON:  Brain MRI 06/07/2021. Cervical spine MRI 01/15/2005. FINDINGS: Study is intermittently degraded by motion artifact despite repeated imaging attempts. Alignment: Increase straightening of cervical lordosis compared to the preoperative MRI in 2006, and new 2-3 mm degenerative appearing anterolisthesis of C7 on T1. Vertebrae: Hardware susceptibility artifact related to interval C4 through C7 ACDF. Degenerative endplate marrow signal changes at both C2-C3 and C3-C4 (see below), with no convincing No acute osseous abnormality identified. Cord: Abnormal spinal cord at both the C2-C3 and C3-C4 levels, where degenerative cord compression appears associated with subtle cord signal changes suspicious for myelomalacia. Below C3 spinal cord signal and morphology appear more normal. No abnormal intradural enhancement. Degenerative appearing dorsal dural thickening. Posterior Fossa, vertebral arteries, paraspinal tissues: Cervicomedullary junction is within normal limits. Negative visible posterior fossa. Evidence of dominant right vertebral artery. No definite acute neck soft tissue finding. Disc levels: C2-C3: Bulky right eccentric posterior disc osteophyte complex (series 6, image 12) superimposed on moderate to severe thickening of the ligament flavum and at least moderate  bilateral facet hypertrophy. Degenerative facet joint fluid on the left. Moderate to severe spinal stenosis and spinal cord mass effect (series 6, images 12 and 13 and series 4, image 9) with suspected abnormal cord signal as above. Moderate right and moderate to severe left C3 foraminal stenosis. C3-C4: Severe disc space loss and bulky circumferential disc osteophyte complex eccentric  to the left. Moderate facet and ligament flavum hypertrophy. Moderate spinal stenosis and spinal cord mass effect. Moderate to severe bilateral C4 foraminal stenosis. C4-C5:  Prior ACDF.  No spinal stenosis. C5-C6: Prior ACDF. Mild right paracentral endplate spurring but no significant spinal stenosis. C6-C7:  Prior ACDF.  No spinal stenosis. C7-T1: Anterolisthesis with disc space loss and lobulated left greater than right paracentral and foraminal disc osteophyte complex. Borderline to mild spinal stenosis (series 6, image 35). No spinal cord mass effect. Mild to moderate facet hypertrophy. Mild to moderate bilateral C8 foraminal stenosis. Thoracic spine is detailed separately today. IMPRESSION: 1. 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 2. 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 3. Degenerative moderate, occasionally severe, neural foraminal stenosis at the bilateral C3, C4, C8 nerve levels. Electronically Signed: By: Odessa Fleming M.D. On: 06/09/2021 11:59   MR THORACIC SPINE W WO CONTRAST  Result Date: 06/09/2021 CLINICAL DATA:  61 year old male with progressive extremity ataxia. 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 EXAM: MRI THORACIC WITHOUT AND WITH CONTRAST TECHNIQUE: Multiplanar and multiecho pulse sequences of the thoracic spine were obtained without and with intravenous contrast.  CONTRAST:  6mL GADAVIST GADOBUTROL 1 MMOL/ML IV SOLN in conjunction with contrast enhanced imaging of the cervical spine reported separately. COMPARISON:  Cervical spine MRI today reported separately. Chest CT 09/02/2013. CTA abdomen 06/04/2021. FINDINGS: Limited cervical spine imaging:  Reported separately today. Thoracic spine segmentation: Hypoplastic ribs at T12 but otherwise normal. Alignment: Thoracic kyphosis not significantly changed since 2015. No thoracic spondylolisthesis. Vertebrae: Patchy degenerative endplate marrow edema and enhancement at T8-T9 where severe disc space loss and chronic vacuum disc are noted. See additional details below. Background bone marrow signal within normal limits. No other marrow edema or acute osseous abnormality. Cord: No cord signal abnormality identified despite degenerative cord mass effect at both T8-T9 and T10-T11 (see details below). Conus medullaris appears to be normal at T12-L1. Mildly increased conspicuity of what is felt to be normal anterior and posterior spinal cord vascularity on sagittal postcontrast series 17. No evidence of abnormal leptomeningeal vessels. Paraspinal and other soft tissues: Layering pleural effusions greater on the left. Additional increased signal in the bilateral lower lobes, corresponding to reticulonodular opacity on the portable chest x-ray yesterday. Visible upper abdominal viscera appear stable since last month including evidence of right hydronephrosis. Negative thoracic paraspinal soft tissues. Disc levels: T1-T2: Negative. T2-T3: Disc space loss with circumferential disc bulge, broad-based posterior component. Effaced ventral CSF space but no significant spinal stenosis. Posterior element hypertrophy appears greater on the left with moderate left greater than right T2 foraminal stenosis. T3-T4: Mild disc bulging. No stenosis. T4-T5: Mild disc bulging no stenosis. T5-T6: Negative. T6-T7: Mild disc bulging. No stenosis. Small  bilateral nerve root diverticula, normal variant. T7-T8: Right paracentral small disc protrusion (series 13, image 23). No stenosis. Prominent right side nerve root diverticula appears to be a normal variant (series 13, image 21). T8-T9: Chronic severe disc space loss and vacuum disc here with circumferential disc osteophyte complex on the 2015 CT. Broad-based posterior component superimposed on some posterior element hypertrophy results in mild spinal stenosis and spinal cord mass effect (series 13, image 25). Moderate bilaterals T8 foraminal stenosis. T9-T10: Mild disc bulge. No stenosis. Small nerve root diverticula greater on  the left. T10-T11: Chronic disc space loss. Broad-based posterior disc osteophyte complex superimposed on mild to moderate facet and ligament flavum hypertrophy. Right paracentral component (series 13, image 31) with mild spinal stenosis and cord mass effect. No convincing foraminal stenosis. T11-T12: Circumferential disc bulge with a broad-based posterior component. Mild to moderate facet and ligament flavum hypertrophy. Mild spinal stenosis. No convincing cord mass effect or foraminal stenosis. T12-L1: Negative. IMPRESSION: 1. Pronounced chronic disc and endplate degeneration with posterior element hypertrophy at T8-T9 and T10-T11 with mild spinal stenosis AND mild cord mass effect at both. But no associated spinal cord signal abnormality. Mild spinal stenosis also at T11-T12. 2. Moderate degenerative neural foraminal stenosis at the bilateral T2 and T8 neural nerve levels. 3. Left greater than right layering pleural effusions and lower lobe opacity corresponding to the portable chest x-ray findings yesterday. Salient findings in the spine were discussed by telephone with Dr. Selina Cooley on 06/09/2021 at 12:03 . Electronically Signed   By: Odessa Fleming M.D.   On: 06/09/2021 12:19   CARDIAC CATHETERIZATION  Result Date: 06/01/2021   Prox RCA lesion is 50% stenosed.   Mid Cx lesion is 20% stenosed.    Mid LAD lesion is 20% stenosed. Moderate eccentric stenosis in the mid RCA. This does not appear to be flow limiting Mild disease in the mid LAD and mid Circumflex LVEDP 14 mmHg Recommendations: Medical management of CAD. No culprit lesion is seen.   US RENAL  Result Date: 06/06/2021 CLINICAL DATA:  Right hydronephrosis EXAM: RENAL / URINARY TRACT ULTRASOUND COMPLETE COMPARISON:  06/04/2021 CT abdomen pelvis FINDINGS: Right Kidney: Renal measurements: 11 x 4.7 x 4.4 cm = volume: 120 mL. Echogenicity within normal limits. Moderate hydronephrosis. No mass. Left Kidney: Renal measurements: 11.4 x 6.2 x 4.7 cm = volume: 173 mL. Echogenicity within normal limits. No mass or hydronephrosis visualized. Trace perinephric fluid. Bladder: Appears normal for degree of bladder distention. Other: Incidental note is made of ascites and a left pleural effusion. IMPRESSION: 1. Moderate right hydronephrosis. 2. The left kidney is normal in appearance, with trace perinephric fluid. 3. Left pleural effusion. 4. Ascites. Electronically Signed   By: Wiliam Ke M.D.   On: 06/06/2021 19:42   DG CHEST PORT 1 VIEW  Result Date: 06/15/2021 CLINICAL DATA:  Pleural effusion. EXAM: PORTABLE CHEST 1 VIEW COMPARISON:  06/08/2021 FINDINGS: Lordotic technique is demonstrated. Lungs are adequately inflated with interval improvement in left base opacification likely improving effusion/atelectasis. Remainder of the lungs are clear. Cardiomediastinal silhouette and remainder of the exam is unchanged. IMPRESSION: Improving left base opacification likely improving effusion/atelectasis. Electronically Signed   By: Elberta Fortis M.D.   On: 06/15/2021 09:10   DG CHEST PORT 1 VIEW  Result Date: 06/03/2021 CLINICAL DATA:  Respiratory failure with hypoxia EXAM: PORTABLE CHEST 1 VIEW COMPARISON:  06/02/2021 FINDINGS: Cardiac shadow is stable. Postsurgical changes in the cervical spine are seen. Right lung is clear. Persistent left basilar density  is noted. No acute bony abnormality is seen. IMPRESSION: Stable left basilar atelectasis. Electronically Signed   By: Alcide Clever M.D.   On: 06/03/2021 22:59   DG CHEST PORT 1 VIEW  Result Date: 06/02/2021 CLINICAL DATA:  Shortness of breath EXAM: PORTABLE CHEST 1 VIEW COMPARISON:  05/31/2021 FINDINGS: Transverse diameter of heart is increased. Infiltrate is seen in the medial left lower lung fields with interval worsening. In 1 of two views, there is increased density in the medial right lower lung fields which is less evident in  the second radiograph. There are no signs of alveolar pulmonary edema. Lateral CP angles are clear. There is no pneumothorax. There is previous surgical fusion in the cervical spine. IMPRESSION: There is infiltrate in the medial left lower lung fields suggesting atelectasis/pneumonia in the left lower lobe. Electronically Signed   By: Ernie Avena M.D.   On: 06/02/2021 14:44   DG Abd Portable 1V  Result Date: 06/03/2021 CLINICAL DATA:  Nausea and vomiting. EXAM: PORTABLE ABDOMEN - 1 VIEW COMPARISON:  Jul 22, 2020 FINDINGS: The study is limited secondary to patient rotation. Gastric distension is suspected, however, this is limited in evaluation secondary to previously noted study limitations. The bowel gas pattern is otherwise normal. A large stool burden is seen. No radio-opaque calculi or other significant radiographic abnormality are seen. Postoperative changes seen within the lower lumbar spine. A total right hip replacement is seen without evidence of hardware complication. IMPRESSION: Suspected gastric distension, however, this is limited in evaluation secondary to previously noted study limitations. Correlation with follow-up plain film imaging versus abdomen pelvis CT is recommended if gastric outlet obstruction is of clinical concern. Electronically Signed   By: Aram Candela M.D.   On: 06/03/2021 18:57   ECHOCARDIOGRAM COMPLETE  Result Date: 06/01/2021     ECHOCARDIOGRAM REPORT   Patient Name:   ADRON CURY Date of Exam: 06/01/2021 Medical Rec #:  [id number]   Height:       67.0 in Accession #:    [id number]  Weight:       133.7 lb Date of Birth:  10/24/1960   BSA:          1.704 m Patient Age:    61 years    BP:           99/82 mmHg Patient Gender: M           HR:           94 bpm. Exam Location:  Inpatient Procedure: 2D Echo Indications:    elevated troponin  History:        Patient has prior history of Echocardiogram examinations, most                 recent 01/23/2009. COPD; Risk Factors:Dyslipidemia and Current                 Smoker.  Sonographer:    Delcie Roch RDCS Referring Phys: [id number] Oday Ridings M Reisha Wos IMPRESSIONS  1. Left ventricular ejection fraction, by estimation, is 55 to 60%. The left ventricle has normal function. The left ventricle has no regional wall motion abnormalities. Left ventricular diastolic parameters are indeterminate.  2. Right ventricular systolic function is normal. The right ventricular size is normal. Tricuspid regurgitation signal is inadequate for assessing PA pressure.  3. Left atrial size was mild to moderately dilated.  4. The mitral valve is normal in structure. Mild mitral valve regurgitation. No evidence of mitral stenosis.  5. The aortic valve has an indeterminant number of cusps. There is mild calcification of the aortic valve. Aortic valve regurgitation is not visualized. No aortic stenosis is present.  6. The inferior vena cava is dilated in size with >50% respiratory variability, suggesting right atrial pressure of 8 mmHg. Comparison(s): Prior images unable to be directly viewed, comparison made by report only. Conclusion(s)/Recommendation(s): Normal biventricular function without evidence of hemodynamically significant valvular heart disease. FINDINGS  Left Ventricle: Left ventricular ejection fraction, by estimation, is 55 to 60%. The left ventricle has normal function. The left  ventricle has no regional wall motion  abnormalities. The left ventricular internal cavity size was normal in size. There is  no left ventricular hypertrophy. Left ventricular diastolic parameters are indeterminate. Right Ventricle: The right ventricular size is normal. No increase in right ventricular wall thickness. Right ventricular systolic function is normal. Tricuspid regurgitation signal is inadequate for assessing PA pressure. Left Atrium: Left atrial size was mild to moderately dilated. Right Atrium: Right atrial size was normal in size. Pericardium: There is no evidence of pericardial effusion. Presence of epicardial fat layer. Mitral Valve: The mitral valve is normal in structure. Mild mitral valve regurgitation. No evidence of mitral valve stenosis. Tricuspid Valve: The tricuspid valve is normal in structure. Tricuspid valve regurgitation is trivial. No evidence of tricuspid stenosis. Aortic Valve: The aortic valve has an indeterminant number of cusps. There is mild calcification of the aortic valve. Aortic valve regurgitation is not visualized. No aortic stenosis is present. Pulmonic Valve: The pulmonic valve was not well visualized. Pulmonic valve regurgitation is not visualized. No evidence of pulmonic stenosis. Aorta: The aortic root, ascending aorta, aortic arch and descending aorta are all structurally normal, with no evidence of dilitation or obstruction. Venous: The inferior vena cava is dilated in size with greater than 50% respiratory variability, suggesting right atrial pressure of 8 mmHg. IAS/Shunts: The atrial septum is grossly normal.  LEFT VENTRICLE PLAX 2D LVIDd:         5.10 cm   Diastology LVIDs:         3.60 cm   LV e' medial:  5.40 cm/s LV PW:         0.90 cm   LV e' lateral: 13.20 cm/s LV IVS:        0.90 cm LVOT diam:     2.30 cm LV SV:         69 LV SV Index:   41 LVOT Area:     4.15 cm  RIGHT VENTRICLE             IVC RV Basal diam:  2.80 cm     IVC diam: 2.10 cm RV S prime:     12.10 cm/s TAPSE (M-mode): 1.8 cm LEFT  ATRIUM             Index        RIGHT ATRIUM           Index LA diam:        3.40 cm 2.00 cm/m   RA Area:     14.60 cm LA Vol (A2C):   55.1 ml 32.33 ml/m  RA Volume:   34.60 ml  20.30 ml/m LA Vol (A4C):   62.4 ml 36.62 ml/m LA Biplane Vol: 61.8 ml 36.26 ml/m  AORTIC VALVE LVOT Vmax:   81.90 cm/s LVOT Vmean:  55.900 cm/s LVOT VTI:    0.167 m  AORTA Ao Root diam: 3.60 cm Ao Asc diam:  3.70 cm  SHUNTS Systemic VTI:  0.17 m Systemic Diam: 2.30 cm Jodelle Red MD Electronically signed by Jodelle Red MD Signature Date/Time: 06/01/2021/7:30:34 PM    Final    VAS US CAROTID  Result Date: 06/11/2021 Carotid Arterial Duplex Study Patient Name:  Evette Doffing  Date of Exam:   06/09/2021 Medical Rec #: [id number]    Accession #:    [id number] Date of Birth: [birth year]    Patient Gender: M Patient Age:   29 years Exam Location:  [facility] Procedure:  VAS US CAROTID Referring Phys: [facility] --------------------------------------------------------------------------------  Indications:       CVA. Risk Factors:      Hypertension, hyperlipidemia, current smoker. Limitations        Today's exam was limited due to the patient's respiratory                    variation. Comparison Study:  Prior carotid duplex done 04/10/20 indicated 1-39% ICA                    stenosis, bilaterally. Performing Technologist: Sherren Kerns RVS  Examination Guidelines: A complete evaluation includes B-mode imaging, spectral Doppler, color Doppler, and power Doppler as needed of all accessible portions of each vessel. Bilateral testing is considered an integral part of a complete examination. Limited examinations for reoccurring indications may be performed as noted.  Right Carotid Findings: +----------+--------+--------+--------+------------------+------------------+           PSV cm/sEDV cm/sStenosisPlaque DescriptionComments            +----------+--------+--------+--------+------------------+------------------+ CCA Prox  118     24                                intimal thickening +----------+--------+--------+--------+------------------+------------------+ CCA Distal99      24                                intimal thickening +----------+--------+--------+--------+------------------+------------------+ ICA Prox  92      22      1-39%   heterogenous                         +----------+--------+--------+--------+------------------+------------------+ ICA Distal85      20                                                   +----------+--------+--------+--------+------------------+------------------+ ECA       88      12                                                   +----------+--------+--------+--------+------------------+------------------+ +----------+--------+-------+--------+-------------------+           PSV cm/sEDV cmsDescribeArm Pressure (mmHG) +----------+--------+-------+--------+-------------------+ [id number]                                         +----------+--------+-------+--------+-------------------+ +---------+--------+--+--------+-+ VertebralPSV cm/s63EDV cm/s3 +---------+--------+--+--------+-+  Left Carotid Findings: +----------+--------+--------+--------+------------------+------------------+           PSV cm/sEDV cm/sStenosisPlaque DescriptionComments           +----------+--------+--------+--------+------------------+------------------+ CCA Prox  83      14                                intimal thickening +----------+--------+--------+--------+------------------+------------------+ CCA Distal83      13  intimal thickening +----------+--------+--------+--------+------------------+------------------+ ICA Prox  121     23      1-39%   heterogenous                          +----------+--------+--------+--------+------------------+------------------+ ICA Distal102     20                                tortuous           +----------+--------+--------+--------+------------------+------------------+ ECA       187     5                                                    +----------+--------+--------+--------+------------------+------------------+ +----------+--------+--------+--------------+-------------------+           PSV cm/sEDV cm/sDescribe      Arm Pressure (mmHG) +----------+--------+--------+--------------+-------------------+ Subclavian                Not identified                    +----------+--------+--------+--------------+-------------------+ +---------+--------+--------+--------------------------------------------------+ VertebralPSV cm/sEDV cm/sunable to insonate flow, possibly secondary to                              breathing/body habitus                             +---------+--------+--------+--------------------------------------------------+   Summary: Right Carotid: Velocities in the right ICA are consistent with a 1-39% stenosis. Left Carotid: There is no evidence of stenosis in the left ICA. Vertebrals:  Right vertebral artery demonstrates antegrade flow. Left vertebral              artery was not visualized. Subclavians: Left subclavian artery was not visualized. Normal flow hemodynamics              were seen in the right subclavian artery. *See table(s) above for measurements and observations.  Electronically signed by Delia Heady MD on 06/11/2021 at 2:02:09 PM.    Final    CT ANGIO GI BLEED  Result Date: 06/04/2021 CLINICAL DATA:  Lower GI bleed. EXAM: CTA ABDOMEN AND PELVIS WITHOUT AND WITH CONTRAST TECHNIQUE: Multidetector CT imaging of the abdomen and pelvis was performed using the standard protocol during bolus administration of intravenous contrast. Multiplanar reconstructed images and MIPs were obtained and  reviewed to evaluate the vascular anatomy. RADIATION DOSE REDUCTION: This exam was performed according to the departmental dose-optimization program which includes automated exposure control, adjustment of the mA and/or kV according to patient size and/or use of iterative reconstruction technique. CONTRAST:  OMNIPAQUE IOHEXOL 350 MG/ML SOLN COMPARISON:  CTA aortobifemoral and runoff of 12/06/2020 FINDINGS: VASCULAR Aorta: There is moderate heterogeneous mixed plaque. Some of the soft plaque is ulcerative but no penetrating ulcer is seen or critical stenosis. No aneurysm. No dissection. Celiac: There are minimal nonstenosing calcifications, with scattered non stenosing calcific plaque in the splenic artery. Other branch arteries are clear. SMA: Normal caliber with minimal nonstenosing calcific plaques in the proximal vessel. Renals: Dual left and single right renal arteries left the more cephalad artery is dominant. There are scattered nonstenosing bilateral renal artery calcifications. Neither renal  artery is significantly stenotic. IMA: Severe mixed plaque origin stenosis again noted. The remainder of the vessel opacifies well. Inflow: interval stenting of the previously occluded right common femoral artery, which is now adequately patent. Right internal iliac artery is calcified and occluded, there is moderate patchy mixed plaque in the left common iliac artery without stenosis. Left internal iliac artery demonstrated patchy calcification without significant stenosis. Right external iliac artery with moderate patchy mixed plaque and up to 50% stenosis mid segment, otherwise patent. Similar moderate disease in the left external iliac artery but no more than 40% stenosis mid segment. Proximal Outflow: Right common femoral artery is unremarkable. Left common femoral artery up to 50% stenotic due to mixed plaque. There are new postsurgical changes in the right groin with interval right fem-pop bypass graft, only  the proximal end of which is seen and it is patent. Left superficial femoral artery is moderately diseased and at least 50% stenotic proximally, as before. Veins: The IVC, portal vein and pelvic deep veins are clear. Review of the MIP images confirms the above findings. NON-VASCULAR Lower chest: Interval development of a small right and moderate-sized left pleural effusions, with adjacent consolidation or atelectasis in the left lower lobe. Small circumferential pericardial effusion noted with a cardiac size upper-normal. Pericardial fluid is also new. Hepatobiliary: 19 cm length liver with uniform enhancement. Gallbladder is absent without biliary dilatation. Pancreas: No focal abnormality. Spleen: No focal abnormality.  No splenomegaly. Adrenals/Urinary Tract: There is no adrenal or renal cortical masslike abnormality. There is asymmetric cortical contrast retention on the right with moderate hydroureteronephrosis, etiology unknown as the distal ureter is obscured by a right hip arthroplasty. This was not seen previously nor was any intrarenal stone identified on the prior study. The right lateral and posterior right bladder wall are also obscured. The bladder is normal in thickness where visible. There is no hydronephrosis or stone disease on the left. Stomach/Bowel: There is fold thickening of the stomach with stomach distended with fluid and food products. No GI tract arterial or venous contrast extravasation is seen. There are thickened folds in the left abdominal small bowel, fluid in the ascending colon. Normal caliber appendix is visible. There is moderate stool retention transverse and descending colon. There is no colonic thickening or inflammatory change. Lymphatic: No adenopathy is seen except for slightly prominent chronic retroperitoneal nodes in the gastrohepatic ligament and periportal spaces. Reproductive: Prominent prostate measuring 4.7 cm transverse. Other: Increased body wall anasarca and  mesenteric congestive features, small amount of pelvic ascites and perihepatic ascites new in the interval. Musculoskeletal: Degenerative and postsurgical changes lumbar spine with solid fusion L3-4 and intact posterior hardware, advanced disc collapse and endplate sclerosis L5-S1 with vacuum phenomenon and marginal osteophytes. Right hip arthroplasty. IMPRESSION: VASCULAR 1. No significant stenosis in the SMA with again noted high-grade IMA origin stenosis, unchanged. 2. Aortoiliac atherosclerosis, interval right common iliac arterial stenting and interval right fem-pop bypass graft with the most proximal aspect well patent. Up to 50% mixed plaque stenosis mid right external iliac artery, proximal left SFA. 3. No further changes in the inflow and proximal outflow vessels as described above. 4. The heart size is upper-normal with interval development of a small circumferential pericardial effusion new in the interval. NON-VASCULAR 1. No GI tract arterial or venous contrast extravasation could be seen. 2. Left-greater-than-right pleural effusions with adjacent consolidation or atelectasis in the left lower lobe. 3. Evidence of right obstructive uropathy with moderate hydroureteronephrosis of indeterminate etiology. No intrarenal stone was  previously seen and no ureteral stone is evident through the lowest level visible with the distal right ureter and right bladder wall obscured from the acetabula down due to a right hip arthroplasty. Possible explanations could include a recently passed stone, ascending UTI, or tethering of the ureter where it overlies the proximal right external iliac artery near the end of the interval-inserted arterial stent. Please correlate clinically. 4. Evidence of gastroenteritis without evidence of small-bowel obstruction or inflammation. Fluid in the ascending colon, remainder showing constipation. 5. Distended stomach with fluid and food products which could be due to recent ingestion or  impaired gastric emptying. 6. Mildly prominent lymph nodes in the retroperitoneum, not significantly changed. 7. Increased body wall anasarca and mesenteric congestive features, mild ascites. Electronically Signed   By: Almira Bar M.D.   On: 06/04/2021 04:26    Microbiology: Results for orders placed or performed during the hospital encounter of 05/31/21  Surgical pcr screen     Status: Abnormal   Collection Time: 06/12/21  5:00 AM   Specimen: Nasal Mucosa; Nasal Swab  Result Value Ref Range Status   MRSA, PCR POSITIVE (A) NEGATIVE Final    Comment: RESULT CALLED TO, READ BACK BY AND VERIFIED WITH: C HALL,RN@[id number]  06/12/21 MK    Staphylococcus aureus POSITIVE (A) NEGATIVE Final    Comment: (NOTE) The Xpert SA Assay (FDA approved for NASAL specimens in patients 20 years of age and older), is one component of a comprehensive surveillance program. It is not intended to diagnose infection nor to guide or monitor treatment. Performed at [facility], [address], [place], [place] [address]     Labs: CBC: Recent Labs  Lab 06/12/21 0240 06/13/21 0233 06/13/21 1325 06/14/21 0631 06/15/21 0657  WBC 19.4* 22.4* 16.5* 12.8* 14.2*  NEUTROABS  --   --   --  11.5* 10.4*  HGB 9.1* 9.2* 8.7* 8.9* 8.8*  HCT 30.5* 29.6* 29.0* 29.3* 29.0*  MCV 82.4 82.2 83.6 83.0 83.6  PLT 392 349 322 340 353   Basic Metabolic Panel: Recent Labs  Lab 06/09/21 0255 06/10/21 0325 06/10/21 1051 06/11/21 0148 06/12/21 0240 06/13/21 0233 06/14/21 0631 06/15/21 0657  NA 134*   < >  --  131* 133* 131* 133* 135  K 3.6   < >  --  4.3 4.2 4.1 3.8 3.8  CL 97*   < >  --  92* 93* 93* 96* 98  CO2 28   < >  --  33* 33* 34* 29 31  GLUCOSE 111*   < >  --  68* 72 98 110* 66*  BUN 11   < >  --  10 11 13 10 12   CREATININE 0.68   < >  --  0.59* 0.66 0.73 0.56* 0.58*  CALCIUM 8.1*   < >  --  7.9* 8.1* 7.9* 7.8* 7.9*  MG  --   --  1.8  --   --   --  1.8 1.9  PHOS 3.6  --   --   --   --   --   --   --     < > = values in this interval not displayed.   Liver Function Tests: Recent Labs  Lab 06/09/21 0255 06/10/21 0325 06/13/21 0233 06/14/21 0631 06/15/21 0657  AST  --  19 19 16 16   ALT  --  25 23 18 21   ALKPHOS  --  82 90 88 86  BILITOT  --  <  0.1* <0.1* 0.1* 0.3  PROT  --  4.3* 5.0* 5.0* 5.0*  ALBUMIN 1.8* 1.6* 1.7* 1.7* 1.8*   CBG: Recent Labs  Lab 06/14/21 1150 06/14/21 1625 06/14/21 2158 06/15/21 0751 06/15/21 1150  GLUCAP 170* 225* 109* 70 155*    Discharge time spent: greater than 30 minutes.  Signed: Lynden Oxford, MD [facility]

## 2021-06-15 NOTE — TOC Transition Note (Addendum)
Transition of Care (TOC) - CM/SW Discharge Note ? ? ?Patient Details  ?Name: Raymond [name] ?MRN: [id number] ?Date of Birth: 1961-01-31 ? ?Transition of Care (TOC) CM/SW Contact:  ?Milas Gain, LCSWA ?Phone Number: ?06/15/2021, 12:47 PM ? ? ?Clinical Narrative:    ? ?Update- CSW spoke with [place] with [facility] and made referral for palliative services to follow patient at [facility]. ? ?CSW received consult for palliative services to follow patient at SNF. CSW spoke with patient and patient gave CSW permission to make palliative referral to [facility] for palliative services to follow patient at snf, CSW called [facility],no answer. CSW left voicemail and awaiting callback. ? ?Patient will DC to: [place]  ? ?Anticipated DC date: 06/15/2021 ? ?Family notified: Jodi-CSW LVM CSW informed patient. ? ?Transport by: [facility]  ? ?? ? ?Per MD patient ready for DC to [facility] with palliative services to follow . RN, patient, patient's family, and facility notified of DC. Discharge Summary sent to facility. RN given number for report tele# [phone] RM#[phone] Bed 1. DC packet on chart. Ambulance transport requested for patient. ? ?CSW signing off.  ? ? ?Final next level of care: [place] ?Barriers to Discharge: No Barriers Identified ? ? ?Patient Goals and CMS Choice ?Patient states their goals for this hospitalization and ongoing recovery are:: SNF ?CMS Medicare.gov Compare Post Acute Care list provided to:: Patient ?Choice offered to / list presented to : Patient ? ?Discharge Placement ?  ?           ?Patient chooses bed at:  [facility]) ?Patient to be transferred to facility by: [facility] ?Name of family member notified: [name] ?Patient and family notified of of transfer: 06/15/21 ? ?Discharge Plan and Services ?In-house Referral: Clinical Social Work ?  ?           ?  ?  ?  ?  ?  ?  ?  ?  ?  ?  ? ?Social Determinants of Health (SDOH)  Interventions ?  ? ? ?Readmission Risk Interventions ? ?  12/13/2020  ?  2:02 PM  ?Readmission Risk Prevention Plan  ?Transportation Screening Complete  ?PCP or Specialist Appt within 5-7 Days Complete  ?Home Care Screening Complete  ?Medication Review (RN CM) Complete  ? ? ? ? ? ?

## 2021-06-15 NOTE — Progress Notes (Signed)
OT Cancellation Note ? ?Patient Details ?Name: Raymond [name] ?MRN: [id number] ?DOB: [birth year] ? ? ?Cancelled Treatment:    Reason Eval/Treat Not Completed: Other (comment) (transport arriving with OT to tranfer to SNF) OT to defer all further needs to the facility ? ?Jeri Modena ?06/15/2021, 2:59 PM ?

## 2021-06-15 NOTE — Consult Note (Signed)
?Palliative Medicine Inpatient Consult Note ? ?Consulting Provider: Lavina Hamman, MD ? ?Reason for consult:   ?Raymond [name] Palliative Medicine Consult  ?Reason for Consult? Goals of care conversation  ? ?HPI:  ?Per intake H&P --> 61 y.o. M with a h/o CAD SP PCI, COPD, chronic respiratory failure, GERD, active smoker, PVD, type II DM. Hospitalized for chest pain cardiac catheterization on 3/24 which showed nonobstructive CAD managed medically. Had an e/o melana EGD on 3/29 that showed large cratered ulcer in distal third of stomach on PPI with serial Hgb/Hct's. Weakness - MRI brain w. Small infact maganed medically. Had an MRI C-Spine w/ spine shows severe spinal stenosis with mass effect on cord was not stable enough for surgery on 4/4, so neurosurgery will follow along. ? ?Palliative care has been asked to discuss goals of care in the setting of several comorbidities and a prolonged/complicated hospitalization.  ? ?Clinical Assessment/Goals of Care: ? ?*Please note that this is a verbal dictation therefore any spelling or grammatical errors are due to the "Progress One" system interpretation. ? ?I have reviewed medical records including EPIC notes, labs and imaging, received report from bedside RN, assessed the patient.  ?  ?I met with Raymond [name] to further discuss diagnosis prognosis, GOC, EOL wishes, disposition and options. ?  ?I introduced Palliative Medicine as specialized medical care for people living with serious illness. It focuses on providing relief from the symptoms and stress of a serious illness. The goal is to improve quality of life for both the patient and the family. ? ?Medical History Review and Understanding: ? ?Raymond [name] shares that he has had a "rough year" and suffered a fall in the "tub" six months ago - since then it has been "all down hill".  ? ?We reviewed Raymond [name]'s medical history inclusive of his five prior back surgeries and his right hip surgery. We discussed that  he had COPD and is dependent on 2.5LPM of O2 at home.  ? ?Raymond [name] understands that he has had a complicated hospitalization. He realizes that he will need to have another spine surgery in the near future. He expresses frustration that it could not be done two days ago due to his low blood pressure.  ? ?Social History: ? ?Raymond [name] is from [place], [place]. He has been married twice. His first wife dies of cancer and his second wife divorced him. He has four children, two adopted children, and 13 grandchildren.  He used to work as a Forensic psychologist.  He shares that his days consist of sitting in a recliner and watching television and expresses that he does not have much joy in his life any longer.  He shares he used to like to tinker with cars in his driveway.  He is a man of faith though is nondenominational. ? ?Functional and Nutritional State: ? ?Prior to hospitalization Raymond [name] was living in a single-family home performing activities such as mobilizing with a walker, needing for himself and dressing himself.  He required assistance with other activities though his daughter and daughter-in-law were able to support bringing him meals. ? ?Raymond [name] has a fair appetite per self-report. ? ?Palliative Symptoms: ? ?Depression-patient has been on Wellbutrin for "a few months now.  He does not feel its helping. ? ?Anxiety-patient takes Xanax though he shares his primary care provider has stopped ordering it for him and he believes it is the only thing that curtails his anxiety. ? ?Pain-this is constant and unremitting characteristically a dullness.  He does have incremental sharp burning and tingling from his cervical spine to his feet. Percocet helps - he shares that he has never been on a long acting medication.  ? ?Dyspnea-patient's on 3 L nasal cannula which is an increase from his home 2.5 L nasal cannula.  Receiving breathing treatments which are also helpful. ? ?Insomnia-unable to get a good nights rest is not on a dedicated  sleep aid at this time. ? ?Advance Directives: ?A detailed discussion was had today regarding advanced directives.  Patient's daughter-Raymond [name] is his primary Media planner. ? ?Code Status: ? ?Encouraged patient/family to consider DNR/DNI status understanding evidenced based poor outcomes in similar hospitalized patient, as the cause of arrest is likely associated with advanced chronic/terminal illness rather than an easily reversible acute cardio-pulmonary event. I explained that DNR/DNI does not change the medical plan and it only comes into effect after a person has arrested (died).  It is a protective measure to keep [place] from harming the patient in their last moments of life. Raymond [name] was agreeable to further consider this. He is willing to think about it and discuss it with his family.  ? ?Discussion: ? ?Raymond [name] and I reviewed his poor health state and the concern in the setting of his prolonged hospitalization.  We reviewed his best case/worst case scenarios and the idea of him not recovering to the goal of being able to walk and care for himself independently.  He shares he is in building a tiny home and he is hopeful to get strong enough to be able to live and that is it is wheelchair accessible.  He expresses if he does not get to that point his daughter, Raymond [name] would take him in. ? ?Zeric is aware that long-term he may not fare well and shares that he had already told his daughter "my days are limited" in his wishes for cremation. ? ?Reviewed the ongoing need for conversations in the future which she is open to. ? ?Discussed the importance of continued conversation with family and their  medical providers regarding overall plan of care and treatment options, ensuring decisions are within the context of the patients values and GOCs. ? ?Decision Maker: ?Raymond [name] (daughter): [phone] ? ?SUMMARY OF RECOMMENDATIONS   ?Full Code --> For the time being patient will give this more thought ? ?Palliative symptom  management as below ? ?OP Palliative Support on discharge ? ?Palliative care will continue to follow while inpatient ? ?Code Status/Advance Care Planning: ?FULL CODE ? ?Symptom Management:  ?Depression: ?- Wellbutrin --> Plan to taper off over two weeks then start low dose cymbalta 80m PO QDay ? ?Anxiety --> Continue Xanax ? ?Pain--> Continue percocet, would likely benefit from low dose OxyContin though will defer to OP Palliative as he is discharging today. ?Continue Gabapentin - can increase PM dose to 3025m   ? ?Dyspnea-->Continue supplemental O2. ? ?Insomnia- Trazodone 2568mO QHS PRN ? ?Palliative Prophylaxis:  ?Aspiration, Bowel Regimen, Delirium Protocol, Frequent Pain Assessment, Oral Care, Palliative Wound Care, and Turn Reposition ? ?Additional Recommendations (Limitations, Scope, Preferences): ?Continue current care ? ?Psycho-social/Spiritual:  ?Desire for further Chaplaincy support: No ?Additional Recommendations: Education on chronic disease process ?  ?Prognosis: Very high 12 month mortality risk in the setting of co-morbidities, worsening frailty,  ? ?Discharge Planning: Discharge to SNF when medically optimized ? ?Vitals:  ? 06/15/21 0026 06/15/21 0700  ?BP: 103/75 118/69  ?Pulse: 93 93  ?Resp:  15  ?Temp: 98.6 ?F (37 ?C) 98.5 ?F (36.9 ?  C)  ?SpO2: 100% 98%  ? ? ?Intake/Output Summary (Last 24 hours) at 06/15/2021 0709 ?Last data filed at 06/15/2021 0701 ?Gross per 24 hour  ?Intake 850 ml  ?Output 2850 ml  ?Net -2000 ml  ? ?Last Weight  Most recent update: 06/12/2021 11:58 AM  ? ? Weight  ?63.3 kg (139 lb 8.8 oz)  ?      ? ?  ? ?Gen:  Frail elderly M in moderate distress ?HEENT: moist mucous membranes ?CV: Regular rate and rhythm ?PULM: On 3LPM [place] ?ABD: soft/nontender ?EXT: No edema ?Neuro: Alert and oriented x3 ? ?PPS: 30% ? ? ?This conversation/these recommendations were discussed with patient primary care team, Dr. Posey Pronto ? ?[facility] ?______________________________________________________ ?Tacey Ruiz ?[place] Team ?Team Cell Phone: [phone] ?Please utilize secure chat with additional questions, if there is no response within 30 minutes please call the above phone number ?

## 2021-06-15 NOTE — TOC Progression Note (Signed)
Transition of Care (TOC) - Progression Note  ? ? ?Patient Details  ?Name: Raymond [name] ?MRN: [id number] ?Date of Birth: 05-Nov-1960 ? ?Transition of Care (TOC) CM/SW Contact  ?Milas Gain, LCSWA ?Phone Number: ?06/15/2021, 10:00 AM ? ?Clinical Narrative:    ? ?Patients insurance authorization has been approved. Patient has SNF bed at [facility] when medically ready. CSW will continue to follow and assist with patients dc planning needs. ? ?Expected Discharge Plan: [place] ?Barriers to Discharge: Continued Medical Work up ? ?Expected Discharge Plan and Services ?Expected Discharge Plan: [place] ?In-house Referral: Clinical Social Work ?  ?  ?Living arrangements for the past 2 months: [place] ?                ?  ?  ?  ?  ?  ?  ?  ?  ?  ?  ? ? ?Social Determinants of Health (SDOH) Interventions ?  ? ?Readmission Risk Interventions ? ?  12/13/2020  ?  2:02 PM  ?Readmission Risk Prevention Plan  ?Transportation Screening Complete  ?PCP or Specialist Appt within 5-7 Days Complete  ?Home Care Screening Complete  ?Medication Review (RN CM) Complete  ? ? ?

## 2021-06-17 DIAGNOSIS — W06XXXA Fall from bed, initial encounter: Secondary | ICD-10-CM | POA: Diagnosis not present

## 2021-06-17 DIAGNOSIS — Z8673 Personal history of transient ischemic attack (TIA), and cerebral infarction without residual deficits: Secondary | ICD-10-CM | POA: Diagnosis not present

## 2021-06-17 DIAGNOSIS — S4991XA Unspecified injury of right shoulder and upper arm, initial encounter: Secondary | ICD-10-CM | POA: Diagnosis not present

## 2021-06-17 DIAGNOSIS — J449 Chronic obstructive pulmonary disease, unspecified: Secondary | ICD-10-CM | POA: Diagnosis not present

## 2021-06-17 DIAGNOSIS — M25511 Pain in right shoulder: Secondary | ICD-10-CM | POA: Diagnosis not present

## 2021-06-17 DIAGNOSIS — E119 Type 2 diabetes mellitus without complications: Secondary | ICD-10-CM | POA: Diagnosis not present

## 2021-06-17 DIAGNOSIS — I252 Old myocardial infarction: Secondary | ICD-10-CM | POA: Diagnosis not present

## 2021-06-17 DIAGNOSIS — S0990XA Unspecified injury of head, initial encounter: Secondary | ICD-10-CM | POA: Diagnosis not present

## 2021-06-17 DIAGNOSIS — Z043 Encounter for examination and observation following other accident: Secondary | ICD-10-CM | POA: Diagnosis not present

## 2021-06-17 DIAGNOSIS — M47812 Spondylosis without myelopathy or radiculopathy, cervical region: Secondary | ICD-10-CM | POA: Diagnosis not present

## 2021-06-17 DIAGNOSIS — M542 Cervicalgia: Secondary | ICD-10-CM | POA: Diagnosis not present

## 2021-06-17 DIAGNOSIS — G8929 Other chronic pain: Secondary | ICD-10-CM | POA: Diagnosis not present

## 2021-06-18 DIAGNOSIS — R262 Difficulty in walking, not elsewhere classified: Secondary | ICD-10-CM | POA: Diagnosis not present

## 2021-06-18 DIAGNOSIS — R339 Retention of urine, unspecified: Secondary | ICD-10-CM | POA: Diagnosis not present

## 2021-06-18 DIAGNOSIS — I69928 Other speech and language deficits following unspecified cerebrovascular disease: Secondary | ICD-10-CM | POA: Diagnosis not present

## 2021-06-18 DIAGNOSIS — M47812 Spondylosis without myelopathy or radiculopathy, cervical region: Secondary | ICD-10-CM | POA: Diagnosis not present

## 2021-06-18 DIAGNOSIS — Z8673 Personal history of transient ischemic attack (TIA), and cerebral infarction without residual deficits: Secondary | ICD-10-CM | POA: Diagnosis not present

## 2021-06-18 DIAGNOSIS — I10 Essential (primary) hypertension: Secondary | ICD-10-CM | POA: Diagnosis not present

## 2021-06-18 DIAGNOSIS — M6281 Muscle weakness (generalized): Secondary | ICD-10-CM | POA: Diagnosis not present

## 2021-06-18 DIAGNOSIS — F5105 Insomnia due to other mental disorder: Secondary | ICD-10-CM | POA: Diagnosis not present

## 2021-06-18 DIAGNOSIS — J449 Chronic obstructive pulmonary disease, unspecified: Secondary | ICD-10-CM | POA: Diagnosis not present

## 2021-06-18 DIAGNOSIS — F419 Anxiety disorder, unspecified: Secondary | ICD-10-CM | POA: Diagnosis not present

## 2021-06-18 DIAGNOSIS — E119 Type 2 diabetes mellitus without complications: Secondary | ICD-10-CM | POA: Diagnosis not present

## 2021-06-18 DIAGNOSIS — R451 Restlessness and agitation: Secondary | ICD-10-CM | POA: Diagnosis not present

## 2021-06-18 DIAGNOSIS — H01004 Unspecified blepharitis left upper eyelid: Secondary | ICD-10-CM | POA: Diagnosis not present

## 2021-06-18 DIAGNOSIS — H01001 Unspecified blepharitis right upper eyelid: Secondary | ICD-10-CM | POA: Diagnosis not present

## 2021-06-18 DIAGNOSIS — I469 Cardiac arrest, cause unspecified: Secondary | ICD-10-CM | POA: Diagnosis not present

## 2021-06-18 DIAGNOSIS — E1165 Type 2 diabetes mellitus with hyperglycemia: Secondary | ICD-10-CM | POA: Diagnosis not present

## 2021-06-18 DIAGNOSIS — F411 Generalized anxiety disorder: Secondary | ICD-10-CM | POA: Diagnosis not present

## 2021-06-18 DIAGNOSIS — R5381 Other malaise: Secondary | ICD-10-CM | POA: Diagnosis not present

## 2021-06-18 DIAGNOSIS — R0609 Other forms of dyspnea: Secondary | ICD-10-CM | POA: Diagnosis not present

## 2021-06-18 DIAGNOSIS — G459 Transient cerebral ischemic attack, unspecified: Secondary | ICD-10-CM | POA: Diagnosis not present

## 2021-06-18 DIAGNOSIS — L97909 Non-pressure chronic ulcer of unspecified part of unspecified lower leg with unspecified severity: Secondary | ICD-10-CM | POA: Diagnosis not present

## 2021-06-18 DIAGNOSIS — Z79899 Other long term (current) drug therapy: Secondary | ICD-10-CM | POA: Diagnosis not present

## 2021-06-18 DIAGNOSIS — B351 Tinea unguium: Secondary | ICD-10-CM | POA: Diagnosis not present

## 2021-06-18 DIAGNOSIS — L97412 Non-pressure chronic ulcer of right heel and midfoot with fat layer exposed: Secondary | ICD-10-CM | POA: Diagnosis not present

## 2021-06-18 DIAGNOSIS — N390 Urinary tract infection, site not specified: Secondary | ICD-10-CM | POA: Diagnosis not present

## 2021-06-18 DIAGNOSIS — M542 Cervicalgia: Secondary | ICD-10-CM | POA: Diagnosis not present

## 2021-06-18 DIAGNOSIS — E785 Hyperlipidemia, unspecified: Secondary | ICD-10-CM | POA: Diagnosis not present

## 2021-06-18 DIAGNOSIS — J8483 Surfactant mutations of the lung: Secondary | ICD-10-CM | POA: Diagnosis not present

## 2021-06-18 DIAGNOSIS — J9621 Acute and chronic respiratory failure with hypoxia: Secondary | ICD-10-CM | POA: Diagnosis not present

## 2021-06-18 DIAGNOSIS — I499 Cardiac arrhythmia, unspecified: Secondary | ICD-10-CM | POA: Diagnosis not present

## 2021-06-18 DIAGNOSIS — M25511 Pain in right shoulder: Secondary | ICD-10-CM | POA: Diagnosis not present

## 2021-06-18 DIAGNOSIS — R0902 Hypoxemia: Secondary | ICD-10-CM | POA: Diagnosis not present

## 2021-06-18 DIAGNOSIS — F331 Major depressive disorder, recurrent, moderate: Secondary | ICD-10-CM | POA: Diagnosis not present

## 2021-06-18 DIAGNOSIS — T8789 Other complications of amputation stump: Secondary | ICD-10-CM | POA: Diagnosis not present

## 2021-06-18 DIAGNOSIS — R404 Transient alteration of awareness: Secondary | ICD-10-CM | POA: Diagnosis not present

## 2021-06-18 DIAGNOSIS — R079 Chest pain, unspecified: Secondary | ICD-10-CM | POA: Diagnosis not present

## 2021-06-18 DIAGNOSIS — I509 Heart failure, unspecified: Secondary | ICD-10-CM | POA: Diagnosis not present

## 2021-06-18 DIAGNOSIS — I69991 Dysphagia following unspecified cerebrovascular disease: Secondary | ICD-10-CM | POA: Diagnosis not present

## 2021-06-18 DIAGNOSIS — R369 Urethral discharge, unspecified: Secondary | ICD-10-CM | POA: Diagnosis not present

## 2021-06-18 DIAGNOSIS — G8929 Other chronic pain: Secondary | ICD-10-CM | POA: Diagnosis not present

## 2021-06-18 DIAGNOSIS — Z7401 Bed confinement status: Secondary | ICD-10-CM | POA: Diagnosis not present

## 2021-06-18 DIAGNOSIS — Z960 Presence of urogenital implants: Secondary | ICD-10-CM | POA: Diagnosis not present

## 2021-06-18 DIAGNOSIS — F17200 Nicotine dependence, unspecified, uncomplicated: Secondary | ICD-10-CM | POA: Diagnosis not present

## 2021-06-18 DIAGNOSIS — H524 Presbyopia: Secondary | ICD-10-CM | POA: Diagnosis not present

## 2021-06-18 DIAGNOSIS — I739 Peripheral vascular disease, unspecified: Secondary | ICD-10-CM | POA: Diagnosis not present

## 2021-06-18 DIAGNOSIS — H40013 Open angle with borderline findings, low risk, bilateral: Secondary | ICD-10-CM | POA: Diagnosis not present

## 2021-06-18 DIAGNOSIS — E1159 Type 2 diabetes mellitus with other circulatory complications: Secondary | ICD-10-CM | POA: Diagnosis not present

## 2021-06-18 DIAGNOSIS — K250 Acute gastric ulcer with hemorrhage: Secondary | ICD-10-CM | POA: Diagnosis not present

## 2021-06-18 DIAGNOSIS — H2513 Age-related nuclear cataract, bilateral: Secondary | ICD-10-CM | POA: Diagnosis not present

## 2021-06-18 DIAGNOSIS — R600 Localized edema: Secondary | ICD-10-CM | POA: Diagnosis not present

## 2021-06-18 DIAGNOSIS — L89152 Pressure ulcer of sacral region, stage 2: Secondary | ICD-10-CM | POA: Diagnosis not present

## 2021-06-18 DIAGNOSIS — I2510 Atherosclerotic heart disease of native coronary artery without angina pectoris: Secondary | ICD-10-CM | POA: Diagnosis not present

## 2021-06-18 DIAGNOSIS — E559 Vitamin D deficiency, unspecified: Secondary | ICD-10-CM | POA: Diagnosis not present

## 2021-06-18 DIAGNOSIS — I252 Old myocardial infarction: Secondary | ICD-10-CM | POA: Diagnosis not present

## 2021-06-18 DIAGNOSIS — Z716 Tobacco abuse counseling: Secondary | ICD-10-CM | POA: Diagnosis not present

## 2021-06-18 DIAGNOSIS — I639 Cerebral infarction, unspecified: Secondary | ICD-10-CM | POA: Diagnosis not present

## 2021-06-18 DIAGNOSIS — G959 Disease of spinal cord, unspecified: Secondary | ICD-10-CM | POA: Diagnosis not present

## 2021-06-18 DIAGNOSIS — R001 Bradycardia, unspecified: Secondary | ICD-10-CM | POA: Diagnosis not present

## 2021-06-19 DIAGNOSIS — J8483 Surfactant mutations of the lung: Secondary | ICD-10-CM | POA: Diagnosis not present

## 2021-06-19 DIAGNOSIS — R5381 Other malaise: Secondary | ICD-10-CM | POA: Diagnosis not present

## 2021-06-19 DIAGNOSIS — I2510 Atherosclerotic heart disease of native coronary artery without angina pectoris: Secondary | ICD-10-CM | POA: Diagnosis not present

## 2021-06-19 DIAGNOSIS — R0609 Other forms of dyspnea: Secondary | ICD-10-CM | POA: Diagnosis not present

## 2021-06-21 DIAGNOSIS — F17200 Nicotine dependence, unspecified, uncomplicated: Secondary | ICD-10-CM | POA: Diagnosis not present

## 2021-06-21 DIAGNOSIS — R5381 Other malaise: Secondary | ICD-10-CM | POA: Diagnosis not present

## 2021-06-21 DIAGNOSIS — F419 Anxiety disorder, unspecified: Secondary | ICD-10-CM | POA: Diagnosis not present

## 2021-06-21 DIAGNOSIS — Z716 Tobacco abuse counseling: Secondary | ICD-10-CM | POA: Diagnosis not present

## 2021-06-21 DIAGNOSIS — I10 Essential (primary) hypertension: Secondary | ICD-10-CM | POA: Diagnosis not present

## 2021-06-21 DIAGNOSIS — E119 Type 2 diabetes mellitus without complications: Secondary | ICD-10-CM | POA: Diagnosis not present

## 2021-06-21 DIAGNOSIS — E559 Vitamin D deficiency, unspecified: Secondary | ICD-10-CM | POA: Diagnosis not present

## 2021-06-25 DIAGNOSIS — I2510 Atherosclerotic heart disease of native coronary artery without angina pectoris: Secondary | ICD-10-CM | POA: Diagnosis not present

## 2021-06-25 DIAGNOSIS — F419 Anxiety disorder, unspecified: Secondary | ICD-10-CM | POA: Diagnosis not present

## 2021-06-25 DIAGNOSIS — R079 Chest pain, unspecified: Secondary | ICD-10-CM | POA: Diagnosis not present

## 2021-06-25 DIAGNOSIS — R451 Restlessness and agitation: Secondary | ICD-10-CM | POA: Diagnosis not present

## 2021-06-27 ENCOUNTER — Other Ambulatory Visit: Payer: Self-pay | Admitting: Cardiology

## 2021-06-27 DIAGNOSIS — E1159 Type 2 diabetes mellitus with other circulatory complications: Secondary | ICD-10-CM | POA: Diagnosis not present

## 2021-06-27 DIAGNOSIS — B351 Tinea unguium: Secondary | ICD-10-CM | POA: Diagnosis not present

## 2021-06-27 DIAGNOSIS — L89152 Pressure ulcer of sacral region, stage 2: Secondary | ICD-10-CM | POA: Diagnosis not present

## 2021-06-27 DIAGNOSIS — L97412 Non-pressure chronic ulcer of right heel and midfoot with fat layer exposed: Secondary | ICD-10-CM | POA: Diagnosis not present

## 2021-06-28 DIAGNOSIS — F331 Major depressive disorder, recurrent, moderate: Secondary | ICD-10-CM | POA: Diagnosis not present

## 2021-06-28 DIAGNOSIS — F411 Generalized anxiety disorder: Secondary | ICD-10-CM | POA: Diagnosis not present

## 2021-06-28 DIAGNOSIS — F5105 Insomnia due to other mental disorder: Secondary | ICD-10-CM | POA: Diagnosis not present

## 2021-06-29 DIAGNOSIS — N390 Urinary tract infection, site not specified: Secondary | ICD-10-CM | POA: Diagnosis not present

## 2021-07-03 DIAGNOSIS — R600 Localized edema: Secondary | ICD-10-CM | POA: Diagnosis not present

## 2021-07-03 DIAGNOSIS — E119 Type 2 diabetes mellitus without complications: Secondary | ICD-10-CM | POA: Diagnosis not present

## 2021-07-04 DIAGNOSIS — L89152 Pressure ulcer of sacral region, stage 2: Secondary | ICD-10-CM | POA: Diagnosis not present

## 2021-07-05 ENCOUNTER — Ambulatory Visit (INDEPENDENT_AMBULATORY_CARE_PROVIDER_SITE_OTHER): Payer: Medicare HMO | Admitting: Student

## 2021-07-05 ENCOUNTER — Encounter: Payer: Self-pay | Admitting: Student

## 2021-07-05 VITALS — BP 96/54 | HR 72 | Ht 67.0 in | Wt 121.0 lb

## 2021-07-05 DIAGNOSIS — I739 Peripheral vascular disease, unspecified: Secondary | ICD-10-CM | POA: Diagnosis not present

## 2021-07-05 DIAGNOSIS — L97909 Non-pressure chronic ulcer of unspecified part of unspecified lower leg with unspecified severity: Secondary | ICD-10-CM | POA: Diagnosis not present

## 2021-07-05 DIAGNOSIS — H40013 Open angle with borderline findings, low risk, bilateral: Secondary | ICD-10-CM | POA: Diagnosis not present

## 2021-07-05 DIAGNOSIS — I2510 Atherosclerotic heart disease of native coronary artery without angina pectoris: Secondary | ICD-10-CM

## 2021-07-05 DIAGNOSIS — I10 Essential (primary) hypertension: Secondary | ICD-10-CM

## 2021-07-05 DIAGNOSIS — Z79899 Other long term (current) drug therapy: Secondary | ICD-10-CM | POA: Diagnosis not present

## 2021-07-05 DIAGNOSIS — H524 Presbyopia: Secondary | ICD-10-CM | POA: Diagnosis not present

## 2021-07-05 DIAGNOSIS — K250 Acute gastric ulcer with hemorrhage: Secondary | ICD-10-CM

## 2021-07-05 DIAGNOSIS — R600 Localized edema: Secondary | ICD-10-CM | POA: Diagnosis not present

## 2021-07-05 DIAGNOSIS — E785 Hyperlipidemia, unspecified: Secondary | ICD-10-CM | POA: Diagnosis not present

## 2021-07-05 DIAGNOSIS — H2513 Age-related nuclear cataract, bilateral: Secondary | ICD-10-CM | POA: Diagnosis not present

## 2021-07-05 DIAGNOSIS — H01001 Unspecified blepharitis right upper eyelid: Secondary | ICD-10-CM | POA: Diagnosis not present

## 2021-07-05 DIAGNOSIS — H01004 Unspecified blepharitis left upper eyelid: Secondary | ICD-10-CM | POA: Diagnosis not present

## 2021-07-05 NOTE — Patient Instructions (Signed)
Medication Instructions:  ?Your physician recommends that you continue on your current medications as directed. Please refer to the Current Medication list given to you today. ? ? ?Labwork: ?CBC,BMET ? ?Testing/Procedures: ?None  ? ?Follow-Up: ?6 months ? ?Any Other Special Instructions Will Be Listed Below (If Applicable). ? ?If you need a refill on your cardiac medications before your next appointment, please call your pharmacy. ? ?

## 2021-07-05 NOTE — Progress Notes (Signed)
? ?Cardiology Office Note   ? ?Date:  07/05/2021  ? ?ID:  Raymond [name], DOB [birth year], MRN [id number] ? ?PCP:  [name], MD  ?Cardiologist: Carlyle Dolly, MD   ? ?Chief Complaint  ?Patient presents with  ? Hospitalization Follow-up  ? ? ?History of Present Illness:   ? ?Raymond [name] is a 61 y.o. male with past medical history of CAD (s/p reported prior stent placement in 2010 with details unavailable), chronic hypoxic respiratory failure, PAD (s/p right fem-pop bypass and ray amputation of fifth toe in 11/2020), HTN, HLD, Type II DM, GERD, bipolar disorder, COPD and chronic pain who presents to the office today for hospital follow-up. ? ?He was most recently admitted to [facility] in 05/2021 for evaluation of worsening fatigue along with chest pain and dyspnea on exertion for the past 3 to 4 weeks. Hs Troponin values had peaked at 107 at [facility] prior to transfer and were at 55 while at [facility]. He underwent cardiac catheterization for definitive evaluation which showed nonobstructive disease with 50% mid RCA stenosis, 20% mid LCx stenosis and 20% mid LAD stenosis. He was restarted on Lopressor 12.5 mg twice daily and continued on ASA and Plavix (on Plavix on for PVD).  Crestor was titrated to 40 mg daily.  During admission, he was also treated for a COPD exacerbation secondary to community-acquired pneumonia. Later during admission, he was found to have acute blood loss anemia and underwent EGD which showed a large cratered ulcer in the distal third of his stomach. GI did recommend holding Aspirin and Plavix for 7 days and continue on PPI therapy.  ? ?In talking with the patient today, he is currently residing at [facility] but is working with PT daily to try to improve his strength as he is hopeful to return home soon. He is mostly in a wheelchair throughout the day. He denies any recurrent chest pain or recent dyspnea on exertion. No specific orthopnea or PND. He does experience intermittent lower  extremity edema and was previously on Lasix but experienced nausea with this, therefore he was recently switched to Torsemide and has tolerated well thus far. He continues to smoke but has reduced his use to less than 2 cigarettes per day.  ? ? ?Past Medical History:  ?Diagnosis Date  ? Anxiety   ? Back pain   ? Bipolar disorder ([place])   ? Borderline hypertension   ? Chronic pain   ? COPD (chronic obstructive pulmonary disease) ([place])   ? Depression with anxiety   ? GERD (gastroesophageal reflux disease)   ? Headache   ? Hip pain   ? Hypercholesterolemia   ? 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 Stroke ([place]) 11/10/2013  ? Tobacco use   ? ? ?Past Surgical History:  ?Procedure Laterality Date  ? AMPUTATION Right 12/08/2020  ? Procedure: RAY AMPUTATION OF RIGHT FIFTH TOE;  Surgeon: Angelia Mould, MD;  Location: [facility] OR;  Service: Vascular;  Laterality: Right;  ? ANGIOPLASTY ILLIAC ARTERY Right 12/08/2020  ? Procedure: ILIAC ANGIOPLASTY WITH INSERTION OF RIGHT COMMON ILIAC 8MM X 79MM VIABAHN STENT;  Surgeon: Angelia Mould, MD;  Location: [place];  Service: Vascular;  Laterality: Right;  ? ANTERIOR FUSION CERVICAL SPINE    ? BACK SURGERY    ? BIOPSY  06/06/2021  ? Procedure: BIOPSY;  Surgeon: Juanita Craver, MD;  Location: [facility] ENDOSCOPY;  Service: Gastroenterology;;  ? CHOLECYSTECTOMY    ? COLONOSCOPY N/A 10/28/2012  ? [id number]  polyp-hyperplastic. Internal hemorrhoids. Distal 5 cm of terminal ileum appeared normal.  ? CORONARY ANGIOPLASTY WITH STENT PLACEMENT  1980s  ? ENDARTERECTOMY FEMORAL Right 12/08/2020  ? Procedure: RIGHT FEMORAL ENDARTERECTOMY WITH ILIOFEMORAL THROMBECTOMY;  Surgeon: Angelia Mould, MD;  Location: [facility] OR;  Service: Vascular;  Laterality: Right;  ? ESOPHAGOGASTRODUODENOSCOPY N/A 10/28/2012  ? [id number] gastric mucosa with mottling and submucosal petechiae. Mild chronic gastritis but no H. pylori home path. Small hiatal hernia. Duodenal lipoma  ?  ESOPHAGOGASTRODUODENOSCOPY (EGD) WITH PROPOFOL N/A 06/06/2021  ? Procedure: ESOPHAGOGASTRODUODENOSCOPY (EGD) WITH PROPOFOL;  Surgeon: Juanita Craver, MD;  Location: [facility] ENDOSCOPY;  Service: Gastroenterology;  Laterality: N/A;  ? EYE SURGERY  Left eye  ? as a child  ? FEMORAL-POPLITEAL BYPASS GRAFT Right 12/08/2020  ? Procedure: RIGHT FEMORAL-BELOW KNEE POPLITEAL ARTERY BYPASS GRAFT;  Surgeon: Angelia Mould, MD;  Location: [place];  Service: Vascular;  Laterality: Right;  ? LEFT HEART CATH AND CORONARY ANGIOGRAPHY N/A 06/01/2021  ? Procedure: LEFT HEART CATH AND CORONARY ANGIOGRAPHY;  Surgeon: Burnell Blanks, MD;  Location: [facility] CV LAB;  Service: Cardiovascular;  Laterality: N/A;  ? LUMBAR SPINE SURGERY    ? 3 x   ? ? ?Current Medications: ?Outpatient Medications Prior to Visit  ?Medication Sig Dispense Refill  ? albuterol (ACCUNEB) 0.63 MG/3ML nebulizer solution Take 1 ampule by nebulization every 6 (six) hours as needed for wheezing or shortness of breath.    ? albuterol (PROVENTIL HFA;VENTOLIN HFA) 108 (90 BASE) MCG/ACT inhaler Inhale 2 puffs into the lungs every 6 (six) hours as needed for wheezing.    ? allopurinol (ZYLOPRIM) 100 MG tablet Take 100 mg by mouth in the morning.    ? ALPRAZolam (XANAX) 0.25 MG tablet Take 0.25 mg by mouth at bedtime as needed for anxiety.    ? aspirin EC 81 MG tablet Take 1 tablet (81 mg total) by mouth daily. Swallow whole. 150 tablet 2  ? buPROPion (WELLBUTRIN XL) 150 MG 24 hr tablet Take 1 tablet (150 mg total) by mouth every other day for 8 days, THEN 1 tablet (150 mg total) every 3 (three) days for 6 days. Then stop.. 6 tablet 0  ? calcium carbonate (TUMS - DOSED IN MG ELEMENTAL CALCIUM) 500 MG chewable tablet Chew 1 tablet by mouth daily.    ? clopidogrel (PLAVIX) 75 MG tablet Take 75 mg by mouth daily.    ? docusate sodium (COLACE) 100 MG capsule Take 100 mg by mouth 2 (two) times daily.    ? doxycycline (VIBRAMYCIN) 100 MG capsule Take 100 mg by mouth 2  (two) times daily.    ? DULoxetine (CYMBALTA) 20 MG capsule Take 1 capsule (20 mg total) by mouth daily. 30 capsule 0  ? feeding supplement (ENSURE ENLIVE / ENSURE PLUS) LIQD Take 237 mLs by mouth 3 (three) times daily between meals. 10000 mL 0  ? gabapentin (NEURONTIN) 100 MG capsule Take 1 capsule (100 mg total) by mouth 3 (three) times daily. 90 capsule 0  ? guaifenesin (HUMIBID E) 400 MG TABS tablet Take 400 mg by mouth every 4 (four) hours.    ? ipratropium-albuterol (DUONEB) 0.5-2.5 (3) MG/3ML SOLN Take 3 mLs by nebulization 2 (two) times daily. 360 mL 0  ? magnesium oxide (MAG-OX) 400 MG tablet Take 1 tablet by mouth daily.    ? metFORMIN (GLUCOPHAGE) 500 MG tablet Take 2 tablets by mouth 2 (two) times daily.    ? metoprolol tartrate (LOPRESSOR) 25 MG tablet Take 0.5 tablets (  12.5 mg total) by mouth 2 (two) times daily. 30 tablet 6  ? midodrine (PROAMATINE) 2.5 MG tablet Take 1 tablet (2.5 mg total) by mouth 3 (three) times daily with meals. 90 tablet 0  ? Multiple Vitamin (MULTIVITAMIN WITH MINERALS) TABS tablet Take 1 tablet by mouth daily. 120 tablet 0  ? nicotine (NICODERM CQ - DOSED IN MG/24 HOURS) 21 mg/24hr patch Place 1 patch (21 mg total) onto the skin daily. 28 patch 0  ? nitroGLYCERIN (NITROSTAT) 0.4 MG SL tablet Place 1 tablet (0.4 mg total) under the tongue every 5 (five) minutes x 3 doses as needed for chest pain. 30 tablet 0  ? omeprazole (PRILOSEC) 40 MG capsule Take 40 mg by mouth daily.    ? oxyCODONE-acetaminophen (PERCOCET/ROXICET) 5-325 MG tablet Take 1 tablet by mouth every 4 (four) hours as needed for moderate pain or severe pain. 30 tablet 0  ? pantoprazole (PROTONIX) 40 MG tablet Take 1 tablet (40 mg total) by mouth 2 (two) times daily before a meal. 120 tablet 0  ? phenazopyridine (PYRIDIUM) 100 MG tablet Take 100 mg by mouth 3 (three) times daily as needed for pain.    ? predniSONE (DELTASONE) 10 MG tablet Take 10 mg by mouth daily with breakfast.    ? predniSONE (DELTASONE) 20 MG  tablet Take 20 mg by mouth daily with breakfast.    ? rosuvastatin (CRESTOR) 40 MG tablet Take 1 tablet (40 mg total) by mouth daily. 30 tablet 0  ? saccharomyces boulardii (FLORASTOR) 250 MG capsule Take 1 capsule (250 mg tot

## 2021-07-07 DIAGNOSIS — I509 Heart failure, unspecified: Secondary | ICD-10-CM | POA: Diagnosis not present

## 2021-07-08 DIAGNOSIS — I10 Essential (primary) hypertension: Secondary | ICD-10-CM | POA: Diagnosis not present

## 2021-07-08 DIAGNOSIS — E1165 Type 2 diabetes mellitus with hyperglycemia: Secondary | ICD-10-CM | POA: Diagnosis not present

## 2021-07-10 ENCOUNTER — Ambulatory Visit: Payer: Medicare HMO | Admitting: Urology

## 2021-07-11 DIAGNOSIS — L89152 Pressure ulcer of sacral region, stage 2: Secondary | ICD-10-CM | POA: Diagnosis not present

## 2021-07-11 DIAGNOSIS — L97412 Non-pressure chronic ulcer of right heel and midfoot with fat layer exposed: Secondary | ICD-10-CM | POA: Diagnosis not present

## 2021-07-13 ENCOUNTER — Ambulatory Visit (INDEPENDENT_AMBULATORY_CARE_PROVIDER_SITE_OTHER): Payer: Medicare HMO | Admitting: Urology

## 2021-07-13 ENCOUNTER — Encounter: Payer: Self-pay | Admitting: Urology

## 2021-07-13 VITALS — BP 88/51 | HR 76

## 2021-07-13 DIAGNOSIS — R339 Retention of urine, unspecified: Secondary | ICD-10-CM | POA: Diagnosis not present

## 2021-07-13 NOTE — Progress Notes (Signed)
? ?07/13/2021 ?9:59 AM  ? ?Raymond [name] ?09/12/1960 ?[id number] ? ?Referring provider: [name], MD ?[address] ?[place],  [place] [address] ? ?Urinary retention ? ? ?HPI: ?Raymond [name] is a 61yo here for evaluation of urinary retention. He is currently in a wheelchair. He was admitted over 1 month ago for an MI and then he had a CVA. He was discharged on 4/7 to the [facility] and shortly after admission he developed an inability to urinate. A foley was placed at that time. He has been on flomax for several years. He denies any urinary urgency or a feeling he needs to urinate. He has a sore on his penis that his his aid found while changing him.  ? ? ?PMH: ?Past Medical History:  ?Diagnosis Date  ? Anxiety   ? Back pain   ? Bipolar disorder ([place])   ? Borderline hypertension   ? Chronic pain   ? COPD (chronic obstructive pulmonary disease) ([place])   ? Depression with anxiety   ? GERD (gastroesophageal reflux disease)   ? Headache   ? Hip pain   ? Hypercholesterolemia   ? Right hemisphere, cerebral infarction ([place]) 06/08/2021  ? Stroke ([place]) 11/10/2013  ? Tobacco use   ? ? ?Surgical History: ?Past Surgical History:  ?Procedure Laterality Date  ? AMPUTATION Right 12/08/2020  ? Procedure: RAY AMPUTATION OF RIGHT FIFTH TOE;  Surgeon: Angelia Mould, MD;  Location: [facility] OR;  Service: Vascular;  Laterality: Right;  ? ANGIOPLASTY ILLIAC ARTERY Right 12/08/2020  ? Procedure: ILIAC ANGIOPLASTY WITH INSERTION OF RIGHT COMMON ILIAC 8MM X 79MM VIABAHN STENT;  Surgeon: Angelia Mould, MD;  Location: [place];  Service: Vascular;  Laterality: Right;  ? ANTERIOR FUSION CERVICAL SPINE    ? BACK SURGERY    ? BIOPSY  06/06/2021  ? Procedure: BIOPSY;  Surgeon: Juanita Craver, MD;  Location: [facility] ENDOSCOPY;  Service: Gastroenterology;;  ? CHOLECYSTECTOMY    ? COLONOSCOPY N/A 10/28/2012  ? POE:UMPNTI polyp-hyperplastic. Internal hemorrhoids. Distal 5 cm of terminal ileum appeared normal.  ? CORONARY ANGIOPLASTY WITH STENT  PLACEMENT  1980s  ? ENDARTERECTOMY FEMORAL Right 12/08/2020  ? Procedure: RIGHT FEMORAL ENDARTERECTOMY WITH ILIOFEMORAL THROMBECTOMY;  Surgeon: Angelia Mould, MD;  Location: [facility] OR;  Service: Vascular;  Laterality: Right;  ? ESOPHAGOGASTRODUODENOSCOPY N/A 10/28/2012  ? RWE:[id number] gastric mucosa with mottling and submucosal petechiae. Mild chronic gastritis but no H. pylori home path. Small hiatal hernia. Duodenal lipoma  ? ESOPHAGOGASTRODUODENOSCOPY (EGD) WITH PROPOFOL N/A 06/06/2021  ? Procedure: ESOPHAGOGASTRODUODENOSCOPY (EGD) WITH PROPOFOL;  Surgeon: Juanita Craver, MD;  Location: [facility] ENDOSCOPY;  Service: Gastroenterology;  Laterality: N/A;  ? EYE SURGERY  Left eye  ? as a child  ? FEMORAL-POPLITEAL BYPASS GRAFT Right 12/08/2020  ? Procedure: RIGHT FEMORAL-BELOW KNEE POPLITEAL ARTERY BYPASS GRAFT;  Surgeon: Angelia Mould, MD;  Location: [place];  Service: Vascular;  Laterality: Right;  ? LEFT HEART CATH AND CORONARY ANGIOGRAPHY N/A 06/01/2021  ? Procedure: LEFT HEART CATH AND CORONARY ANGIOGRAPHY;  Surgeon: Burnell Blanks, MD;  Location: [place] CV LAB;  Service: Cardiovascular;  Laterality: N/A;  ? LUMBAR SPINE SURGERY    ? 3 x   ? ? ?Home Medications:  ?Allergies as of 07/13/2021   ? ?   Reactions  ? Morphine And Related Shortness Of Breath, Palpitations  ? Penicillins Anaphylaxis  ? Vicodin [hydrocodone-acetaminophen] Shortness Of Breath, Palpitations  ? Other   ? Latex Hives, Rash  ? ?  ? ?  ?  Medication List  ?  ? ?  ? Accurate as of Jul 13, 2021  9:59 AM. If you have any questions, ask your nurse or doctor.  ?  ?  ? ?  ? ?albuterol 0.63 MG/3ML nebulizer solution ?Commonly known as: ACCUNEB ?Take 1 ampule by nebulization every 6 (six) hours as needed for wheezing or shortness of breath. ?  ?albuterol 108 (90 Base) MCG/ACT inhaler ?Commonly known as: VENTOLIN HFA ?Inhale 2 puffs into the lungs every 6 (six) hours as needed for wheezing. ?  ?allopurinol 100 MG tablet ?Commonly known as:  ZYLOPRIM ?Take 100 mg by mouth in the morning. ?  ?ALPRAZolam 0.25 MG tablet ?Commonly known as: Duanne Moron ?Take 0.25 mg by mouth at bedtime as needed for anxiety. ?  ?aspirin EC 81 MG tablet ?Take 1 tablet (81 mg total) by mouth daily. Swallow whole. ?  ?buPROPion 150 MG 24 hr tablet ?Commonly known as: WELLBUTRIN XL ?Take 1 tablet (150 mg total) by mouth every other day for 8 days, THEN 1 tablet (150 mg total) every 3 (three) days for 6 days. Then stop.[name] ?Start taking on: June 15, 2021 ?  ?calcium carbonate 500 MG chewable tablet ?Commonly known as: TUMS - dosed in mg elemental calcium ?Chew 1 tablet by mouth daily. ?  ?clopidogrel 75 MG tablet ?Commonly known as: PLAVIX ?Take 75 mg by mouth daily. ?  ?diclofenac 50 MG EC tablet ?Commonly known as: VOLTAREN ?Take 50 mg by mouth 3 (three) times daily. ?  ?docusate sodium 100 MG capsule ?Commonly known as: COLACE ?Take 100 mg by mouth 2 (two) times daily. ?  ?doxycycline 100 MG capsule ?Commonly known as: VIBRAMYCIN ?Take 100 mg by mouth 2 (two) times daily. ?  ?DULoxetine 20 MG capsule ?Commonly known as: Cymbalta ?Take 1 capsule (20 mg total) by mouth daily. ?  ?feeding supplement Liqd ?Take 237 mLs by mouth 3 (three) times daily between meals. ?  ?nutrition supplement (JUVEN) Pack ?Take 1 packet by mouth 2 (two) times daily between meals. ?  ?furosemide 20 MG tablet ?Commonly known as: LASIX ?Take 20 mg by mouth daily as needed. ?  ?gabapentin 100 MG capsule ?Commonly known as: NEURONTIN ?Take 1 capsule (100 mg total) by mouth 3 (three) times daily. ?  ?guaifenesin 400 MG Tabs tablet ?Commonly known as: HUMIBID E ?Take 400 mg by mouth every 4 (four) hours. ?  ?ipratropium-albuterol 0.5-2.5 (3) MG/3ML Soln ?Commonly known as: DUONEB ?Take 3 mLs by nebulization 2 (two) times daily. ?  ?magnesium oxide 400 MG tablet ?Commonly known as: MAG-OX ?Take 1 tablet by mouth daily. ?  ?metFORMIN 500 MG tablet ?Commonly known as: GLUCOPHAGE ?Take 2 tablets by mouth 2 (two) times  daily. ?  ?metoCLOPramide 5 MG tablet ?Commonly known as: REGLAN ?Take 1 tablet (5 mg total) by mouth every 8 (eight) hours as needed for nausea or vomiting. ?  ?metoprolol tartrate 25 MG tablet ?Commonly known as: LOPRESSOR ?Take 0.5 tablets (12.5 mg total) by mouth 2 (two) times daily. ?  ?midodrine 2.5 MG tablet ?Commonly known as: PROAMATINE ?Take 1 tablet (2.5 mg total) by mouth 3 (three) times daily with meals. ?  ?multivitamin with minerals Tabs tablet ?Take 1 tablet by mouth daily. ?  ?nicotine 21 mg/24hr patch ?Commonly known as: NICODERM CQ - dosed in mg/24 hours ?Place 1 patch (21 mg total) onto the skin daily. ?  ?nitroGLYCERIN 0.4 MG SL tablet ?Commonly known as: NITROSTAT ?Place 1 tablet (0.4 mg total) under the tongue every  5 (five) minutes x 3 doses as needed for chest pain. ?  ?omeprazole 40 MG capsule ?Commonly known as: PRILOSEC ?Take 40 mg by mouth daily. ?  ?oxyCODONE-acetaminophen 5-325 MG tablet ?Commonly known as: PERCOCET/ROXICET ?Take 1 tablet by mouth every 4 (four) hours as needed for moderate pain or severe pain. ?  ?pantoprazole 40 MG tablet ?Commonly known as: PROTONIX ?Take 1 tablet (40 mg total) by mouth 2 (two) times daily before a meal. ?  ?phenazopyridine 100 MG tablet ?Commonly known as: PYRIDIUM ?Take 100 mg by mouth 3 (three) times daily as needed for pain. ?  ?predniSONE 10 MG tablet ?Commonly known as: DELTASONE ?Take 10 mg by mouth daily with breakfast. ?  ?predniSONE 20 MG tablet ?Commonly known as: DELTASONE ?Take 20 mg by mouth daily with breakfast. ?  ?rosuvastatin 40 MG tablet ?Commonly known as: CRESTOR ?Take 1 tablet (40 mg total) by mouth daily. ?  ?saccharomyces boulardii 250 MG capsule ?Commonly known as: FLORASTOR ?Take 1 capsule (250 mg total) by mouth 2 (two) times daily. ?  ?sertraline 50 MG tablet ?Commonly known as: ZOLOFT ?Take 50 mg by mouth daily. ?  ?silver sulfADIAZINE 1 % cream ?Commonly known as: SILVADENE ?Apply 1 application topically 2 (two) times  daily. apply a 1/16 inch (1.5 mm) thick layer to entire burn area ?  ?tamsulosin 0.4 MG Caps capsule ?Commonly known as: FLOMAX ?Take 0.4 mg by mouth in the morning. ?  ?torsemide 20 MG tablet ?Commonly known as

## 2021-07-13 NOTE — Patient Instructions (Signed)
Acute Urinary Retention, Male  Acute urinary retention is when a person cannot pee (urinate) at all, or can only pee a little. This can come on all of a sudden. If it is not treated, it can lead to kidney problems or other serious problems. What are the causes? A problem with the tube that drains the bladder (urethra). Problems with the nerves in the bladder. Tumors. Certain medicines. An infection. Having trouble pooping (constipation). What increases the risk? Older men are more at risk because their prostate gland may become larger as they age. Other conditions also can increase risk. These include: Diseases, such as multiple sclerosis. Injury to the spinal cord. Diabetes. A condition that affects the way the brain works, such as dementia. Holding back urine due to trauma or because you do not want to use the bathroom. What are the signs or symptoms? Trouble peeing. Pain in the lower belly. How is this treated? Treatment for this condition may include: Medicines. Placing a thin, germ-free tube (catheter) into the bladder to drain pee out of the body. Therapy to treat mental health conditions. Treatment for conditions that may cause this. If needed, you may be treated in the hospital for kidney problems or to manage other problems. Follow these instructions at home: Medicines Take over-the-counter and prescription medicines only as told by your doctor. Ask your doctor what medicines you should stay away from. If you were given an antibiotic medicine, take it as told by your doctor. Do not stop taking it, even if you start to feel better. General instructions Do not smoke or use any products that contain nicotine or tobacco. If you need help quitting, ask your doctor. Drink enough fluid to keep your pee pale yellow. If you were sent home with a tube that drains the bladder, take care of it as told by your doctor. Watch for changes in your symptoms. Tell your doctor about them. If  told, keep track of changes in your blood pressure at home. Tell your doctor about them. Keep all follow-up visits. Contact a doctor if: You have spasms in your bladder that you cannot stop. You leak pee when you have spasms. Get help right away if: You have chills or a fever. You have blood in your pee. You have a tube that drains pee from the bladder and these things happen: The tube stops draining pee. The tube falls out. Summary Acute urinary retention is when you cannot pee at all or you pee too little. If this condition is not treated, it can lead to kidney problems or other serious problems. If you were sent home with a tube (catheter) that drains the bladder, take care of it as told by your doctor. Watch for changes in your symptoms. Tell your doctor about them. This information is not intended to replace advice given to you by your health care provider. Make sure you discuss any questions you have with your health care provider. Document Revised: 11/17/2019 Document Reviewed: 11/17/2019 Elsevier Patient Education  2023 Elsevier Inc.  

## 2021-07-16 DIAGNOSIS — R369 Urethral discharge, unspecified: Secondary | ICD-10-CM | POA: Diagnosis not present

## 2021-07-16 DIAGNOSIS — E119 Type 2 diabetes mellitus without complications: Secondary | ICD-10-CM | POA: Diagnosis not present

## 2021-07-16 DIAGNOSIS — Z960 Presence of urogenital implants: Secondary | ICD-10-CM | POA: Diagnosis not present

## 2021-07-16 DIAGNOSIS — R339 Retention of urine, unspecified: Secondary | ICD-10-CM | POA: Diagnosis not present

## 2021-07-18 DIAGNOSIS — L89152 Pressure ulcer of sacral region, stage 2: Secondary | ICD-10-CM | POA: Diagnosis not present

## 2021-07-23 DIAGNOSIS — G959 Disease of spinal cord, unspecified: Secondary | ICD-10-CM | POA: Diagnosis not present

## 2021-07-25 ENCOUNTER — Telehealth: Payer: Self-pay | Admitting: Cardiology

## 2021-07-25 DIAGNOSIS — L89152 Pressure ulcer of sacral region, stage 2: Secondary | ICD-10-CM | POA: Diagnosis not present

## 2021-07-26 NOTE — Telephone Encounter (Signed)
   Patient Name: Raymond [name]  DOB: 1960-05-10 MRN: [id number]  Primary Cardiologist: Carlyle Dolly, MD  Chart reviewed as part of pre-operative protocol coverage. Patient was just recently seen by Bernerd Pho 07/05/21, excellent note reviewed. Her note indicates patient is on Plavix for h/o PAD (followed by Dr. Scot Dock with [facility]). We are now asked to provide recs on cervical fusion, will route to [place] to weigh in - recent visit outlines that patient is mostly in wheelchair and resides in SNF therefore a traditional virtual call is unlikely to be helpful for clearing.   Charlie Pitter, PA-C 07/26/2021, 11:44 AM

## 2021-07-26 NOTE — Telephone Encounter (Signed)
   Patient Name: Raymond [name]  DOB: 1960/03/13 MRN: [id number]  Primary Cardiologist: Carlyle Dolly, MD  Chart reviewed as part of pre-operative protocol coverage. As below, reviewed with PA [place] who recently evaluated patient in clinic. Per her recommendations, "While unable to access METS given his current functional status, recent LHC in 05/2021 showed moderate CAD but no flow limiting lesions with medical management recommended. Echo showed a preserved EF of 55-60% with no regional WMA. He would be of acceptable risk to proceed from a cardiac perspective without the indication for further testing given recent workup. Would defer holding Plavix to [facility]."  Will route this bundled recommendation to requesting provider via Epic fax function. Please call with questions.   Charlie Pitter, PA-C 07/26/2021, 12:52 PM

## 2021-07-26 NOTE — Telephone Encounter (Signed)
    While unable to access METS given his current functional status, recent LHC in 05/2021 showed moderate CAD but no flow limiting lesions with medical management recommended. Echo showed a preserved EF of 55-60% with no regional WMA. He would be of acceptable risk to proceed from a cardiac perspective without the indication for further testing given recent workup. Would defer holding Plavix to [facility].  Signed, Erma Heritage, PA-C 07/26/2021, 12:15 PM

## 2021-08-07 ENCOUNTER — Inpatient Hospital Stay: Payer: Medicare Other | Admitting: Neurology

## 2021-08-09 NOTE — Telephone Encounter (Signed)
? ?  Pre-operative Risk Assessment  ?  ?Patient Name: Raymond [name]  ?DOB: 21-Jul-1960 ?MRN: [id number]  ? ?  ? ?Request for Surgical Clearance   ? ?Procedure:   Cervical Fusion ? ?Date of Surgery:  Clearance TBD                              ?   ?Surgeon:  Dr. Pieter Partridge Dawley  ?Surgeon's Group or Practice Name:  [facility]  ?Phone number:  [phone] ?Fax number:  [phone] ?  ?Type of Clearance Requested:   ?Not indicated  ?  ?Type of Anesthesia:  General  ?  ?Additional requests/questions:   ? ?Signed, ?Malanie C Hildebrandt   ?2021-08-01, 11:57 AM  ? ?

## 2021-08-09 DEATH — deceased

## 2021-10-17 ENCOUNTER — Ambulatory Visit: Payer: Medicare HMO | Admitting: Physician Assistant

## 2022-01-10 ENCOUNTER — Ambulatory Visit: Payer: Medicare HMO | Admitting: Cardiology

## 2022-05-14 IMAGING — DX DG CHEST 1V PORT
1 series · 1 of 1 positions shown · non-contrast
Comparison: 06/08/2021

CLINICAL DATA: Pleural effusion.

EXAM:
PORTABLE CHEST 1 VIEW

[chest]
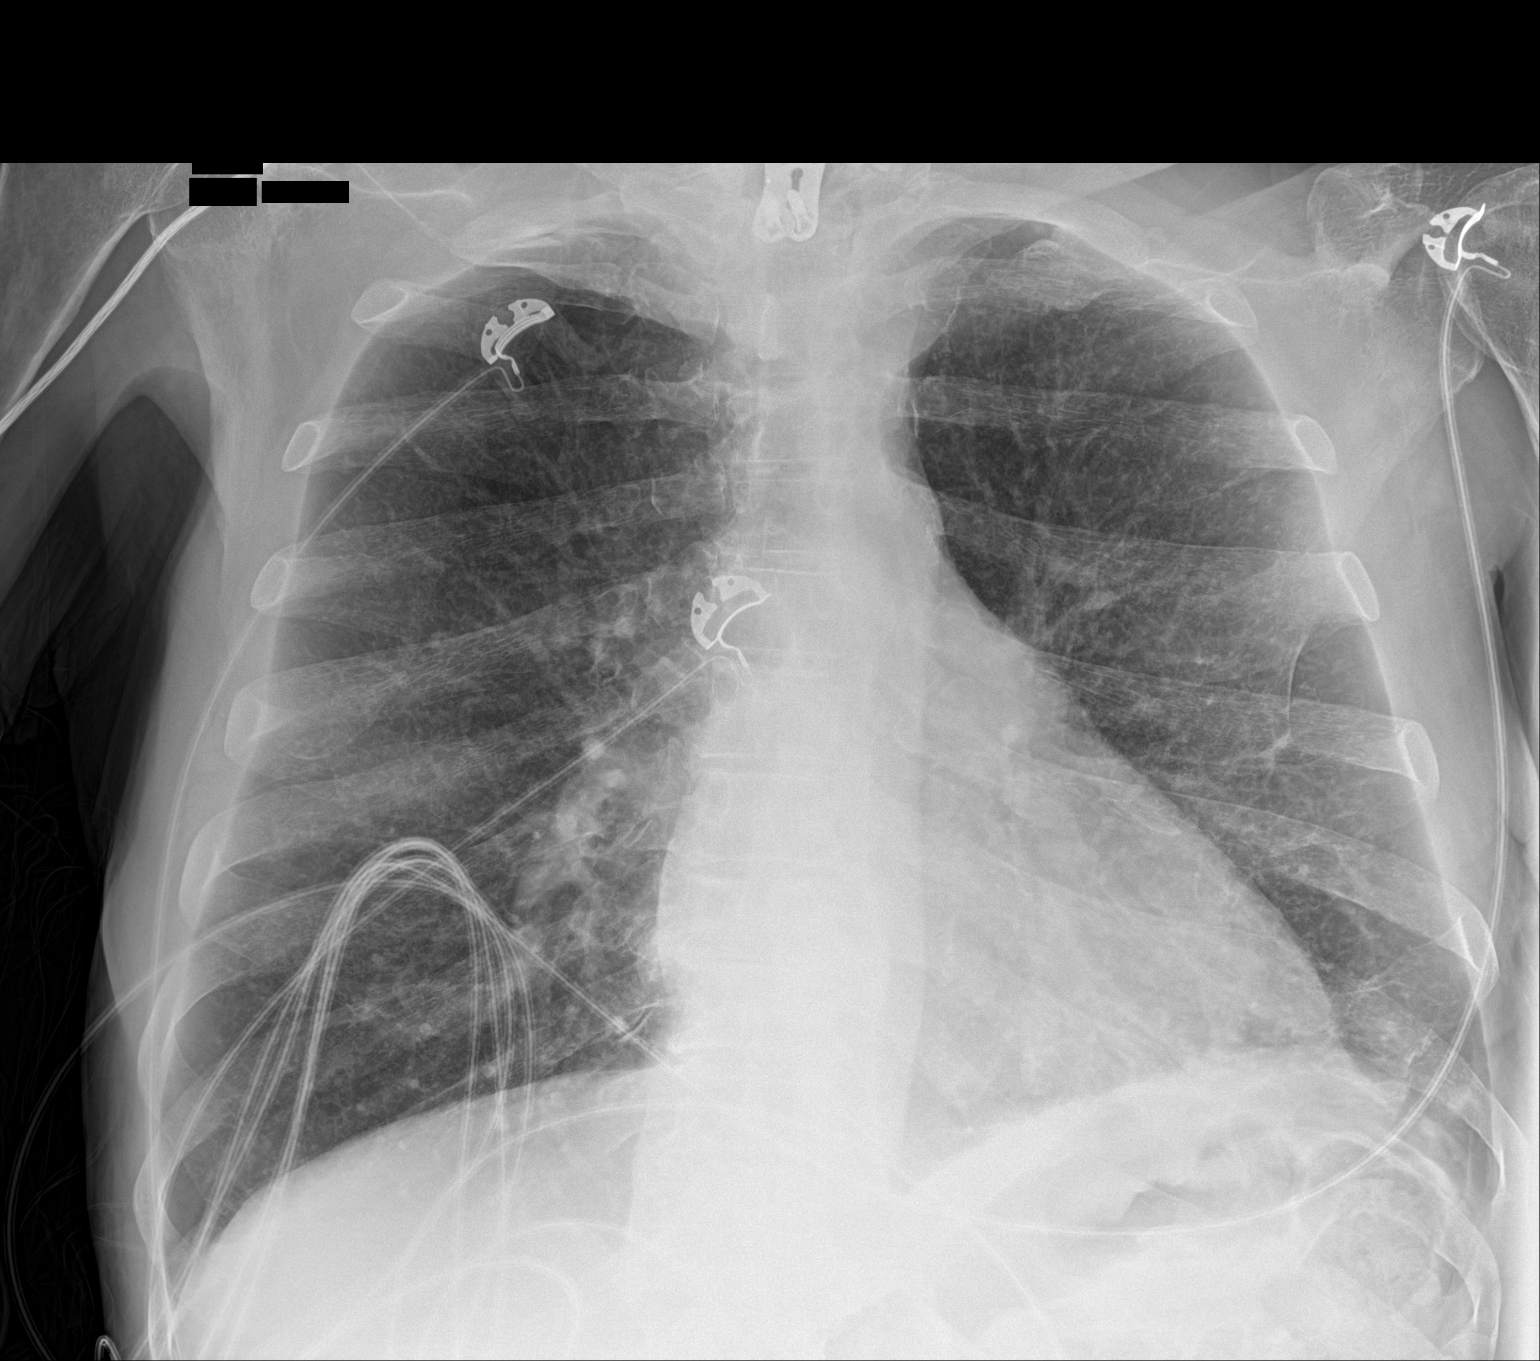

[1 of 1 positions shown; findings below may reference images not displayed]

FINDINGS: Lordotic technique is demonstrated. Lungs are adequately inflated
with interval improvement in left base opacification likely
improving effusion/atelectasis. Remainder of the lungs are clear.
Cardiomediastinal silhouette and remainder of the exam is unchanged.
IMPRESSION: Improving left base opacification likely improving
effusion/atelectasis.
